# Patient Record
Sex: Male | Born: 1955 | Race: White | Hispanic: No | Marital: Married | Smoking: Former smoker
Health system: Southern US, Community
[De-identification: ages and names within clinical notes are randomized; demographics above are authoritative.]

## PROBLEM LIST (undated history)

## (undated) DIAGNOSIS — I2510 Atherosclerotic heart disease of native coronary artery without angina pectoris: Secondary | ICD-10-CM

## (undated) DIAGNOSIS — I447 Left bundle-branch block, unspecified: Secondary | ICD-10-CM

## (undated) DIAGNOSIS — E0590 Thyrotoxicosis, unspecified without thyrotoxic crisis or storm: Secondary | ICD-10-CM

## (undated) DIAGNOSIS — F419 Anxiety disorder, unspecified: Secondary | ICD-10-CM

## (undated) DIAGNOSIS — I4891 Unspecified atrial fibrillation: Secondary | ICD-10-CM

## (undated) DIAGNOSIS — I5020 Unspecified systolic (congestive) heart failure: Secondary | ICD-10-CM

## (undated) DIAGNOSIS — I10 Essential (primary) hypertension: Secondary | ICD-10-CM

## (undated) DIAGNOSIS — E785 Hyperlipidemia, unspecified: Secondary | ICD-10-CM

## (undated) HISTORY — DX: Thyrotoxicosis, unspecified without thyrotoxic crisis or storm: E05.90

## (undated) HISTORY — DX: Left bundle-branch block, unspecified: I44.7

## (undated) HISTORY — DX: Unspecified systolic (congestive) heart failure: I50.20

## (undated) HISTORY — DX: Atherosclerotic heart disease of native coronary artery without angina pectoris: I25.10

---

## 1978-04-02 HISTORY — PX: FACIAL FRACTURE SURGERY: SHX1570

## 2016-02-07 DIAGNOSIS — H5022 Vertical strabismus, left eye: Secondary | ICD-10-CM | POA: Insufficient documentation

## 2016-02-07 DIAGNOSIS — H5015 Alternating exotropia: Secondary | ICD-10-CM | POA: Insufficient documentation

## 2016-02-07 DIAGNOSIS — H532 Diplopia: Secondary | ICD-10-CM | POA: Insufficient documentation

## 2016-11-26 ENCOUNTER — Ambulatory Visit (INDEPENDENT_AMBULATORY_CARE_PROVIDER_SITE_OTHER): Payer: 59 | Admitting: Podiatry

## 2016-11-26 ENCOUNTER — Ambulatory Visit (INDEPENDENT_AMBULATORY_CARE_PROVIDER_SITE_OTHER): Payer: 59

## 2016-11-26 ENCOUNTER — Encounter: Payer: Self-pay | Admitting: Podiatry

## 2016-11-26 DIAGNOSIS — M779 Enthesopathy, unspecified: Secondary | ICD-10-CM

## 2016-11-26 DIAGNOSIS — F419 Anxiety disorder, unspecified: Secondary | ICD-10-CM | POA: Insufficient documentation

## 2016-11-26 DIAGNOSIS — M7661 Achilles tendinitis, right leg: Secondary | ICD-10-CM

## 2016-11-26 MED ORDER — MELOXICAM 15 MG PO TABS: 15.0000 mg | ORAL_TABLET | Freq: Every day | ORAL | 3 refills | Status: DC

## 2016-11-26 MED ORDER — METHYLPREDNISOLONE 4 MG PO TBPK: ORAL_TABLET | ORAL | 0 refills | Status: DC

## 2016-11-26 NOTE — Progress Notes (Signed)
   Subjective:    Patient ID: Tommy Rowe, male    DOB: [birth year], 61 y.o.   MRN: [id number]  HPI he presents today with a chief complaint of a painful Achilles tendon for about 4 months. He states that this Achilles tendon on the right side has been painful with a small nodule for about 4 months he's noticed swelling. He states that he is still active with golf and daily activities he feels tendon feels tight and stiff particularly in the mornings or after his been sitting for wall. He's tried heat and ice and KT tape strips.    Review of Systems  All other systems reviewed and are negative.      Objective:   Physical Exam: Vital signs are stable alert and oriented 3. Pulses are palpable. Neurologic sensorium is intact. Deep tendon reflexes are intact. Muscle strength 5 over 5 dorsiflexion plantar flexors and inverters and everters OF the musculature is intact. Orthopedic evaluation was resulted joints distal to the ankle range of motion without crepitation. Cutaneous evaluation of straight supple well-hydrated cutis no open lesions or wounds. He does have a palpable nonpulsatile nodule to the Achilles tendon in the watershed area. Radiographs do demonstrate retrocalcaneal heel spur thickening of the Achilles tendon even distal to the nodule. The tendon appears to be tender. More than likely this is a chronic tendinitis that has resulted in an interstitial tear.        Assessment & Plan:  Probable interstitial. Tear of chronic Achilles tendinitis right.  Plan: Start him on a Medrol Dosepak to be followed by meloxicam. Also placed him in a Cam Walker and suggested ice therapy. Follow up with him in 1 month they need to consider topical anti-inflammatory. Also dispensed night splint.

## 2017-01-07 ENCOUNTER — Encounter (INDEPENDENT_AMBULATORY_CARE_PROVIDER_SITE_OTHER): Payer: 59 | Admitting: Podiatry

## 2017-01-07 NOTE — Progress Notes (Signed)
This encounter was created in error - please disregard.

## 2019-08-13 ENCOUNTER — Other Ambulatory Visit: Payer: Self-pay | Admitting: Neurosurgery

## 2019-08-18 ENCOUNTER — Other Ambulatory Visit: Payer: Self-pay

## 2019-08-18 ENCOUNTER — Encounter
Admission: RE | Admit: 2019-08-18 | Discharge: 2019-08-18 | Disposition: A | Discharge status: Home / Self Care | Payer: Managed Care, Other (non HMO) | Source: Ambulatory Visit | Attending: Neurosurgery | Admitting: Neurosurgery

## 2019-08-18 DIAGNOSIS — Z01818 Encounter for other preprocedural examination: Secondary | ICD-10-CM | POA: Diagnosis not present

## 2019-08-18 HISTORY — DX: Anxiety disorder, unspecified: F41.9

## 2019-08-18 NOTE — Patient Instructions (Signed)
Your procedure is scheduled on: Wednesday Aug 26, 2019 Report to Day Surgery. To find out your arrival time please call [phone] between 1PM - 3PM on Tuesday Aug 25, 2019.  Remember: Instructions that are not followed completely may result in serious medical risk,  up to and including death, or upon the discretion of your surgeon and anesthesiologist your  surgery may need to be rescheduled.     _X__ 1. Do not eat food after midnight the night before your procedure.                 No gum chewing or hard candies. You may drink clear liquids up to 2 hours                 before you are scheduled to arrive for your surgery- DO not drink clear                 liquids within 2 hours of the start of your surgery.                 Clear Liquids include:  water, apple juice without pulp, clear Gatorade, G2 or                  Gatorade Zero (avoid Red/Purple/Blue), Black Coffee or Tea (Do not add                 anything to coffee or tea).  __X__2.  On the morning of surgery brush your teeth with toothpaste and water, you                may rinse your mouth with mouthwash if you wish.  Do not swallow any toothpaste of mouthwash.     _X__ 3.  No Alcohol for 24 hours before or after surgery.   _X__ 4.  Do Not Smoke or use e-cigarettes For 24 Hours Prior to Your Surgery.                 Do not use any chewable tobacco products for at least 6 hours prior to                 Surgery.  _X__  5.  Do not use any recreational drugs (marijuana, cocaine, heroin, ecstacy, MDMA or other)                For at least one week prior to your surgery.  Combination of these drugs with anesthesia                May have life threatening results.  __X__ 6.  Notify your doctor if there is any change in your medical condition      (cold, fever, infections).     Do not wear jewelry, make-up, hairpins, clips or nail polish. Do not wear lotions, powders, or perfumes. You may wear  deodorant. Do not shave 48 hours prior to surgery. Men may shave face and neck. Do not bring valuables to the hospital.    [facility] is not responsible for any belongings or valuables.  Contacts, dentures or bridgework may not be worn into surgery. Leave your suitcase in the car. After surgery it may be brought to your room. For patients admitted to the hospital, discharge time is determined by your treatment team.   Patients discharged the day of surgery will not be allowed to drive home.   Make arrangements for someone to be with you for the first  24 hours of your Same Day Discharge.    Please read over the following fact sheets that you were given:   Spine Surgery Patient Guide    __X__ Take these medicines the morning of surgery with A SIP OF WATER:    1. escitalopram (LEXAPRO) 20 MG  __X__ Use CHG Soap (or wipes) as directed  __X__ Stop aspirin on 08/18/2019  __X__ Stop Anti-inflammatories naproxen sodium (ALEVE)   __X__ Stop supplements until after surgery.    __X__ Do not start any herbal supplements before your surgery.

## 2019-08-21 ENCOUNTER — Other Ambulatory Visit: Payer: Self-pay

## 2019-08-21 ENCOUNTER — Encounter
Admission: RE | Admit: 2019-08-21 | Discharge: 2019-08-21 | Disposition: A | Discharge status: Home / Self Care | Payer: Managed Care, Other (non HMO) | Source: Ambulatory Visit | Attending: Neurosurgery | Admitting: Neurosurgery

## 2019-08-21 DIAGNOSIS — Z01818 Encounter for other preprocedural examination: Secondary | ICD-10-CM | POA: Diagnosis not present

## 2019-08-21 LAB — CBC
HCT: 43 % (ref 39.0–52.0)
Hemoglobin: 15.1 g/dL (ref 13.0–17.0)
MCH: 31.2 pg (ref 26.0–34.0)
MCHC: 35.1 g/dL (ref 30.0–36.0)
MCV: 88.8 fL (ref 80.0–100.0)
Platelets: 170 10*3/uL (ref 150–400)
RBC: 4.84 MIL/uL (ref 4.22–5.81)
RDW: 12.4 % (ref 11.5–15.5)
WBC: 4.8 10*3/uL (ref 4.0–10.5)
nRBC: 0 % (ref 0.0–0.2)

## 2019-08-21 LAB — URINALYSIS, ROUTINE W REFLEX MICROSCOPIC
Bilirubin Urine: NEGATIVE
Glucose, UA: NEGATIVE mg/dL
Hgb urine dipstick: NEGATIVE
Ketones, ur: NEGATIVE mg/dL
Leukocytes,Ua: NEGATIVE
Nitrite: NEGATIVE
Protein, ur: NEGATIVE mg/dL
Specific Gravity, Urine: 1.031 — ABNORMAL HIGH (ref 1.005–1.030)
pH: 5 (ref 5.0–8.0)

## 2019-08-21 LAB — PROTIME-INR
INR: 1 (ref 0.8–1.2)
Prothrombin Time: 12.6 seconds (ref 11.4–15.2)

## 2019-08-21 LAB — BASIC METABOLIC PANEL
Anion gap: 7 (ref 5–15)
BUN: 32 mg/dL — ABNORMAL HIGH (ref 8–23)
CO2: 28 mmol/L (ref 22–32)
Calcium: 8.9 mg/dL (ref 8.9–10.3)
Chloride: 105 mmol/L (ref 98–111)
Creatinine, Ser: 0.62 mg/dL (ref 0.61–1.24)
GFR calc Af Amer: >60 mL/min (ref >60)
GFR calc non Af Amer: >60 mL/min (ref >60)
Glucose, Bld: 113 mg/dL — ABNORMAL HIGH (ref 70–99)
Potassium: 3.8 mmol/L (ref 3.5–5.1)
Sodium: 140 mmol/L (ref 135–145)

## 2019-08-21 LAB — TYPE AND SCREEN
ABO/RH(D): A NEG
Antibody Screen: NEGATIVE

## 2019-08-21 LAB — SURGICAL PCR SCREEN
MRSA, PCR: POSITIVE — AB
Staphylococcus aureus: POSITIVE — AB

## 2019-08-21 LAB — APTT: aPTT: 28 seconds (ref 24–36)

## 2019-08-24 ENCOUNTER — Other Ambulatory Visit: Payer: Self-pay

## 2019-08-24 ENCOUNTER — Other Ambulatory Visit
Admission: RE | Admit: 2019-08-24 | Discharge: 2019-08-24 | Disposition: A | Discharge status: Home / Self Care | Payer: Managed Care, Other (non HMO) | Source: Ambulatory Visit | Attending: Neurosurgery | Admitting: Neurosurgery

## 2019-08-24 DIAGNOSIS — Z20822 Contact with and (suspected) exposure to covid-19: Secondary | ICD-10-CM | POA: Insufficient documentation

## 2019-08-24 DIAGNOSIS — Z01812 Encounter for preprocedural laboratory examination: Secondary | ICD-10-CM | POA: Insufficient documentation

## 2019-08-24 LAB — SARS CORONAVIRUS 2 (TAT 6-24 HRS): SARS Coronavirus 2: NEGATIVE

## 2019-08-25 ENCOUNTER — Encounter: Payer: Self-pay | Admitting: Neurosurgery

## 2019-08-26 ENCOUNTER — Other Ambulatory Visit: Payer: Self-pay

## 2019-08-26 ENCOUNTER — Observation Stay
Admission: RE | Admit: 2019-08-26 | Discharge: 2019-08-27 | Disposition: A | Discharge status: Home / Self Care | Payer: Managed Care, Other (non HMO) | Attending: Neurosurgery | Admitting: Neurosurgery

## 2019-08-26 ENCOUNTER — Ambulatory Visit: Payer: Managed Care, Other (non HMO) | Admitting: Anesthesiology

## 2019-08-26 ENCOUNTER — Encounter: Payer: Self-pay | Admitting: Neurosurgery

## 2019-08-26 ENCOUNTER — Encounter
Admission: RE | Disposition: A | Discharge status: Home / Self Care | Payer: Self-pay | Source: Home / Self Care | Attending: Neurosurgery

## 2019-08-26 ENCOUNTER — Ambulatory Visit: Payer: Managed Care, Other (non HMO)

## 2019-08-26 DIAGNOSIS — I447 Left bundle-branch block, unspecified: Secondary | ICD-10-CM | POA: Insufficient documentation

## 2019-08-26 DIAGNOSIS — Z7982 Long term (current) use of aspirin: Secondary | ICD-10-CM | POA: Insufficient documentation

## 2019-08-26 DIAGNOSIS — Z79899 Other long term (current) drug therapy: Secondary | ICD-10-CM | POA: Insufficient documentation

## 2019-08-26 DIAGNOSIS — M1990 Unspecified osteoarthritis, unspecified site: Secondary | ICD-10-CM | POA: Insufficient documentation

## 2019-08-26 DIAGNOSIS — M5416 Radiculopathy, lumbar region: Secondary | ICD-10-CM | POA: Diagnosis present

## 2019-08-26 DIAGNOSIS — Z419 Encounter for procedure for purposes other than remedying health state, unspecified: Secondary | ICD-10-CM

## 2019-08-26 DIAGNOSIS — M5116 Intervertebral disc disorders with radiculopathy, lumbar region: Principal | ICD-10-CM | POA: Insufficient documentation

## 2019-08-26 DIAGNOSIS — F419 Anxiety disorder, unspecified: Secondary | ICD-10-CM | POA: Insufficient documentation

## 2019-08-26 HISTORY — PX: LUMBAR LAMINECTOMY/DECOMPRESSION MICRODISCECTOMY: SHX5026

## 2019-08-26 LAB — ABO/RH: ABO/RH(D): A NEG

## 2019-08-26 SURGERY — LUMBAR LAMINECTOMY/DECOMPRESSION MICRODISCECTOMY 1 LEVEL
Anesthesia: General

## 2019-08-26 MED ORDER — MIDAZOLAM HCL 2 MG/2ML IJ SOLN
INTRAMUSCULAR | Status: DC | PRN
  Administered 2019-08-26: 2 mg via INTRAVENOUS

## 2019-08-26 MED ORDER — SUCCINYLCHOLINE CHLORIDE 20 MG/ML IJ SOLN
INTRAMUSCULAR | Status: DC | PRN
  Administered 2019-08-26: 120 mg via INTRAVENOUS

## 2019-08-26 MED ORDER — THROMBIN 5000 UNITS EX SOLR
CUTANEOUS | Status: DC | PRN
  Administered 2019-08-26: 5000 [IU] via TOPICAL

## 2019-08-26 MED ORDER — ONDANSETRON HCL 4 MG/2ML IJ SOLN: 4.0000 mg | Freq: Four times a day (QID) | INTRAMUSCULAR | Status: DC | PRN

## 2019-08-26 MED ORDER — ONDANSETRON HCL 4 MG PO TABS: 4.0000 mg | ORAL_TABLET | Freq: Four times a day (QID) | ORAL | Status: DC | PRN

## 2019-08-26 MED ORDER — SUCCINYLCHOLINE CHLORIDE 200 MG/10ML IV SOSY
PREFILLED_SYRINGE | INTRAVENOUS | Status: AC
  Filled 2019-08-26: qty 10

## 2019-08-26 MED ORDER — SODIUM CHLORIDE 0.9 % IV SOLN
INTRAVENOUS | Status: DC | PRN
  Administered 2019-08-26: 30 ug/min via INTRAVENOUS

## 2019-08-26 MED ORDER — SODIUM CHLORIDE 0.9 % IV SOLN
INTRAVENOUS | Status: DC | PRN
  Administered 2019-08-26: 40 mL

## 2019-08-26 MED ORDER — ONDANSETRON HCL 4 MG/2ML IJ SOLN: 4.0000 mg | Freq: Once | INTRAMUSCULAR | Status: DC | PRN

## 2019-08-26 MED ORDER — SODIUM CHLORIDE FLUSH 0.9 % IV SOLN
INTRAVENOUS | Status: AC
  Filled 2019-08-26: qty 30

## 2019-08-26 MED ORDER — ACETAMINOPHEN 10 MG/ML IV SOLN
INTRAVENOUS | Status: DC | PRN
  Administered 2019-08-26: 1000 mg via INTRAVENOUS

## 2019-08-26 MED ORDER — BUPIVACAINE-EPINEPHRINE (PF) 0.5% -1:200000 IJ SOLN
INTRAMUSCULAR | Status: DC | PRN
  Administered 2019-08-26: 4 mL

## 2019-08-26 MED ORDER — HYDROMORPHONE HCL 1 MG/ML IJ SOLN
INTRAMUSCULAR | Status: AC
  Filled 2019-08-26: qty 0.5

## 2019-08-26 MED ORDER — BACITRACIN 50000 UNITS IM SOLR
INTRAMUSCULAR | Status: AC
  Filled 2019-08-26: qty 1

## 2019-08-26 MED ORDER — LIDOCAINE HCL (PF) 2 % IJ SOLN
INTRAMUSCULAR | Status: AC
  Filled 2019-08-26: qty 10

## 2019-08-26 MED ORDER — FENTANYL CITRATE (PF) 100 MCG/2ML IJ SOLN
INTRAMUSCULAR | Status: AC
  Filled 2019-08-26: qty 2

## 2019-08-26 MED ORDER — METHYLPREDNISOLONE ACETATE 40 MG/ML IJ SUSP
INTRAMUSCULAR | Status: DC | PRN
  Administered 2019-08-26: 40 mg

## 2019-08-26 MED ORDER — BUPIVACAINE HCL 0.5 % IJ SOLN
INTRAMUSCULAR | Status: DC | PRN
  Administered 2019-08-26: 20 mL

## 2019-08-26 MED ORDER — SENNA 8.6 MG PO TABS
1.0000 | ORAL_TABLET | Freq: Two times a day (BID) | ORAL | Status: DC
  Filled 2019-08-26 (×3): qty 1

## 2019-08-26 MED ORDER — METHOCARBAMOL 500 MG PO TABS: 500.0000 mg | ORAL_TABLET | Freq: Four times a day (QID) | ORAL | Status: DC | PRN

## 2019-08-26 MED ORDER — MENTHOL 3 MG MT LOZG
1.0000 | LOZENGE | OROMUCOSAL | Status: DC | PRN
  Filled 2019-08-26: qty 9

## 2019-08-26 MED ORDER — REMIFENTANIL HCL 1 MG IV SOLR
INTRAVENOUS | Status: DC | PRN
  Administered 2019-08-26: .1 ug/kg/min via INTRAVENOUS

## 2019-08-26 MED ORDER — FENTANYL CITRATE (PF) 100 MCG/2ML IJ SOLN
INTRAMUSCULAR | Status: DC | PRN
  Administered 2019-08-26 (×2): 50 ug via INTRAVENOUS
  Administered 2019-08-26: 25 ug via INTRAVENOUS
  Administered 2019-08-26: 50 ug via INTRAVENOUS

## 2019-08-26 MED ORDER — ALPRAZOLAM 0.5 MG PO TABS: 0.5000 mg | ORAL_TABLET | Freq: Every evening | ORAL | Status: DC | PRN

## 2019-08-26 MED ORDER — SODIUM CHLORIDE 0.9 % IV SOLN
250.0000 mL | INTRAVENOUS | Status: DC
  Administered 2019-08-26: 250 mL via INTRAVENOUS

## 2019-08-26 MED ORDER — REMIFENTANIL HCL 1 MG IV SOLR
INTRAVENOUS | Status: AC
  Filled 2019-08-26: qty 1000

## 2019-08-26 MED ORDER — METHOCARBAMOL 1000 MG/10ML IJ SOLN
500.0000 mg | Freq: Four times a day (QID) | INTRAVENOUS | Status: DC | PRN
  Filled 2019-08-26: qty 5

## 2019-08-26 MED ORDER — SODIUM CHLORIDE 0.9 % IR SOLN
Status: DC | PRN
  Administered 2019-08-26: 1000 mL

## 2019-08-26 MED ORDER — HYDROCODONE-ACETAMINOPHEN 5-325 MG PO TABS
ORAL_TABLET | ORAL | Status: AC
  Filled 2019-08-26: qty 2

## 2019-08-26 MED ORDER — DEXAMETHASONE SODIUM PHOSPHATE 10 MG/ML IJ SOLN
INTRAMUSCULAR | Status: AC
  Filled 2019-08-26: qty 2

## 2019-08-26 MED ORDER — SODIUM CHLORIDE (PF) 0.9 % IJ SOLN
INTRAMUSCULAR | Status: AC
  Filled 2019-08-26: qty 20

## 2019-08-26 MED ORDER — VANCOMYCIN HCL IN DEXTROSE 1-5 GM/200ML-% IV SOLN: 1000.0000 mg | INTRAVENOUS | Status: AC

## 2019-08-26 MED ORDER — FENTANYL CITRATE (PF) 100 MCG/2ML IJ SOLN: 25.0000 ug | INTRAMUSCULAR | Status: DC | PRN

## 2019-08-26 MED ORDER — BUPIVACAINE HCL (PF) 0.5 % IJ SOLN
INTRAMUSCULAR | Status: AC
  Filled 2019-08-26: qty 60

## 2019-08-26 MED ORDER — ONDANSETRON HCL 4 MG/2ML IJ SOLN
INTRAMUSCULAR | Status: DC | PRN
  Administered 2019-08-26: 4 mg via INTRAVENOUS

## 2019-08-26 MED ORDER — PHENOL 1.4 % MT LIQD
1.0000 | OROMUCOSAL | Status: DC | PRN
  Filled 2019-08-26: qty 177

## 2019-08-26 MED ORDER — DEXAMETHASONE SODIUM PHOSPHATE 10 MG/ML IJ SOLN
INTRAMUSCULAR | Status: DC | PRN
  Administered 2019-08-26: 10 mg via INTRAVENOUS

## 2019-08-26 MED ORDER — FAMOTIDINE 20 MG PO TABS: 20.0000 mg | ORAL_TABLET | Freq: Once | ORAL | Status: AC

## 2019-08-26 MED ORDER — MIDAZOLAM HCL 2 MG/2ML IJ SOLN
INTRAMUSCULAR | Status: AC
  Filled 2019-08-26: qty 2

## 2019-08-26 MED ORDER — FIBRIN SEALANT 2 ML SINGLE DOSE KIT
PACK | CUTANEOUS | Status: DC | PRN
  Administered 2019-08-26: 2 mL via TOPICAL

## 2019-08-26 MED ORDER — EPHEDRINE SULFATE 50 MG/ML IJ SOLN
INTRAMUSCULAR | Status: DC | PRN
  Administered 2019-08-26: 10 mg via INTRAVENOUS
  Administered 2019-08-26 (×5): 5 mg via INTRAVENOUS

## 2019-08-26 MED ORDER — BUPIVACAINE-EPINEPHRINE (PF) 0.5% -1:200000 IJ SOLN
INTRAMUSCULAR | Status: AC
  Filled 2019-08-26: qty 60

## 2019-08-26 MED ORDER — METHYLPREDNISOLONE ACETATE 40 MG/ML IJ SUSP
INTRAMUSCULAR | Status: AC
  Filled 2019-08-26: qty 1

## 2019-08-26 MED ORDER — ACETAMINOPHEN 325 MG PO TABS: 650.0000 mg | ORAL_TABLET | ORAL | Status: DC | PRN

## 2019-08-26 MED ORDER — ONDANSETRON HCL 4 MG/2ML IJ SOLN
INTRAMUSCULAR | Status: AC
  Filled 2019-08-26: qty 4

## 2019-08-26 MED ORDER — ACETAMINOPHEN 325 MG PO TABS: 325.0000 mg | ORAL_TABLET | ORAL | Status: DC | PRN

## 2019-08-26 MED ORDER — VANCOMYCIN HCL IN DEXTROSE 1-5 GM/200ML-% IV SOLN
INTRAVENOUS | Status: AC
  Administered 2019-08-26: 1000 mg via INTRAVENOUS
  Filled 2019-08-26: qty 200

## 2019-08-26 MED ORDER — SODIUM CHLORIDE 0.9% FLUSH
3.0000 mL | Freq: Two times a day (BID) | INTRAVENOUS | Status: DC
  Administered 2019-08-26 – 2019-08-27 (×2): 3 mL via INTRAVENOUS

## 2019-08-26 MED ORDER — FAMOTIDINE 20 MG PO TABS
ORAL_TABLET | ORAL | Status: AC
  Administered 2019-08-26: 20 mg via ORAL
  Filled 2019-08-26: qty 1

## 2019-08-26 MED ORDER — POLYETHYLENE GLYCOL 3350 17 G PO PACK
17.0000 g | PACK | Freq: Every day | ORAL | Status: DC | PRN
  Filled 2019-08-26: qty 1

## 2019-08-26 MED ORDER — EPINEPHRINE PF 1 MG/ML IJ SOLN
INTRAMUSCULAR | Status: AC
  Filled 2019-08-26: qty 1

## 2019-08-26 MED ORDER — LACTATED RINGERS IV SOLN: INTRAVENOUS | Status: DC

## 2019-08-26 MED ORDER — ACETAMINOPHEN 10 MG/ML IV SOLN
INTRAVENOUS | Status: AC
  Filled 2019-08-26: qty 100

## 2019-08-26 MED ORDER — ACETAMINOPHEN 650 MG RE SUPP: 650.0000 mg | RECTAL | Status: DC | PRN

## 2019-08-26 MED ORDER — BUPIVACAINE LIPOSOME 1.3 % IJ SUSP
INTRAMUSCULAR | Status: AC
  Filled 2019-08-26: qty 20

## 2019-08-26 MED ORDER — LIDOCAINE HCL (CARDIAC) PF 100 MG/5ML IV SOSY
PREFILLED_SYRINGE | INTRAVENOUS | Status: DC | PRN
  Administered 2019-08-26: 100 mg via INTRAVENOUS

## 2019-08-26 MED ORDER — OXYCODONE HCL 5 MG PO TABS: 5.0000 mg | ORAL_TABLET | ORAL | Status: DC | PRN

## 2019-08-26 MED ORDER — GLYCOPYRROLATE 0.2 MG/ML IJ SOLN
INTRAMUSCULAR | Status: DC | PRN
  Administered 2019-08-26: .1 mg via INTRAVENOUS

## 2019-08-26 MED ORDER — ESCITALOPRAM OXALATE 10 MG PO TABS
20.0000 mg | ORAL_TABLET | Freq: Every day | ORAL | Status: DC
  Administered 2019-08-27: 20 mg via ORAL
  Filled 2019-08-26 (×2): qty 2

## 2019-08-26 MED ORDER — SODIUM CHLORIDE 0.9% FLUSH: 3.0000 mL | INTRAVENOUS | Status: DC | PRN

## 2019-08-26 MED ORDER — PROPOFOL 10 MG/ML IV BOLUS
INTRAVENOUS | Status: AC
  Filled 2019-08-26: qty 20

## 2019-08-26 MED ORDER — GLYCOPYRROLATE 0.2 MG/ML IJ SOLN
INTRAMUSCULAR | Status: AC
  Filled 2019-08-26: qty 1

## 2019-08-26 MED ORDER — PROPOFOL 10 MG/ML IV BOLUS
INTRAVENOUS | Status: DC | PRN
  Administered 2019-08-26: 180 mg via INTRAVENOUS

## 2019-08-26 MED ORDER — OXYCODONE HCL 5 MG PO TABS: 10.0000 mg | ORAL_TABLET | ORAL | Status: DC | PRN

## 2019-08-26 MED ORDER — FLEET ENEMA 7-19 GM/118ML RE ENEM: 1.0000 | ENEMA | Freq: Once | RECTAL | Status: DC | PRN

## 2019-08-26 MED ORDER — HYDROMORPHONE HCL 1 MG/ML IJ SOLN
0.5000 mg | INTRAMUSCULAR | Status: DC | PRN
  Administered 2019-08-26 – 2019-08-27 (×2): 0.5 mg via INTRAVENOUS

## 2019-08-26 MED ORDER — HYDROCODONE-ACETAMINOPHEN 5-325 MG PO TABS
1.0000 | ORAL_TABLET | Freq: Four times a day (QID) | ORAL | Status: DC | PRN
  Administered 2019-08-26: 2 via ORAL

## 2019-08-26 MED ORDER — BISACODYL 10 MG RE SUPP
10.0000 mg | Freq: Every day | RECTAL | Status: DC | PRN
  Filled 2019-08-26: qty 1

## 2019-08-26 SURGICAL SUPPLY — 61 items
BUR NEURO DRILL SOFT 3.0X3.8M (BURR) ×3 IMPLANT
CANISTER SUCT 1200ML W/VALVE (MISCELLANEOUS) ×6 IMPLANT
CHLORAPREP W/TINT 26 (MISCELLANEOUS) ×6 IMPLANT
CNTNR SPEC 2.5X3XGRAD LEK (MISCELLANEOUS) ×1
CONT SPEC 4OZ STER OR WHT (MISCELLANEOUS) ×2
CONTAINER SPEC 2.5X3XGRAD LEK (MISCELLANEOUS) ×1 IMPLANT
COUNTER NEEDLE 20/40 LG (NEEDLE) ×3 IMPLANT
COVER LIGHT HANDLE STERIS (MISCELLANEOUS) ×6 IMPLANT
COVER WAND RF STERILE (DRAPES) ×3 IMPLANT
CUP MEDICINE 2OZ PLAST GRAD ST (MISCELLANEOUS) ×6 IMPLANT
DERMABOND ADVANCED (GAUZE/BANDAGES/DRESSINGS) ×2
DERMABOND ADVANCED .7 DNX12 (GAUZE/BANDAGES/DRESSINGS) ×1 IMPLANT
DRAPE C-ARM 42X72 X-RAY (DRAPES) ×6 IMPLANT
DRAPE LAPAROTOMY 100X77 ABD (DRAPES) ×3 IMPLANT
DRAPE MICROSCOPE SPINE 48X150 (DRAPES) ×3 IMPLANT
DRAPE SURG 17X11 SM STRL (DRAPES) ×12 IMPLANT
DRSG TEGADERM 4X4.75 (GAUZE/BANDAGES/DRESSINGS) ×3 IMPLANT
DRSG TELFA 4X3 1S NADH ST (GAUZE/BANDAGES/DRESSINGS) ×3 IMPLANT
ELECT CAUTERY BLADE TIP 2.5 (TIP) ×3
ELECT EZSTD 165MM 6.5IN (MISCELLANEOUS)
ELECT REM PT RETURN 9FT ADLT (ELECTROSURGICAL) ×3
ELECTRODE CAUTERY BLDE TIP 2.5 (TIP) ×1 IMPLANT
ELECTRODE EZSTD 165MM 6.5IN (MISCELLANEOUS) IMPLANT
ELECTRODE REM PT RTRN 9FT ADLT (ELECTROSURGICAL) ×1 IMPLANT
FRAME EYE SHIELD (PROTECTIVE WEAR) ×6 IMPLANT
GLOVE BIOGEL PI IND STRL 7.0 (GLOVE) ×1 IMPLANT
GLOVE BIOGEL PI INDICATOR 7.0 (GLOVE) ×2
GLOVE SURG SYN 7.0 (GLOVE) ×6 IMPLANT
GLOVE SURG SYN 8.5  E (GLOVE) ×6
GLOVE SURG SYN 8.5 E (GLOVE) ×3 IMPLANT
GOWN SRG XL LVL 3 NONREINFORCE (GOWNS) ×1 IMPLANT
GOWN STRL NON-REIN TWL XL LVL3 (GOWNS) ×2
GOWN STRL REUS W/ TWL XL LVL3 (GOWN DISPOSABLE) ×1 IMPLANT
GOWN STRL REUS W/TWL XL LVL3 (GOWN DISPOSABLE) ×2
GRADUATE 1200CC STRL 31836 (MISCELLANEOUS) ×3 IMPLANT
GRAFT DURAGEN MATRIX 1WX1L (Tissue) ×3 IMPLANT
KIT SPINAL PRONEVIEW (KITS) ×3 IMPLANT
KNIFE BAYONET SHORT DISCETOMY (MISCELLANEOUS) IMPLANT
MARKER SKIN DUAL TIP RULER LAB (MISCELLANEOUS) ×3 IMPLANT
NDL SAFETY ECLIPSE 18X1.5 (NEEDLE) ×1 IMPLANT
NEEDLE HYPO 18GX1.5 SHARP (NEEDLE) ×2
NEEDLE HYPO 22GX1.5 SAFETY (NEEDLE) ×3 IMPLANT
NS IRRIG 1000ML POUR BTL (IV SOLUTION) ×3 IMPLANT
PACK LAMINECTOMY NEURO (CUSTOM PROCEDURE TRAY) ×3 IMPLANT
PAD ARMBOARD 7.5X6 YLW CONV (MISCELLANEOUS) ×3 IMPLANT
SPOGE SURGIFLO 8M (HEMOSTASIS) ×2
SPONGE SURGIFLO 8M (HEMOSTASIS) ×1 IMPLANT
SUT DVC VLOC 3-0 CL 6 P-12 (SUTURE) ×3 IMPLANT
SUT ETHILON 3-0 FS-10 30 BLK (SUTURE) ×3
SUT VIC AB 0 CT1 27 (SUTURE) ×2
SUT VIC AB 0 CT1 27XCR 8 STRN (SUTURE) ×1 IMPLANT
SUT VIC AB 2-0 CT1 18 (SUTURE) ×3 IMPLANT
SUTURE EHLN 3-0 FS-10 30 BLK (SUTURE) ×1 IMPLANT
SYR 10ML LL (SYRINGE) ×3 IMPLANT
SYR 20ML LL LF (SYRINGE) ×3 IMPLANT
SYR 30ML LL (SYRINGE) ×6 IMPLANT
SYR 3ML LL SCALE MARK (SYRINGE) ×3 IMPLANT
TOWEL OR 17X26 4PK STRL BLUE (TOWEL DISPOSABLE) ×9 IMPLANT
TUBE METRX 18MMX5CM (INSTRUMENTS) ×3 IMPLANT
TUBING CONNECTING 10 (TUBING) ×2 IMPLANT
TUBING CONNECTING 10' (TUBING) ×1

## 2019-08-26 NOTE — Transfer of Care (Signed)
Immediate Anesthesia Transfer of Care Note  Patient: Tommy Rowe  Procedure(s) Performed: RIGHT L4-5 MICRODISCECTOMY (N/A )  Patient Location: PACU  Anesthesia Type:General  Level of Consciousness: sedated  Airway & Oxygen Therapy: Patient connected to face mask oxygen  Post-op Assessment: Post -op Vital signs reviewed and stable  Post vital signs: stable  Last Vitals:  Vitals Value Taken Time  BP 131/69 08/26/19 0937  Temp    Pulse 83 08/26/19 0939  Resp 11 08/26/19 0939  SpO2 95 % 08/26/19 0939  Vitals shown include unvalidated device data.  Last Pain:  Vitals:   08/26/19 0625  TempSrc: Oral  PainSc: 8          Complications: No apparent anesthesia complications

## 2019-08-26 NOTE — Anesthesia Preprocedure Evaluation (Signed)
Anesthesia Evaluation  Patient identified by MRN, date of birth, ID band Patient awake    Reviewed: Allergy & Precautions, NPO status , Patient's Chart, lab work & pertinent test results  Airway Mallampati: III       Dental  (+) Partial Upper, Partial Lower   Pulmonary neg pulmonary ROS,    Pulmonary exam normal        Cardiovascular negative cardio ROS Normal cardiovascular exam     Neuro/Psych Anxiety negative neurological ROS     GI/Hepatic negative GI ROS, Neg liver ROS,   Endo/Other  negative endocrine ROS  Renal/GU negative Renal ROS  negative genitourinary   Musculoskeletal  (+) Arthritis , Osteoarthritis,    Abdominal Normal abdominal exam  (+)   Peds negative pediatric ROS (+)  Hematology negative hematology ROS (+)   Anesthesia Other Findings   Reproductive/Obstetrics                             Anesthesia Physical Anesthesia Plan  ASA: II  Anesthesia Plan: General   Post-op Pain Management:    Induction: Intravenous  PONV Risk Score and Plan:   Airway Management Planned: Oral ETT  Additional Equipment:   Intra-op Plan:   Post-operative Plan: Extubation in OR  Informed Consent: I have reviewed the patients History and Physical, chart, labs and discussed the procedure including the risks, benefits and alternatives for the proposed anesthesia with the patient or authorized representative who has indicated his/her understanding and acceptance.     Dental advisory given  Plan Discussed with: CRNA and Surgeon  Anesthesia Plan Comments:         Anesthesia Quick Evaluation

## 2019-08-26 NOTE — Anesthesia Postprocedure Evaluation (Signed)
Anesthesia Post Note  Patient: Tommy Rowe  Procedure(s) Performed: RIGHT L4-5 MICRODISCECTOMY (N/A )  Patient location during evaluation: PACU Anesthesia Type: General Level of consciousness: awake and alert and oriented Pain management: pain level controlled Vital Signs Assessment: post-procedure vital signs reviewed and stable Respiratory status: spontaneous breathing Cardiovascular status: blood pressure returned to baseline Anesthetic complications: no Comments: With no prior EKG available and no cardiac symptoms, patient showed LBBB when hooked up to the monitor.  Patient has an excellent exercise history with no chest pain or shortness of breath.  Decided to proceed with the case and evaluate with cardiology afterwards.     Last Vitals:  Vitals:   08/26/19 1220 08/26/19 1538  BP: (!) 145/82 (!) 144/90  Pulse: 76 99  Resp: 18 18  Temp: 36.7 C (!) 36.4 C  SpO2: 95% 96%    Last Pain:  Vitals:   08/26/19 1538  TempSrc: Temporal  PainSc:                  [name]

## 2019-08-26 NOTE — Progress Notes (Signed)
Dr. Welton Flakes Cardiologist consulted with patient regarding EKG changes(bundle branch block). Dr. Noralyn Pick updated and spoke with patient. Patient is aware to follow up with cardiology and is planned for Echocardiogram. Pt is in no acute distress at this time, asymptomatic and Neuro Checks WDL. Wife at bedside. Will continue to monitor.

## 2019-08-26 NOTE — H&P (Signed)
I have reviewed and confirmed my history and physical from 08/06/2019 with no additions or changes. Plan for R L4-5 microdiscectomy.  Risks and benefits reviewed.  Heart sounds normal no MRG. Chest Clear to Auscultation Bilaterally.       

## 2019-08-26 NOTE — Plan of Care (Signed)
Pt progressing

## 2019-08-26 NOTE — Consult Note (Signed)
Tommy Rowe is a 64 y.o. male  [id number]  Primary Cardiologist: Adrian Blackwater Reason for Consultation: LBBB  HPI: Patient is a 64 year old male with a past medical history of anxiety and lumbar radiculopathy.  Patient denies any cardiac history.  Patient presented to [facility] today for a scheduled L4/5 microdiscectomy.  Patient then developed a left bundle branch block and we were consulted on the patient.   Review of Systems: Patient denies chest pain, shortness of breath, or dizziness.   Past Medical History:  Diagnosis Date  . Anxiety     Medications Prior to Admission  Medication Sig Dispense Refill  . ALPRAZolam (XANAX) 0.5 MG tablet Take 0.5 mg by mouth at bedtime as needed for anxiety.    Marland Kitchen aspirin EC 81 MG tablet Take 81 mg by mouth daily.    . clobetasol ointment (TEMOVATE) 0.05 % Apply 1 application topically daily as needed (dry skin).     Marland Kitchen escitalopram (LEXAPRO) 20 MG tablet Take 20 mg by mouth daily.   1  . HYDROcodone-acetaminophen (NORCO/VICODIN) 5-325 MG tablet Take 1 tablet by mouth 3 (three) times daily as needed for pain.    . Multiple Vitamin (MULTIVITAMIN WITH MINERALS) TABS tablet Take 1 tablet by mouth daily.    . naproxen sodium (ALEVE) 220 MG tablet Take 220 mg by mouth daily as needed (pain).    . Omega-3 Fatty Acids (FISH OIL) 1000 MG CAPS Take 1,000 mg by mouth daily.    . meloxicam (MOBIC) 15 MG tablet Take 1 tablet (15 mg total) by mouth daily. (Patient not taking: Reported on 08/14/2019) 30 tablet 3  . methylPREDNISolone (MEDROL DOSEPAK) 4 MG TBPK tablet 6 day dose pack - take as directed (Patient not taking: Reported on 08/14/2019) 21 tablet 0     . escitalopram  20 mg Oral Daily  . senna  1 tablet Oral BID  . sodium chloride flush  3 mL Intravenous Q12H    Infusions: . sodium chloride    . methocarbamol (ROBAXIN) IV      No Known Allergies  Social History   Socioeconomic History  . Marital status: Married    Spouse name: Not on file   . Number of children: Not on file  . Years of education: Not on file  . Highest education level: Not on file  Occupational History  . Not on file  Tobacco Use  . Smoking status: Never Smoker  . Smokeless tobacco: Never Used  Substance and Sexual Activity  . Alcohol use: No  . Drug use: Never  . Sexual activity: Not on file  Other Topics Concern  . Not on file  Social History Narrative  . Not on file   Social Determinants of Health   Financial Resource Strain:   . Difficulty of Paying Living Expenses:   Food Insecurity:   . Worried About Programme researcher, broadcasting/film/video in the Last Year:   . Barista in the Last Year:   Transportation Needs:   . Freight forwarder (Medical):   Marland Kitchen Lack of Transportation (Non-Medical):   Physical Activity:   . Days of Exercise per Week:   . Minutes of Exercise per Session:   Stress:   . Feeling of Stress :   Social Connections:   . Frequency of Communication with Friends and Family:   . Frequency of Social Gatherings with Friends and Family:   . Attends Religious Services:   . Active Member of Clubs or Organizations:   .  Attends Archivist Meetings:   Marland Kitchen Marital Status:   Intimate Partner Violence:   . Fear of Current or Ex-Partner:   . Emotionally Abused:   Marland Kitchen Physically Abused:   . Sexually Abused:     History reviewed. No pertinent family history.  PHYSICAL EXAM: Vitals:   08/26/19 1159 08/26/19 1220  BP: (!) 149/90 (!) 145/82  Pulse: 83 76  Resp: 18 18  Temp: (!) 97 F (36.1 C) 98 F (36.7 C)  SpO2: 94% 95%     Intake/Output Summary (Last 24 hours) at 08/26/2019 1252 Last data filed at 08/26/2019 0919 Gross per 24 hour  Intake --  Output 25 ml  Net -25 ml    General:  Well appearing. No respiratory difficulty HEENT: normal Neck: supple. no JVD. Carotids 2+ bilat; no bruits. No lymphadenopathy or thryomegaly appreciated. Cor: PMI nondisplaced. Regular rate & rhythm. No rubs, gallops or II/VI systolic  murmur Lungs: clear Abdomen: soft, nontender, nondistended. No hepatosplenomegaly. No bruits or masses. Good bowel sounds. Extremities: no cyanosis, clubbing, rash, edema Neuro: alert & oriented x 3, cranial nerves grossly intact. moves all 4 extremities w/o difficulty. Affect pleasant.  ECG: Normal sinus rhythm with left bundle branch block.  89/BPM  Results for orders placed or performed during the hospital encounter of 08/26/19 (from the past 24 hour(s))  ABO/Rh     Status: None   Collection Time: 08/26/19  6:45 AM  Result Value Ref Range   ABO/RH(D)      A NEG Performed at [facility], [address] [place], [place] [id number]    DG Lumbar Spine 2-3 Views  Result Date: 08/26/2019 CLINICAL DATA:  L4-5 discectomy EXAM: LUMBAR SPINE - 2-3 VIEW; DG C-ARM 1-60 MIN COMPARISON:  None. FLUOROSCOPY TIME:  Radiation Exposure Index (as provided by the fluoroscopic device): 2.24 mGy If the device does not provide the exposure index: Fluoroscopy Time:  3 seconds Number of Acquired Images:  3 FINDINGS: Initial images demonstrate a surgical instrument at the skin surface just above the L4-5 interspace. Subsequent advancement of instruments towards the L4-5 disc space is seen. IMPRESSION: Intraoperative localization for L4-5 discectomy. Electronically Signed   By: Inez Catalina M.D.   On: 08/26/2019 08:39   DG C-Arm 1-60 Min  Result Date: 08/26/2019 CLINICAL DATA:  L4-5 discectomy EXAM: LUMBAR SPINE - 2-3 VIEW; DG C-ARM 1-60 MIN COMPARISON:  None. FLUOROSCOPY TIME:  Radiation Exposure Index (as provided by the fluoroscopic device): 2.24 mGy If the device does not provide the exposure index: Fluoroscopy Time:  3 seconds Number of Acquired Images:  3 FINDINGS: Initial images demonstrate a surgical instrument at the skin surface just above the L4-5 interspace. Subsequent advancement of instruments towards the L4-5 disc space is seen. IMPRESSION: Intraoperative localization for L4-5 discectomy.  Electronically Signed   By: Inez Catalina M.D.   On: 08/26/2019 08:39     ASSESSMENT AND PLAN: Patient presenting to [facility] with scheduled neurosurgery which developed a new left bundle branch block.  On exam the patient is asymptomatic.  In the setting of a new bundle branch block and the patient spending the night at the hospital, we will plan on performing an echocardiogram prior to discharge.  Patient will then be worked up in the outpatient setting next week as he is asymptomatic and normotensive.  Please do not hesitate to reach out if patient does develop symptoms.  Adaline Sill NP-C

## 2019-08-26 NOTE — Anesthesia Procedure Notes (Signed)
Procedure Name: Intubation Date/Time: 08/26/2019 7:29 AM Performed by: Joanette Gula, Summer, RN Pre-anesthesia Checklist: Patient identified, Emergency Drugs available, Suction available and Patient being monitored Patient Re-evaluated:Patient Re-evaluated prior to induction Oxygen Delivery Method: Circle system utilized Preoxygenation: Pre-oxygenation with 100% oxygen Induction Type: IV induction Ventilation: Mask ventilation without difficulty Laryngoscope Size: McGraph and 4 Grade View: Grade I Tube type: Oral Tube size: 7.5 mm Number of attempts: 1 Airway Equipment and Method: Stylet Placement Confirmation: ETT inserted through vocal cords under direct vision,  positive ETCO2 and breath sounds checked- equal and bilateral Secured at: 21 cm Tube secured with: Tape Dental Injury: Teeth and Oropharynx as per pre-operative assessment

## 2019-08-26 NOTE — Op Note (Signed)
Indications: [name] is a 64 yo male who presented with lumbar radiculopathy.  He had worsening symptoms after his injection, so went for surgical intervention.  Findings: intradural disc herniation  Preoperative Diagnosis: Lumbar radiculopathy Postoperative Diagnosis: same   EBL: 25 ml IVF: 400 ml Drains: none Disposition: Extubated and Stable to PACU Complications: none  No foley catheter was placed.   Preoperative Note:   Risks of surgery discussed include: infection, bleeding, stroke, coma, death, paralysis, CSF leak, nerve/spinal cord injury, numbness, tingling, weakness, complex regional pain syndrome, recurrent stenosis and/or disc herniation, vascular injury, development of instability, neck/back pain, need for further surgery, persistent symptoms, development of deformity, and the risks of anesthesia. The patient understood these risks and agreed to proceed.  Operative Note:   1) right L4/5 microdiscectomy  The patient was then brought from the preoperative center with intravenous access established.  The patient underwent general anesthesia and endotracheal tube intubation, and was then rotated on the [place] rail top where all pressure points were appropriately padded.  The skin was then thoroughly cleansed.  Perioperative antibiotic prophylaxis was administered.  Sterile prep and drapes were then applied and a timeout was then observed.  C-arm was brought into the field under sterile conditions, and the L4-5 disc space identified and marked with an incision on the right 1cm lateral to midline.  Once this was complete a 2 cm incision was opened with the use of a #10 blade knife.  The Metrx tubes were sequentially advanced under lateral fluoroscopy until a 18 x 50 mm Metrx tube was placed over the facet and lamina and secured to the bed.    The microscope was then sterilely brought into the field and muscle creep was hemostased with a bipolar and resected with a pituitary  rongeur.  A Bovie extender was then used to expose the spinous process and lamina.  Careful attention was placed to not violate the facet capsule. A 3 mm matchstick drill bit was then used to make a hemi-laminotomy trough until the ligamentum flavum was exposed.  This was extended to the base of the spinous process.  Once this was complete and the underlying ligamentum flavum was visualized this was dissected with an up angle curette and resected with a #2 and #3 mm biting Kerrison.  The laminotomy opening was also expanded in similar fashion and hemostasis was obtained with Surgifoam and a patty as well as bone wax.  The rostral aspect of the caudal level of the lamina was also resected with a #2 biting Kerrison effort to further enhance exposure.  Once the underlying dura was visualized a Penfield 4 was then used to dissect and expose the traversing nerve root.  The disc herniation was noted just superior to the takeoff of the right L5 nerve root.  The disc herniation had eroded through the dura at this location.  Once this was identified a nerve root retractor suction was used to mobilize this medially and prevent increase in size of the durotomy.  The venous plexus was hemostased with Surgifoam and light bipolar use.  Disc herniation contents were noted in the epidural space.  The annulotomy was still present.  The disc herniation was identified and dissected free using a balltip probe. The pituitary rongeur was used to remove the extruded disc fragments. Once the thecal sac and nerve root were noted to be relaxed and under less tension the ball-tipped feeler was passed along the foramen distally to to ensure no residual compression was noted.  Because of the size of the erosion, no direct repair was possible.  Duragen and tisseel were used to augment the dura.    Depo-Medrol was placed along the nerve root.  The area was irrigated. The tube system was then removed under microscopic visualization and  hemostasis was obtained with a bipolar.    The fascial layer was reapproximated with the use of a 0- Vicryl suture.  Subcutaneous tissue layer was reapproximated using 2-0 Vicryl suture.  3-0 nylon was used in running fashion on the skin.  Patient was then rotated back to the preoperative bed awakened from anesthesia and taken to recovery all counts are correct in this case.   I performed the entire procedure with the assistance of Patsey Berthold NP as an Designer, television/film set.  Venetia Night MD  Please note that the durotomy was present at the time of surgery due to intradural disc herniation.

## 2019-08-27 ENCOUNTER — Observation Stay
Admission: RE | Admit: 2019-08-27 | Discharge: 2019-08-27 | Disposition: A | Discharge status: Home / Self Care | Payer: Managed Care, Other (non HMO) | Source: Home / Self Care | Attending: Nurse Practitioner | Admitting: Nurse Practitioner

## 2019-08-27 DIAGNOSIS — M5116 Intervertebral disc disorders with radiculopathy, lumbar region: Secondary | ICD-10-CM | POA: Diagnosis not present

## 2019-08-27 LAB — ECHOCARDIOGRAM COMPLETE
Height: 72 in
Weight: 3350.99 oz

## 2019-08-27 MED ORDER — METHOCARBAMOL 500 MG PO TABS: 500.0000 mg | ORAL_TABLET | Freq: Four times a day (QID) | ORAL | 0 refills | Status: AC | PRN

## 2019-08-27 MED ORDER — SENNA 8.6 MG PO TABS: 1.0000 | ORAL_TABLET | Freq: Two times a day (BID) | ORAL | 0 refills | Status: DC

## 2019-08-27 MED ORDER — HYDROCODONE-ACETAMINOPHEN 5-325 MG PO TABS: 1.0000 | ORAL_TABLET | Freq: Four times a day (QID) | ORAL | 0 refills | Status: DC | PRN

## 2019-08-27 MED ORDER — POLYETHYLENE GLYCOL 3350 17 G PO PACK: 17.0000 g | PACK | Freq: Every day | ORAL | 0 refills | Status: DC | PRN

## 2019-08-27 MED ORDER — HYDROMORPHONE HCL 1 MG/ML IJ SOLN
INTRAMUSCULAR | Status: AC
  Filled 2019-08-27: qty 0.5

## 2019-08-27 NOTE — Discharge Summary (Signed)
  Procedure: R L4-L5 microdiscectomy Procedure date: 08/26/2019 Diagnosis: Lumbar radiculopathy   History: Tommy Rowe is s/p R L4-L5 microdiscectomy POD1: Tommy Rowe was seen this afternoon after liberalizing the head of bed restrictions.  He has been ambulating without complaint, without headache, without drainage from his incision.  He has been tolerating a diet and urinating.  Physical Exam: Vitals:   08/27/19 1003 08/27/19 1347  BP: (!) 144/82 112/68  Pulse: 66 70  Resp: 13 20  Temp:  98.4 F (36.9 C)  SpO2: 95% 98%    General: Alert and oriented, sitting in chair Strength:5/5 throughout Sensation: intact and symmetric throughout  Skin: Surgical dressing dry and intact scant amount of sanguinous drainage noted, unchanged since immediately postoperative  Data:  Recent Labs  Lab 08/21/19 1017  NA 140  K 3.8  CL 105  CO2 28  BUN 32*  CREATININE 0.62  GLUCOSE 113*  CALCIUM 8.9   No results for input(s): AST, ALT, ALKPHOS in the last 168 hours.  Invalid input(s): TBILI   Recent Labs  Lab 08/21/19 1017  WBC 4.8  HGB 15.1  HCT 43.0  PLT 170   Recent Labs  Lab 08/21/19 1017  APTT 28  INR 1.0         Other tests/results: Pt was evaluated by cardiology as he was found to have a new left bundle branch block. He was evaluated by cardiology and found to be asymptomatic and stable for outpatient follow-up.  Has been arranged by the cardiology service.   Assessment/Plan:  Tommy Rowe is POD1 s/p right L4-5 microdiscectomy.  Intraoperatively it was found that he had intradural disc herniation, and therefore he was maintained with the head of bed flat x 24 hours due to concern for CSF leak.  His head of bed restrictions were liberalized around 930 this morning and when seen this afternoon, he has been able to urinate ambulate and tolerate p.o. without evidence of Headaches or drainage from his incision.  Therefore, he is cleared for discharge to home.  All  discharge instructions were given to him verbally as well as handwritten.  He will follow up in 2 weeks with our office.   Patsey Berthold, NP Department of Neurosurgery

## 2019-08-27 NOTE — Progress Notes (Signed)
*  PRELIMINARY RESULTS* Echocardiogram 2D Echocardiogram has been performed.  Tommy Rowe 08/27/2019, 8:54 AM

## 2019-08-27 NOTE — Progress Notes (Signed)
SUBJECTIVE: Patient doing well.  Continues to deny any shortness of breath or chest pain.     Vitals:   08/26/19 1538 08/26/19 2247 08/27/19 0120 08/27/19 0602  BP: (!) 144/90 139/79 (!) 149/79 138/81  Pulse: 99 67 65   Resp: 18 16 18 18   Temp: (!) 97.5 F (36.4 C) 99.5 F (37.5 C) 99.9 F (37.7 C) 98.8 F (37.1 C)  TempSrc: Temporal Temporal  Temporal  SpO2: 96% 96% 96% 95%  Weight:      Height:        Intake/Output Summary (Last 24 hours) at 08/27/2019 0958 Last data filed at 08/27/2019 0300 Gross per 24 hour  Intake 510 ml  Output 1525 ml  Net -1015 ml    LABS: Basic Metabolic Panel: No results for input(s): NA, K, CL, CO2, GLUCOSE, BUN, CREATININE, CALCIUM, MG, PHOS in the last 72 hours. Liver Function Tests: No results for input(s): AST, ALT, ALKPHOS, BILITOT, PROT, ALBUMIN in the last 72 hours. No results for input(s): LIPASE, AMYLASE in the last 72 hours. CBC: No results for input(s): WBC, NEUTROABS, HGB, HCT, MCV, PLT in the last 72 hours. Cardiac Enzymes: No results for input(s): CKTOTAL, CKMB, CKMBINDEX, TROPONINI in the last 72 hours. BNP: Invalid input(s): POCBNP D-Dimer: No results for input(s): DDIMER in the last 72 hours. Hemoglobin A1C: No results for input(s): HGBA1C in the last 72 hours. Fasting Lipid Panel: No results for input(s): CHOL, HDL, LDLCALC, TRIG, CHOLHDL, LDLDIRECT in the last 72 hours. Thyroid Function Tests: No results for input(s): TSH, T4TOTAL, T3FREE, THYROIDAB in the last 72 hours.  Invalid input(s): FREET3 Anemia Panel: No results for input(s): VITAMINB12, FOLATE, FERRITIN, TIBC, IRON, RETICCTPCT in the last 72 hours.   PHYSICAL EXAM General: Well developed, well nourished, in no acute distress HEENT:  Normocephalic and atramatic Neck:  No JVD.  Lungs: Clear bilaterally to auscultation and percussion. Heart: HRRR . Normal S1 and S2 without gallops. II/VI systolic murmur  Abdomen: Bowel sounds are positive, abdomen soft and  non-tender  Msk:  Back normal, normal gait. Normal strength and tone for age. Extremities: No clubbing, cyanosis or edema.   Neuro: Alert and oriented X 3. Psych:  Good affect, responds appropriately  TELEMETRY: Normal sinus with left bundle branch block  ASSESSMENT AND PLAN: Patient found to have a left bundle branch block while being admitted for a microdiscectomy.  Patient continues to be asymptomatic and normotensive.  Echocardiogram this morning revealed ejection fraction 60-65% with severe concentric left ventricular hypertrophy and grade 1 diastolic dysfunction. The patient's wife was at bedside and everything was discussed and explained to her as well as the patient. From a cardiac point of view, the patient is stable for discharge and will have a further cardiac work-up in the outpatient setting.  Depending on the patient's recovery, will plan to see him in the office next week.    Active Problems:   Lumbar radiculopathy    07-14-1996, NP-C 08/27/2019 9:58 AM

## 2019-08-27 NOTE — Discharge Instructions (Signed)
NEUROSURGERY DISCHARGE INSTRUCTIONS  The following are instructions to help in your recovery once you have been discharged from the hospital. Even if you feel well, it is important that you follow these activity guidelines.  What to do after you leave the hospital:  Recommended diet:  Increase protein intake to promote wound healing. You may return to your usual diet. However, you may experience discomfort when swallowing in the first month after your surgery. This is normal. You may find that softer foods are more comfortable for you to swallow. Be sure to stay hydrated.   Recommended activity: No bending, lifting, or twisting ("BLT"). Avoid lifting objects heavier than 10 pounds (gallon milk jug). Where possible, avoid household activities that involve lifting, bending, reaching, pushing, or pulling such as laundry, vacuuming, grocery shopping, and childcare. Try to arrange for help from friends and family for these activities while you heal.   Increase physical activity slowly as tolerated. Taking short walks is encouraged, but avoid strenuous exercise. Do not jog, run, bicycle, lift weights, or participate in any other exercises unless specifically allowed by your doctor.   You should not drive until cleared by your doctor.   Until released by your doctor, you should not return to work or school. You should rest at home and let your body heal.   You may shower the day after your surgery. After showering, lightly dab your incision dry. Do not take a tub bath or go swimming until approved by your doctor at your follow-up appointment.   If you smoke, we strongly recommend that you quit. Smoking has been proven to interfere with normal bone healing and will dramatically reduce the success rate of your surgery. Please contact [facility] (800-QUIT-NOW) and use the resources at www.[facility].com for assistance in stopping smoking.   Medications  Do not restart Aspirin until seven days after  surgery  * Do not take anti-inflammatory medications for 3 days after surgery (naproxen [Aleve], ibuprofen [Advil, Motrin], celecoxib [Celebrex], etc.).   You may restart home medications.   Wound Care Instructions  If you have a dressing on your incision, remove it two days after your surgery. Keep your incision area clean and dry.   If you have staples or stitches on your incision, you should have a follow up scheduled for removal. If you do not have staples or stitches, you will have steri-strips (small pieces of surgical tape) or Dermabond glue. The steri-strips/glue should begin to peel away within about a week (it is fine if the steri-strips fall off before then). If the strips are still in place one week after your surgery, you may gently remove them.    Please Report any of the following: Should you experience any of the following, contact us immediately:   New numbness or weakness   Pain that is progressively getting worse, and is not relieved by your pain medication, muscle relaxers, rest, and warm compresses   Bleeding, redness, swelling, pain, or drainage from surgical incision   Chills or flu-like symptoms   Fever greater than 101.0 F (38.3 C)   Inability to eat, drink fluids, or take medications   Problems with bowel or bladder functions   Difficulty breathing or shortness of breath   Warmth, tenderness, or swelling in your calf    Additional Follow up appointments During office hours (Monday-Friday 9 am to 5 pm), please call your physician at [phone] and ask for Berdine Addison.   After hours and weekends, please call [phone] and an answering  service will put you in touch with either Dr. Adriana Simas or Dr. Myer Haff.   For a life-threatening emergency, call 911        No bending, lifting, twisting. Do not lift anything >10 lbs Call office for any temperatures/ fevers >100.4?chills/discharge from wound at incision site.  You may shower Saturday. You  can take off dressing on Friday. Please avoid any blood thinners for now.  AMBULATORY SURGERY  DISCHARGE INSTRUCTIONS   1) The drugs that you were given will stay in your system until tomorrow so for the next 24 hours you should not:  A) Drive an automobile B) Make any legal decisions C) Drink any alcoholic beverage   2) You may resume regular meals tomorrow.  Today it is better to start with liquids and gradually work up to solid foods.  You may eat anything you prefer, but it is better to start with liquids, then soup and crackers, and gradually work up to solid foods.   3) Please notify your doctor immediately if you have any unusual bleeding, trouble breathing, redness and pain at the surgery site, drainage, fever, or pain not relieved by medication.    4) Additional Instructions:        Please contact your physician with any problems or Same Day Surgery at [phone], Monday through Friday 6 am to 4 pm, or [place] at [facility] number at [phone].

## 2019-08-28 ENCOUNTER — Encounter: Payer: Self-pay | Admitting: Nurse Practitioner

## 2020-01-29 ENCOUNTER — Encounter: Payer: Self-pay | Admitting: Neurosurgery

## 2021-04-06 ENCOUNTER — Ambulatory Visit: Payer: Self-pay | Admitting: Cardiology

## 2021-04-06 DIAGNOSIS — R0602 Shortness of breath: Secondary | ICD-10-CM | POA: Insufficient documentation

## 2021-04-06 MED ORDER — SODIUM CHLORIDE 0.9% FLUSH
3.0000 mL | Freq: Two times a day (BID) | INTRAVENOUS | Status: AC
  Filled 2021-04-06: qty 3

## 2021-04-11 ENCOUNTER — Other Ambulatory Visit: Payer: Self-pay

## 2021-04-11 ENCOUNTER — Ambulatory Visit
Admission: RE | Admit: 2021-04-11 | Discharge: 2021-04-11 | Disposition: A | Discharge status: Home / Self Care | Payer: Managed Care, Other (non HMO) | Attending: Cardiovascular Disease | Admitting: Cardiovascular Disease

## 2021-04-11 ENCOUNTER — Encounter
Admission: RE | Disposition: A | Discharge status: Home / Self Care | Payer: Self-pay | Source: Home / Self Care | Attending: Cardiovascular Disease

## 2021-04-11 ENCOUNTER — Encounter: Payer: Self-pay | Admitting: Cardiovascular Disease

## 2021-04-11 DIAGNOSIS — I2510 Atherosclerotic heart disease of native coronary artery without angina pectoris: Secondary | ICD-10-CM | POA: Diagnosis not present

## 2021-04-11 DIAGNOSIS — R0602 Shortness of breath: Secondary | ICD-10-CM | POA: Insufficient documentation

## 2021-04-11 HISTORY — PX: LEFT HEART CATH AND CORONARY ANGIOGRAPHY: CATH118249

## 2021-04-11 SURGERY — LEFT HEART CATH AND CORONARY ANGIOGRAPHY
Anesthesia: Moderate Sedation | Laterality: Left

## 2021-04-11 MED ORDER — SODIUM CHLORIDE 0.9 % WEIGHT BASED INFUSION
3.0000 mL/kg/h | INTRAVENOUS | Status: DC
  Administered 2021-04-11: 3 mL/kg/h via INTRAVENOUS

## 2021-04-11 MED ORDER — SODIUM CHLORIDE 0.9 % IV SOLN: 250.0000 mL | INTRAVENOUS | Status: DC | PRN

## 2021-04-11 MED ORDER — LIDOCAINE HCL (PF) 1 % IJ SOLN
INTRAMUSCULAR | Status: DC | PRN
  Administered 2021-04-11: 10 mL

## 2021-04-11 MED ORDER — ACETAMINOPHEN 325 MG PO TABS: 650.0000 mg | ORAL_TABLET | ORAL | Status: DC | PRN

## 2021-04-11 MED ORDER — HYDRALAZINE HCL 20 MG/ML IJ SOLN: 10.0000 mg | INTRAMUSCULAR | Status: DC | PRN

## 2021-04-11 MED ORDER — IOHEXOL 300 MG/ML  SOLN
INTRAMUSCULAR | Status: DC | PRN
  Administered 2021-04-11: 55 mL

## 2021-04-11 MED ORDER — SODIUM CHLORIDE 0.9% FLUSH: 3.0000 mL | INTRAVENOUS | Status: DC | PRN

## 2021-04-11 MED ORDER — ASPIRIN 81 MG PO CHEW
CHEWABLE_TABLET | ORAL | Status: AC
  Filled 2021-04-11: qty 1

## 2021-04-11 MED ORDER — SODIUM CHLORIDE 0.9 % WEIGHT BASED INFUSION
1.0000 mL/kg/h | INTRAVENOUS | Status: DC
  Administered 2021-04-11: 1 mL/kg/h via INTRAVENOUS

## 2021-04-11 MED ORDER — FENTANYL CITRATE (PF) 100 MCG/2ML IJ SOLN
INTRAMUSCULAR | Status: DC | PRN
  Administered 2021-04-11: 50 ug via INTRAVENOUS

## 2021-04-11 MED ORDER — ASPIRIN 81 MG PO CHEW
81.0000 mg | CHEWABLE_TABLET | ORAL | Status: AC
  Administered 2021-04-11: 81 mg via ORAL

## 2021-04-11 MED ORDER — MIDAZOLAM HCL 2 MG/2ML IJ SOLN
INTRAMUSCULAR | Status: DC | PRN
  Administered 2021-04-11: 1 mg via INTRAVENOUS

## 2021-04-11 MED ORDER — HEPARIN (PORCINE) IN NACL 1000-0.9 UT/500ML-% IV SOLN
INTRAVENOUS | Status: AC
  Filled 2021-04-11: qty 1000

## 2021-04-11 MED ORDER — ONDANSETRON HCL 4 MG/2ML IJ SOLN: 4.0000 mg | Freq: Four times a day (QID) | INTRAMUSCULAR | Status: DC | PRN

## 2021-04-11 MED ORDER — MIDAZOLAM HCL 2 MG/2ML IJ SOLN
INTRAMUSCULAR | Status: AC
  Filled 2021-04-11: qty 2

## 2021-04-11 MED ORDER — SODIUM CHLORIDE 0.9 % WEIGHT BASED INFUSION: 1.0000 mL/kg/h | INTRAVENOUS | Status: DC

## 2021-04-11 MED ORDER — LIDOCAINE HCL 1 % IJ SOLN
INTRAMUSCULAR | Status: AC
  Filled 2021-04-11: qty 20

## 2021-04-11 MED ORDER — SODIUM CHLORIDE 0.9% FLUSH: 3.0000 mL | Freq: Two times a day (BID) | INTRAVENOUS | Status: DC

## 2021-04-11 MED ORDER — FENTANYL CITRATE (PF) 100 MCG/2ML IJ SOLN
INTRAMUSCULAR | Status: AC
  Filled 2021-04-11: qty 2

## 2021-04-11 MED ORDER — LABETALOL HCL 5 MG/ML IV SOLN: 10.0000 mg | INTRAVENOUS | Status: DC | PRN

## 2021-04-11 MED ORDER — HEPARIN (PORCINE) IN NACL 2000-0.9 UNIT/L-% IV SOLN
INTRAVENOUS | Status: DC | PRN
  Administered 2021-04-11: 1000 mL

## 2021-04-11 SURGICAL SUPPLY — 10 items
CATH INFINITI 5FR MULTPACK ANG (CATHETERS) ×1 IMPLANT
DEVICE CLOSURE MYNXGRIP 5F (Vascular Products) ×1 IMPLANT
NDL PERC 18GX7CM (NEEDLE) IMPLANT
NEEDLE PERC 18GX7CM (NEEDLE) ×2 IMPLANT
PACK CARDIAC CATH (CUSTOM PROCEDURE TRAY) ×2 IMPLANT
PROTECTION STATION PRESSURIZED (MISCELLANEOUS) ×2
SET ATX SIMPLICITY (MISCELLANEOUS) ×1 IMPLANT
SHEATH AVANTI 5FR X 11CM (SHEATH) ×1 IMPLANT
STATION PROTECTION PRESSURIZED (MISCELLANEOUS) IMPLANT
WIRE GUIDERIGHT .035X150 (WIRE) ×1 IMPLANT

## 2021-04-14 IMAGING — RF DG C-ARM 1-60 MIN
1 series · 3 of 3 positions shown · non-contrast
Comparison: None.

CLINICAL DATA: L4-5 discectomy

EXAM:
LUMBAR SPINE - 2-3 VIEW; DG C-ARM 1-60 MIN

[Series 1: dg x-ray · 0.20mm/px · 3 of 3 slices shown]
[im 1/3]
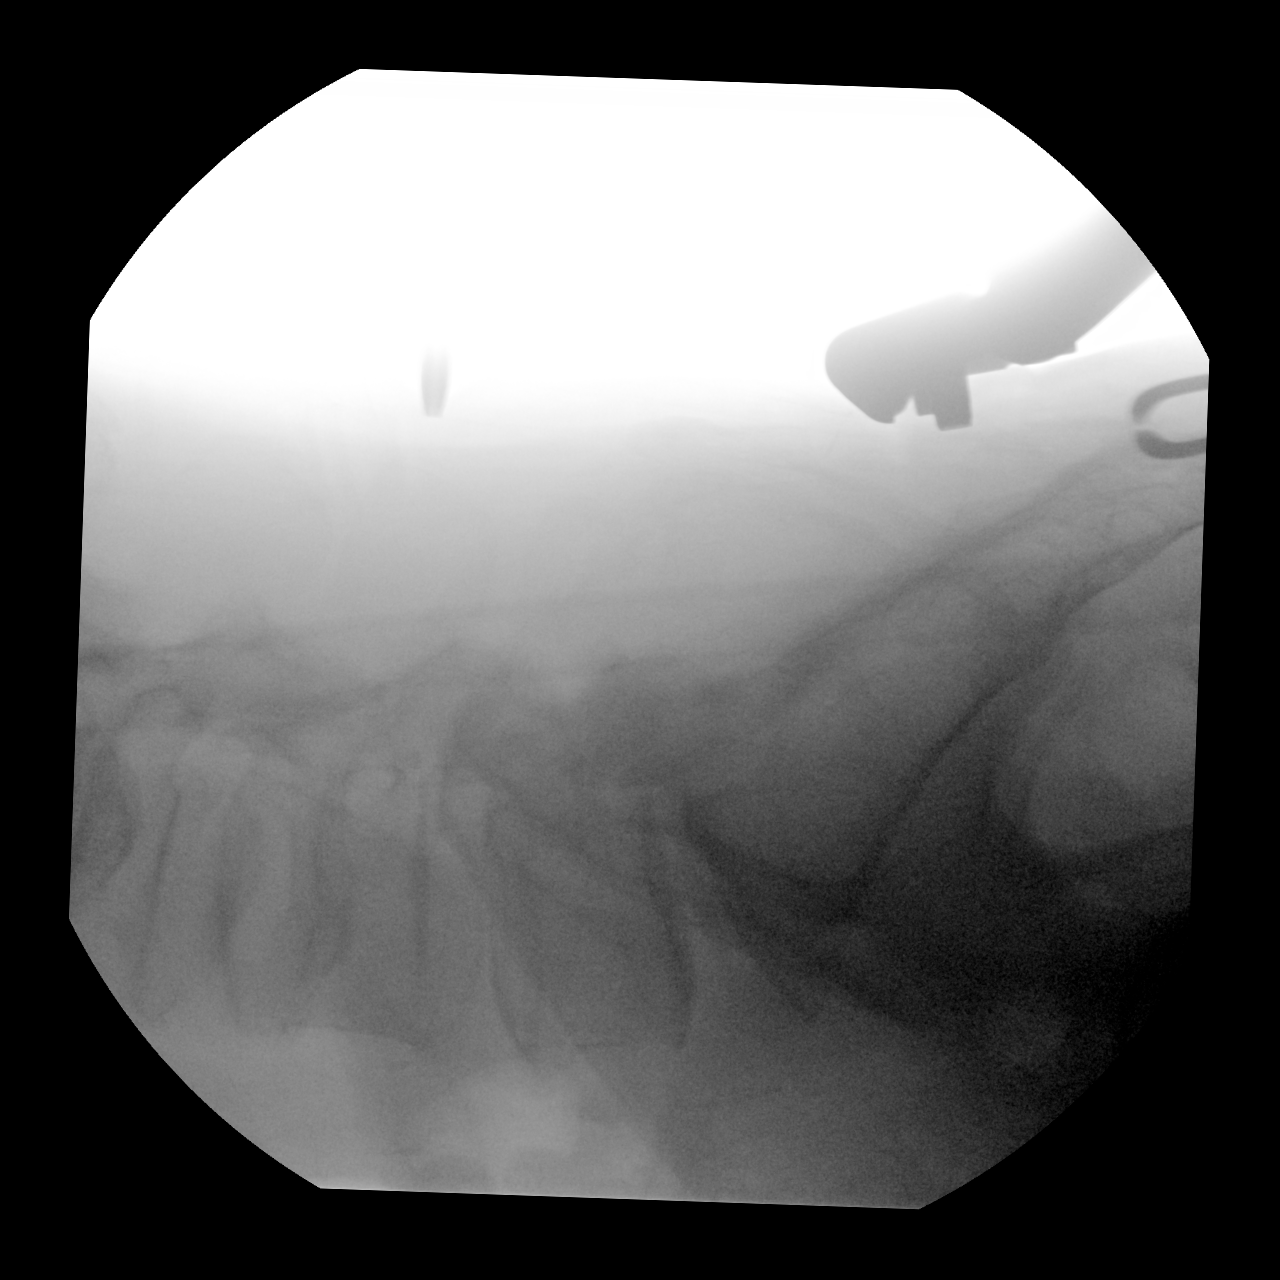
[im 2/3]
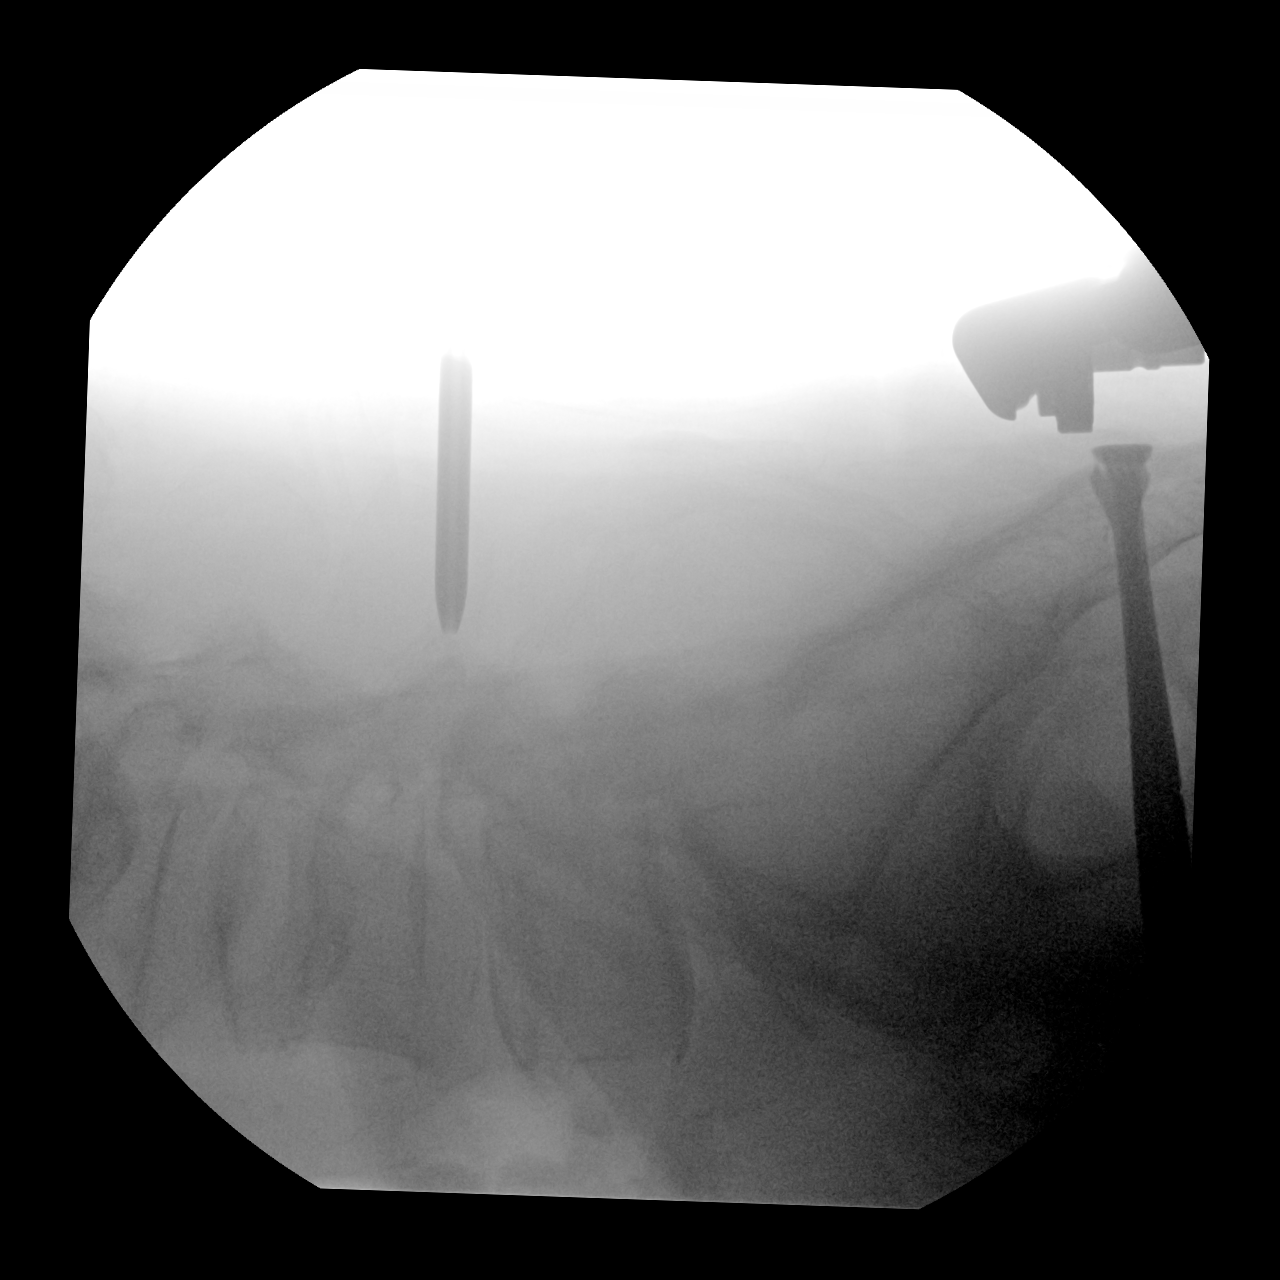
[im 3/3]
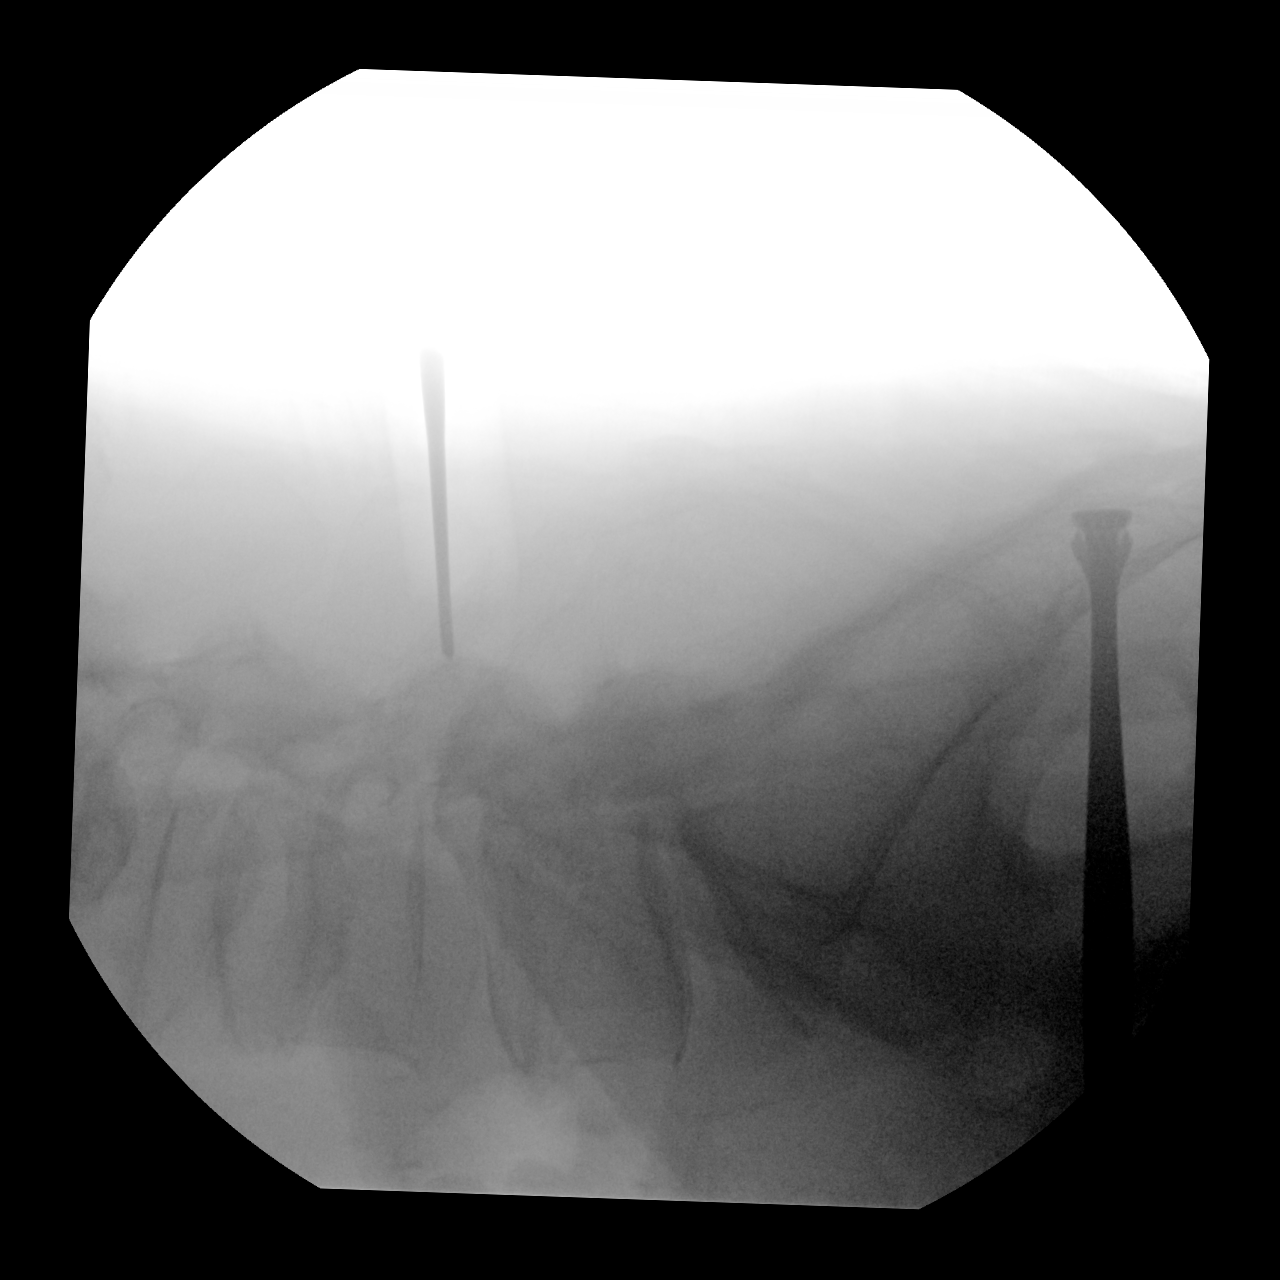

[3 of 3 positions shown; findings below may reference images not displayed]

FLUOROSCOPY TIME:  Radiation Exposure Index (as provided by the
fluoroscopic device): 2.24 mGy

If the device does not provide the exposure index:

Fluoroscopy Time:  3 seconds

Number of Acquired Images:  3
FINDINGS: Initial images demonstrate a surgical instrument at the skin surface
just above the L4-5 interspace. Subsequent advancement of
instruments towards the L4-5 disc space is seen.
IMPRESSION: Intraoperative localization for L4-5 discectomy.

## 2021-04-14 IMAGING — RF DG LUMBAR SPINE 2-3V
1 series · 3 of 3 positions shown · non-contrast
Comparison: None.

CLINICAL DATA: L4-5 discectomy

EXAM:
LUMBAR SPINE - 2-3 VIEW; DG C-ARM 1-60 MIN

[Series 1: dg x-ray · 0.20mm/px · 3 of 3 slices shown]
[im 1/3]
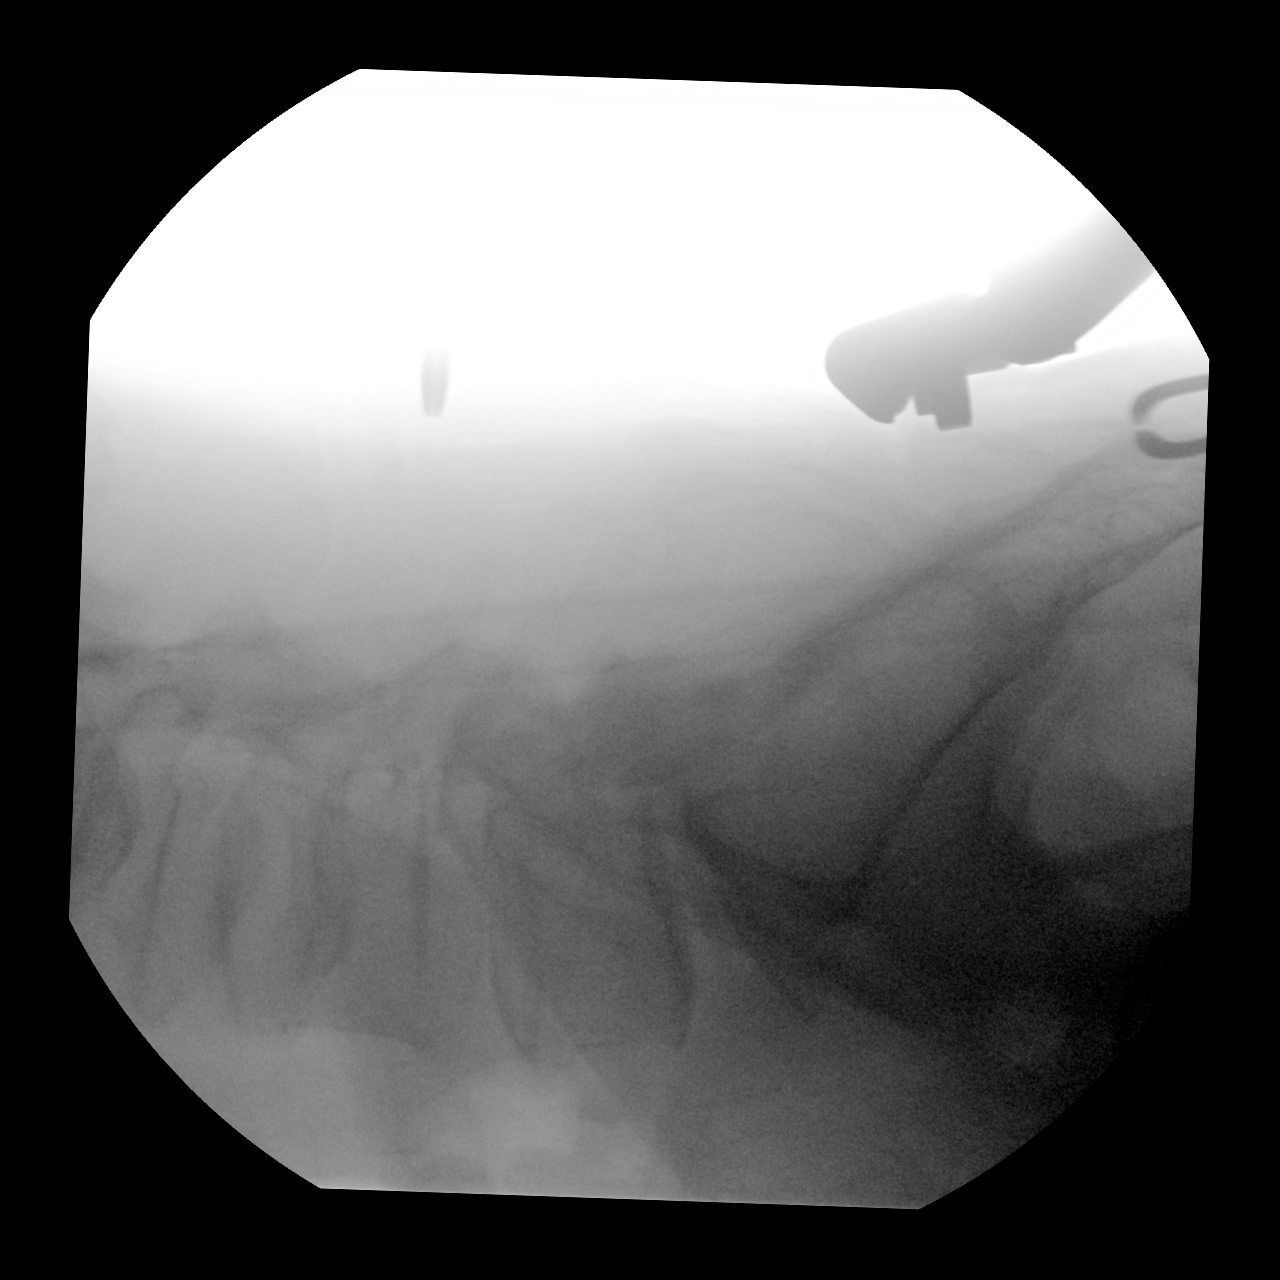
[im 2/3]
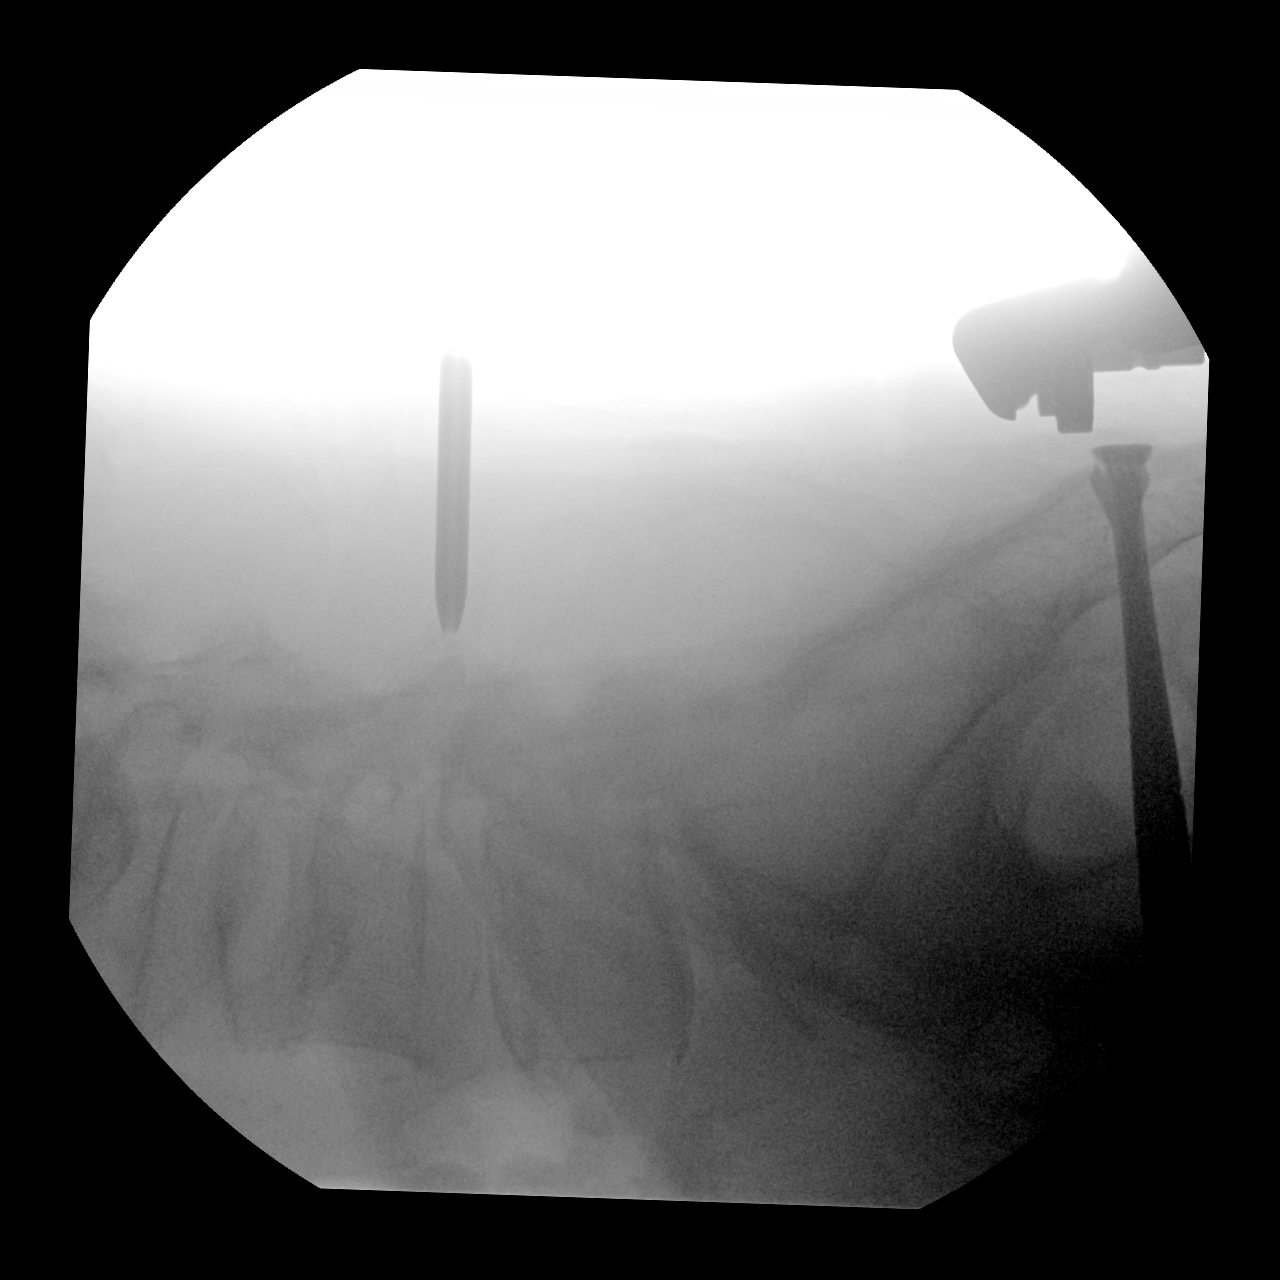
[im 3/3]
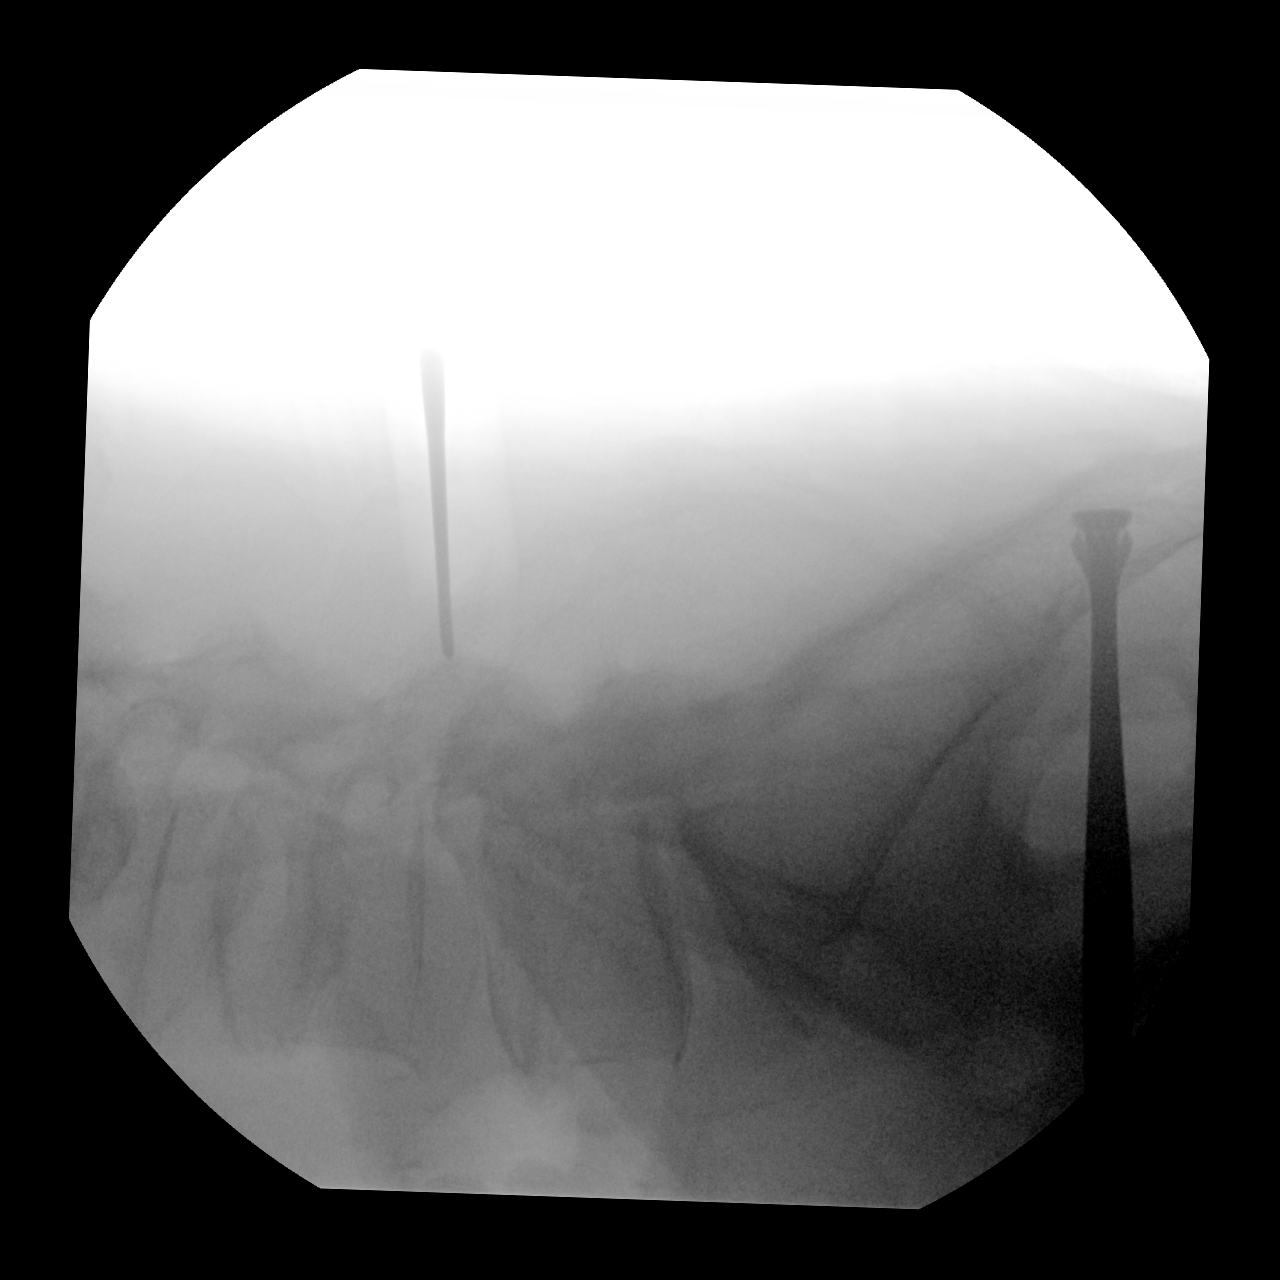

[3 of 3 positions shown; findings below may reference images not displayed]

FLUOROSCOPY TIME:  Radiation Exposure Index (as provided by the
fluoroscopic device): 2.24 mGy

If the device does not provide the exposure index:

Fluoroscopy Time:  3 seconds

Number of Acquired Images:  3
FINDINGS: Initial images demonstrate a surgical instrument at the skin surface
just above the L4-5 interspace. Subsequent advancement of
instruments towards the L4-5 disc space is seen.
IMPRESSION: Intraoperative localization for L4-5 discectomy.

## 2021-08-17 LAB — COLOGUARD: COLOGUARD: NEGATIVE

## 2021-09-17 ENCOUNTER — Encounter: Payer: Self-pay | Admitting: Medical Oncology

## 2021-09-17 ENCOUNTER — Inpatient Hospital Stay
Admission: EM | Admit: 2021-09-17 | Discharge: 2021-09-19 | DRG: 247 | Disposition: A | Discharge status: Home / Self Care | Payer: Managed Care, Other (non HMO) | Attending: Internal Medicine | Admitting: Internal Medicine

## 2021-09-17 ENCOUNTER — Emergency Department: Payer: Managed Care, Other (non HMO)

## 2021-09-17 ENCOUNTER — Other Ambulatory Visit: Payer: Self-pay

## 2021-09-17 DIAGNOSIS — Z87891 Personal history of nicotine dependence: Secondary | ICD-10-CM | POA: Diagnosis not present

## 2021-09-17 DIAGNOSIS — I25119 Atherosclerotic heart disease of native coronary artery with unspecified angina pectoris: Secondary | ICD-10-CM | POA: Diagnosis present

## 2021-09-17 DIAGNOSIS — Z8249 Family history of ischemic heart disease and other diseases of the circulatory system: Secondary | ICD-10-CM

## 2021-09-17 DIAGNOSIS — F419 Anxiety disorder, unspecified: Secondary | ICD-10-CM | POA: Diagnosis present

## 2021-09-17 DIAGNOSIS — I447 Left bundle-branch block, unspecified: Secondary | ICD-10-CM | POA: Diagnosis present

## 2021-09-17 DIAGNOSIS — F418 Other specified anxiety disorders: Secondary | ICD-10-CM | POA: Diagnosis present

## 2021-09-17 DIAGNOSIS — E785 Hyperlipidemia, unspecified: Secondary | ICD-10-CM | POA: Diagnosis present

## 2021-09-17 DIAGNOSIS — M5416 Radiculopathy, lumbar region: Secondary | ICD-10-CM | POA: Diagnosis present

## 2021-09-17 DIAGNOSIS — I10 Essential (primary) hypertension: Secondary | ICD-10-CM | POA: Diagnosis present

## 2021-09-17 DIAGNOSIS — I214 Non-ST elevation (NSTEMI) myocardial infarction: Principal | ICD-10-CM

## 2021-09-17 DIAGNOSIS — I482 Chronic atrial fibrillation, unspecified: Secondary | ICD-10-CM | POA: Diagnosis present

## 2021-09-17 DIAGNOSIS — D72829 Elevated white blood cell count, unspecified: Secondary | ICD-10-CM | POA: Diagnosis present

## 2021-09-17 DIAGNOSIS — I4819 Other persistent atrial fibrillation: Secondary | ICD-10-CM | POA: Diagnosis not present

## 2021-09-17 DIAGNOSIS — Z7901 Long term (current) use of anticoagulants: Secondary | ICD-10-CM

## 2021-09-17 DIAGNOSIS — Z20822 Contact with and (suspected) exposure to covid-19: Secondary | ICD-10-CM | POA: Diagnosis present

## 2021-09-17 DIAGNOSIS — I4892 Unspecified atrial flutter: Secondary | ICD-10-CM | POA: Diagnosis not present

## 2021-09-17 DIAGNOSIS — I2510 Atherosclerotic heart disease of native coronary artery without angina pectoris: Secondary | ICD-10-CM | POA: Diagnosis not present

## 2021-09-17 DIAGNOSIS — Z79899 Other long term (current) drug therapy: Secondary | ICD-10-CM | POA: Diagnosis not present

## 2021-09-17 DIAGNOSIS — E876 Hypokalemia: Secondary | ICD-10-CM | POA: Diagnosis not present

## 2021-09-17 DIAGNOSIS — I480 Paroxysmal atrial fibrillation: Secondary | ICD-10-CM | POA: Diagnosis not present

## 2021-09-17 HISTORY — DX: Essential (primary) hypertension: I10

## 2021-09-17 HISTORY — DX: Unspecified atrial fibrillation: I48.91

## 2021-09-17 HISTORY — DX: Hyperlipidemia, unspecified: E78.5

## 2021-09-17 LAB — LIPID PANEL
Cholesterol: 109 mg/dL (ref 0–200)
HDL: 49 mg/dL (ref >40)
LDL Cholesterol: 55 mg/dL (ref 0–99)
Total CHOL/HDL Ratio: 2.2 RATIO
Triglycerides: 23 mg/dL (ref <150)
VLDL: 5 mg/dL (ref 0–40)

## 2021-09-17 LAB — TROPONIN I (HIGH SENSITIVITY)
Troponin I (High Sensitivity): 13714 ng/L (ref <18)
Troponin I (High Sensitivity): 13561 ng/L (ref <18)
Troponin I (High Sensitivity): 17783 ng/L (ref <18)
Troponin I (High Sensitivity): 16413 ng/L (ref <18)

## 2021-09-17 LAB — HEPARIN LEVEL (UNFRACTIONATED): Heparin Unfractionated: >1.1 IU/mL — ABNORMAL HIGH (ref 0.30–0.70)

## 2021-09-17 LAB — CBC
HCT: 48.5 % (ref 39.0–52.0)
Hemoglobin: 16.3 g/dL (ref 13.0–17.0)
MCH: 31.2 pg (ref 26.0–34.0)
MCHC: 33.6 g/dL (ref 30.0–36.0)
MCV: 92.9 fL (ref 80.0–100.0)
Platelets: 167 10*3/uL (ref 150–400)
RBC: 5.22 MIL/uL (ref 4.22–5.81)
RDW: 12.6 % (ref 11.5–15.5)
WBC: 10.6 10*3/uL — ABNORMAL HIGH (ref 4.0–10.5)
nRBC: 0 % (ref 0.0–0.2)

## 2021-09-17 LAB — BASIC METABOLIC PANEL
Anion gap: 9 (ref 5–15)
BUN: 24 mg/dL — ABNORMAL HIGH (ref 8–23)
CO2: 27 mmol/L (ref 22–32)
Calcium: 9.1 mg/dL (ref 8.9–10.3)
Chloride: 101 mmol/L (ref 98–111)
Creatinine, Ser: 0.89 mg/dL (ref 0.61–1.24)
GFR, Estimated: >60 mL/min (ref >60)
Glucose, Bld: 163 mg/dL — ABNORMAL HIGH (ref 70–99)
Potassium: 3.9 mmol/L (ref 3.5–5.1)
Sodium: 137 mmol/L (ref 135–145)

## 2021-09-17 LAB — PROTIME-INR
INR: 1.3 — ABNORMAL HIGH (ref 0.8–1.2)
Prothrombin Time: 16.5 seconds — ABNORMAL HIGH (ref 11.4–15.2)

## 2021-09-17 LAB — HEMOGLOBIN A1C
Hgb A1c MFr Bld: 5.7 % — ABNORMAL HIGH (ref 4.8–5.6)
Mean Plasma Glucose: 116.89 mg/dL

## 2021-09-17 LAB — MRSA NEXT GEN BY PCR, NASAL: MRSA by PCR Next Gen: DETECTED — AB

## 2021-09-17 LAB — APTT
aPTT: 31 seconds (ref 24–36)
aPTT: 54 seconds — ABNORMAL HIGH (ref 24–36)

## 2021-09-17 MED ORDER — NITROGLYCERIN 0.4 MG SL SUBL
0.4000 mg | SUBLINGUAL_TABLET | SUBLINGUAL | Status: DC | PRN
  Administered 2021-09-17: 0.4 mg via SUBLINGUAL
  Filled 2021-09-17: qty 1

## 2021-09-17 MED ORDER — HYDRALAZINE HCL 20 MG/ML IJ SOLN: 5.0000 mg | INTRAMUSCULAR | Status: DC | PRN

## 2021-09-17 MED ORDER — SODIUM CHLORIDE 0.9% FLUSH
3.0000 mL | Freq: Two times a day (BID) | INTRAVENOUS | Status: DC
Start: 2021-09-17 — End: 2021-09-19
  Administered 2021-09-17 – 2021-09-18 (×3): 3 mL via INTRAVENOUS

## 2021-09-17 MED ORDER — AMIODARONE HCL 200 MG PO TABS
200.0000 mg | ORAL_TABLET | Freq: Every day | ORAL | Status: DC
Start: 2021-09-17 — End: 2021-09-18
  Administered 2021-09-17 – 2021-09-18 (×2): 200 mg via ORAL
  Filled 2021-09-17 (×2): qty 1

## 2021-09-17 MED ORDER — ONDANSETRON HCL 4 MG/2ML IJ SOLN: 4.0000 mg | Freq: Three times a day (TID) | INTRAMUSCULAR | Status: DC | PRN

## 2021-09-17 MED ORDER — VANCOMYCIN HCL 1500 MG/300ML IV SOLN
1500.0000 mg | Freq: Two times a day (BID) | INTRAVENOUS | Status: DC
  Administered 2021-09-18: 1500 mg via INTRAVENOUS
  Filled 2021-09-17 (×2): qty 300

## 2021-09-17 MED ORDER — LOSARTAN POTASSIUM-HCTZ 100-25 MG PO TABS
1.0000 | ORAL_TABLET | Freq: Every day | ORAL | Status: DC
Start: 2021-09-18 — End: 2021-09-17

## 2021-09-17 MED ORDER — ESCITALOPRAM OXALATE 20 MG PO TABS
20.0000 mg | ORAL_TABLET | Freq: Every day | ORAL | Status: DC
  Administered 2021-09-17 – 2021-09-18 (×2): 20 mg via ORAL
  Filled 2021-09-17: qty 1
  Filled 2021-09-17: qty 2

## 2021-09-17 MED ORDER — LOSARTAN POTASSIUM 50 MG PO TABS
100.0000 mg | ORAL_TABLET | Freq: Every day | ORAL | Status: DC
  Administered 2021-09-18: 100 mg via ORAL
  Filled 2021-09-17: qty 2

## 2021-09-17 MED ORDER — CHLORHEXIDINE GLUCONATE CLOTH 2 % EX PADS
6.0000 | MEDICATED_PAD | Freq: Every day | CUTANEOUS | Status: DC
  Administered 2021-09-18 – 2021-09-19 (×2): 6 via TOPICAL

## 2021-09-17 MED ORDER — ACETAMINOPHEN 325 MG PO TABS
650.0000 mg | ORAL_TABLET | Freq: Four times a day (QID) | ORAL | Status: DC | PRN
  Administered 2021-09-17: 650 mg via ORAL
  Filled 2021-09-17: qty 2

## 2021-09-17 MED ORDER — ASPIRIN 81 MG PO CHEW
324.0000 mg | CHEWABLE_TABLET | Freq: Once | ORAL | Status: AC
  Administered 2021-09-17: 324 mg via ORAL
  Filled 2021-09-17: qty 4

## 2021-09-17 MED ORDER — MORPHINE SULFATE (PF) 2 MG/ML IV SOLN: 2.0000 mg | INTRAVENOUS | Status: DC | PRN

## 2021-09-17 MED ORDER — MUPIROCIN 2 % EX OINT
1.0000 | TOPICAL_OINTMENT | Freq: Two times a day (BID) | CUTANEOUS | Status: DC
  Administered 2021-09-17 – 2021-09-19 (×3): 1 via NASAL
  Filled 2021-09-17: qty 22

## 2021-09-17 MED ORDER — VANCOMYCIN HCL 2000 MG/400ML IV SOLN
2000.0000 mg | Freq: Once | INTRAVENOUS | Status: AC
  Administered 2021-09-17: 2000 mg via INTRAVENOUS
  Filled 2021-09-17: qty 400

## 2021-09-17 MED ORDER — DOXYLAMINE SUCCINATE (SLEEP) 25 MG PO TABS
12.5000 mg | ORAL_TABLET | Freq: Every day | ORAL | Status: DC
Start: 2021-09-17 — End: 2021-09-19
  Filled 2021-09-17 (×2): qty 1

## 2021-09-17 MED ORDER — SODIUM CHLORIDE 0.9 % IV SOLN
2.0000 g | Freq: Three times a day (TID) | INTRAVENOUS | Status: DC
  Administered 2021-09-17 – 2021-09-18 (×3): 2 g via INTRAVENOUS
  Filled 2021-09-17 (×3): qty 12.5
  Filled 2021-09-17: qty 2

## 2021-09-17 MED ORDER — ADULT MULTIVITAMIN W/MINERALS CH
1.0000 | ORAL_TABLET | Freq: Every day | ORAL | Status: DC
Start: 2021-09-17 — End: 2021-09-19
  Administered 2021-09-17 – 2021-09-19 (×3): 1 via ORAL
  Filled 2021-09-17 (×3): qty 1

## 2021-09-17 MED ORDER — SODIUM CHLORIDE 0.9 % IV SOLN: INTRAVENOUS | Status: DC

## 2021-09-17 MED ORDER — ROSUVASTATIN CALCIUM 10 MG PO TABS
20.0000 mg | ORAL_TABLET | Freq: Every day | ORAL | Status: DC
  Administered 2021-09-17 – 2021-09-18 (×2): 20 mg via ORAL
  Filled 2021-09-17 (×2): qty 2

## 2021-09-17 MED ORDER — HEPARIN (PORCINE) 25000 UT/250ML-% IV SOLN
1450.0000 [IU]/h | INTRAVENOUS | Status: DC
  Administered 2021-09-17: 1200 [IU]/h via INTRAVENOUS
  Administered 2021-09-18: 1550 [IU]/h via INTRAVENOUS
  Filled 2021-09-17 (×2): qty 250

## 2021-09-17 MED ORDER — HEPARIN BOLUS VIA INFUSION
2800.0000 [IU] | Freq: Once | INTRAVENOUS | Status: AC
  Administered 2021-09-17: 2800 [IU] via INTRAVENOUS
  Filled 2021-09-17: qty 2800

## 2021-09-17 MED ORDER — ASPIRIN 325 MG PO TABS
162.5000 mg | ORAL_TABLET | Freq: Every day | ORAL | Status: DC
Start: 2021-09-18 — End: 2021-09-18
  Filled 2021-09-17: qty 0.5

## 2021-09-17 MED ORDER — HEPARIN BOLUS VIA INFUSION
4000.0000 [IU] | Freq: Once | INTRAVENOUS | Status: AC
Start: 2021-09-17 — End: 2021-09-17
  Administered 2021-09-17: 4000 [IU] via INTRAVENOUS
  Filled 2021-09-17: qty 4000

## 2021-09-17 MED ORDER — HYDROCHLOROTHIAZIDE 25 MG PO TABS
25.0000 mg | ORAL_TABLET | Freq: Every day | ORAL | Status: DC
  Administered 2021-09-18 – 2021-09-19 (×2): 25 mg via ORAL
  Filled 2021-09-17 (×2): qty 1

## 2021-09-17 NOTE — ED Notes (Addendum)
CRITICAL VALUE STICKER  CRITICAL VALUE: [phone]  RECEIVER (on-site recipient of call): Tommy Rowe  DATE & TIME NOTIFIED: 09/17/2021 1000  MESSENGER (representative from lab):  MD NOTIFIED: Dr Katrinka Blazing  TIME OF NOTIFICATION: 1000  RESPONSE:  MD notified- pt roomed from ED lobby.

## 2021-09-17 NOTE — Consult Note (Signed)
Pharmacy Antibiotic Note  Tommy Rowe is a 66 y.o. male admitted on 09/17/2021 with  Fever, unclear source .  Pharmacy has been consulted for Vancomycin + Cefepime dosing.  Plan: Give Vancomycin 2000mg  IV x1 followed by Vancomycin 1500mg  IV every 12 hours Give Cefepime 2gm IV every 8 hours Goal AUC 400-550  Est AUC: 466.9 Est Cmax:32.8 Est Cmin: 12.0 Calculated with SCr 0.89   Height: 6' (182.9 cm) Weight: 93 kg (205 lb 0.4 oz) IBW/kg (Calculated) : 77.6  Temp (24hrs), Avg:100.2 F (37.9 C), Min:98.7 F (37.1 C), Max:101.6 F (38.7 C)  Recent Labs  Lab 09/17/21 0907  WBC 10.6*  CREATININE 0.89    Estimated Creatinine Clearance: 92 mL/min (by C-G formula based on SCr of 0.89 mg/dL).    No Known Allergies  Antimicrobials this admission: 6/18 Vancomycin >>  6/18 Cefepime >>   Dose adjustments this admission:   Microbiology results: 6/18 BCx: sent 6/18 UCx: sent  6/18 MRSA PCR: sent  Thank you for allowing pharmacy to be a part of this patient's care.  7/18 09/17/2021 6:02 PM

## 2021-09-17 NOTE — ED Notes (Signed)
Informed RN bed assigned 

## 2021-09-17 NOTE — Progress Notes (Signed)
ANTICOAGULATION CONSULT NOTE - Initial Consult  Pharmacy Consult for IV heparin Indication: chest pain/ACS  No Known Allergies  Patient Measurements: Height: 6' (182.9 cm) Weight: 93 kg (205 lb 0.4 oz) IBW/kg (Calculated) : 77.6 Heparin Dosing Weight: 93 kg  Vital Signs: Temp: 101.6 F (38.7 C) (06/18 1744) Temp Source: Oral (06/18 1744) BP: 129/65 (06/18 1744) Pulse Rate: 65 (06/18 1744)  Labs: Recent Labs    09/17/21 0907 09/17/21 1012 09/17/21 1122 09/17/21 1539 09/17/21 1746  HGB 16.3  --   --   --   --   HCT 48.5  --   --   --   --   PLT 167  --   --   --   --   APTT  --  31  --   --  54*  LABPROT  --  16.5*  --   --   --   INR  --  1.3*  --   --   --   HEPARINUNFRC  --  >1.10*  --   --   --   CREATININE 0.89  --   --   --   --   TROPONINIHS 13,714*  --  13,561* 75,170*  --      Estimated Creatinine Clearance: 92 mL/min (by C-G formula based on SCr of 0.89 mg/dL).   Medical History: Past Medical History:  Diagnosis Date   A-fib (HCC)    Anxiety    HLD (hyperlipidemia)    HTN (hypertension)    Medications:  PRN: acetaminophen, hydrALAZINE, morphine injection, nitroGLYCERIN, ondansetron (ZOFRAN) IV  PTA: apixaban (Eliquis) per my chart review (reported last dose 09/17/21 per med rec)  Assessment: 66 year old male presenting with central chest pressure and concern for NSTEMI. Troponins significantly elevated.  Baseline CBC stable, aPTT and heparin level elevated.   Goal of Therapy:  aPTT 66-102 seconds Monitor platelets by anticoagulation protocol: Yes   Plan:  6/18 1746 aPTT = 54 sec, SUBtherapeutic  Give 2800 units bolus x 1 Increase heparin infusion at 1550 units/hr Will monitor via aPTT until correlation with heparin levels. 6-hour aPTT following rate change CBC daily    Sharen Hones, PharmD, BCPS Clinical Pharmacist   09/17/2021 6:13 PM

## 2021-09-17 NOTE — Assessment & Plan Note (Signed)
Heart rate 88 -hold Eliquis -Amiodarone

## 2021-09-17 NOTE — Assessment & Plan Note (Addendum)
Trop 13714 --> 13561.  Consulted cardiology, Dr. Juliann Pares, likely to do cardiac cath tomorrow. - admit to tele bed as inpatient - IV heparin - Trend Trop - prn Nitroglycerin, Morphine, and aspirin, crestor - Risk factor stratification: will check FLP and A1C  - check UDS - 2d echo

## 2021-09-17 NOTE — H&P (Addendum)
History and Physical    DONELLE BABA PYP:[id number] DOB: 1955/09/19 DOA: 09/17/2021  Referring MD/NP/PA:   PCP: [facility]   Patient coming from:  The patient is coming from home.  At baseline, pt is independent for most of ADL.        Chief Complaint: chest pain  HPI: Tommy Rowe is a 66 y.o. male with medical history significant of hypertension, hyperlipidemia, atrial fibrillation on Eliquis, depression with anxiety, diplopia, lumbar radiculopathy, who presents with chest pain.  Patient states that his chest pain started in the early morning of yesterday (6/17), at about 1 AM when he was walking a dog.  His chest pain is located in substernal area, intermittent, pressure-like, initially 7 out of 10 in severity, currently 2 out of 10 in severity, radiating to the right shoulder and the right upper arm and the left neck.  Associated with mild shortness of breath.  No cough, fever or chills.  Patient does not have nausea, vomiting, diarrhea or abdominal pain.  No symptoms of UTI.  He took his last dose of Eliquis this morning.  Data Reviewed and ED Course: pt was found to have troponin level 13714 --> 13561, INR 1.3, PTT 31, renal function okay, temperature normal, blood pressure 92/64, 109/60, heart rate 88, 15, oxygen sat 92 to 93% on room air.  Chest x-ray negative.  Patient is admitted to telemetry bed as inpatient.  Dr. Juliann Pares of cardiology is consulted.   EKG: I have personally reviewed.  Sinus rhythm, left bundle blockade, QTc 475, ST elevation in V1-V4, poor R wave progression.  ED physician consulted with Dr. Kary Kos of STEMI team, who read the EKG, do not think patient has STEMI since patient has similar EKG changes in the past.   Review of Systems:   General: no fevers, chills, no body weight gain, has fatigue HEENT: no blurry vision, hearing changes or sore throat Respiratory: has mild dyspnea, no coughing, wheezing CV: has chest pain, no  palpitations GI: no nausea, vomiting, abdominal pain, diarrhea, constipation GU: no dysuria, burning on urination, increased urinary frequency, hematuria  Ext: no leg edema Neuro: no unilateral weakness, numbness, or tingling, no vision change or hearing loss Skin: no rash, no skin tear. MSK: No muscle spasm, no deformity, no limitation of range of movement in spin Heme: No easy bruising.  Travel history: No recent long distant travel.   Allergy: No Known Allergies  Past Medical History:  Diagnosis Date   A-fib (HCC)    Anxiety    HLD (hyperlipidemia)    HTN (hypertension)     Past Surgical History:  Procedure Laterality Date   FACIAL FRACTURE SURGERY  1980   LEFT HEART CATH AND CORONARY ANGIOGRAPHY Left 04/11/2021   Procedure: LEFT HEART CATH AND CORONARY ANGIOGRAPHY;  Surgeon: Laurier Nancy, MD;  Location: [facility] INVASIVE CV LAB;  Service: Cardiovascular;  Laterality: Left;   LUMBAR LAMINECTOMY/DECOMPRESSION MICRODISCECTOMY N/A 08/26/2019   Procedure: RIGHT L4-5 MICRODISCECTOMY;  Surgeon: Venetia Night, MD;  Location: [facility] ORS;  Service: Neurosurgery;  Laterality: N/A;    Social History:  reports that he quit smoking about 33 years ago. His smoking use included cigarettes. He has never used smokeless tobacco. He reports that he does not drink alcohol and does not use drugs.  Family History:  Family History  Problem Relation Age of Onset   COPD Mother    Heart attack Father    Valvular heart disease Sister  Prior to Admission medications   Medication Sig Start Date End Date Taking? Authorizing Provider  amiodarone (PACERONE) 200 MG tablet Take 200 mg by mouth daily. 09/09/21  Yes [provider]  aspirin 325 MG tablet Take 162.5 mg by mouth at bedtime.   Yes [provider]  doxylamine, Sleep, (UNISOM) 25 MG tablet Take 12.5 mg by mouth at bedtime.   Yes [provider]  ELIQUIS 5 MG TABS tablet Take 5 mg by mouth 2 (two) times daily.  08/17/21  Yes [provider]  escitalopram (LEXAPRO) 20 MG tablet Take 20 mg by mouth at bedtime. 11/05/16  Yes [provider]  losartan-hydrochlorothiazide (HYZAAR) 100-25 MG tablet Take 1 tablet by mouth daily. 08/15/21  Yes [provider]  Multiple Vitamin (MULTIVITAMIN WITH MINERALS) TABS tablet Take 1 tablet by mouth daily. Centrum Silver for Men 50+   Yes [provider]  rosuvastatin (CRESTOR) 20 MG tablet Take 20 mg by mouth at bedtime.   Yes [provider]  baclofen (LIORESAL) 10 MG tablet Take 10 mg by mouth at bedtime as needed (neck spasms). Patient not taking: Reported on 09/17/2021    [provider]  losartan (COZAAR) 50 MG tablet Take 50 mg by mouth at bedtime. Patient not taking: Reported on 09/17/2021    [provider]    Physical Exam: Vitals:   09/17/21 1010 09/17/21 1030 09/17/21 1045 09/17/21 1100  BP:  119/70  109/60  Pulse:  61    Resp: 11 13 17 15   Temp:      TempSrc:      SpO2:  93%    Weight:      Height:       General: Not in acute distress HEENT:       Eyes: PERRL, EOMI, no scleral icterus.       ENT: No discharge from the ears and nose, no pharynx injection, no tonsillar enlargement.        Neck: No JVD, no bruit, no mass felt. Heme: No neck lymph node enlargement. Cardiac: S1/S2, RRR, No murmurs, No gallops or rubs. Respiratory: No rales, wheezing, rhonchi or rubs. GI: Soft, nondistended, nontender, no rebound pain, no organomegaly, BS present. GU: No hematuria Ext: No pitting leg edema bilaterally. 1+DP/PT pulse bilaterally. Musculoskeletal: No joint deformities, No joint redness or warmth, no limitation of ROM in spin. Skin: No rashes.  Neuro: Alert, oriented X3, cranial nerves II-XII grossly intact, moves all extremities normally. Psych: Patient is not psychotic, no suicidal or hemocidal ideation.  Labs on Admission: I have personally reviewed following labs and imaging  studies  CBC: Recent Labs  Lab 09/17/21 0907  WBC 10.6*  HGB 16.3  HCT 48.5  MCV 92.9  PLT 167   Basic Metabolic Panel: Recent Labs  Lab 09/17/21 0907  NA 137  K 3.9  CL 101  CO2 27  GLUCOSE 163*  BUN 24*  CREATININE 0.89  CALCIUM 9.1   GFR: Estimated Creatinine Clearance: 92 mL/min (by C-G formula based on SCr of 0.89 mg/dL). Liver Function Tests: No results for input(s): "AST", "ALT", "ALKPHOS", "BILITOT", "PROT", "ALBUMIN" in the last 168 hours. No results for input(s): "LIPASE", "AMYLASE" in the last 168 hours. No results for input(s): "AMMONIA" in the last 168 hours. Coagulation Profile: Recent Labs  Lab 09/17/21 1012  INR 1.3*   Cardiac Enzymes: No results for input(s): "CKTOTAL", "CKMB", "CKMBINDEX", "TROPONINI" in the last 168 hours. BNP (last 3 results) No results for input(s): "PROBNP" in  the last 8760 hours. HbA1C: No results for input(s): "HGBA1C" in the last 72 hours. CBG: No results for input(s): "GLUCAP" in the last 168 hours. Lipid Profile: No results for input(s): "CHOL", "HDL", "LDLCALC", "TRIG", "CHOLHDL", "LDLDIRECT" in the last 72 hours. Thyroid Function Tests: No results for input(s): "TSH", "T4TOTAL", "FREET4", "T3FREE", "THYROIDAB" in the last 72 hours. Anemia Panel: No results for input(s): "VITAMINB12", "FOLATE", "FERRITIN", "TIBC", "IRON", "RETICCTPCT" in the last 72 hours. Urine analysis:    Component Value Date/Time   COLORURINE YELLOW (A) 08/21/2019 1017   APPEARANCEUR HAZY (A) 08/21/2019 1017   LABSPEC 1.031 (H) 08/21/2019 1017   PHURINE 5.0 08/21/2019 1017   GLUCOSEU NEGATIVE 08/21/2019 1017   HGBUR NEGATIVE 08/21/2019 1017   BILIRUBINUR NEGATIVE 08/21/2019 1017   KETONESUR NEGATIVE 08/21/2019 1017   PROTEINUR NEGATIVE 08/21/2019 1017   NITRITE NEGATIVE 08/21/2019 1017   LEUKOCYTESUR NEGATIVE 08/21/2019 1017   Sepsis Labs: @LABRCNTIP (procalcitonin:4,lacticidven:4) )No results found for this or any previous visit (from  the past 240 hour(s)).   Radiological Exams on Admission: DG Chest 2 View  Result Date: 09/17/2021 CLINICAL DATA:  66 year old male with history of chest pain. EXAM: CHEST - 2 VIEW COMPARISON:  No priors. FINDINGS: Lung volumes are normal. No consolidative airspace disease. No pleural effusions. No pneumothorax. No pulmonary nodule or mass noted. Pulmonary vasculature and the cardiomediastinal silhouette are within normal limits. IMPRESSION: No radiographic evidence of acute cardiopulmonary disease. Electronically Signed   By: 78 M.D.   On: 09/17/2021 09:49      Assessment/Plan Principal Problem:   NSTEMI (non-ST elevated myocardial infarction) (HCC) Active Problems:   Atrial fibrillation, chronic (HCC)   HTN (hypertension)   HLD (hyperlipidemia)   Depression with anxiety   Leukocytosis   Principal Problem:   NSTEMI (non-ST elevated myocardial infarction) (HCC) Active Problems:   Atrial fibrillation, chronic (HCC)   HTN (hypertension)   HLD (hyperlipidemia)   Depression with anxiety   Leukocytosis   Assessment and Plan: * NSTEMI (non-ST elevated myocardial infarction) (HCC) Trop 13714 --> 13561.  Consulted cardiology, Dr. 09/19/2021, likely to do cardiac cath tomorrow. - admit to tele bed as inpatient - IV heparin - Trend Trop - prn Nitroglycerin, Morphine, and aspirin, crestor - Risk factor stratification: will check FLP and A1C  - check UDS - 2d echo    Atrial fibrillation, chronic (HCC) Heart rate 88 -hold Eliquis -Amiodarone   HTN (hypertension) - IV hydralazine as needed -Continue Hyzzar  HLD (hyperlipidemia) - Crestor  Depression with anxiety - Continue Lexapro  Leukocytosis Mild leukocytosis with WBC 10.6, no signs of infection, likely reactive -Follow-up with CBC    Addendum: Patient developed  fever of 101.6 in the late afternoon.  Etiology is not clear.  Chest x-ray negative.  Patient denies symptoms of UTI.  Currently patient  does not meet criteria for sepsis. - check COVID PCR -Start vancomycin and cefepime empirically -Blood culture, urine culture and urinalysis         DVT ppx: on IV Heparin     Code Status: Full code  Family Communication:  Yes, patient's wife   at bed side.     Disposition Plan:  Anticipate discharge back to previous environment  Consults called:  Dr. Juliann Pares of card  Admission status and Level of care: Telemetry Cardiac:   as inpt       Severity of Illness:  The appropriate patient status for this patient is INPATIENT. Inpatient status is judged to be reasonable and necessary in  order to provide the required intensity of service to ensure the patient's safety. The patient's presenting symptoms, physical exam findings, and initial radiographic and laboratory data in the context of their chronic comorbidities is felt to place them at high risk for further clinical deterioration. Furthermore, it is not anticipated that the patient will be medically stable for discharge from the hospital within 2 midnights of admission.   * I certify that at the point of admission it is my clinical judgment that the patient will require inpatient hospital care spanning beyond 2 midnights from the point of admission due to high intensity of service, high risk for further deterioration and high frequency of surveillance required.*       Date of Service 09/17/2021    Lorretta Harp [facility]   If 7PM-7AM, please contact night-coverage [email or web address] 09/17/2021, 1:42 PM

## 2021-09-17 NOTE — ED Triage Notes (Signed)
Pt reports that he began yesterday morning while walking his dog having central chest pressure with pain into rt arm and shoulder. Pt reports minimal SOB. Pain down neck on left side.

## 2021-09-17 NOTE — Assessment & Plan Note (Signed)
-   Crestor 

## 2021-09-17 NOTE — ED Provider Notes (Signed)
[facility] Provider Note    Event Date/Time   First MD Initiated Contact with Patient 09/17/21 1008     (approximate)   History   Chest Pain   HPI  EZEKEIL BETHEL is a 66 y.o. male with history of CAD and atrial fibrillation who presents with chest pain since yesterday early morning around 1 or 2 AM started when the patient was walking his dog.  It has persisted since then although increasing and decreasing in intensity.  It is not exertional.  Currently it is about a 4/10 in intensity.  He reports associated lightheadedness but no shortness of breath or vomiting.  He has not had this count of pain previously.    Physical Exam   Triage Vital Signs: ED Triage Vitals  Enc Vitals Group     BP 09/17/21 0904 92/64     Pulse Rate 09/17/21 0904 88     Resp 09/17/21 0904 18     Temp 09/17/21 0904 98.7 F (37.1 C)     Temp Source 09/17/21 0904 Oral     SpO2 09/17/21 0904 92 %     Weight 09/17/21 0901 205 lb 0.4 oz (93 kg)     Height 09/17/21 0901 6' (1.829 m)     Head Circumference --      Peak Flow --      Pain Score 09/17/21 0901 3     Pain Loc --      Pain Edu? --      Excl. in GC? --     Most recent vital signs: Vitals:   09/17/21 1045 09/17/21 1100  BP:  109/60  Pulse:    Resp: 17 15  Temp:    SpO2:       General: Awake, no distress.  CV:  Good peripheral perfusion.  Normal heart sounds. Resp:  Normal effort.  Lungs CTAB. Abd:  No distention.  Other:  No peripheral edema.   ED Results / Procedures / Treatments   Labs (all labs ordered are listed, but only abnormal results are displayed) Labs Reviewed  BASIC METABOLIC PANEL - Abnormal; Notable for the following components:      Result Value   Glucose, Bld 163 (*)    BUN 24 (*)    All other components within normal limits  CBC - Abnormal; Notable for the following components:   WBC 10.6 (*)    All other components within normal limits  PROTIME-INR - Abnormal; Notable for the  following components:   Prothrombin Time 16.5 (*)    INR 1.3 (*)    All other components within normal limits  HEPARIN LEVEL (UNFRACTIONATED) - Abnormal; Notable for the following components:   Heparin Unfractionated >1.10 (*)    All other components within normal limits  TROPONIN I (HIGH SENSITIVITY) - Abnormal; Notable for the following components:   Troponin I (High Sensitivity) 13,714 (*)    All other components within normal limits  TROPONIN I (HIGH SENSITIVITY) - Abnormal; Notable for the following components:   Troponin I (High Sensitivity) 13,561 (*)    All other components within normal limits  APTT  APTT  URINE DRUG SCREEN, QUALITATIVE ([facility] ONLY)     EKG  ED ECG REPORT I, Dionne Bucy, the attending physician, personally viewed and interpreted this ECG.  Date: 09/17/2021 EKG Time: 0902 Rate: 73 Rhythm: normal sinus rhythm QRS Axis: normal Intervals: LBBB ST/T Wave abnormalities: Nonspecific ST abnormalities Narrative Interpretation: no evidence of acute ischemia; no  significant change when compared to EKG of 08/26/2019    RADIOLOGY  Chest x-ray: I independently viewed and interpreted the images; there is no focal consolidation or edema   PROCEDURES:  Critical Care performed: Yes, see critical care procedure note(s)  .Critical Care  Performed by: Dionne Bucy, MD Authorized by: Dionne Bucy, MD   Critical care provider statement:    Critical care time (minutes):  30   Critical care was time spent personally by me on the following activities:  Development of treatment plan with patient or surrogate, discussions with consultants, evaluation of patient's response to treatment, examination of patient, ordering and review of laboratory studies, ordering and review of radiographic studies, ordering and performing treatments and interventions, pulse oximetry, re-evaluation of patient's condition and review of old charts    MEDICATIONS  ORDERED IN ED: Medications  nitroGLYCERIN (NITROSTAT) SL tablet 0.4 mg (0.4 mg Sublingual Given 09/17/21 1040)  heparin bolus via infusion 4,000 Units (has no administration in time range)  heparin ADULT infusion 100 units/mL (25000 units/232mL) (has no administration in time range)  morphine (PF) 2 MG/ML injection 2 mg (has no administration in time range)  0.9 %  sodium chloride infusion (has no administration in time range)  ondansetron (ZOFRAN) injection 4 mg (has no administration in time range)  acetaminophen (TYLENOL) tablet 650 mg (has no administration in time range)  hydrALAZINE (APRESOLINE) injection 5 mg (has no administration in time range)  aspirin chewable tablet 324 mg (324 mg Oral Given 09/17/21 0913)     IMPRESSION / MDM / ASSESSMENT AND PLAN / ED COURSE  I reviewed the triage vital signs and the nursing notes.  66 year old male with PMH as noted above presents with chest pain since early yesterday morning which has persisted and is associated with lightheadedness.  I reviewed the past medical records.  The patient has no recent prior ED visits or admissions.  He had a cardiac catheterization in January of this year showing a 40% mid LAD lesion with no other acute findings.  On exam currently he is well-appearing and his vital signs are normal.  The physical exam is unremarkable.  EKG shows unchanged LBBB.  There are questionable small ST elevations in the lateral leads but they do not meet Sgarbossa criteria and there is no evidence of STEMI.  Initial lab work-up shows a significant elevated troponin.  Differential diagnosis includes, but is not limited to, non-STEMI.  I do not suspect demand ischemia or MI secondary to some other etiology.  The patient has no evidence of PE or other acute vascular etiology.  Patient's presentation is most consistent with acute presentation with potential threat to life or bodily function.  Other initial lab work-up is unremarkable.   The patient has no significant leukocytosis and the electrolytes are normal.  Chest x-ray shows no acute findings.  The patient took an Eliquis today.  I will put him on a heparin infusion.  He was already given aspirin in triage.  I will consult cardiology.  The patient is on the cardiac monitor to evaluate for evidence of arrhythmia and/or significant heart rate changes.  ----------------------------------------- 11:55 AM on 09/17/2021 -----------------------------------------  I attempted to contact Dr. Milta Deiters APP on-call but was instructed to call the on-call unassigned cardiologist.  I then attempted to contact Dr. Juliann Pares who is currently in a procedure.  I then consulted and discussed the case with Dr. Kirke Corin who is on-call for STEMI coverage.  I reviewed the EKGs with him.  He  confirms that the patient does not meet STEMI criteria.  He agrees with the current management.  The patient's pain is better after nitroglycerin.  His vital signs are stable.  I consulted Dr. Clyde Lundborg from the hospitalist service; based on our discussion he agrees to admit the patient.   FINAL CLINICAL IMPRESSION(S) / ED DIAGNOSES   Final diagnoses:  NSTEMI (non-ST elevated myocardial infarction) (HCC)     Rx / DC Orders   ED Discharge Orders     None        Note:  This document was prepared using Dragon voice recognition software and may include unintentional dictation errors.    Dionne Bucy, MD 09/17/21 1157

## 2021-09-17 NOTE — Assessment & Plan Note (Signed)
Continue Lexapro

## 2021-09-17 NOTE — Progress Notes (Signed)
ANTICOAGULATION CONSULT NOTE - Initial Consult  Pharmacy Consult for IV heparin Indication: chest pain/ACS  No Known Allergies  Patient Measurements: Height: 6' (182.9 cm) Weight: 93 kg (205 lb 0.4 oz) IBW/kg (Calculated) : 77.6 Heparin Dosing Weight: 93 kg  Vital Signs: Temp: 98.7 F (37.1 C) (06/18 0904) Temp Source: Oral (06/18 0904) BP: 110/66 (06/18 1005) Pulse Rate: 88 (06/18 0904)  Labs: Recent Labs    09/17/21 0907  HGB 16.3  HCT 48.5  PLT 167  CREATININE 0.89  TROPONINIHS 13,714*    Estimated Creatinine Clearance: 92 mL/min (by C-G formula based on SCr of 0.89 mg/dL).   Medical History: Past Medical History:  Diagnosis Date   A-fib (HCC)    Anxiety    Medications:  PRN: nitroGLYCERIN  PTA: apixaban (Eliquis) per my chart review (reported last dose 09/17/21 per med rec)  Assessment: 66 year old male presenting with central chest pressure and concern for NSTEMI. Troponins significantly elevated.  Baseline CBC stable, aPTT and heparin level elevated.   Goal of Therapy:  aPTT 66-102 seconds Monitor platelets by anticoagulation protocol: Yes   Plan:  Give 4,000 units bolus x 1 Start heparin infusion at 1200 units/hr Will monitor via aPTT until correlation with heparin levels. 6-hour aPTT   Jaynie Bream, PharmD Pharmacy Resident  09/17/2021 10:32 AM

## 2021-09-17 NOTE — Consult Note (Signed)
CARDIOLOGY CONSULT NOTE               Patient ID: Tommy Rowe MRN: [id number] DOB/AGE: Aug 23, 1955 66 y.o.  Admit date: 09/17/2021 Referring Physician Dr Lorretta Harp hospitalist Primary Physician [facility] urgent care Primary Cardiologist [facility] cardiologist Reason for Consultation non-STEMI  HPI: 66 year old white male history of hypertension hyperlipidemia atrial fibrillation anxiety left bundle branch block reportedly had a cardiac cath done about 6 months ago which showed mild disease in the left anterior descending with preserved left ventricular function.  Patient presented with chest pain angina substernal intermittent pressure-like 7 out of 10 in severity decided to come to the emergency room.  Elevated troponins of over 13,000 mild hypertension.  EKG normal sinus rhythm left bundle branch block nonspecific ST-T wave changes.  History of atrial fibrillation paroxysmal on amiodarone and Eliquis presents for further evaluation  Review of systems complete and found to be negative unless listed above     Past Medical History:  Diagnosis Date   A-fib (HCC)    Anxiety    HLD (hyperlipidemia)    HTN (hypertension)     Past Surgical History:  Procedure Laterality Date   FACIAL FRACTURE SURGERY  1980   LEFT HEART CATH AND CORONARY ANGIOGRAPHY Left 04/11/2021   Procedure: LEFT HEART CATH AND CORONARY ANGIOGRAPHY;  Surgeon: Laurier Nancy, MD;  Location: [facility] INVASIVE CV LAB;  Service: Cardiovascular;  Laterality: Left;   LUMBAR LAMINECTOMY/DECOMPRESSION MICRODISCECTOMY N/A 08/26/2019   Procedure: RIGHT L4-5 MICRODISCECTOMY;  Surgeon: Venetia Night, MD;  Location: [facility] ORS;  Service: Neurosurgery;  Laterality: N/A;    Medications Prior to Admission  Medication Sig Dispense Refill Last Dose   amiodarone (PACERONE) 200 MG tablet Take 200 mg by mouth daily.   09/16/2021   aspirin 325 MG tablet Take 162.5 mg by mouth at bedtime.   09/16/2021   doxylamine, Sleep,  (UNISOM) 25 MG tablet Take 12.5 mg by mouth at bedtime.   Past Week   ELIQUIS 5 MG TABS tablet Take 5 mg by mouth 2 (two) times daily.   09/17/2021   escitalopram (LEXAPRO) 20 MG tablet Take 20 mg by mouth at bedtime.  1 09/16/2021   losartan-hydrochlorothiazide (HYZAAR) 100-25 MG tablet Take 1 tablet by mouth daily.   09/17/2021   Multiple Vitamin (MULTIVITAMIN WITH MINERALS) TABS tablet Take 1 tablet by mouth daily. Centrum Silver for Men 50+   09/16/2021   rosuvastatin (CRESTOR) 20 MG tablet Take 20 mg by mouth at bedtime.   09/16/2021   baclofen (LIORESAL) 10 MG tablet Take 10 mg by mouth at bedtime as needed (neck spasms). (Patient not taking: Reported on 09/17/2021)   Not Taking   losartan (COZAAR) 50 MG tablet Take 50 mg by mouth at bedtime. (Patient not taking: Reported on 09/17/2021)   Not Taking   Social History   Socioeconomic History   Marital status: Married    Spouse name: Not on file   Number of children: Not on file   Years of education: Not on file   Highest education level: Not on file  Occupational History   Not on file  Tobacco Use   Smoking status: Former    Types: Cigarettes    Quit date: 04/02/1988    Years since quitting: 33.4   Smokeless tobacco: Never  Vaping Use   Vaping Use: Not on file  Substance and Sexual Activity   Alcohol use: No   Drug use: Never   Sexual activity: Not  on file  Other Topics Concern   Not on file  Social History Narrative   Not on file   Social Determinants of Health   Financial Resource Strain: Not on file  Food Insecurity: Not on file  Transportation Needs: Not on file  Physical Activity: Not on file  Stress: Not on file  Social Connections: Not on file  Intimate Partner Violence: Not on file    Family History  Problem Relation Age of Onset   COPD Mother    Heart attack Father    Valvular heart disease Sister       Review of systems complete and found to be negative unless listed above      PHYSICAL EXAM  General:  Well developed, well nourished, in no acute distress HEENT:  Normocephalic and atramatic Neck:  No JVD.  Lungs: Clear bilaterally to auscultation and percussion. Heart: HRRR . Normal S1 and S2 without gallops or murmurs.  Abdomen: Bowel sounds are positive, abdomen soft and non-tender  Msk:  Back normal, normal gait. Normal strength and tone for age. Extremities: No clubbing, cyanosis or edema.   Neuro: Alert and oriented X 3. Psych:  Good affect, responds appropriately  Labs:   Lab Results  Component Value Date   WBC 10.6 (H) 09/17/2021   HGB 16.3 09/17/2021   HCT 48.5 09/17/2021   MCV 92.9 09/17/2021   PLT 167 09/17/2021    Recent Labs  Lab 09/17/21 0907  NA 137  K 3.9  CL 101  CO2 27  BUN 24*  CREATININE 0.89  CALCIUM 9.1  GLUCOSE 163*   No results found for: "CKTOTAL", "CKMB", "CKMBINDEX", "TROPONINI"  Lab Results  Component Value Date   CHOL 109 09/17/2021   Lab Results  Component Value Date   HDL 49 09/17/2021   Lab Results  Component Value Date   LDLCALC 55 09/17/2021   Lab Results  Component Value Date   TRIG 23 09/17/2021   Lab Results  Component Value Date   CHOLHDL 2.2 09/17/2021   No results found for: "LDLDIRECT"    Radiology: DG Chest 2 View  Result Date: 09/17/2021 CLINICAL DATA:  66 year old male with history of chest pain. EXAM: CHEST - 2 VIEW COMPARISON:  No priors. FINDINGS: Lung volumes are normal. No consolidative airspace disease. No pleural effusions. No pneumothorax. No pulmonary nodule or mass noted. Pulmonary vasculature and the cardiomediastinal silhouette are within normal limits. IMPRESSION: No radiographic evidence of acute cardiopulmonary disease. Electronically Signed   By: Trudie Reed M.D.   On: 09/17/2021 09:49    EKG: Normal sinus rhythm left bundle branch block rate of 75  ASSESSMENT AND PLAN:  Non-STEMI Coronary artery disease Elevated troponins Hypertension Hyperlipidemia Atrial  fibrillation Anxiety Former smoker . Plan Agree with IV heparin therapy for non-STEMI elevated troponin Recommend echocardiogram for reassessment of ventricular function wall motion Statin therapy for hyperlipidemia History of atrial fibrillation continue amiodarone but hold Eliquis prior to cardiac cath Hyperlipidemia therapy continue cholesterol  Signed: Hannan Hutmacher D Zamariah Seaborn M 09/17/2021, 9:48 PM

## 2021-09-17 NOTE — Assessment & Plan Note (Signed)
Mild leukocytosis with WBC 10.6, no signs of infection, likely reactive -Follow-up with CBC

## 2021-09-17 NOTE — ED Notes (Signed)
Lab called to add on heparin level

## 2021-09-17 NOTE — Assessment & Plan Note (Signed)
-   IV hydralazine as needed -Continue Hyzzar

## 2021-09-17 NOTE — ED Provider Triage Note (Signed)
Emergency Medicine Provider Triage Evaluation Note  Tommy Rowe , a 66 y.o. male  was evaluated in triage.  Pt complains of chest pain for the past 30 hours.  Dizziness.  Apparently left heart cath a few months ago showed a 70% blockage to one of his coronaries..  No intervention at that time  Review of Systems  Positive:  Negative:   Physical Exam  BP 101/63 (BP Location: Left Arm)   Pulse 88   Temp 98.7 F (37.1 C) (Oral)   Resp 18   Ht 6' (1.829 m)   Wt 93 kg   SpO2 92%   BMI 27.81 kg/m  Gen:   Awake, no distress   Resp:  Normal effort  MSK:   Moves extremities without difficulty  Other:    Medical Decision Making  Medically screening exam initiated at 9:11 AM.  Appropriate orders placed.  Tommy Rowe was informed that the remainder of the evaluation will be completed by another provider, this initial triage assessment does not replace that evaluation, and the importance of remaining in the ED until their evaluation is complete.  EKG without STEMI.  Left bundle morphology.  We will give aspirin and send troponins and other diagnostics   Tommy Prairie, MD 09/17/21 [phone]

## 2021-09-18 ENCOUNTER — Inpatient Hospital Stay
Admit: 2021-09-18 | Discharge: 2021-09-18 | Disposition: A | Discharge status: Home / Self Care | Payer: Managed Care, Other (non HMO) | Attending: Internal Medicine | Admitting: Internal Medicine

## 2021-09-18 ENCOUNTER — Other Ambulatory Visit: Payer: Self-pay

## 2021-09-18 ENCOUNTER — Encounter
Admission: EM | Disposition: A | Discharge status: Home / Self Care | Payer: Self-pay | Source: Home / Self Care | Attending: Internal Medicine

## 2021-09-18 DIAGNOSIS — I10 Essential (primary) hypertension: Secondary | ICD-10-CM | POA: Diagnosis not present

## 2021-09-18 DIAGNOSIS — I214 Non-ST elevation (NSTEMI) myocardial infarction: Secondary | ICD-10-CM | POA: Diagnosis not present

## 2021-09-18 DIAGNOSIS — I482 Chronic atrial fibrillation, unspecified: Secondary | ICD-10-CM | POA: Diagnosis not present

## 2021-09-18 DIAGNOSIS — E785 Hyperlipidemia, unspecified: Secondary | ICD-10-CM | POA: Diagnosis not present

## 2021-09-18 HISTORY — PX: LEFT HEART CATH AND CORONARY ANGIOGRAPHY: CATH118249

## 2021-09-18 HISTORY — PX: CORONARY STENT INTERVENTION: CATH118234

## 2021-09-18 LAB — CBC
HCT: 40.9 % (ref 39.0–52.0)
HCT: 42.5 % (ref 39.0–52.0)
Hemoglobin: 13.9 g/dL (ref 13.0–17.0)
Hemoglobin: 14.4 g/dL (ref 13.0–17.0)
MCH: 31.2 pg (ref 26.0–34.0)
MCH: 31.1 pg (ref 26.0–34.0)
MCHC: 34 g/dL (ref 30.0–36.0)
MCHC: 33.9 g/dL (ref 30.0–36.0)
MCV: 91.7 fL (ref 80.0–100.0)
MCV: 91.8 fL (ref 80.0–100.0)
Platelets: 111 10*3/uL — ABNORMAL LOW (ref 150–400)
Platelets: 115 10*3/uL — ABNORMAL LOW (ref 150–400)
RBC: 4.46 MIL/uL (ref 4.22–5.81)
RBC: 4.63 MIL/uL (ref 4.22–5.81)
RDW: 12.8 % (ref 11.5–15.5)
RDW: 12.7 % (ref 11.5–15.5)
WBC: 10 10*3/uL (ref 4.0–10.5)
WBC: 10 10*3/uL (ref 4.0–10.5)
nRBC: 0 % (ref 0.0–0.2)
nRBC: 0 % (ref 0.0–0.2)

## 2021-09-18 LAB — HEPARIN LEVEL (UNFRACTIONATED): Heparin Unfractionated: >1.1 IU/mL — ABNORMAL HIGH (ref 0.30–0.70)

## 2021-09-18 LAB — BRAIN NATRIURETIC PEPTIDE: B Natriuretic Peptide: 305.9 pg/mL — ABNORMAL HIGH (ref 0.0–100.0)

## 2021-09-18 LAB — CREATININE, SERUM
Creatinine, Ser: 0.74 mg/dL (ref 0.61–1.24)
GFR, Estimated: >60 mL/min (ref >60)

## 2021-09-18 LAB — HIV ANTIBODY (ROUTINE TESTING W REFLEX): HIV Screen 4th Generation wRfx: NONREACTIVE

## 2021-09-18 LAB — POTASSIUM: Potassium: 3 mmol/L — ABNORMAL LOW (ref 3.5–5.1)

## 2021-09-18 LAB — MAGNESIUM: Magnesium: 2 mg/dL (ref 1.7–2.4)

## 2021-09-18 LAB — GLUCOSE, CAPILLARY: Glucose-Capillary: 107 mg/dL — ABNORMAL HIGH (ref 70–99)

## 2021-09-18 LAB — APTT
aPTT: 109 seconds — ABNORMAL HIGH (ref 24–36)
aPTT: 92 seconds — ABNORMAL HIGH (ref 24–36)

## 2021-09-18 LAB — POCT ACTIVATED CLOTTING TIME: Activated Clotting Time: 377 seconds

## 2021-09-18 SURGERY — LEFT HEART CATH AND CORONARY ANGIOGRAPHY
Anesthesia: Moderate Sedation

## 2021-09-18 MED ORDER — SODIUM CHLORIDE 0.9% FLUSH
3.0000 mL | Freq: Two times a day (BID) | INTRAVENOUS | Status: DC
  Administered 2021-09-19: 3 mL via INTRAVENOUS

## 2021-09-18 MED ORDER — ASPIRIN 81 MG PO CHEW
324.0000 mg | CHEWABLE_TABLET | Freq: Once | ORAL | Status: AC
Start: 2021-09-18 — End: 2021-09-18
  Administered 2021-09-18: 324 mg via ORAL
  Filled 2021-09-18: qty 4

## 2021-09-18 MED ORDER — SODIUM CHLORIDE 0.9 % WEIGHT BASED INFUSION: 3.0000 mL/kg/h | INTRAVENOUS | Status: DC

## 2021-09-18 MED ORDER — CLOPIDOGREL BISULFATE 75 MG PO TABS
ORAL_TABLET | ORAL | Status: DC | PRN
  Administered 2021-09-18: 600 mg via ORAL

## 2021-09-18 MED ORDER — SODIUM CHLORIDE 0.9 % IV SOLN: 250.0000 mL | INTRAVENOUS | Status: DC | PRN

## 2021-09-18 MED ORDER — AMIODARONE HCL 200 MG PO TABS
400.0000 mg | ORAL_TABLET | Freq: Two times a day (BID) | ORAL | Status: DC
  Filled 2021-09-18: qty 2

## 2021-09-18 MED ORDER — CLOPIDOGREL BISULFATE 75 MG PO TABS
ORAL_TABLET | ORAL | Status: AC
  Filled 2021-09-18: qty 8

## 2021-09-18 MED ORDER — DIGOXIN 0.25 MG/ML IJ SOLN
INTRAMUSCULAR | Status: AC
  Filled 2021-09-18: qty 2

## 2021-09-18 MED ORDER — METOPROLOL TARTRATE 5 MG/5ML IV SOLN
INTRAVENOUS | Status: AC
  Administered 2021-09-18: 2.5 mg via INTRAVENOUS
  Filled 2021-09-18: qty 5

## 2021-09-18 MED ORDER — SODIUM CHLORIDE 0.9 % IV BOLUS
500.0000 mL | Freq: Once | INTRAVENOUS | Status: DC
Start: 2021-09-18 — End: 2021-09-18

## 2021-09-18 MED ORDER — IOHEXOL 300 MG/ML  SOLN
INTRAMUSCULAR | Status: DC | PRN
  Administered 2021-09-18: 210 mL

## 2021-09-18 MED ORDER — POTASSIUM CHLORIDE CRYS ER 20 MEQ PO TBCR
40.0000 meq | EXTENDED_RELEASE_TABLET | Freq: Once | ORAL | Status: AC
  Administered 2021-09-18: 40 meq via ORAL
  Filled 2021-09-18: qty 2

## 2021-09-18 MED ORDER — SODIUM CHLORIDE 0.9 % IV SOLN: 0.2500 mg/kg/h | INTRAVENOUS | Status: DC

## 2021-09-18 MED ORDER — SODIUM CHLORIDE 0.9 % IV SOLN
0.2500 mg/kg/h | INTRAVENOUS | Status: AC
  Filled 2021-09-18: qty 250

## 2021-09-18 MED ORDER — LIDOCAINE HCL 1 % IJ SOLN
INTRAMUSCULAR | Status: AC
  Filled 2021-09-18: qty 20

## 2021-09-18 MED ORDER — CLOPIDOGREL BISULFATE 75 MG PO TABS
75.0000 mg | ORAL_TABLET | Freq: Every day | ORAL | Status: DC
Start: 2021-09-19 — End: 2021-09-19
  Administered 2021-09-19: 75 mg via ORAL
  Filled 2021-09-18: qty 1

## 2021-09-18 MED ORDER — SODIUM CHLORIDE 0.9% FLUSH: 3.0000 mL | INTRAVENOUS | Status: DC | PRN

## 2021-09-18 MED ORDER — SODIUM CHLORIDE 0.9 % WEIGHT BASED INFUSION: 1.0000 mL/kg/h | INTRAVENOUS | Status: DC

## 2021-09-18 MED ORDER — ONDANSETRON HCL 4 MG/2ML IJ SOLN: 4.0000 mg | Freq: Four times a day (QID) | INTRAMUSCULAR | Status: DC | PRN

## 2021-09-18 MED ORDER — MIDAZOLAM HCL 2 MG/2ML IJ SOLN
INTRAMUSCULAR | Status: DC | PRN
  Administered 2021-09-18: 1 mg via INTRAVENOUS

## 2021-09-18 MED ORDER — DIGOXIN 0.25 MG/ML IJ SOLN
0.5000 mg | Freq: Once | INTRAMUSCULAR | Status: AC
  Filled 2021-09-18: qty 2

## 2021-09-18 MED ORDER — LIDOCAINE HCL (PF) 1 % IJ SOLN
INTRAMUSCULAR | Status: DC | PRN
  Administered 2021-09-18: 10 mL

## 2021-09-18 MED ORDER — AMIODARONE LOAD VIA INFUSION
150.0000 mg | Freq: Once | INTRAVENOUS | Status: AC
  Administered 2021-09-18: 150 mg via INTRAVENOUS
  Filled 2021-09-18: qty 83.34

## 2021-09-18 MED ORDER — AMIODARONE HCL IN DEXTROSE 360-4.14 MG/200ML-% IV SOLN
INTRAVENOUS | Status: AC
  Filled 2021-09-18: qty 200

## 2021-09-18 MED ORDER — MIDAZOLAM HCL 2 MG/2ML IJ SOLN
INTRAMUSCULAR | Status: AC
  Filled 2021-09-18: qty 2

## 2021-09-18 MED ORDER — AMIODARONE HCL IN DEXTROSE 360-4.14 MG/200ML-% IV SOLN
30.0000 mg/h | INTRAVENOUS | Status: DC
  Administered 2021-09-19 (×2): 30 mg/h via INTRAVENOUS
  Filled 2021-09-18: qty 200

## 2021-09-18 MED ORDER — SODIUM CHLORIDE 0.9 % IV SOLN
0.2500 mg/kg/h | INTRAVENOUS | Status: DC
  Administered 2021-09-18: 0.25 mg/kg/h via INTRAVENOUS
  Filled 2021-09-18: qty 250

## 2021-09-18 MED ORDER — FENTANYL CITRATE (PF) 100 MCG/2ML IJ SOLN
INTRAMUSCULAR | Status: AC
  Filled 2021-09-18: qty 2

## 2021-09-18 MED ORDER — FENTANYL CITRATE (PF) 100 MCG/2ML IJ SOLN
INTRAMUSCULAR | Status: DC | PRN
Start: 2021-09-18 — End: 2021-09-18
  Administered 2021-09-18 (×2): 25 ug via INTRAVENOUS

## 2021-09-18 MED ORDER — POTASSIUM CHLORIDE 10 MEQ/100ML IV SOLN
10.0000 meq | INTRAVENOUS | Status: AC
  Administered 2021-09-18 – 2021-09-19 (×2): 10 meq via INTRAVENOUS
  Filled 2021-09-18 (×2): qty 100

## 2021-09-18 MED ORDER — AMIODARONE HCL IN DEXTROSE 360-4.14 MG/200ML-% IV SOLN
60.0000 mg/h | INTRAVENOUS | Status: DC
  Administered 2021-09-18 (×2): 60 mg/h via INTRAVENOUS
  Filled 2021-09-18: qty 200

## 2021-09-18 MED ORDER — HYDRALAZINE HCL 20 MG/ML IJ SOLN: 10.0000 mg | INTRAMUSCULAR | Status: DC | PRN

## 2021-09-18 MED ORDER — SODIUM CHLORIDE 0.9 % IV SOLN
INTRAVENOUS | Status: AC | PRN
  Administered 2021-09-18: 1.75 mg/kg/h via INTRAVENOUS

## 2021-09-18 MED ORDER — BIVALIRUDIN TRIFLUOROACETATE 250 MG IV SOLR
INTRAVENOUS | Status: AC
  Filled 2021-09-18: qty 250

## 2021-09-18 MED ORDER — HEPARIN (PORCINE) IN NACL 1000-0.9 UT/500ML-% IV SOLN
INTRAVENOUS | Status: DC | PRN
  Administered 2021-09-18: 1000 mL

## 2021-09-18 MED ORDER — BIVALIRUDIN BOLUS VIA INFUSION - CUPID
INTRAVENOUS | Status: DC | PRN
  Administered 2021-09-18: 68.1 mg via INTRAVENOUS

## 2021-09-18 MED ORDER — METOPROLOL TARTRATE 5 MG/5ML IV SOLN: 2.5000 mg | Freq: Once | INTRAVENOUS | Status: AC

## 2021-09-18 MED ORDER — ASPIRIN 81 MG PO CHEW
81.0000 mg | CHEWABLE_TABLET | Freq: Every day | ORAL | Status: DC
  Administered 2021-09-19: 81 mg via ORAL
  Filled 2021-09-18: qty 1

## 2021-09-18 MED ORDER — DIGOXIN 0.25 MG/ML IJ SOLN
0.5000 mg | Freq: Once | INTRAMUSCULAR | Status: AC
  Administered 2021-09-18: 0.5 mg via INTRAVENOUS

## 2021-09-18 MED ORDER — LABETALOL HCL 5 MG/ML IV SOLN: 10.0000 mg | INTRAVENOUS | Status: DC | PRN

## 2021-09-18 MED ORDER — ASPIRIN 81 MG PO CHEW: 81.0000 mg | CHEWABLE_TABLET | ORAL | Status: DC

## 2021-09-18 MED ORDER — METOPROLOL TARTRATE 5 MG/5ML IV SOLN
2.5000 mg | Freq: Once | INTRAVENOUS | Status: AC
  Administered 2021-09-18: 2.5 mg via INTRAVENOUS

## 2021-09-18 MED ORDER — ACETAMINOPHEN 325 MG PO TABS: 650.0000 mg | ORAL_TABLET | ORAL | Status: DC | PRN

## 2021-09-18 SURGICAL SUPPLY — 18 items
BALLN EUPHORA RX 2.5X15 (BALLOONS) ×2
BALLN ~~LOC~~ TREK NEO RX 2.5X15 (BALLOONS) ×1 IMPLANT
BALLOON EUPHORA RX 2.5X15 (BALLOONS) IMPLANT
CATH INFINITI 5FR MULTPACK ANG (CATHETERS) ×1 IMPLANT
CATH VISTA GUIDE 6FR XB3.5 (CATHETERS) ×1 IMPLANT
DEVICE CLOSURE MYNXGRIP 6/7F (Vascular Products) ×1 IMPLANT
NDL PERC 18GX7CM (NEEDLE) IMPLANT
NEEDLE PERC 18GX7CM (NEEDLE) ×2 IMPLANT
PACK CARDIAC CATH (CUSTOM PROCEDURE TRAY) ×2 IMPLANT
PROTECTION STATION PRESSURIZED (MISCELLANEOUS) ×2
SET ATX SIMPLICITY (MISCELLANEOUS) ×1 IMPLANT
SHEATH AVANTI 5FR X 11CM (SHEATH) ×1 IMPLANT
SHEATH AVANTI 6FR X 11CM (SHEATH) ×1 IMPLANT
STATION PROTECTION PRESSURIZED (MISCELLANEOUS) IMPLANT
STENT ONYX FRONTIER 2.5X22 (Permanent Stent) ×1 IMPLANT
TUBING CIL FLEX 10 FLL-RA (TUBING) ×1 IMPLANT
WIRE G HI TQ BMW 190 (WIRE) ×1 IMPLANT
WIRE GUIDERIGHT .035X150 (WIRE) ×1 IMPLANT

## 2021-09-18 NOTE — Progress Notes (Signed)
ANTICOAGULATION CONSULT NOTE - Initial Consult  Pharmacy Consult for IV heparin Indication: chest pain/ACS  No Known Allergies  Patient Measurements: Height: 6' (182.9 cm) Weight: 93 kg (205 lb 0.4 oz) IBW/kg (Calculated) : 77.6 Heparin Dosing Weight: 93 kg  Vital Signs: Temp: 98.5 F (36.9 C) (06/19 0030) Temp Source: Oral (06/18 2037) BP: 115/65 (06/19 0030) Pulse Rate: 70 (06/19 0030)  Labs: Recent Labs    09/17/21 0907 09/17/21 1012 09/17/21 1122 09/17/21 1539 09/17/21 1746 09/18/21 0046  HGB 16.3  --   --   --   --   --   HCT 48.5  --   --   --   --   --   PLT 167  --   --   --   --   --   APTT  --  31  --   --  54* 109*  LABPROT  --  16.5*  --   --   --   --   INR  --  1.3*  --   --   --   --   HEPARINUNFRC  --  >1.10*  --   --   --   --   CREATININE 0.89  --   --   --   --   --   TROPONINIHS 13,714*  --  13,561* 28,413* 16,413*  --      Estimated Creatinine Clearance: 92 mL/min (by C-G formula based on SCr of 0.89 mg/dL).   Medical History: Past Medical History:  Diagnosis Date   A-fib (HCC)    Anxiety    HLD (hyperlipidemia)    HTN (hypertension)    Medications:  PRN: acetaminophen, hydrALAZINE, morphine injection, nitroGLYCERIN, ondansetron (ZOFRAN) IV  PTA: apixaban (Eliquis) per my chart review (reported last dose 09/17/21 per med rec)  Assessment: 66 year old male presenting with central chest pressure and concern for NSTEMI. Troponins significantly elevated.  Baseline CBC stable, aPTT and heparin level elevated.   Goal of Therapy:  aPTT 66-102 seconds Monitor platelets by anticoagulation protocol: Yes   Plan:  6/19 0046 aPTT = 109 sec, supratherapeutic  Decrease heparin infusion to 1450 units/hr Will monitor via aPTT until correlation with heparin levels. 6-hour aPTT following rate change CBC daily    Otelia Sergeant, PharmD, [facility] 09/18/2021 2:19 AM

## 2021-09-18 NOTE — Progress Notes (Signed)
ANTICOAGULATION CONSULT NOTE - Initial Consult  Pharmacy Consult for IV heparin Indication: chest pain/ACS  No Known Allergies  Patient Measurements: Height: 6' (182.9 cm) Weight: 90.8 kg (200 lb 1.6 oz) IBW/kg (Calculated) : 77.6 Heparin Dosing Weight: 93 kg  Vital Signs: Temp: 98.1 F (36.7 C) (06/19 0726) Temp Source: Oral (06/19 0457) BP: 116/64 (06/19 0726) Pulse Rate: 67 (06/19 0726)  Labs: Recent Labs    09/17/21 0907 09/17/21 0907 09/17/21 1012 09/17/21 1122 09/17/21 1539 09/17/21 1746 09/18/21 0046 09/18/21 0452 09/18/21 0839  HGB 16.3  --   --   --   --   --   --  13.9  --   HCT 48.5  --   --   --   --   --   --  40.9  --   PLT 167  --   --   --   --   --   --  111*  --   APTT  --    < > 31  --   --  54* 109*  --  92*  LABPROT  --   --  16.5*  --   --   --   --   --   --   INR  --   --  1.3*  --   --   --   --   --   --   HEPARINUNFRC  --   --  >1.10*  --   --   --   --   --  >1.10*  CREATININE 0.89  --   --   --   --   --   --  0.74  --   TROPONINIHS 13,714*  --   --  13,561* 78,295* 16,413*  --   --   --    < > = values in this interval not displayed.     Estimated Creatinine Clearance: 102.4 mL/min (by C-G formula based on SCr of 0.74 mg/dL).   Medical History: Past Medical History:  Diagnosis Date   A-fib (HCC)    Anxiety    HLD (hyperlipidemia)    HTN (hypertension)    Medications:  PRN: sodium chloride, acetaminophen, hydrALAZINE, morphine injection, nitroGLYCERIN, ondansetron (ZOFRAN) IV, sodium chloride flush  PTA: apixaban (Eliquis) per my chart review (reported last dose 09/17/21 per med rec)  Assessment: 66 year old male presenting with central chest pressure and concern for NSTEMI. Troponins significantly elevated.  Baseline CBC stable, aPTT and heparin level elevated.   Date Time HL/aPTT Rate/comment 6/19  0046  aPTT 109  1550 un/hr, supratherapeutic  6/19 0839 aPTT 92 1450 un/hr, therapeutic x1  Goal of Therapy:  aPTT  66-102 seconds Monitor platelets by anticoagulation protocol: Yes   Plan:  aPTT 92, therapeutic Continue heparin infusion at 1450 units/hr Will monitor via aPTT until correlation with heparin levels. Check confirmatory aPTT in 6 hours Monitor daily aPTT, CBC, s/s of bleed  Derrek Gu, PharmD 09/18/2021 9:59 AM

## 2021-09-18 NOTE — Progress Notes (Signed)
Called regarding patient following catheterization for which he received DES to mid LAD. Post procedure he developed AF with RVR and was started on amiodarone at 8 pm per nursing report. He remains in AF currently with rates of 140. He has no chest pain or shortness of breath. BP is low most recently. No evidence of swelling at access site (femoral). Recommend continuing amiodarone, and can consider 5 mg iv metoprolol if Bp improves. Ch eck cbc now. Would avoid significant iv fluid administration with low Ef of 35%. Please have patient evaluated by in house medicine doctor and consult ICU team if hypotension persists. Contact me again if patient worsens.   Please obtain EKG now and if chest pain develops.  Sena Slate, MD

## 2021-09-18 NOTE — Final Consult Note (Signed)
ANTICOAGULATION CONSULT NOTE - Initial Consult  Pharmacy Consult for IV heparin Indication: chest pain/ACS  No Known Allergies  Patient Measurements: Height: 6' (182.9 cm) Weight: 90.8 kg (200 lb 1.6 oz) IBW/kg (Calculated) : 77.6 Heparin Dosing Weight: 93 kg  Vital Signs: Temp: 99.1 F (37.3 C) (06/19 1505) Temp Source: Oral (06/19 1505) BP: 106/80 (06/19 1945) Pulse Rate: 145 (06/19 1945)  Labs: Recent Labs    09/17/21 0907 09/17/21 0907 09/17/21 1012 09/17/21 1122 09/17/21 1539 09/17/21 1746 09/18/21 0046 09/18/21 0452 09/18/21 0839  HGB 16.3  --   --   --   --   --   --  13.9  --   HCT 48.5  --   --   --   --   --   --  40.9  --   PLT 167  --   --   --   --   --   --  111*  --   APTT  --    < > 31  --   --  54* 109*  --  92*  LABPROT  --   --  16.5*  --   --   --   --   --   --   INR  --   --  1.3*  --   --   --   --   --   --   HEPARINUNFRC  --   --  >1.10*  --   --   --   --   --  >1.10*  CREATININE 0.89  --   --   --   --   --   --  0.74  --   TROPONINIHS 13,714*  --   --  13,561* 63,149* 16,413*  --   --   --    < > = values in this interval not displayed.     Estimated Creatinine Clearance: 102.4 mL/min (by C-G formula based on SCr of 0.74 mg/dL).   Medical History: Past Medical History:  Diagnosis Date   A-fib (HCC)    Anxiety    HLD (hyperlipidemia)    HTN (hypertension)    Medications:  PRN: sodium chloride, [START ON 09/19/2021] sodium chloride, [MAR Hold] acetaminophen, acetaminophen, bivalirudin (ANGIOMAX) 250 mg in sodium chloride 0.9 % 50 mL (5 mg/mL) infusion, bivalirudin, clopidogrel, fentaNYL, Heparin (Porcine) in NaCl, hydrALAZINE, [MAR Hold] hydrALAZINE, iohexol, labetalol, lidocaine (PF), midazolam, [MAR Hold]  morphine injection, [MAR Hold] nitroGLYCERIN, [MAR Hold] ondansetron (ZOFRAN) IV, ondansetron (ZOFRAN) IV, sodium chloride flush, [START ON 09/19/2021] sodium chloride flush  PTA: apixaban (Eliquis) per my chart review (reported  last dose 09/17/21 per med rec)  Assessment: 66 year old male presenting with central chest pressure and concern for NSTEMI. Troponins significantly elevated.  Baseline CBC stable, aPTT and heparin level elevated.   Date Time HL/aPTT Rate/comment 6/19  0046  aPTT 109  1550 un/hr, supratherapeutic  6/19 0839 aPTT 92 1450 un/hr, therapeutic x1  Goal of Therapy:  aPTT 66-102 seconds Monitor platelets by anticoagulation protocol: Yes   Plan:  Per discussion with Dr. Juliann Pares Discontinue heparin gtt, consult, labs tonight (6/19). Post-operatively, continue Bivalirudin for 2hrs at reduced rate 0.25mg /kg/hr; then stop. Anticipate resuming Eliquis tomorrow (6/20), as part of triple therapy Asa 81/plavix 75 mg/ Eliquis 5 mg bid for 30 days, then d/c aspirin.  Martyn Malay, PharmD 09/18/2021 7:56 PM

## 2021-09-18 NOTE — Progress Notes (Signed)
[facility] PROGRESS NOTE    Progress Note  Tommy Rowe  [id number] DOB: [birth year] DOA: 09/17/2021 PCP: [facility]     Brief Narrative:   Tommy Rowe is an 66 y.o. male past medical history significant for essential hypertension, hyperlipidemia atrial fibrillation on Eliquis depression with anxiety and diplopia presents with chest pain intermittently worse with exertion this started 2 days prior to admission.  In the ED troponins were 1300, twelve-lead EKG showed T wave inversions in lead 1, V4, V5 and V6, with what appears to be elevation in V1 through V4 with poor R wave progression he was started on aspirin, Plavix continue statins and heparin cardiology was consulted. Assessment/Plan:   NSTEMI (non-ST elevated myocardial infarction) [facility]) Cardiology was consulted Dr. Clayborn Bigness who recommended a cardiac cath probably on 09/18/2021. Continue IV heparin, aspirin morphine statin and nitroglycerin as needed. Cardiac biomarkers are trending up.  Atrial fibrillation, chronic (HCC) Rate controlled Eliquis has been held, now on amiodarone and heparin.  Essential hypertension: Continue Hyzaar and hydralazine as needed.  Hyperlipidemia: continue Crestor.  Depression with anxiety: Continue Lexapro.  Leukocytosis: Mild, now with a fever.   DVT prophylaxis: heparin Family Communication:none Status is: Inpatient Remains inpatient appropriate because: NSTEMI    Code Status:     Code Status Orders  (From admission, onward)           Start     Ordered   09/17/21 1200  Full code  Continuous        09/17/21 1200           Code Status History     Date Active Date Inactive Code Status Order ID Comments User Context   04/11/2021 1230 04/11/2021 1911 Full Code [id number]  Dionisio David, MD Inpatient   08/26/2019 1249 08/27/2019 1942 Full Code [id number]  Lonell Face, NP Inpatient         IV Access:   Peripheral  IV   Procedures and diagnostic studies:   DG Chest 2 View  Result Date: 09/17/2021 CLINICAL DATA:  66 year old male with history of chest pain. EXAM: CHEST - 2 VIEW COMPARISON:  No priors. FINDINGS: Lung volumes are normal. No consolidative airspace disease. No pleural effusions. No pneumothorax. No pulmonary nodule or mass noted. Pulmonary vasculature and the cardiomediastinal silhouette are within normal limits. IMPRESSION: No radiographic evidence of acute cardiopulmonary disease. Electronically Signed   By: Vinnie Langton M.D.   On: 09/17/2021 09:49     Medical Consultants:   None.   Subjective:    Curt Bears chest pain and SOB free  Objective:    Vitals:   09/17/21 2037 09/18/21 0030 09/18/21 0457 09/18/21 0650  BP: 113/65 115/65 113/64   Pulse: 61 70 67   Resp: 17 18    Temp: 98.6 F (37 C) 98.5 F (36.9 C) 98.4 F (36.9 C)   TempSrc: Oral  Oral   SpO2: 96% 92% 94%   Weight:    90.8 kg  Height:       SpO2: 94 %   Intake/Output Summary (Last 24 hours) at 09/18/2021 0701 Last data filed at 09/17/2021 1833 Gross per 24 hour  Intake 484.93 ml  Output --  Net 484.93 ml   Filed Weights   09/17/21 0901 09/18/21 0650  Weight: 93 kg 90.8 kg    Exam: General exam: In no acute distress. Respiratory system: Good air movement and clear to auscultation. Cardiovascular system: S1 & S2 heard, RRR. No  JVD. Gastrointestinal system: Abdomen is nondistended, soft and nontender.  Extremities: No pedal edema. Skin: No rashes, lesions or ulcers Psychiatry: Judgement and insight appear normal. Mood & affect appropriate.    Data Reviewed:    Labs: Basic Metabolic Panel: Recent Labs  Lab 09/17/21 0907 09/18/21 0452  NA 137  --   K 3.9  --   CL 101  --   CO2 27  --   GLUCOSE 163*  --   BUN 24*  --   CREATININE 0.89 0.74  CALCIUM 9.1  --    GFR Estimated Creatinine Clearance: 102.4 mL/min (by C-G formula based on SCr of 0.74 mg/dL). Liver Function  Tests: No results for input(s): "AST", "ALT", "ALKPHOS", "BILITOT", "PROT", "ALBUMIN" in the last 168 hours. No results for input(s): "LIPASE", "AMYLASE" in the last 168 hours. No results for input(s): "AMMONIA" in the last 168 hours. Coagulation profile Recent Labs  Lab 09/17/21 1012  INR 1.3*   COVID-19 Labs  No results for input(s): "DDIMER", "FERRITIN", "LDH", "CRP" in the last 72 hours.  Lab Results  Component Value Date   SARSCOV2NAA NEGATIVE 08/24/2019    CBC: Recent Labs  Lab 09/17/21 0907 09/18/21 0452  WBC 10.6* 10.0  HGB 16.3 13.9  HCT 48.5 40.9  MCV 92.9 91.7  PLT 167 111*   Cardiac Enzymes: No results for input(s): "CKTOTAL", "CKMB", "CKMBINDEX", "TROPONINI" in the last 168 hours. BNP (last 3 results) No results for input(s): "PROBNP" in the last 8760 hours. CBG: No results for input(s): "GLUCAP" in the last 168 hours. D-Dimer: No results for input(s): "DDIMER" in the last 72 hours. Hgb A1c: Recent Labs    09/17/21 0907  HGBA1C 5.7*   Lipid Profile: Recent Labs    09/17/21 0907  CHOL 109  HDL 49  LDLCALC 55  TRIG 23  CHOLHDL 2.2   Thyroid function studies: No results for input(s): "TSH", "T4TOTAL", "T3FREE", "THYROIDAB" in the last 72 hours.  Invalid input(s): "FREET3" Anemia work up: No results for input(s): "VITAMINB12", "FOLATE", "FERRITIN", "TIBC", "IRON", "RETICCTPCT" in the last 72 hours. Sepsis Labs: Recent Labs  Lab 09/17/21 0907 09/18/21 0452  WBC 10.6* 10.0   Microbiology Recent Results (from the past 240 hour(s))  MRSA Next Gen by PCR, Nasal     Status: Abnormal   Collection Time: 09/17/21  6:22 PM   Specimen: Nasal Mucosa; Nasal Swab  Result Value Ref Range Status   MRSA by PCR Next Gen DETECTED (A) NOT DETECTED Final    Comment: CRITICAL RESULT CALLED TO, READ BACK BY AND VERIFIED WITH: RN CRYSTAL [facility] AT 1940 09/17/2021 GAA (NOTE) The GeneXpert MRSA Assay (FDA approved for NASAL specimens only), is one  component of a comprehensive MRSA colonization surveillance program. It is not intended to diagnose MRSA infection nor to guide or monitor treatment for MRSA infections. Test performance is not FDA approved in patients less than 8 years old. Performed at [facility], [address] [place], [place] [address]      Medications:    amiodarone  200 mg Oral Daily   aspirin  324 mg Oral Once   aspirin  162.5 mg Oral QHS   Chlorhexidine Gluconate Cloth  6 each Topical Q0600   doxylamine (Sleep)  12.5 mg Oral QHS   escitalopram  20 mg Oral QHS   losartan  100 mg Oral Daily   And   hydrochlorothiazide  25 mg Oral Daily   multivitamin with minerals  1 tablet Oral Daily  mupirocin ointment  1 Application Nasal BID   rosuvastatin  20 mg Oral QHS   sodium chloride flush  3 mL Intravenous Q12H   Continuous Infusions:  sodium chloride 75 mL/hr at 09/17/21 1833   ceFEPime (MAXIPIME) IV 2 g (09/18/21 0500)   heparin 1,450 Units/hr (09/18/21 0257)   vancomycin 1,500 mg (09/18/21 0615)      LOS: 1 day   Marinda Elk  [facility]  09/18/2021, 7:01 AM

## 2021-09-18 NOTE — Progress Notes (Signed)
[facility] Cardiology    SUBJECTIVE: Patient states to be doing better improved chest discomfort anginal no worsening shortness of breath recently had a cardiac cath done about 6 months ago which showed just mild coronary disease in the LAD with preserved left ventricular function now appears to have been admitted with a non-STEMI but feels much more improved than before resting comfortably in bed   Vitals:   09/18/21 0457 09/18/21 0650 09/18/21 0726 09/18/21 1121  BP: 113/64  116/64 98/63  Pulse: 67  67 63  Resp:   17 17  Temp: 98.4 F (36.9 C)  98.1 F (36.7 C) (!) 97.4 F (36.3 C)  TempSrc: Oral   Oral  SpO2: 94%  96% 96%  Weight:  90.8 kg    Height:         Intake/Output Summary (Last 24 hours) at 09/18/2021 1156 Last data filed at 09/18/2021 1121 Gross per 24 hour  Intake 484.93 ml  Output 0 ml  Net 484.93 ml      PHYSICAL EXAM  General: Well developed, well nourished, in no acute distress HEENT:  Normocephalic and atramatic Neck:  No JVD.  Lungs: Clear bilaterally to auscultation and percussion. Heart: HRRR . Normal S1 and S2 without gallops or murmurs.  Abdomen: Bowel sounds are positive, abdomen soft and non-tender  Msk:  Back normal, normal gait. Normal strength and tone for age. Extremities: No clubbing, cyanosis or edema.   Neuro: Alert and oriented X 3. Psych:  Good affect, responds appropriately   LABS: Basic Metabolic Panel: Recent Labs    09/17/21 0907 09/18/21 0452  NA 137  --   K 3.9  --   CL 101  --   CO2 27  --   GLUCOSE 163*  --   BUN 24*  --   CREATININE 0.89 0.74  CALCIUM 9.1  --    Liver Function Tests: No results for input(s): "AST", "ALT", "ALKPHOS", "BILITOT", "PROT", "ALBUMIN" in the last 72 hours. No results for input(s): "LIPASE", "AMYLASE" in the last 72 hours. CBC: Recent Labs    09/17/21 0907 09/18/21 0452  WBC 10.6* 10.0  HGB 16.3 13.9  HCT 48.5 40.9  MCV 92.9 91.7  PLT 167 111*   Cardiac Enzymes: No results for  input(s): "CKTOTAL", "CKMB", "CKMBINDEX", "TROPONINI" in the last 72 hours. BNP: Invalid input(s): "POCBNP" D-Dimer: No results for input(s): "DDIMER" in the last 72 hours. Hemoglobin A1C: Recent Labs    09/17/21 0907  HGBA1C 5.7*   Fasting Lipid Panel: Recent Labs    09/17/21 0907  CHOL 109  HDL 49  LDLCALC 55  TRIG 23  CHOLHDL 2.2   Thyroid Function Tests: No results for input(s): "TSH", "T4TOTAL", "T3FREE", "THYROIDAB" in the last 72 hours.  Invalid input(s): "FREET3" Anemia Panel: No results for input(s): "VITAMINB12", "FOLATE", "FERRITIN", "TIBC", "IRON", "RETICCTPCT" in the last 72 hours.  DG Chest 2 View  Result Date: 09/17/2021 CLINICAL DATA:  66 year old male with history of chest pain. EXAM: CHEST - 2 VIEW COMPARISON:  No priors. FINDINGS: Lung volumes are normal. No consolidative airspace disease. No pleural effusions. No pneumothorax. No pulmonary nodule or mass noted. Pulmonary vasculature and the cardiomediastinal silhouette are within normal limits. IMPRESSION: No radiographic evidence of acute cardiopulmonary disease. Electronically Signed   By: Trudie Reed M.D.   On: 09/17/2021 09:49     Echo pending  TELEMETRY: Normal sinus rhythm left bundle branch block nonspecific ST-T wave changes:  ASSESSMENT AND PLAN:  Principal Problem:  NSTEMI (non-ST elevated myocardial infarction) (HCC) Active Problems:   Depression with anxiety   Atrial fibrillation, chronic (HCC)   HLD (hyperlipidemia)   HTN (hypertension)   Leukocytosis    Plan Agree with admit to telemetry follow-up EKGs and troponin Continue heparin therapy for anticoagulation for non-STEMI Consider echocardiogram for evaluation non-STEMI LV function wall motion valvular structure Continue aspirin Crestor losartan consider adding a beta-blocker Hold Eliquis prior to cardiac cath Continue amiodarone for rhythm management for atrial fibrillation Hypertension management with losartan HCTZ  consider adding a beta-blocker Proceed with cardiac cath prior to discharge   Alwyn Pea, MD 09/18/2021 11:56 AM

## 2021-09-19 ENCOUNTER — Encounter: Payer: Self-pay | Admitting: Internal Medicine

## 2021-09-19 ENCOUNTER — Inpatient Hospital Stay
Admit: 2021-09-19 | Discharge: 2021-09-19 | Disposition: A | Discharge status: Home / Self Care | Payer: Managed Care, Other (non HMO) | Attending: Internal Medicine | Admitting: Internal Medicine

## 2021-09-19 DIAGNOSIS — I482 Chronic atrial fibrillation, unspecified: Secondary | ICD-10-CM | POA: Diagnosis not present

## 2021-09-19 DIAGNOSIS — I214 Non-ST elevation (NSTEMI) myocardial infarction: Secondary | ICD-10-CM | POA: Diagnosis not present

## 2021-09-19 DIAGNOSIS — I10 Essential (primary) hypertension: Secondary | ICD-10-CM | POA: Diagnosis not present

## 2021-09-19 DIAGNOSIS — E785 Hyperlipidemia, unspecified: Secondary | ICD-10-CM | POA: Diagnosis not present

## 2021-09-19 LAB — ECHOCARDIOGRAM COMPLETE
AR max vel: 2.63 cm2
AV Area VTI: 3.03 cm2
AV Area mean vel: 2.77 cm2
AV Mean grad: 4 mmHg
AV Peak grad: 6.7 mmHg
Ao pk vel: 1.29 m/s
Area-P 1/2: 3.24 cm2
Calc EF: 35.7 %
Height: 72 in
MV VTI: 2.04 cm2
S' Lateral: 3.3 cm
Single Plane A2C EF: 22.8 %
Single Plane A4C EF: 48.4 %
Weight: 3201.6 oz

## 2021-09-19 LAB — CBC
HCT: 40.1 % (ref 39.0–52.0)
Hemoglobin: 13.7 g/dL (ref 13.0–17.0)
MCH: 31.6 pg (ref 26.0–34.0)
MCHC: 34.2 g/dL (ref 30.0–36.0)
MCV: 92.4 fL (ref 80.0–100.0)
Platelets: 107 10*3/uL — ABNORMAL LOW (ref 150–400)
RBC: 4.34 MIL/uL (ref 4.22–5.81)
RDW: 12.7 % (ref 11.5–15.5)
WBC: 10 10*3/uL (ref 4.0–10.5)
nRBC: 0 % (ref 0.0–0.2)

## 2021-09-19 LAB — BASIC METABOLIC PANEL
Anion gap: 10 (ref 5–15)
BUN: 19 mg/dL (ref 8–23)
CO2: 21 mmol/L — ABNORMAL LOW (ref 22–32)
Calcium: 7.7 mg/dL — ABNORMAL LOW (ref 8.9–10.3)
Chloride: 107 mmol/L (ref 98–111)
Creatinine, Ser: 0.82 mg/dL (ref 0.61–1.24)
GFR, Estimated: >60 mL/min (ref >60)
Glucose, Bld: 110 mg/dL — ABNORMAL HIGH (ref 70–99)
Potassium: 2.9 mmol/L — ABNORMAL LOW (ref 3.5–5.1)
Sodium: 138 mmol/L (ref 135–145)

## 2021-09-19 LAB — MAGNESIUM: Magnesium: 1.9 mg/dL (ref 1.7–2.4)

## 2021-09-19 MED ORDER — LOSARTAN POTASSIUM 50 MG PO TABS: 25.0000 mg | ORAL_TABLET | Freq: Every day | ORAL | Status: DC

## 2021-09-19 MED ORDER — AMIODARONE HCL 200 MG PO TABS
200.0000 mg | ORAL_TABLET | Freq: Every day | ORAL | Status: DC
  Administered 2021-09-19: 200 mg via ORAL
  Filled 2021-09-19: qty 1

## 2021-09-19 MED ORDER — POTASSIUM CHLORIDE 10 MEQ/100ML IV SOLN
10.0000 meq | INTRAVENOUS | Status: DC
  Filled 2021-09-19 (×4): qty 100

## 2021-09-19 MED ORDER — POTASSIUM CHLORIDE CRYS ER 20 MEQ PO TBCR: 20.0000 meq | EXTENDED_RELEASE_TABLET | Freq: Two times a day (BID) | ORAL | 0 refills | Status: DC

## 2021-09-19 MED ORDER — MUPIROCIN 2 % EX OINT: 1.0000 | TOPICAL_OINTMENT | Freq: Two times a day (BID) | CUTANEOUS | 0 refills | Status: DC

## 2021-09-19 MED ORDER — CLOPIDOGREL BISULFATE 75 MG PO TABS: 75.0000 mg | ORAL_TABLET | Freq: Every day | ORAL | 3 refills | Status: DC

## 2021-09-19 MED ORDER — NITROGLYCERIN 0.4 MG SL SUBL: 0.4000 mg | SUBLINGUAL_TABLET | SUBLINGUAL | 12 refills | Status: DC | PRN

## 2021-09-19 MED ORDER — ROSUVASTATIN CALCIUM 10 MG PO TABS: 40.0000 mg | ORAL_TABLET | Freq: Every day | ORAL | Status: DC

## 2021-09-19 MED ORDER — APIXABAN 5 MG PO TABS
5.0000 mg | ORAL_TABLET | Freq: Two times a day (BID) | ORAL | Status: DC
Start: 2021-09-19 — End: 2021-09-19
  Administered 2021-09-19: 5 mg via ORAL
  Filled 2021-09-19: qty 1

## 2021-09-19 MED ORDER — POTASSIUM CHLORIDE CRYS ER 20 MEQ PO TBCR
40.0000 meq | EXTENDED_RELEASE_TABLET | Freq: Two times a day (BID) | ORAL | Status: DC
  Administered 2021-09-19: 40 meq via ORAL
  Filled 2021-09-19: qty 2

## 2021-09-19 MED ORDER — ASPIRIN 81 MG PO TBEC: 81.0000 mg | DELAYED_RELEASE_TABLET | Freq: Every day | ORAL | 2 refills | Status: DC

## 2021-09-19 NOTE — Discharge Summary (Signed)
Physician Discharge Summary      Patient ID: Tommy Rowe MRN: [id number] DOB/AGE: 11/08/1955 66 y.o.  Admit date: 09/17/2021 Discharge date: 09/19/2021  Primary Discharge Diagnosis non-STEMI Secondary Discharge Diagnosis coronary artery disease hypokalemia  Significant Diagnostic Studies: labs: Troponins, cardiac graphics: ECG: Left bundle branch block and Echocardiogram: Depressed left ventricular function , angiography: Cardiac cath subsequent PCI and stent cardiology, and yes  Consults: cardiology  Hospital Course: Patient was admitted with unstable anginal symptoms EKG had left bundle which was unreliable patient subsequently developed elevated troponins over 15,000 patient was placed on heparin placed on telemetry subsequent underwent cardiac cath that showed 100% occlusion of the mid LAD he underwent PCI and stent with a DES relieving the lesion back to 0 and restoring TIMI-3 flow from TIMI 0 patient had a large anterior apical wall motion abnormality hopefully is only temporary and will have follow-up with echocardiogram patient much improved he had a bout of rapid atrial fibrillation requiring IV amiodarone and subsequently had improved rate back into the 70s and no further pain patient is being discharged home on p.o. amiodarone resumed Eliquis kept on Plavix and aspirin aspirin which is to be discontinued after about 4 weeks.  Patient was given supplemental potassium to be taken over the next week   Discharge Exam: Blood pressure 110/77, pulse 62, temperature 98.8 F (37.1 C), resp. rate 19, height 6' (1.829 m), weight 90.8 kg, SpO2 97 %.   General appearance: appears stated age Resp: clear to auscultation bilaterally Cardio: regular rate and rhythm, S1, S2 normal, no murmur, click, rub or gallop GI: soft, non-tender; bowel sounds normal; no masses,  no organomegaly Extremities: extremities normal, atraumatic, no cyanosis or edema Pulses: 2+ and symmetric Neurologic:  Alert and oriented X 3, normal strength and tone. Normal symmetric reflexes. Normal coordination and gait Labs:   Lab Results  Component Value Date   WBC 10.0 09/19/2021   HGB 13.7 09/19/2021   HCT 40.1 09/19/2021   MCV 92.4 09/19/2021   PLT 107 (L) 09/19/2021    Recent Labs  Lab 09/19/21 0609  NA 138  K 2.9*  CL 107  CO2 21*  BUN 19  CREATININE 0.82  CALCIUM 7.7*  GLUCOSE 110*      Radiology: Negative chest x-ray unremarkable EKG: Left bundle branch block sinus rhythm  FOLLOW UP PLANS AND APPOINTMENTS Discharge Instructions     AMB Referral to Cardiac Rehabilitation - Phase II   Complete by: As directed    Diagnosis:  Coronary Stents NSTEMI     After initial evaluation and assessments completed: Virtual Based Care may be provided alone or in conjunction with Phase 2 Cardiac Rehab based on patient barriers.: Yes      Allergies as of 09/19/2021   No Known Allergies      Medication List     STOP taking these medications    aspirin 325 MG tablet Replaced by: aspirin EC 81 MG tablet       TAKE these medications    amiodarone 200 MG tablet Commonly known as: PACERONE Take 200 mg by mouth daily.   aspirin EC 81 MG tablet Take 1 tablet (81 mg total) by mouth daily. Swallow whole. Replaces: aspirin 325 MG tablet   clopidogrel 75 MG tablet Commonly known as: PLAVIX Take 1 tablet (75 mg total) by mouth daily with breakfast. Start taking on: September 20, 2021   doxylamine (Sleep) 25 MG tablet Commonly known as: UNISOM Take 12.5 mg by mouth at  bedtime.   Eliquis 5 MG Tabs tablet Generic drug: apixaban Take 5 mg by mouth 2 (two) times daily.   escitalopram 20 MG tablet Commonly known as: LEXAPRO Take 20 mg by mouth at bedtime.   losartan-hydrochlorothiazide 100-25 MG tablet Commonly known as: HYZAAR Take 1 tablet by mouth daily.   multivitamin with minerals Tabs tablet Take 1 tablet by mouth daily. Centrum Silver for Men 50+   mupirocin ointment  2 % Commonly known as: BACTROBAN Place 1 Application into the nose 2 (two) times daily.   nitroGLYCERIN 0.4 MG SL tablet Commonly known as: NITROSTAT Place 1 tablet (0.4 mg total) under the tongue every 5 (five) minutes as needed for chest pain.   rosuvastatin 20 MG tablet Commonly known as: CRESTOR Take 20 mg by mouth at bedtime.         BRING ALL MEDICATIONS WITH YOU TO FOLLOW UP APPOINTMENTS  Time spent with patient to include physician time:  Signed:  Alwyn Pea MD 09/19/2021, 5:15 PM

## 2021-09-19 NOTE — Progress Notes (Signed)
PHARMACY CONSULT NOTE  Pharmacy Consult for Electrolyte Monitoring and Replacement   Recent Labs: Potassium (mmol/L)  Date Value  09/19/2021 2.9 (L)   Magnesium (mg/dL)  Date Value  95/28/4132 2.0   Calcium (mg/dL)  Date Value  44/04/270 7.7 (L)   Sodium (mmol/L)  Date Value  09/19/2021 138    Assessment: 66 year old male w/ PMH of atrial fibrillation, depression, HTN, HLD presented w/ chest pain / NSTEMI now s/p DES to LAD. Pharmacy is asked to follow and replace electrolytes while in CCU  Goal of Therapy:  Potassium 4.0 - 5.1 mmol/L Magnesium 2.0 - 2.4 mg/dL All Other Electrolytes WNL  Plan:  10 mEq IV KCl x 2 40 mEq oral KCl x 2 per MD Recheck electrolytes in am  Lowella Bandy ,PharmD Clinical Pharmacist 09/19/2021 7:17 AM

## 2021-09-19 NOTE — Progress Notes (Signed)
Discharged home with wife. Discharge teaching done. Questions answered.

## 2021-09-19 NOTE — Progress Notes (Addendum)
[facility] PROGRESS NOTE    Progress Note  Tommy Rowe  DJT:[id number] DOB: 1956/02/14 DOA: 09/17/2021 PCP: [facility]     Brief Narrative:   Tommy Rowe is an 66 y.o. male past medical history significant for essential hypertension, hyperlipidemia atrial fibrillation on Eliquis depression with anxiety and diplopia presents with chest pain intermittently worse with exertion this started 2 days prior to admission.  In the ED troponins were 1300, twelve-lead EKG showed T wave inversions in lead 1, V4, V5 and V6, with what appears to be elevation in V1 through V4 with poor R wave progression he was started on aspirin, Plavix continue statins and heparin cardiology was consulted. Assessment/Plan:   NSTEMI (non-ST elevated myocardial infarction) ([place]) Status post DES stent to the mid LAD, continue aspirin and Plavix for at least a year. Continue morphine statin and nitroglycerin as needed. Cardiology to dictate when to start Eliquis.  A-fib with RVR likely postprocedural: Had to be started on IV amiodarone he remained chest pain-free and shortness of breath free. Has converted back to sinus rhythm rate control. Cardiology to dictate when to start Eliquis, it is to note that he is on aspirin and Plavix.  Essential hypertension: Hold ARB blood pressure is relatively stable after controlling heart rate.  Hypokalemia: Replete orally recheck in the morning.  Hyperlipidemia: continue Crestor.  Depression with anxiety: Continue Lexapro.  Single episode of a temperature: Blood cultures remain negative till date has not spiked any other fevers. White count remained stable. Vanco and cefepime were discontinued. Fever could be likely due to NSTEMI   DVT prophylaxis: heparin Family Communication:none Status is: Inpatient Remains inpatient appropriate because: NSTEMI    Code Status:     Code Status Orders  (From admission, onward)            Start     Ordered   09/17/21 1200  Full code  Continuous        09/17/21 1200           Code Status History     Date Active Date Inactive Code Status Order ID Comments User Context   04/11/2021 1230 04/11/2021 1911 Full Code [id number]  Dionisio David, MD Inpatient   08/26/2019 1249 08/27/2019 1942 Full Code [id number]  Lonell Face, NP Inpatient         IV Access:   Peripheral IV   Procedures and diagnostic studies:   CARDIAC CATHETERIZATION  Result Date: 09/18/2021   Mid LAD-2 lesion is 100% stenosed.   Mid LAD-1 lesion is 70% stenosed.   2nd Diag lesion is 75% stenosed.   Dist RCA lesion is 50% stenosed.   A drug-eluting stent was successfully placed using a STENT ONYX FRONTIER 2.5X22.   Post intervention, there is a 0% residual stenosis.   There is moderate left ventricular systolic dysfunction.   LV end diastolic pressure is mildly elevated.   The left ventricular ejection fraction is 35-45% by visual estimate. Conclusion Moderately reduced left ventricular function with anterior apical akinesis EF around 35 to 40% Coronary Left main large relatively free of disease LAD large with a 75% proximal 100% mid TIMI 0 flow Diagonal 2 had a 80% proximal lesion TIMI-3 flow Circumflex large minor irregularities RCA with large minor irregularities with a 50% distal lesion TIMI-3 flow Intervention Successful PCI and stent of mid LAD with DES stent Onyx frontier 2.5 x 22 mm stent was deployed Postdilated with a [place] trek Neo at 14 atm  Lesion was reduced from 100% down to 0% and TIMI-3 flow was restored from TIMI 0 Minx was deployed in the right groin Patient tolerated procedure well No complications   DG Chest 2 View  Result Date: 09/17/2021 CLINICAL DATA:  66 year old male with history of chest pain. EXAM: CHEST - 2 VIEW COMPARISON:  No priors. FINDINGS: Lung volumes are normal. No consolidative airspace disease. No pleural effusions. No pneumothorax. No pulmonary nodule or mass noted.  Pulmonary vasculature and the cardiomediastinal silhouette are within normal limits. IMPRESSION: No radiographic evidence of acute cardiopulmonary disease. Electronically Signed   By: Vinnie Langton M.D.   On: 09/17/2021 09:49     Medical Consultants:   None.   Subjective:    Tommy Rowe chest pain and shortness of breath free. Objective:    Vitals:   09/19/21 0130 09/19/21 0145 09/19/21 0200 09/19/21 0630  BP: (!) 99/59 (!) 106/55 (!) 92/47 (!) 93/37  Pulse: 71 70 68 65  Resp: (!) 28 (!) 25 (!) 26 (!) 22  Temp:      TempSrc:      SpO2: 94% 97% 92% 92%  Weight:      Height:       SpO2: 92 % O2 Flow Rate (L/min): 2 L/min   Intake/Output Summary (Last 24 hours) at 09/19/2021 0834 Last data filed at 09/19/2021 0600 Gross per 24 hour  Intake 1926.36 ml  Output 590 ml  Net 1336.36 ml    Filed Weights   09/17/21 0901 09/18/21 0650  Weight: 93 kg 90.8 kg    Exam: General exam: In no acute distress. Respiratory system: Good air movement and clear to auscultation. Cardiovascular system: S1 & S2 heard, RRR. No JVD. Gastrointestinal system: Abdomen is nondistended, soft and nontender.  Extremities: No pedal edema. Skin: No rashes, lesions or ulcers Psychiatry: Judgement and insight appear normal. Mood & affect appropriate.   Data Reviewed:    Labs: Basic Metabolic Panel: Recent Labs  Lab 09/17/21 0907 09/18/21 0452 09/18/21 2225 09/19/21 0609  NA 137  --   --  138  K 3.9  --  3.0* 2.9*  CL 101  --   --  107  CO2 27  --   --  21*  GLUCOSE 163*  --   --  110*  BUN 24*  --   --  19  CREATININE 0.89 0.74  --  0.82  CALCIUM 9.1  --   --  7.7*  MG  --   --  2.0 1.9    GFR Estimated Creatinine Clearance: 99.9 mL/min (by C-G formula based on SCr of 0.82 mg/dL). Liver Function Tests: No results for input(s): "AST", "ALT", "ALKPHOS", "BILITOT", "PROT", "ALBUMIN" in the last 168 hours. No results for input(s): "LIPASE", "AMYLASE" in the last 168 hours. No  results for input(s): "AMMONIA" in the last 168 hours. Coagulation profile Recent Labs  Lab 09/17/21 1012  INR 1.3*    COVID-19 Labs  No results for input(s): "DDIMER", "FERRITIN", "LDH", "CRP" in the last 72 hours.  Lab Results  Component Value Date   [place] NEGATIVE 08/24/2019    CBC: Recent Labs  Lab 09/17/21 0907 09/18/21 0452 09/18/21 2225 09/19/21 0609  WBC 10.6* 10.0 10.0 10.0  HGB 16.3 13.9 14.4 13.7  HCT 48.5 40.9 42.5 40.1  MCV 92.9 91.7 91.8 92.4  PLT 167 111* 115* 107*    Cardiac Enzymes: No results for input(s): "CKTOTAL", "CKMB", "CKMBINDEX", "TROPONINI" in the last 168 hours. BNP (last 3  results) No results for input(s): "PROBNP" in the last 8760 hours. CBG: Recent Labs  Lab 09/18/21 0724  GLUCAP 107*   D-Dimer: No results for input(s): "DDIMER" in the last 72 hours. Hgb A1c: Recent Labs    09/17/21 0907  HGBA1C 5.7*    Lipid Profile: Recent Labs    09/17/21 0907  CHOL 109  HDL 49  LDLCALC 55  TRIG 23  CHOLHDL 2.2    Thyroid function studies: No results for input(s): "TSH", "T4TOTAL", "T3FREE", "THYROIDAB" in the last 72 hours.  Invalid input(s): "FREET3" Anemia work up: No results for input(s): "VITAMINB12", "FOLATE", "FERRITIN", "TIBC", "IRON", "RETICCTPCT" in the last 72 hours. Sepsis Labs: Recent Labs  Lab 09/17/21 0907 09/18/21 0452 09/18/21 2225 09/19/21 0609  WBC 10.6* 10.0 10.0 10.0    Microbiology Recent Results (from the past 240 hour(s))  MRSA Next Gen by PCR, Nasal     Status: Abnormal   Collection Time: 09/17/21  6:22 PM   Specimen: Nasal Mucosa; Nasal Swab  Result Value Ref Range Status   MRSA by PCR Next Gen DETECTED (A) NOT DETECTED Final    Comment: CRITICAL RESULT CALLED TO, READ BACK BY AND VERIFIED WITH: RN CRYSTAL [facility] AT 1940 09/17/2021 GAA (NOTE) The GeneXpert MRSA Assay (FDA approved for NASAL specimens only), is one component of a comprehensive MRSA colonization  surveillance program. It is not intended to diagnose MRSA infection nor to guide or monitor treatment for MRSA infections. Test performance is not FDA approved in patients less than 53 years old. Performed at [facility], [address] [place], [place] [address]   Culture, blood (Routine X 2) w Reflex to ID Panel     Status: None (Preliminary result)   Collection Time: 09/17/21  7:23 PM   Specimen: BLOOD  Result Value Ref Range Status   Specimen Description BLOOD BLOOD RIGHT HAND  Final   Special Requests   Final    BOTTLES DRAWN AEROBIC AND ANAEROBIC Blood Culture adequate volume   Culture   Final    NO GROWTH 2 DAYS Performed at [facility], [address] [place], [place] [address]    Report Status PENDING  Incomplete  Culture, blood (Routine X 2) w Reflex to ID Panel     Status: None (Preliminary result)   Collection Time: 09/17/21  7:27 PM   Specimen: BLOOD  Result Value Ref Range Status   Specimen Description BLOOD BLOOD LEFT HAND  Final   Special Requests   Final    BOTTLES DRAWN AEROBIC AND ANAEROBIC Blood Culture adequate volume   Culture   Final    NO GROWTH 2 DAYS Performed at [facility], [address], [place], [place] [address]    Report Status PENDING  Incomplete     Medications:    aspirin  81 mg Oral Daily   Chlorhexidine Gluconate Cloth  6 each Topical Q0600   clopidogrel  75 mg Oral Q breakfast   doxylamine (Sleep)  12.5 mg Oral QHS   escitalopram  20 mg Oral QHS   losartan  100 mg Oral Daily   And   hydrochlorothiazide  25 mg Oral Daily   multivitamin with minerals  1 tablet Oral Daily   mupirocin ointment  1 Application Nasal BID   rosuvastatin  20 mg Oral QHS   sodium chloride flush  3 mL Intravenous Q12H   sodium chloride flush  3 mL Intravenous Q12H   Continuous Infusions:  sodium chloride 75 mL/hr  at 09/17/21 1833   sodium chloride     amiodarone 30 mg/hr (09/19/21 0600)   potassium chloride         LOS: 2 days   Charlynne Cousins  [facility]  09/19/2021, 8:34 AM

## 2021-09-19 NOTE — Progress Notes (Signed)
*  PRELIMINARY RESULTS* Echocardiogram 2D Echocardiogram has been performed.  Cristela Blue 09/19/2021, 9:30 AM

## 2021-09-20 LAB — LIPOPROTEIN A (LPA): Lipoprotein (a): 114.4 nmol/L — ABNORMAL HIGH (ref <75.0)

## 2021-09-20 LAB — GLUCOSE, CAPILLARY: Glucose-Capillary: 100 mg/dL — ABNORMAL HIGH (ref 70–99)

## 2021-09-22 LAB — CULTURE, BLOOD (ROUTINE X 2)
Culture: NO GROWTH
Culture: NO GROWTH
Special Requests: ADEQUATE
Special Requests: ADEQUATE

## 2022-02-14 ENCOUNTER — Encounter: Payer: PRIVATE HEALTH INSURANCE | Attending: Internal Medicine | Admitting: *Deleted

## 2022-02-14 DIAGNOSIS — Z955 Presence of coronary angioplasty implant and graft: Secondary | ICD-10-CM

## 2022-02-14 DIAGNOSIS — I214 Non-ST elevation (NSTEMI) myocardial infarction: Secondary | ICD-10-CM

## 2022-02-14 NOTE — Progress Notes (Signed)
Virtual orientation call completed today. he has an appointment on Date: 02/26/2022  for EP eval and gym Orientation.  Documentation of diagnosis can be found in [facility] Date: 09/17/2021 .

## 2022-02-25 ENCOUNTER — Other Ambulatory Visit: Payer: Self-pay

## 2022-02-25 ENCOUNTER — Emergency Department
Admission: EM | Admit: 2022-02-25 | Discharge: 2022-02-25 | Disposition: A | Discharge status: Home / Self Care | Payer: Managed Care, Other (non HMO) | Attending: Emergency Medicine | Admitting: Emergency Medicine

## 2022-02-25 ENCOUNTER — Emergency Department
Admission: EM | Admit: 2022-02-25 | Discharge: 2022-02-25 | Disposition: A | Discharge status: Home / Self Care | Payer: Managed Care, Other (non HMO) | Source: Home / Self Care | Attending: Emergency Medicine | Admitting: Emergency Medicine

## 2022-02-25 DIAGNOSIS — I10 Essential (primary) hypertension: Secondary | ICD-10-CM | POA: Insufficient documentation

## 2022-02-25 DIAGNOSIS — T839XXA Unspecified complication of genitourinary prosthetic device, implant and graft, initial encounter: Secondary | ICD-10-CM

## 2022-02-25 DIAGNOSIS — T83098A Other mechanical complication of other indwelling urethral catheter, initial encounter: Secondary | ICD-10-CM | POA: Insufficient documentation

## 2022-02-25 DIAGNOSIS — R319 Hematuria, unspecified: Secondary | ICD-10-CM | POA: Insufficient documentation

## 2022-02-25 DIAGNOSIS — Z7901 Long term (current) use of anticoagulants: Secondary | ICD-10-CM | POA: Insufficient documentation

## 2022-02-25 DIAGNOSIS — Y738 Miscellaneous gastroenterology and urology devices associated with adverse incidents, not elsewhere classified: Secondary | ICD-10-CM | POA: Diagnosis not present

## 2022-02-25 DIAGNOSIS — I2510 Atherosclerotic heart disease of native coronary artery without angina pectoris: Secondary | ICD-10-CM | POA: Diagnosis not present

## 2022-02-25 DIAGNOSIS — R339 Retention of urine, unspecified: Secondary | ICD-10-CM | POA: Insufficient documentation

## 2022-02-25 LAB — CK: Total CK: 324 U/L (ref 49–397)

## 2022-02-25 LAB — BASIC METABOLIC PANEL
Anion gap: 6 (ref 5–15)
BUN: 33 mg/dL — ABNORMAL HIGH (ref 8–23)
CO2: 28 mmol/L (ref 22–32)
Calcium: 8.9 mg/dL (ref 8.9–10.3)
Chloride: 104 mmol/L (ref 98–111)
Creatinine, Ser: 0.89 mg/dL (ref 0.61–1.24)
GFR, Estimated: >60 mL/min (ref >60)
Glucose, Bld: 175 mg/dL — ABNORMAL HIGH (ref 70–99)
Potassium: 3.5 mmol/L (ref 3.5–5.1)
Sodium: 138 mmol/L (ref 135–145)

## 2022-02-25 LAB — CBC WITH DIFFERENTIAL/PLATELET
Abs Immature Granulocytes: 0.02 10*3/uL (ref 0.00–0.07)
Basophils Absolute: 0.1 10*3/uL (ref 0.0–0.1)
Basophils Relative: 1 %
Eosinophils Absolute: 0 10*3/uL (ref 0.0–0.5)
Eosinophils Relative: 0 %
HCT: 45 % (ref 39.0–52.0)
Hemoglobin: 15.2 g/dL (ref 13.0–17.0)
Immature Granulocytes: 0 %
Lymphocytes Relative: 17 %
Lymphs Abs: 1.2 10*3/uL (ref 0.7–4.0)
MCH: 31.2 pg (ref 26.0–34.0)
MCHC: 33.8 g/dL (ref 30.0–36.0)
MCV: 92.4 fL (ref 80.0–100.0)
Monocytes Absolute: 0.5 10*3/uL (ref 0.1–1.0)
Monocytes Relative: 7 %
Neutro Abs: 5.3 10*3/uL (ref 1.7–7.7)
Neutrophils Relative %: 75 %
Platelets: 165 10*3/uL (ref 150–400)
RBC: 4.87 MIL/uL (ref 4.22–5.81)
RDW: 12.5 % (ref 11.5–15.5)
WBC: 7.1 10*3/uL (ref 4.0–10.5)
nRBC: 0 % (ref 0.0–0.2)

## 2022-02-25 LAB — URINALYSIS, ROUTINE W REFLEX MICROSCOPIC
RBC / HPF: >50 RBC/hpf — ABNORMAL HIGH (ref 0–5)
Specific Gravity, Urine: 1.025 (ref 1.005–1.030)
Squamous Epithelial / HPF: NONE SEEN (ref 0–5)
WBC, UA: >50 WBC/hpf — ABNORMAL HIGH (ref 0–5)

## 2022-02-25 MED ORDER — SULFAMETHOXAZOLE-TRIMETHOPRIM 800-160 MG PO TABS: 1.0000 | ORAL_TABLET | Freq: Two times a day (BID) | ORAL | 0 refills | Status: AC

## 2022-02-25 MED ORDER — SULFAMETHOXAZOLE-TRIMETHOPRIM 800-160 MG PO TABS
1.0000 | ORAL_TABLET | Freq: Once | ORAL | Status: AC
  Administered 2022-02-25: 1 via ORAL
  Filled 2022-02-25: qty 1

## 2022-02-25 MED ORDER — LIDOCAINE HCL URETHRAL/MUCOSAL 2 % EX GEL
1.0000 | Freq: Once | CUTANEOUS | Status: AC
  Administered 2022-02-25: 1 via URETHRAL
  Filled 2022-02-25: qty 6

## 2022-02-25 NOTE — Discharge Instructions (Signed)
Call [facility] for urology follow up.  Take antibiotics.   Thank you for choosing [place] for your health care today!  Please see your primary doctor this week for a follow up appointment.   If you do not have a primary doctor call the following clinics to establish care:  If you have insurance:  [facility] [phone] [address] [place] [place] [address]   [facility]  [phone] [address] [place] [place] [address]   If you do not have insurance:  [facility]  [phone] [address] [place] [address]  Sometimes, in the early stages of certain disease courses it is difficult to detect in the emergency department evaluation -- so, it is important that you continue to monitor your symptoms and call your doctor right away or return to the emergency department if you develop any new or worsening symptoms.  It was my pleasure to care for you today.   Daneil Dan Modesto Charon, MD

## 2022-02-25 NOTE — ED Notes (Signed)
No further episodes of clotting in catheter or extreme lower abdominal pressure.  Foley bag exchanged for leg bag. Provided patient and family with teaching on how to care for urinary catheter and the importance of follow up and return precautions.   Urine remains bloody at discharge. MD aware and does not want this RN to exchange foley for larger size at this time.    VSS at discharge. Pt ambulates to lobby with strong steady gait.   Verbalizes understanding of all discharge instructions - signature pad out of order.

## 2022-02-25 NOTE — ED Triage Notes (Signed)
Pt states he was here earlier for urinary retention and peeing blood- pt had a foley placed- pt states that they irrigated the catheter and it was draining well- pt states that he thinks it is clogged as he is bleeding around the catheter when he gets the sensation like he has to pee

## 2022-02-25 NOTE — ED Notes (Signed)
Pt reporting pressure to lower abdomen, MD verbal order to irrigate foley catheter- irrigated foley catheter with 20cc NS, small clot pulled out. Foley catheter draining normally after irrigation. Lower abdominal pressure improves after irrigation.  Notified Dr. Modesto Charon.

## 2022-02-25 NOTE — ED Provider Triage Note (Signed)
  Emergency Medicine Provider Triage Evaluation Note  Tommy Rowe , a 66 y.o.male,  was evaluated in triage.  Pt complains of urinary retention.  Patient states that he was seen here earlier this morning for difficulty voiding.  His bladder scan showed 600 cc in the bladder.  A Foley catheter was placed which resulted in immediate relief of his symptoms.  However, when he returned home, he noticed increased bleeding around the catheter and states he still feels like he has some urinary retention.   Review of Systems  Positive: Urinary retention, hematuria Negative: Denies fever, chest pain, vomiting  Physical Exam   Vitals:   02/25/22 1306  BP: (!) 146/93  Pulse: (!) 50  Resp: 18  Temp: 98.3 F (36.8 C)  SpO2: 96%   Gen:   Awake, appears uncomfortable. Resp:  Normal effort  MSK:   Moves extremities without difficulty  Other:    Medical Decision Making  Given the patient's initial medical screening exam, the following diagnostic evaluation has been ordered. The patient will be placed in the appropriate treatment space, once one is available, to complete the evaluation and treatment. I have discussed the plan of care with the patient and I have advised the patient that an ED physician or mid-level practitioner will reevaluate their condition after the test results have been received, as the results may give them additional insight into the type of treatment they may need.    Diagnostics: None immediately.  Treatments: none immediately   Varney Daily, [place] 02/25/22 1314

## 2022-02-25 NOTE — ED Provider Notes (Signed)
[facility] Provider Note  Patient Contact: 4:06 PM (approximate)   History   Hematuria   HPI  Tommy Rowe is a 66 y.o. male with a history of anxiety, A-fib, hyperlipidemia and hypertension presents to the emergency department with concern for a clogged Foley catheter.  Patient had his Foley irrigated this morning and he states that it was draining well until he returned home.  Patient also states that he has had several droplets of blood appear between Foley in urethra.  Patient is on Eliquis and Plavix.      Physical Exam   Triage Vital Signs: ED Triage Vitals  Enc Vitals Group     BP 02/25/22 1306 (!) 146/93     Pulse Rate 02/25/22 1306 (!) 50     Resp 02/25/22 1306 18     Temp 02/25/22 1306 98.3 F (36.8 C)     Temp Source 02/25/22 1306 Oral     SpO2 02/25/22 1306 96 %     Weight --      Height --      Head Circumference --      Peak Flow --      Pain Score 02/25/22 1307 0     Pain Loc --      Pain Edu? --      Excl. in GC? --     Most recent vital signs: Vitals:   02/25/22 1306  BP: (!) 146/93  Pulse: (!) 50  Resp: 18  Temp: 98.3 F (36.8 C)  SpO2: 96%     General: Alert and in no acute distress. Eyes:  PERRL. EOMI. Head: No acute traumatic findings ENT:      Nose: No congestion/rhinnorhea.      Mouth/Throat: Mucous membranes are moist. Neck: No stridor. No cervical spine tenderness to palpation. Cardiovascular:  Good peripheral perfusion Respiratory: Normal respiratory effort without tachypnea or retractions. Lungs CTAB. Good air entry to the bases with no decreased or absent breath sounds. Gastrointestinal: Bowel sounds 4 quadrants. Soft and nontender to palpation. No guarding or rigidity. No palpable masses. No distention. No CVA tenderness. Musculoskeletal: Full range of motion to all extremities.  Neurologic:  No gross focal neurologic deficits are appreciated.  Skin:   No rash noted    ED Results / Procedures  / Treatments   Labs (all labs ordered are listed, but only abnormal results are displayed) Labs Reviewed - No data to display      PROCEDURES:  Critical Care performed: No  Procedures   MEDICATIONS ORDERED IN ED: Medications  lidocaine (XYLOCAINE) 2 % jelly 1 Application (1 Application Urethral Given 02/25/22 1742)     IMPRESSION / MDM / ASSESSMENT AND PLAN / ED COURSE  I reviewed the triage vital signs and the nursing notes.                              Assessment and plan Foley catheter problem. 66 year old male presents to the emergency department after his Foley catheter seemed clogged after he returned home.  Patient was bradycardic at triage but vital signs were otherwise reassuring.  On exam, patient was alert and nontoxic-appearing.  Will irrigate Foley catheter and will reassess.   Patient's Foley catheter remained clogged after irrigation.  Patient's Foley was changed from a 76 [place] to a 11 [place] and a three-way was placed.  Patient had 1200 cc of fluid in his leg bag and he  felt much improved.  Recommended continuing antibiotics as directed by Dr. Renaldo Reel and return precautions were given to return with new or worsening symptoms.   FINAL CLINICAL IMPRESSION(S) / ED DIAGNOSES   Final diagnoses:  Problem with Foley catheter, initial encounter [facility])     Rx / DC Orders   ED Discharge Orders     None        Note:  This document was prepared using Dragon voice recognition software and may include unintentional dictation errors.   Pia Mau Lowell, PA-C 02/25/22 1831    Sharman Cheek, MD 02/25/22 1934

## 2022-02-25 NOTE — ED Notes (Signed)
This RN assisted with [name] in inserting new foley catheter as per provider. Approximately of red fluid came out of the bladder with new catheter being placed. Provider notified.

## 2022-02-25 NOTE — ED Triage Notes (Signed)
Pt reports hematuria beginning last night that started heavy and has decreased since. Pt reports having increased frequency to urinate last night and this morning and that he has difficulty starting stream. Pt reports mild tenderness in bladder area. Increased urinary urgency. Pt denies fever, N/V. Pt reports same symptoms happening several months ago immediately after physically overexerting himself and that symptoms went away on there own. Pt states he did work a [employer].

## 2022-02-25 NOTE — ED Notes (Signed)
Pts foley irrigated. Pt showed good output post irrigation.

## 2022-02-25 NOTE — ED Triage Notes (Signed)
Pt on plavix, aspirin, eloquis.

## 2022-02-25 NOTE — ED Notes (Addendum)
Bladder scan showed ; foley placed pt actively draining. Currently ~1064mL output; dark red in color & cloudy. Pt endorses immediate relief in the bladder discomfort.

## 2022-02-25 NOTE — ED Provider Notes (Addendum)
[facility] Provider Note    Event Date/Time   First MD Initiated Contact with Patient 02/25/22 0703     (approximate)   History   Hematuria   HPI  Tommy Rowe is a 66 y.o. male   Past medical history of CAD, A-fib on Eliquis, hypertension hyperlipidemia who presents to the emergency department with hematuria this morning.  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  Has had no similar occurrences in the past.  Had no preceding pain or dysuria.    Upon arrival in the emergency department he had difficulty voiding and had a bladder scan with 600 cc in the bladder and a Foley catheter was placed with red urine output and immediately relief of symptoms.  Patient is now comfortable with no complaints.   History was obtained via the patient. I also reviewed an external medical note discharge summary dated 09/19/2021 when he was hospitalized for unstable angina and noted then to be on blood thinners Eliquis and Plavix and aspirin.     Physical Exam   Triage Vital Signs: ED Triage Vitals  Enc Vitals Group     BP 02/25/22 0557 (!) 155/89     Pulse Rate 02/25/22 0555 83     Resp 02/25/22 0555 17     Temp 02/25/22 0555 98.7 F (37.1 C)     Temp Source 02/25/22 0555 Oral     SpO2 02/25/22 0555 97 %     Weight 02/25/22 0556 205 lb (93 kg)     Height 02/25/22 0556 6' (1.829 m)     Head Circumference --      Peak Flow --      Pain Score 02/25/22 0556 2     Pain Loc --      Pain Edu? --      Excl. in GC? --     Most recent vital signs: Vitals:   02/25/22 0555 02/25/22 0557  BP:  (!) 155/89  Pulse: 83   Resp: 17   Temp: 98.7 F (37.1 C)   SpO2: 97%     General: Awake, no distress. CV:  Good peripheral perfusion.  Resp:  Normal effort.  Abd:  No distention.  Nontender  abdomen. Other:  Comfortable appearing, normal hemodynamics, no tachycardia, slightly hypertensive, red urine coming from Foley, abdomen is soft and nontender.   ED Results / Procedures / Treatments   Labs (all labs ordered are listed, but only abnormal results are displayed) Labs Reviewed  BASIC METABOLIC PANEL - Abnormal; Notable for the following components:      Result Value   Glucose, Bld 175 (*)    BUN 33 (*)    All other components within normal limits  URINALYSIS, ROUTINE W REFLEX MICROSCOPIC - Abnormal; Notable for the following components:   Color, Urine RED (*)    APPearance CLOUDY (*)    Glucose, UA   (*)    Value: TEST NOT REPORTED DUE TO COLOR INTERFERENCE OF URINE PIGMENT   Hgb urine dipstick   (*)    Value: TEST NOT REPORTED DUE TO COLOR INTERFERENCE OF URINE PIGMENT   Bilirubin Urine   (*)    Value: TEST NOT REPORTED DUE TO COLOR INTERFERENCE OF URINE PIGMENT   Ketones, ur   (*)    Value: TEST NOT REPORTED  DUE TO COLOR INTERFERENCE OF URINE PIGMENT   Protein, ur   (*)    Value: TEST NOT REPORTED DUE TO COLOR INTERFERENCE OF URINE PIGMENT   Nitrite   (*)    Value: TEST NOT REPORTED DUE TO COLOR INTERFERENCE OF URINE PIGMENT   Leukocytes,Ua   (*)    Value: TEST NOT REPORTED DUE TO COLOR INTERFERENCE OF URINE PIGMENT   RBC / HPF >50 (*)    WBC, UA >50 (*)    Bacteria, UA MANY (*)    All other components within normal limits  URINE CULTURE  CBC WITH DIFFERENTIAL/PLATELET  CK     I reviewed labs and they are notable for bacteria in the urine.  We will H&H.     PROCEDURES:  Critical Care performed: No  Procedures   MEDICATIONS ORDERED IN ED: Medications  sulfamethoxazole-trimethoprim (BACTRIM DS) 800-160 MG per tablet 1 tablet (has no administration in time range)      IMPRESSION / MDM / ASSESSMENT AND PLAN / ED COURSE  I reviewed the triage vital signs and the nursing notes.                              Differential diagnosis includes, but  is not limited to, the myelitis, urinary tract infection, hematuria, bladder inflammation, malignancy, nephrolithiasis, uropathy, renal failure, blood loss anemia    MDM: Patient with red urine in the setting of extensive work performed yesterday with no traumatic injuries.  Check CK for rhabdo.  Check UA for urine infection.  Check basic labs for renal function electrolyte derangements and anemia.  Fortunately the patient had immediate relief of abdominal discomfort after Foley catheter placed with over 600 cc output.  Nurse irrigated several times. Foley will remain in place with urology follow-up this week.  His urinalysis showed bacteria, will treat for urine infection.  His CK was in the 300s and renal function within normal limits. He has no symptoms of blood loss and his H&H is normal with normal hemodynamics, doubt significant blood loss.  At this time, no reason to discontinue his blood thinners which are important for his cardiac issues.  He will be discharged on antibiotics and urology follow-up this week for voiding trial and further eval for hematuria.  Return precautions given for symptoms of blood loss, obstruction of Foley catheter.   Patient's presentation is most consistent with acute presentation with potential threat to life or bodily function.       FINAL CLINICAL IMPRESSION(S) / ED DIAGNOSES   Final diagnoses:  Hematuria, unspecified type  Urinary retention     Rx / DC Orders   ED Discharge Orders          Ordered    sulfamethoxazole-trimethoprim (BACTRIM DS) 800-160 MG tablet  2 times daily        02/25/22 0732             Note:  This document was prepared using Dragon voice recognition software and may include unintentional dictation errors.    Pilar Jarvis, MD 02/25/22 9242    Pilar Jarvis, MD 02/25/22 [phone]

## 2022-02-25 NOTE — ED Notes (Addendum)
Pt states having a foley catheter placed earlier today and having blood clots and that it clotted off. Pt states it feels like he needs to urinate. This RN attempted to irrigate the foley as follows: 44ml of NS given and no return noted. Catheter bag noted to have blood clots in it, so new catheter bag placed, due to concern that there may be a clot at the entrance of the bag. New bag placed and no output noted. RN irrigated with another 21mL of NS with no output noted. Provider notified who asked for a bladder scan. ED bladder scan currently not working. RN talked with provider who stated we can try increasing the size of the foley catheter and putting a 3 day catheter in.   Report given to [place], [place]

## 2022-02-26 ENCOUNTER — Ambulatory Visit: Payer: Medicare Other

## 2022-02-26 LAB — URINE CULTURE: Culture: NO GROWTH

## 2022-03-07 ENCOUNTER — Encounter: Payer: Self-pay | Admitting: Urology

## 2022-03-07 ENCOUNTER — Ambulatory Visit (INDEPENDENT_AMBULATORY_CARE_PROVIDER_SITE_OTHER): Payer: Medicare Other | Admitting: Urology

## 2022-03-07 VITALS — BP 115/72 | HR 65 | Ht 72.0 in | Wt 208.0 lb

## 2022-03-07 DIAGNOSIS — R310 Gross hematuria: Secondary | ICD-10-CM

## 2022-03-07 DIAGNOSIS — Z466 Encounter for fitting and adjustment of urinary device: Secondary | ICD-10-CM | POA: Diagnosis not present

## 2022-03-07 LAB — BLADDER SCAN AMB NON-IMAGING

## 2022-03-07 MED ORDER — CEPHALEXIN 250 MG PO CAPS
500.0000 mg | ORAL_CAPSULE | Freq: Once | ORAL | Status: AC
  Administered 2022-03-07: 500 mg via ORAL

## 2022-03-07 NOTE — Progress Notes (Signed)
Tommy Rowe returns this afternoon for follow-up PVR.  He states he has been pushing fluids and has urinated with a good strong stream about 5 times.  His PVR is 220 mL.  He has no abdominal discomfort and he feels he is emptying his bladder completely.  He also has not noted any further gross hematuria and states his urine is clear yellow.  He will return on December 18 to review his CT urogram and undergo cystoscopy with Dr. Richardo Hanks.

## 2022-03-07 NOTE — Progress Notes (Signed)
03/07/22 10:06 AM   Dagmar Hait 06-Aug-1955 [id number]  CC: Gross hematuria, urinary retention  HPI: 66 year old male with history of MI and cardiac stent placement with Dr. Juliann Pares in June 2023 on anticoagulation with Plavix, Eliquis and aspirin who presented to the ER on 02/25/2022 with gross hematuria and urinary retention.  Originally a 1 [place] Foley was placed but not surprisingly this clotted off and he returned later in the day where a 20 [place] three-way was placed and irrigated.  He has been doing well since that time.  He thinks he did a fair amount of heavy lifting and work the day that he developed the gross hematuria.  He has 1 episode of gross hematuria about a month prior that resolved spontaneously.  Denies any urinary symptoms prior to developing the gross hematuria.  He saw his cardiologist and I reviewed those notes, and he recently stopped the Plavix and aspirin and is just taking the Eliquis twice daily.  Urine currently is clear yellow.  Urinalysis at time of admission showed greater than 50 RBCs, greater than 50 WBCs, and many bacteria, but culture showed no growth.  He was treated with 5 days of antibiotics.  PMH: Past Medical History:  Diagnosis Date   A-fib (HCC)    Anxiety    HLD (hyperlipidemia)    HTN (hypertension)     Surgical History: Past Surgical History:  Procedure Laterality Date   CORONARY STENT INTERVENTION N/A 09/18/2021   Procedure: CORONARY STENT INTERVENTION;  Surgeon: Alwyn Pea, MD;  Location: [facility];  Service: Cardiovascular;  Laterality: N/A;   FACIAL FRACTURE SURGERY  1980   LEFT HEART CATH AND CORONARY ANGIOGRAPHY Left 04/11/2021   Procedure: LEFT HEART CATH AND CORONARY ANGIOGRAPHY;  Surgeon: Laurier Nancy, MD;  Location: [facility] CV [facility];  Service: Cardiovascular;  Laterality: Left;   LEFT HEART CATH AND CORONARY ANGIOGRAPHY N/A 09/18/2021   Procedure: LEFT HEART CATH AND CORONARY ANGIOGRAPHY;  Surgeon:  Alwyn Pea, MD;  Location: [facility] CV [facility];  Service: Cardiovascular;  Laterality: N/A;   LUMBAR LAMINECTOMY/DECOMPRESSION MICRODISCECTOMY N/A 08/26/2019   Procedure: RIGHT L4-5 MICRODISCECTOMY;  Surgeon: Venetia Night, MD;  Location: [facility] ORS;  Service: Neurosurgery;  Laterality: N/A;    Family History: Family History  Problem Relation Age of Onset   COPD Mother    Heart attack Father    Valvular heart disease Sister     Social History:  reports that he quit smoking about 33 years ago. His smoking use included cigarettes. He has been exposed to tobacco smoke. He has never used smokeless tobacco. He reports that he does not drink alcohol and does not use drugs.  Physical Exam: BP 115/72   Pulse 65   Ht 6' (1.829 m)   Wt 208 lb (94.3 kg)   BMI 28.21 kg/m    Constitutional:  Alert and oriented, No acute distress. Cardiovascular: No clubbing, cyanosis, or edema. Respiratory: Normal respiratory effort, no increased work of breathing. GI: Abdomen is soft, nontender, nondistended, no abdominal masses GU: 20 French three-way Foley with clear yellow urine  Laboratory Data: Reviewed, see HPI, no prior PSA to review  Pertinent Imaging: No imaging performed  Assessment & Plan:   66 year old male with cardiac stent placement in June 2023 on anticoagulation with Eliquis, Plavix, and aspirin who developed gross hematuria after strenuous day at work with clot retention and Foley catheter was placed.  I reviewed his cardiology notes and they stopped his Plavix  and aspirin, and he is currently just on Eliquis, and hematuria has resolved and urine is clear yellow in his catheter today.  We discussed common possible etiologies of hematuria including BPH, malignancy, urolithiasis, medical renal disease, and idiopathic. Standard workup recommended by the AUA includes imaging with CT urogram to assess the upper tracts, and cystoscopy. Cytology is performed on patient's with gross  hematuria to look for malignant cells in the urine.  Foley was removed today, Keflex given for prophylaxis, he returned this afternoon and has been able to void normally, and PVR was minimal.  Return precautions discussed.  CT urogram for further evaluation of gross hematuria, follow-up for cystoscopy after CT completed   Legrand Rams, MD 03/07/2022  [facility] [address], [address] [place], [place] [address] [phone]

## 2022-03-07 NOTE — Progress Notes (Signed)
Patient ID: Tommy Rowe, male   DOB: [birth year], 66 y.o.   MRN: [id number] Catheter Removal  Patient is present today for a catheter removal.  48ml of water was drained from the balloon. A 20FR foley cath was removed from the bladder, no complications were noted. Patient tolerated well.  Performed by: Mervin Hack, CMA  Follow up/ Additional notes: this afternoon 03/07/22

## 2022-03-07 NOTE — Patient Instructions (Signed)

## 2022-03-08 ENCOUNTER — Ambulatory Visit: Payer: Self-pay | Admitting: Urology

## 2022-03-12 ENCOUNTER — Encounter: Payer: Medicare Other | Attending: Internal Medicine | Admitting: *Deleted

## 2022-03-12 VITALS — Ht 72.5 in | Wt 207.8 lb

## 2022-03-12 DIAGNOSIS — I252 Old myocardial infarction: Secondary | ICD-10-CM | POA: Insufficient documentation

## 2022-03-12 DIAGNOSIS — I214 Non-ST elevation (NSTEMI) myocardial infarction: Secondary | ICD-10-CM | POA: Diagnosis present

## 2022-03-12 DIAGNOSIS — Z955 Presence of coronary angioplasty implant and graft: Secondary | ICD-10-CM | POA: Diagnosis present

## 2022-03-12 DIAGNOSIS — Z48812 Encounter for surgical aftercare following surgery on the circulatory system: Secondary | ICD-10-CM | POA: Diagnosis not present

## 2022-03-12 NOTE — Progress Notes (Signed)
Cardiac Individual Treatment Plan  Patient Details  Name: Tommy Rowe MRN: [id number] Date of Birth: 07-Jul-1955 Referring Provider:   Flowsheet Row Cardiac Rehab from 03/12/2022 in [facility] Cardiac and Pulmonary Rehab  Referring Provider Dorothyann Peng MD       Initial Encounter Date:  Flowsheet Row Cardiac Rehab from 03/12/2022 in [facility] Cardiac and Pulmonary Rehab  Date 03/12/22       Visit Diagnosis: NSTEMI (non-ST elevation myocardial infarction) [facility])  Status post coronary artery stent placement  Patient's Home Medications on Admission:  Current Outpatient Medications:    amiodarone (PACERONE) 100 MG tablet, Take 100 mg by mouth daily., Disp: , Rfl:    ELIQUIS 5 MG TABS tablet, Take 5 mg by mouth 2 (two) times daily., Disp: , Rfl:    escitalopram (LEXAPRO) 20 MG tablet, Take 20 mg by mouth at bedtime., Disp: , Rfl: 1   losartan-hydrochlorothiazide (HYZAAR) 100-25 MG tablet, Take 1 tablet by mouth daily., Disp: , Rfl:    Multiple Vitamin (MULTIVITAMIN WITH MINERALS) TABS tablet, Take 1 tablet by mouth daily. Centrum Silver for Men 50+, Disp: , Rfl:    nitroGLYCERIN (NITROSTAT) 0.4 MG SL tablet, Place 1 tablet (0.4 mg total) under the tongue every 5 (five) minutes as needed for chest pain., Disp: 1 tablet, Rfl: 12   rosuvastatin (CRESTOR) 20 MG tablet, Take 20 mg by mouth at bedtime., Disp: , Rfl:    triamcinolone cream (KENALOG) 0.1 %, APPLY THIN LAYER TOPICALLY TO THE AFFECTED AREA TWICE DAILY, Disp: , Rfl:  No current facility-administered medications for this visit.  Facility-Administered Medications Ordered in Other Visits:    sodium chloride flush (NS) 0.9 % injection 3 mL, 3 mL, Intravenous, Q12H, Scoggins, Amber, NP  Past Medical History: Past Medical History:  Diagnosis Date   A-fib (HCC)    Anxiety    HLD (hyperlipidemia)    HTN (hypertension)     Tobacco Use: Social History   Tobacco Use  Smoking Status Former   Types: Cigarettes   Quit date:  04/02/1988   Years since quitting: 33.9   Passive exposure: Past  Smokeless Tobacco Never    Labs: Review Flowsheet       Latest Ref Rng & Units 09/17/2021  Labs for ITP Cardiac and Pulmonary Rehab  Cholestrol 0 - 200 mg/dL 710   LDL (calc) 0 - 99 mg/dL 55   HDL-C >62 mg/dL 49   Trlycerides <694 mg/dL 23   Hemoglobin W5I 4.8 - 5.6 % 5.7      Exercise Target Goals: Exercise Program Goal: Individual exercise prescription set using results from initial 6 min walk test and THRR while considering  patient's activity barriers and safety.   Exercise Prescription Goal: Initial exercise prescription builds to 30-45 minutes a day of aerobic activity, 2-3 days per week.  Home exercise guidelines will be given to patient during program as part of exercise prescription that the participant will acknowledge.   Education: Aerobic Exercise: - Group verbal and visual presentation on the components of exercise prescription. Introduces F.I.T.T principle from ACSM for exercise prescriptions.  Reviews F.I.T.T. principles of aerobic exercise including progression. Written material given at graduation. Flowsheet Row Cardiac Rehab from 03/12/2022 in [facility] Cardiac and Pulmonary Rehab  Education need identified 03/12/22       Education: Resistance Exercise: - Group verbal and visual presentation on the components of exercise prescription. Introduces F.I.T.T principle from ACSM for exercise prescriptions  Reviews F.I.T.T. principles of resistance exercise including progression. Written material given  at graduation.    Education: Exercise & Equipment Safety: - Individual verbal instruction and demonstration of equipment use and safety with use of the equipment. Flowsheet Row Cardiac Rehab from 03/12/2022 in [facility] Cardiac and Pulmonary Rehab  Date 03/12/22  Educator [facility]  Instruction Review Code 1- Verbalizes Understanding       Education: Exercise Physiology & General Exercise Guidelines: - Group  verbal and written instruction with models to review the exercise physiology of the cardiovascular system and associated critical values. Provides general exercise guidelines with specific guidelines to those with heart or lung disease.  Flowsheet Row Cardiac Rehab from 03/12/2022 in [facility] Cardiac and Pulmonary Rehab  Education need identified 03/12/22       Education: Flexibility, Balance, Mind/Body Relaxation: - Group verbal and visual presentation with interactive activity on the components of exercise prescription. Introduces F.I.T.T principle from ACSM for exercise prescriptions. Reviews F.I.T.T. principles of flexibility and balance exercise training including progression. Also discusses the mind body connection.  Reviews various relaxation techniques to help reduce and manage stress (i.e. Deep breathing, progressive muscle relaxation, and visualization). Balance handout provided to take home. Written material given at graduation.   Activity Barriers & Risk Stratification:  Activity Barriers & Cardiac Risk Stratification - 03/12/22 1453       Activity Barriers & Cardiac Risk Stratification   Activity Barriers Back Problems;Arthritis   arth back and shoulders   Cardiac Risk Stratification Moderate             6 Minute Walk:  6 Minute Walk     Row Name 03/12/22 1451         6 Minute Walk   Phase Initial     Distance 1600 feet     Walk Time 6 minutes     # of Rest Breaks 0     MPH 3.03     METS 3.87     RPE 12     VO2 Peak 13.57     Symptoms No     Resting HR 63 bpm     Resting BP 122/64     Resting Oxygen Saturation  96 %     Exercise Oxygen Saturation  during 6 min walk 95 %     Max Ex. HR 102 bpm     Max Ex. BP 144/75     2 Minute Post BP 122/66              Oxygen Initial Assessment:   Oxygen Re-Evaluation:   Oxygen Discharge (Final Oxygen Re-Evaluation):   Initial Exercise Prescription:  Initial Exercise Prescription - 03/12/22 1400        Date of Initial Exercise RX and Referring Provider   Date 03/12/22    Referring Provider Dorothyann Peng MD      Oxygen   Maintain Oxygen Saturation 88% or higher      Treadmill   MPH 2.7    Grade 1    Minutes 15    METs 3.25      Elliptical   Level 1    Speed 2.5    Minutes 15    METs 3      REL-XR   Level 1    Speed 50    Minutes 15    METs 3      Track   Laps 38    Minutes 15    METs 3.07      Prescription Details   Frequency (times per week) 2  Duration Progress to 30 minutes of continuous aerobic without signs/symptoms of physical distress      Intensity   THRR 40-80% of Max Heartrate 100-137    Ratings of Perceived Exertion 11-13    Perceived Dyspnea 0-4      Progression   Progression Continue to progress workloads to maintain intensity without signs/symptoms of physical distress.      Resistance Training   Training Prescription Yes    Weight 7 lb    Reps 10-15             Perform Capillary Blood Glucose checks as needed.  Exercise Prescription Changes:   Exercise Prescription Changes     Row Name 03/12/22 1400             Response to Exercise   Blood Pressure (Admit) 122/64       Blood Pressure (Exercise) 144/74       Blood Pressure (Exit) 122/66       Heart Rate (Admit) 63 bpm       Heart Rate (Exercise) 102 bpm       Heart Rate (Exit) 63 bpm       Oxygen Saturation (Admit) 96 %       Oxygen Saturation (Exercise) 95 %       Rating of Perceived Exertion (Exercise) 12       Symptoms none       Comments walk test results                Exercise Comments:   Exercise Goals and Review:   Exercise Goals     Row Name 03/12/22 1501             Exercise Goals   Increase Physical Activity Yes       Intervention Provide advice, education, support and counseling about physical activity/exercise needs.;Develop an individualized exercise prescription for aerobic and resistive training based on initial evaluation findings,  risk stratification, comorbidities and participant's personal goals.       Expected Outcomes Long Term: Add in home exercise to make exercise part of routine and to increase amount of physical activity.;Long Term: Exercising regularly at least 3-5 days a week.;Short Term: Attend rehab on a regular basis to increase amount of physical activity.       Increase Strength and Stamina Yes       Intervention Develop an individualized exercise prescription for aerobic and resistive training based on initial evaluation findings, risk stratification, comorbidities and participant's personal goals.;Provide advice, education, support and counseling about physical activity/exercise needs.       Expected Outcomes Short Term: Increase workloads from initial exercise prescription for resistance, speed, and METs.;Long Term: Improve cardiorespiratory fitness, muscular endurance and strength as measured by increased METs and functional capacity ( );Short Term: Perform resistance training exercises routinely during rehab and add in resistance training at home       Able to understand and use rate of perceived exertion (RPE) scale Yes       Intervention Provide education and explanation on how to use RPE scale       Expected Outcomes Long Term:  Able to use RPE to guide intensity level when exercising independently;Short Term: Able to use RPE daily in rehab to express subjective intensity level       Able to understand and use Dyspnea scale Yes       Intervention Provide education and explanation on how to use Dyspnea scale       Expected  Outcomes Short Term: Able to use Dyspnea scale daily in rehab to express subjective sense of shortness of breath during exertion;Long Term: Able to use Dyspnea scale to guide intensity level when exercising independently       Knowledge and understanding of Target Heart Rate Range (THRR) Yes       Intervention Provide education and explanation of THRR including how the numbers were  predicted and where they are located for reference       Expected Outcomes Short Term: Able to state/look up THRR;Short Term: Able to use daily as guideline for intensity in rehab;Long Term: Able to use THRR to govern intensity when exercising independently       Able to check pulse independently Yes       Intervention Review the importance of being able to check your own pulse for safety during independent exercise;Provide education and demonstration on how to check pulse in carotid and radial arteries.       Expected Outcomes Short Term: Able to explain why pulse checking is important during independent exercise;Long Term: Able to check pulse independently and accurately       Understanding of Exercise Prescription Yes       Intervention Provide education, explanation, and written materials on patient's individual exercise prescription       Expected Outcomes Short Term: Able to explain program exercise prescription;Long Term: Able to explain home exercise prescription to exercise independently                Exercise Goals Re-Evaluation :   Discharge Exercise Prescription (Final Exercise Prescription Changes):  Exercise Prescription Changes - 03/12/22 1400       Response to Exercise   Blood Pressure (Admit) 122/64    Blood Pressure (Exercise) 144/74    Blood Pressure (Exit) 122/66    Heart Rate (Admit) 63 bpm    Heart Rate (Exercise) 102 bpm    Heart Rate (Exit) 63 bpm    Oxygen Saturation (Admit) 96 %    Oxygen Saturation (Exercise) 95 %    Rating of Perceived Exertion (Exercise) 12    Symptoms none    Comments walk test results             Nutrition:  Target Goals: Understanding of nutrition guidelines, daily intake of sodium 1500mg , cholesterol 200mg , calories 30% from fat and 7% or less from saturated fats, daily to have 5 or more servings of fruits and vegetables.  Education: All About Nutrition: -Group instruction provided by verbal, written material,  interactive activities, discussions, models, and posters to present general guidelines for heart healthy nutrition including fat, fiber, MyPlate, the role of sodium in heart healthy nutrition, utilization of the nutrition label, and utilization of this knowledge for meal planning. Follow up email sent as well. Written material given at graduation. Flowsheet Row Cardiac Rehab from 03/12/2022 in [facility] Cardiac and Pulmonary Rehab  Education need identified 03/12/22       Biometrics:  Pre Biometrics - 03/12/22 1502       Pre Biometrics   Height 6' 0.5" (1.842 m)    Weight 207 lb 12.8 oz (94.3 kg)    Waist Circumference 38.5 inches    Hip Circumference 39 inches    Waist to Hip Ratio 0.99 %    BMI (Calculated) 27.78    Single Leg Stand 2.5 seconds              Nutrition Therapy Plan and Nutrition Goals:  Nutrition Therapy & Goals -  03/12/22 1129       Nutrition Therapy   Diet Heart healthy, low Na    Drug/Food Interactions Statins/Certain Fruits    Protein (specify units) 75-85g    Fiber 30 grams    Whole Grain Foods 3 servings    Saturated Fats 16 max. grams    Fruits and Vegetables 8 servings/day    Sodium 2 grams      Personal Nutrition Goals   Nutrition Goal ST:  review paperwork, include snacks during the day to see how that affects hunger in evening LT: practice MyPlate guidelines, include 5-8 servings or fruit/vegetables per day    Comments 66 y.o. M admitted to cardiac rehab s/p NSTEMI. PMHx includes HTN, HLD, former tobacco use, CAD, a. fib. Relevant medications includes lexapro, MVI with minerals, crestor. B: oatmeal with bananas, walnuts, blueberries - if he gets breakfast out, he gets oatmeal from [employer] L: chicken salad or deli [place] on whole wheat bread D: chicken, pork (~1x/week), chooses lean ground meat (~1x/week), fish including salmon. He enjoys potatoes sometimes with his meals as well as non-starchy vegetbles such as broccoli. S: peanut butter - sometimes  with fruit. Tommy Rowe reports feeling hungry towards the end of the day - discussed including some snacks such as yogurt with nuts and fruit or peanut butter with fruit to eat during the day in-between meals. Tommy Rowe reports not salting any of his food and limiting eating out to no more than 2-3x/week. He will cook with olive oil, but does use some butter with a potato or on toast sparingly. Discussed MyPlate and heart healthy eating.      Intervention Plan   Intervention Prescribe, educate and counsel regarding individualized specific dietary modifications aiming towards targeted core components such as weight, hypertension, lipid management, diabetes, heart failure and other comorbidities.;Nutrition handout(s) given to patient.    Expected Outcomes Short Term Goal: Understand basic principles of dietary content, such as calories, fat, sodium, cholesterol and nutrients.;Short Term Goal: A plan has been developed with personal nutrition goals set during dietitian appointment.;Long Term Goal: Adherence to prescribed nutrition plan.             Nutrition Assessments:  MEDIFICTS Score Key: ?70 Need to make dietary changes  40-70 Heart Healthy Diet ? 40 Therapeutic Level Cholesterol Diet  Flowsheet Row [facility] Cardiac [facility]  Picture Your Plate Total Score on Admission 80      Picture Your Plate Scores: <86<40 Unhealthy dietary pattern with much room for improvement. 41-50 Dietary pattern unlikely to meet recommendations for good health and room for improvement. 51-60 More healthful dietary pattern, with some room for improvement.  >60 Healthy dietary pattern, although there may be some specific behaviors that could be improved.    Nutrition Goals Re-Evaluation:   Nutrition Goals Discharge (Final Nutrition Goals Re-Evaluation):   Psychosocial: Target Goals: Acknowledge presence or absence of significant depression and/or stress, maximize coping skills,  provide positive support system. Participant is able to verbalize types and ability to use techniques and skills needed for reducing stress and depression.   Education: Stress, Anxiety, and Depression - Group verbal and visual presentation to define topics covered.  Reviews how body is impacted by stress, anxiety, and depression.  Also discusses healthy ways to reduce stress and to treat/manage anxiety and depression.  Written material given at graduation.   Education: Sleep Hygiene -Provides group verbal and written instruction about how sleep can affect your health.  Define sleep hygiene,  discuss sleep cycles and impact of sleep habits. Review good sleep hygiene tips.    Initial Review & Psychosocial Screening:  Initial Psych Review & Screening - 02/14/22 0920       Initial Review   Current issues with Current Psychotropic Meds;Current Anxiety/Panic   lexapro is for anxiety, been taking for years. It continues to help.     Family Dynamics   Good Support System? Yes   wife, daughter grandkids,  step kids     Barriers   Psychosocial barriers to participate in program There are no identifiable barriers or psychosocial needs.      Screening Interventions   Interventions Encouraged to exercise;To provide support and resources with identified psychosocial needs;Provide feedback about the scores to participant    Expected Outcomes Short Term goal: Utilizing psychosocial counselor, staff and physician to assist with identification of specific Stressors or current issues interfering with healing process. Setting desired goal for each stressor or current issue identified.;Long Term Goal: Stressors or current issues are controlled or eliminated.;Short Term goal: Identification and review with participant of any Quality of Life or Depression concerns found by scoring the questionnaire.;Long Term goal: The participant improves quality of Life and PHQ9 Scores as seen by post scores and/or verbalization  of changes             Quality of Life Scores:   Quality of Life - 03/12/22 1502       Quality of Life   Select Quality of Life      Quality of Life Scores   Health/Function Pre 28 %    Socioeconomic Pre 23.94 %    Psych/Spiritual Pre 30 %    Family Pre 28.8 %    GLOBAL Pre 27.59 %            Scores of 19 and below usually indicate a poorer quality of life in these areas.  A difference of  2-3 points is a clinically meaningful difference.  A difference of 2-3 points in the total score of the Quality of Life Index has been associated with significant improvement in overall quality of life, self-image, physical symptoms, and general health in studies assessing change in quality of life.  PHQ-9: Review Flowsheet       03/12/2022  Depression screen PHQ 2/9  Decreased Interest 0  Down, Depressed, Hopeless 0  PHQ - 2 Score 0  Altered sleeping 1  Tired, decreased energy 0  Change in appetite 1  Feeling bad or failure about yourself  0  Trouble concentrating 0  Moving slowly or fidgety/restless 0  Suicidal thoughts 0  PHQ-9 Score 2  Difficult doing work/chores Not difficult at all   Interpretation of Total Score  Total Score Depression Severity:  1-4 = Minimal depression, 5-9 = Mild depression, 10-14 = Moderate depression, 15-19 = Moderately severe depression, 20-27 = Severe depression   Psychosocial Evaluation and Intervention:  Psychosocial Evaluation - 02/14/22 0943       Psychosocial Evaluation & Interventions   Interventions Encouraged to exercise with the program and follow exercise prescription    Comments Tommy Rowe has no barriers to attending the prgoram. He is ready to learn how to manage his heart disease and live as long as he can. He has a history of anxiety and is on Lexapro. He stated that this helps. He has changed jobs and his anxiety is not as bad now. His health care team has kept him on Lexapro"if it is working,don't change it".  Tommy Rowe  lives with his  wife. She and his daughter, grandchildren and step children are his support.  He is the support for his daughter and grandchildren. He is ready to get started with the program.    Expected Outcomes STG Tommy Rowe attends all scheduled sessions, he continues to keep anxiety levels low. LTG Tommy Rowe continues his exercise progression and continues to manage any anxiety symptoms after discharge from the program.    Continue Psychosocial Services  Follow up required by staff             Psychosocial Re-Evaluation:   Psychosocial Discharge (Final Psychosocial Re-Evaluation):   Vocational Rehabilitation: Provide vocational rehab assistance to qualifying candidates.   Vocational Rehab Evaluation & Intervention:   Education: Education Goals: Education classes will be provided on a variety of topics geared toward better understanding of heart health and risk factor modification. Participant will state understanding/return demonstration of topics presented as noted by education test scores.  Learning Barriers/Preferences:   General Cardiac Education Topics:  AED/CPR: - Group verbal and written instruction with the use of models to demonstrate the basic use of the AED with the basic ABC's of resuscitation.   Anatomy and Cardiac Procedures: - Group verbal and visual presentation and models provide information about basic cardiac anatomy and function. Reviews the testing methods done to diagnose heart disease and the outcomes of the test results. Describes the treatment choices: Medical Management, Angioplasty, or Coronary Bypass Surgery for treating various heart conditions including Myocardial Infarction, Angina, Valve Disease, and Cardiac Arrhythmias.  Written material given at graduation.   Medication Safety: - Group verbal and visual instruction to review commonly prescribed medications for heart and lung disease. Reviews the medication, class of the drug, and side effects. Includes the steps to  properly store meds and maintain the prescription regimen.  Written material given at graduation.   Intimacy: - Group verbal instruction through game format to discuss how heart and lung disease can affect sexual intimacy. Written material given at graduation..   Know Your Numbers and Heart Failure: - Group verbal and visual instruction to discuss disease risk factors for cardiac and pulmonary disease and treatment options.  Reviews associated critical values for Overweight/Obesity, Hypertension, Cholesterol, and Diabetes.  Discusses basics of heart failure: signs/symptoms and treatments.  Introduces Heart Failure Zone chart for action plan for heart failure.  Written material given at graduation. Flowsheet Row Cardiac Rehab from 03/12/2022 in [facility] Cardiac and Pulmonary Rehab  Education need identified 03/12/22       Infection Prevention: - Provides verbal and written material to individual with discussion of infection control including proper hand washing and proper equipment cleaning during exercise session. Flowsheet Row Cardiac Rehab from 03/12/2022 in [facility] Cardiac and Pulmonary Rehab  Date 03/12/22  Educator [facility]  Instruction Review Code 1- Verbalizes Understanding       Falls Prevention: - Provides verbal and written material to individual with discussion of falls prevention and safety. Flowsheet Row Cardiac Rehab from 03/12/2022 in [facility] Cardiac and Pulmonary Rehab  Date 02/14/22  Educator SB  Instruction Review Code 1- Verbalizes Understanding       Other: -Provides group and verbal instruction on various topics (see comments)   Knowledge Questionnaire Score:  Knowledge Questionnaire Score - 03/12/22 1503       Knowledge Questionnaire Score   Pre Score 22/26             Core Components/Risk Factors/Patient Goals at Admission:  Personal Goals and Risk Factors at Admission - 03/12/22  1503       Core Components/Risk Factors/Patient Goals on Admission     Weight Management Yes;Weight Loss    Intervention Weight Management: Develop a combined nutrition and exercise program designed to reach desired caloric intake, while maintaining appropriate intake of nutrient and fiber, sodium and fats, and appropriate energy expenditure required for the weight goal.;Weight Management/Obesity: Establish reasonable short term and long term weight goals.;Weight Management: Provide education and appropriate resources to help participant work on and attain dietary goals.    Admit Weight 207 lb 12.8 oz (94.3 kg)    Goal Weight: Short Term 203 lb (92.1 kg)    Goal Weight: Long Term 195 lb (88.5 kg)    Expected Outcomes Short Term: Continue to assess and modify interventions until short term weight is achieved;Long Term: Adherence to nutrition and physical activity/exercise program aimed toward attainment of established weight goal;Weight Loss: Understanding of general recommendations for a balanced deficit meal plan, which promotes 1-2 lb weight loss per week and includes a negative energy balance of [phone] kcal/d;Understanding recommendations for meals to include 15-35% energy as protein, 25-35% energy from fat, 35-60% energy from carbohydrates, less than  of dietary cholesterol, 20-35 gm of total fiber daily;Understanding of distribution of calorie intake throughout the day with the consumption of 4-5 meals/snacks    Hypertension Yes    Intervention Provide education on lifestyle modifcations including regular physical activity/exercise, weight management, moderate sodium restriction and increased consumption of fresh fruit, vegetables, and low fat dairy, alcohol moderation, and smoking cessation.;Monitor prescription use compliance.    Expected Outcomes Short Term: Continued assessment and intervention until BP is < 140/62mm HG in hypertensive participants. < 130/42mm HG in hypertensive participants with diabetes, heart failure or chronic kidney disease.;Long Term:  Maintenance of blood pressure at goal levels.    Lipids Yes    Intervention Provide education and support for participant on nutrition & aerobic/resistive exercise along with prescribed medications to achieve LDL 70mg , HDL >40mg .    Expected Outcomes Short Term: Participant states understanding of desired cholesterol values and is compliant with medications prescribed. Participant is following exercise prescription and nutrition guidelines.;Long Term: Cholesterol controlled with medications as prescribed, with individualized exercise RX and with personalized nutrition plan. Value goals: LDL < , HDL > 40 mg.             Education:Diabetes - Individual verbal and written instruction to review signs/symptoms of diabetes, desired ranges of glucose level fasting, after meals and with exercise. Acknowledge that pre and post exercise glucose checks will be done for 3 sessions at entry of program.   Core Components/Risk Factors/Patient Goals Review:    Core Components/Risk Factors/Patient Goals at Discharge (Final Review):    ITP Comments:  ITP Comments     Row Name 02/14/22 0941 03/12/22 1451         ITP Comments Virtual orientation call completed today. he has an appointment on Date: 02/26/2022  for EP eval and gym Orientation.  Documentation of diagnosis can be found in [facility] Date: 09/17/2021 .       Tommy Rowe took time researching Cardiac Rehab before committing to the program. Completed and gym orientation. Initial ITP created and sent for review to Dr. Bethann Punches, Medical Director.               Comments: Initial ITP

## 2022-03-12 NOTE — Patient Instructions (Signed)
Patient Instructions  Patient Details  Name: Tommy Rowe MRN: [id number] Date of Birth: [birth year] Referring Provider:  Alwyn Pea, MD  Below are your personal goals for exercise, nutrition, and risk factors. Our goal is to help you stay on track towards obtaining and maintaining these goals. We will be discussing your progress on these goals with you throughout the program.  Initial Exercise Prescription:  Initial Exercise Prescription - 03/12/22 1400       Date of Initial Exercise RX and Referring Provider   Date 03/12/22    Referring Provider Dorothyann Peng MD      Oxygen   Maintain Oxygen Saturation 88% or higher      Treadmill   MPH 2.7    Grade 1    Minutes 15    METs 3.25      Elliptical   Level 1    Speed 2.5    Minutes 15    METs 3      REL-XR   Level 1    Speed 50    Minutes 15    METs 3      Track   Laps 38    Minutes 15    METs 3.07      Prescription Details   Frequency (times per week) 2    Duration Progress to 30 minutes of continuous aerobic without signs/symptoms of physical distress      Intensity   THRR 40-80% of Max Heartrate 100-137    Ratings of Perceived Exertion 11-13    Perceived Dyspnea 0-4      Progression   Progression Continue to progress workloads to maintain intensity without signs/symptoms of physical distress.      Resistance Training   Training Prescription Yes    Weight 7 lb    Reps 10-15             Exercise Goals: Frequency: Be able to perform aerobic exercise two to three times per week in program working toward 2-5 days per week of home exercise.  Intensity: Work with a perceived exertion of 11 (fairly light) - 15 (hard) while following your exercise prescription.  We will make changes to your prescription with you as you progress through the program.   Duration: Be able to do 30 to 45 minutes of continuous aerobic exercise in addition to a 5 minute warm-up and a 5 minute cool-down routine.    Nutrition Goals: Your personal nutrition goals will be established when you do your nutrition analysis with the dietician.  The following are general nutrition guidelines to follow: Cholesterol < 200mg /day Sodium < 1500mg /day Fiber: Men over 50 yrs - 30 grams per day  Personal Goals:  Personal Goals and Risk Factors at Admission - 03/12/22 1503       Core Components/Risk Factors/Patient Goals on Admission    Weight Management Yes;Weight Loss    Intervention Weight Management: Develop a combined nutrition and exercise program designed to reach desired caloric intake, while maintaining appropriate intake of nutrient and fiber, sodium and fats, and appropriate energy expenditure required for the weight goal.;Weight Management/Obesity: Establish reasonable short term and long term weight goals.;Weight Management: Provide education and appropriate resources to help participant work on and attain dietary goals.    Admit Weight 207 lb 12.8 oz (94.3 kg)    Goal Weight: Short Term 203 lb (92.1 kg)    Goal Weight: Long Term 195 lb (88.5 kg)    Expected Outcomes Short Term: Continue to assess and  modify interventions until short term weight is achieved;Long Term: Adherence to nutrition and physical activity/exercise program aimed toward attainment of established weight goal;Weight Loss: Understanding of general recommendations for a balanced deficit meal plan, which promotes 1-2 lb weight loss per week and includes a negative energy balance of [phone] kcal/d;Understanding recommendations for meals to include 15-35% energy as protein, 25-35% energy from fat, 35-60% energy from carbohydrates, less than 200mg  of dietary cholesterol, 20-35 gm of total fiber daily;Understanding of distribution of calorie intake throughout the day with the consumption of 4-5 meals/snacks    Hypertension Yes    Intervention Provide education on lifestyle modifcations including regular physical activity/exercise, weight  management, moderate sodium restriction and increased consumption of fresh fruit, vegetables, and low fat dairy, alcohol moderation, and smoking cessation.;Monitor prescription use compliance.    Expected Outcomes Short Term: Continued assessment and intervention until BP is < 140/5mm HG in hypertensive participants. < 130/50mm HG in hypertensive participants with diabetes, heart failure or chronic kidney disease.;Long Term: Maintenance of blood pressure at goal levels.    Lipids Yes    Intervention Provide education and support for participant on nutrition & aerobic/resistive exercise along with prescribed medications to achieve LDL 70mg , HDL >40mg .    Expected Outcomes Short Term: Participant states understanding of desired cholesterol values and is compliant with medications prescribed. Participant is following exercise prescription and nutrition guidelines.;Long Term: Cholesterol controlled with medications as prescribed, with individualized exercise RX and with personalized nutrition plan. Value goals: LDL < 70mg , HDL > 40 mg.             Tobacco Use Initial Evaluation: Social History   Tobacco Use  Smoking Status Former   Types: Cigarettes   Quit date: 04/02/1988   Years since quitting: 33.9   Passive exposure: Past  Smokeless Tobacco Never    Exercise Goals and Review:  Exercise Goals     Row Name 03/12/22 1501             Exercise Goals   Increase Physical Activity Yes       Intervention Provide advice, education, support and counseling about physical activity/exercise needs.;Develop an individualized exercise prescription for aerobic and resistive training based on initial evaluation findings, risk stratification, comorbidities and participant's personal goals.       Expected Outcomes Long Term: Add in home exercise to make exercise part of routine and to increase amount of physical activity.;Long Term: Exercising regularly at least 3-5 days a week.;Short Term: Attend  rehab on a regular basis to increase amount of physical activity.       Increase Strength and Stamina Yes       Intervention Develop an individualized exercise prescription for aerobic and resistive training based on initial evaluation findings, risk stratification, comorbidities and participant's personal goals.;Provide advice, education, support and counseling about physical activity/exercise needs.       Expected Outcomes Short Term: Increase workloads from initial exercise prescription for resistance, speed, and METs.;Long Term: Improve cardiorespiratory fitness, muscular endurance and strength as measured by increased METs and functional capacity (05/31/1988);Short Term: Perform resistance training exercises routinely during rehab and add in resistance training at home       Able to understand and use rate of perceived exertion (RPE) scale Yes       Intervention Provide education and explanation on how to use RPE scale       Expected Outcomes Long Term:  Able to use RPE to guide intensity level when exercising independently;Short Term: Able to  use RPE daily in rehab to express subjective intensity level       Able to understand and use Dyspnea scale Yes       Intervention Provide education and explanation on how to use Dyspnea scale       Expected Outcomes Short Term: Able to use Dyspnea scale daily in rehab to express subjective sense of shortness of breath during exertion;Long Term: Able to use Dyspnea scale to guide intensity level when exercising independently       Knowledge and understanding of Target Heart Rate Range (THRR) Yes       Intervention Provide education and explanation of THRR including how the numbers were predicted and where they are located for reference       Expected Outcomes Short Term: Able to state/look up THRR;Short Term: Able to use daily as guideline for intensity in rehab;Long Term: Able to use THRR to govern intensity when exercising independently       Able to check pulse  independently Yes       Intervention Review the importance of being able to check your own pulse for safety during independent exercise;Provide education and demonstration on how to check pulse in carotid and radial arteries.       Expected Outcomes Short Term: Able to explain why pulse checking is important during independent exercise;Long Term: Able to check pulse independently and accurately       Understanding of Exercise Prescription Yes       Intervention Provide education, explanation, and written materials on patient's individual exercise prescription       Expected Outcomes Short Term: Able to explain program exercise prescription;Long Term: Able to explain home exercise prescription to exercise independently              Copy of goals given to participant.

## 2022-03-15 ENCOUNTER — Encounter: Payer: Medicare Other | Admitting: *Deleted

## 2022-03-15 DIAGNOSIS — Z955 Presence of coronary angioplasty implant and graft: Secondary | ICD-10-CM | POA: Diagnosis not present

## 2022-03-15 DIAGNOSIS — I214 Non-ST elevation (NSTEMI) myocardial infarction: Secondary | ICD-10-CM

## 2022-03-15 NOTE — Progress Notes (Signed)
Daily Session Note  Patient Details  Name: Tommy Rowe MRN: [id number] Date of Birth: July 30, 1955 Referring Provider:   Flowsheet Row Cardiac Rehab from 03/12/2022 in [facility] Cardiac and Pulmonary Rehab  Referring Provider Lujean Amel MD       Encounter Date: 03/15/2022  Check In:  Session Check In - 03/15/22 1000       Check-In   Supervising physician immediately available to respond to emergencies See telemetry face sheet for immediately available ER MD    Location [facility]-Cardiac & Pulmonary Rehab    Staff Present Darlyne Russian, RN, Viviana Simpler, RDN, LDN;Jessica Luan Pulling, MA, RCEP, CCRP, CCET    Virtual Visit No    Medication changes reported     No    Fall or balance concerns reported    No    Warm-up and Cool-down Performed on first and last piece of equipment    Resistance Training Performed Yes    VAD Patient? No    PAD/SET Patient? No      Pain Assessment   Currently in Pain? No/denies                Social History   Tobacco Use  Smoking Status Former   Types: Cigarettes   Quit date: 04/02/1988   Years since quitting: 33.9   Passive exposure: Past  Smokeless Tobacco Never    Goals Met:  Independence with exercise equipment Exercise tolerated well No report of concerns or symptoms today Strength training completed today  Goals Unmet:  Not Applicable  Comments: First full day of exercise!  Patient was oriented to gym and equipment including functions, settings, policies, and procedures.  Patient's individual exercise prescription and treatment plan were reviewed.  All starting workloads were established based on the results of the 6 minute walk test done at initial orientation visit.  The plan for exercise progression was also introduced and progression will be customized based on patient's performance and goals.    Dr. Emily Filbert is Medical Director for [facility].  Dr. Ottie Glazier is Medical Director for  [facility].

## 2022-03-19 ENCOUNTER — Other Ambulatory Visit: Payer: Medicare Other | Admitting: Urology

## 2022-03-20 ENCOUNTER — Encounter: Payer: Medicare Other | Admitting: *Deleted

## 2022-03-20 DIAGNOSIS — Z955 Presence of coronary angioplasty implant and graft: Secondary | ICD-10-CM | POA: Diagnosis not present

## 2022-03-20 DIAGNOSIS — I214 Non-ST elevation (NSTEMI) myocardial infarction: Secondary | ICD-10-CM

## 2022-03-20 NOTE — Progress Notes (Signed)
Daily Session Note  Patient Details  Name: Tommy Rowe MRN: [id number] Date of Birth: 11/05/1955 Referring Provider:   Flowsheet Row Cardiac Rehab from 03/12/2022 in [facility] Cardiac and Pulmonary Rehab  Referring Provider Callwood, Dwayne MD       Encounter Date: 03/20/2022  Check In:  Session Check In - 03/20/22 1011       Check-In   Supervising physician immediately available to respond to emergencies See telemetry face sheet for immediately available ER MD    Location [facility]-Cardiac & Pulmonary Rehab    Staff Present Susanne Bice, RN, BSN, CCRP;Jessica Hawkins, MA, RCEP, CCRP, CCET;Kara Williamson, MS, ACSM CEP, Exercise Physiologist;Meredith Craven, RN BSN    Virtual Visit No    Medication changes reported     No    Fall or balance concerns reported    No    Warm-up and Cool-down Performed on first and last piece of equipment    Resistance Training Performed Yes    VAD Patient? No    PAD/SET Patient? No      Pain Assessment   Currently in Pain? No/denies               Exercise Prescription Changes - 03/20/22 1000       Home Exercise Plan   Plans to continue exercise at Home (comment)   walking, treadmill, elliptical   Frequency Add 2 additional days to program exercise sessions.    Initial Home Exercises Provided 03/20/22             Social History   Tobacco Use  Smoking Status Former   Types: Cigarettes   Quit date: 04/02/1988   Years since quitting: 33.9   Passive exposure: Past  Smokeless Tobacco Never    Goals Met:  Independence with exercise equipment Exercise tolerated well No report of concerns or symptoms today  Goals Unmet:  Not Applicable  Comments: Pt able to follow exercise prescription today without complaint.  Will continue to monitor for progression.    Dr. Mark Miller is Medical Director for [facility].  Dr. Fuad Aleskerov is Medical Director for [facility]. 

## 2022-03-22 ENCOUNTER — Encounter: Payer: Medicare Other | Admitting: *Deleted

## 2022-03-22 DIAGNOSIS — Z955 Presence of coronary angioplasty implant and graft: Secondary | ICD-10-CM | POA: Diagnosis not present

## 2022-03-22 DIAGNOSIS — I214 Non-ST elevation (NSTEMI) myocardial infarction: Secondary | ICD-10-CM

## 2022-03-22 NOTE — Progress Notes (Signed)
Daily Session Note  Patient Details  Name: DEQUANN VANDERVELDEN MRN: [id number] Date of Birth: 13-Jun-1955 Referring Provider:   Flowsheet Row Cardiac Rehab from 03/12/2022 in [facility] Cardiac and Pulmonary Rehab  Referring Provider Lujean Amel MD       Encounter Date: 03/22/2022  Check In:  Session Check In - 03/22/22 1008       Check-In   Supervising physician immediately available to respond to emergencies See telemetry face sheet for immediately available ER MD    Location [facility]-Cardiac & Pulmonary Rehab    Staff Present Heath Lark, RN, BSN, CCRP;Joseph Yoder, RCP,RRT,BSRT;Noah Tickle, [place], Exercise Physiologist;Meredith Sherryll Burger, RN BSN    Virtual Visit No    Medication changes reported     No    Fall or balance concerns reported    No    Warm-up and Cool-down Performed on first and last piece of equipment    Resistance Training Performed Yes    VAD Patient? No    PAD/SET Patient? No      Pain Assessment   Currently in Pain? No/denies                Social History   Tobacco Use  Smoking Status Former   Types: Cigarettes   Quit date: 04/02/1988   Years since quitting: 33.9   Passive exposure: Past  Smokeless Tobacco Never    Goals Met:  Independence with exercise equipment Exercise tolerated well No report of concerns or symptoms today  Goals Unmet:  Not Applicable  Comments: Pt able to follow exercise prescription today without complaint.  Will continue to monitor for progression.    Dr. Emily Filbert is Medical Director for [facility].  Dr. Ottie Glazier is Medical Director for [facility].

## 2022-03-23 ENCOUNTER — Ambulatory Visit
Admission: RE | Admit: 2022-03-23 | Discharge: 2022-03-23 | Disposition: A | Discharge status: Home / Self Care | Payer: Medicare Other | Source: Ambulatory Visit | Attending: Urology | Admitting: Urology

## 2022-03-23 DIAGNOSIS — R310 Gross hematuria: Secondary | ICD-10-CM | POA: Insufficient documentation

## 2022-03-23 MED ORDER — IOHEXOL 300 MG/ML  SOLN
100.0000 mL | Freq: Once | INTRAMUSCULAR | Status: AC | PRN
  Administered 2022-03-23: 100 mL via INTRAVENOUS

## 2022-03-28 ENCOUNTER — Encounter: Payer: Self-pay | Admitting: *Deleted

## 2022-03-28 DIAGNOSIS — I214 Non-ST elevation (NSTEMI) myocardial infarction: Secondary | ICD-10-CM

## 2022-03-28 DIAGNOSIS — Z955 Presence of coronary angioplasty implant and graft: Secondary | ICD-10-CM

## 2022-03-28 NOTE — Progress Notes (Signed)
Cardiac Individual Treatment Plan  Patient Details  Name: Tommy Rowe MRN: [id number] Date of Birth: Jul 18, 1955 Referring Provider:   Flowsheet Row Cardiac Rehab from 03/12/2022 in [facility] Cardiac and Pulmonary Rehab  Referring Provider Lujean Amel MD       Initial Encounter Date:  Flowsheet Row Cardiac Rehab from 03/12/2022 in [facility] Cardiac and Pulmonary Rehab  Date 03/12/22       Visit Diagnosis: NSTEMI (non-ST elevation myocardial infarction) [facility])  Status post coronary artery stent placement  Patient's Home Medications on Admission:  Current Outpatient Medications:    amiodarone (PACERONE) 100 MG tablet, Take 100 mg by mouth daily., Disp: , Rfl:    ELIQUIS 5 MG TABS tablet, Take 5 mg by mouth 2 (two) times daily., Disp: , Rfl:    escitalopram (LEXAPRO) 20 MG tablet, Take 20 mg by mouth at bedtime., Disp: , Rfl: 1   losartan-hydrochlorothiazide (HYZAAR) 100-25 MG tablet, Take 1 tablet by mouth daily., Disp: , Rfl:    Multiple Vitamin (MULTIVITAMIN WITH MINERALS) TABS tablet, Take 1 tablet by mouth daily. Centrum Silver for Men 50+, Disp: , Rfl:    nitroGLYCERIN (NITROSTAT) 0.4 MG SL tablet, Place 1 tablet (0.4 mg total) under the tongue every 5 (five) minutes as needed for chest pain., Disp: 1 tablet, Rfl: 12   rosuvastatin (CRESTOR) 20 MG tablet, Take 20 mg by mouth at bedtime., Disp: , Rfl:    triamcinolone cream (KENALOG) 0.1 %, APPLY THIN LAYER TOPICALLY TO THE AFFECTED AREA TWICE DAILY, Disp: , Rfl:  No current facility-administered medications for this visit.  Facility-Administered Medications Ordered in Other Visits:    sodium chloride flush (NS) 0.9 % injection 3 mL, 3 mL, Intravenous, Q12H, Scoggins, Amber, NP  Past Medical History: Past Medical History:  Diagnosis Date   A-fib ([place])    Anxiety    HLD (hyperlipidemia)    HTN (hypertension)     Tobacco Use: Social History   Tobacco Use  Smoking Status Former   Types: Cigarettes   Quit date:  04/02/1988   Years since quitting: 34.0   Passive exposure: Past  Smokeless Tobacco Never    Labs: Review Flowsheet       Latest Ref Rng & Units 09/17/2021  Labs for ITP Cardiac and Pulmonary Rehab  Cholestrol 0 - 200 mg/dL 109   LDL (calc) 0 - 99 mg/dL 55   HDL-C >40 mg/dL 49   Trlycerides <150 mg/dL 23   Hemoglobin A1c 4.8 - 5.6 % 5.7      Exercise Target Goals: Exercise Program Goal: Individual exercise prescription set using results from initial 6 min walk test and THRR while considering  patient's activity barriers and safety.   Exercise Prescription Goal: Initial exercise prescription builds to 30-45 minutes a day of aerobic activity, 2-3 days per week.  Home exercise guidelines will be given to patient during program as part of exercise prescription that the participant will acknowledge.   Education: Aerobic Exercise: - Group verbal and visual presentation on the components of exercise prescription. Introduces F.I.T.T principle from ACSM for exercise prescriptions.  Reviews F.I.T.T. principles of aerobic exercise including progression. Written material given at graduation. Flowsheet Row Cardiac Rehab from 03/22/2022 in [facility] Cardiac and Pulmonary Rehab  Education need identified 03/12/22       Education: Resistance Exercise: - Group verbal and visual presentation on the components of exercise prescription. Introduces F.I.T.T principle from ACSM for exercise prescriptions  Reviews F.I.T.T. principles of resistance exercise including progression. Written material given  at graduation.    Education: Exercise & Equipment Safety: - Individual verbal instruction and demonstration of equipment use and safety with use of the equipment. Flowsheet Row Cardiac Rehab from 03/22/2022 in [facility] Cardiac and Pulmonary Rehab  Date 03/12/22  Educator [facility]  Instruction Review Code 1- Verbalizes Understanding       Education: Exercise Physiology & General Exercise Guidelines: - Group  verbal and written instruction with models to review the exercise physiology of the cardiovascular system and associated critical values. Provides general exercise guidelines with specific guidelines to those with heart or lung disease.  Flowsheet Row Cardiac Rehab from 03/22/2022 in [facility] Cardiac and Pulmonary Rehab  Education need identified 03/12/22       Education: Flexibility, Balance, Mind/Body Relaxation: - Group verbal and visual presentation with interactive activity on the components of exercise prescription. Introduces F.I.T.T principle from ACSM for exercise prescriptions. Reviews F.I.T.T. principles of flexibility and balance exercise training including progression. Also discusses the mind body connection.  Reviews various relaxation techniques to help reduce and manage stress (i.e. Deep breathing, progressive muscle relaxation, and visualization). Balance handout provided to take home. Written material given at graduation.   Activity Barriers & Risk Stratification:  Activity Barriers & Cardiac Risk Stratification - 03/12/22 1453       Activity Barriers & Cardiac Risk Stratification   Activity Barriers Back Problems;Arthritis   arth back and shoulders   Cardiac Risk Stratification Moderate             6 Minute Walk:  6 Minute Walk     Row Name 03/12/22 1451         6 Minute Walk   Phase Initial     Distance 1600 feet     Walk Time 6 minutes     # of Rest Breaks 0     MPH 3.03     METS 3.87     RPE 12     VO2 Peak 13.57     Symptoms No     Resting HR 63 bpm     Resting BP 122/64     Resting Oxygen Saturation  96 %     Exercise Oxygen Saturation  during 6 min walk 95 %     Max Ex. HR 102 bpm     Max Ex. BP 144/75     2 Minute Post BP 122/66              Oxygen Initial Assessment:   Oxygen Re-Evaluation:   Oxygen Discharge (Final Oxygen Re-Evaluation):   Initial Exercise Prescription:  Initial Exercise Prescription - 03/12/22 1400        Date of Initial Exercise RX and Referring Provider   Date 03/12/22    Referring Provider Lujean Amel MD      Oxygen   Maintain Oxygen Saturation 88% or higher      Treadmill   MPH 2.7    Grade 1    Minutes 15    METs 3.25      Elliptical   Level 1    Speed 2.5    Minutes 15    METs 3      REL-XR   Level 1    Speed 50    Minutes 15    METs 3      Track   Laps 38    Minutes 15    METs 3.07      Prescription Details   Frequency (times per week) 2  Duration Progress to 30 minutes of continuous aerobic without signs/symptoms of physical distress      Intensity   THRR 40-80% of Max Heartrate 100-137    Ratings of Perceived Exertion 11-13    Perceived Dyspnea 0-4      Progression   Progression Continue to progress workloads to maintain intensity without signs/symptoms of physical distress.      Resistance Training   Training Prescription Yes    Weight 7 lb    Reps 10-15             Perform Capillary Blood Glucose checks as needed.  Exercise Prescription Changes:   Exercise Prescription Changes     Row Name 03/12/22 1400 03/20/22 1000 03/27/22 1500         Response to Exercise   Blood Pressure (Admit) 122/64 -- 108/62     Blood Pressure (Exercise) 144/74 -- 148/70     Blood Pressure (Exit) 122/66 -- 102/58     Heart Rate (Admit) 63 bpm -- 67 bpm     Heart Rate (Exercise) 102 bpm -- 122 bpm     Heart Rate (Exit) 63 bpm -- 82 bpm     Oxygen Saturation (Admit) 96 % -- --     Oxygen Saturation (Exercise) 95 % -- --     Rating of Perceived Exertion (Exercise) 12 -- 15     Symptoms none -- none     Comments walk test results -- 3rd full day of exercise     Duration -- -- Continue with 30 min of aerobic exercise without signs/symptoms of physical distress.     Intensity -- -- THRR unchanged       Progression   Progression -- -- Continue to progress workloads to maintain intensity without signs/symptoms of physical distress.     Average METs --  -- 3.48       Resistance Training   Training Prescription -- -- Yes     Weight -- -- 7 lb     Reps -- -- 10-15       Interval Training   Interval Training -- -- No       Treadmill   MPH -- -- 2.7     Grade -- -- 1     Minutes -- -- 15     METs -- -- 3.44       Elliptical   Level -- -- 1     Speed -- -- 2.5     Minutes -- -- 15       REL-XR   Level -- -- 3     Minutes -- -- 15     METs -- -- 3.5       Home Exercise Plan   Plans to continue exercise at -- Home (comment)  walking, treadmill, elliptical Home (comment)  walking, treadmill, elliptical     Frequency -- Add 2 additional days to program exercise sessions. Add 2 additional days to program exercise sessions.     Initial Home Exercises Provided -- 03/20/22 03/20/22       Oxygen   Maintain Oxygen Saturation -- -- 88% or higher              Exercise Comments:   Exercise Comments     Row Name 03/15/22 1001           Exercise Comments First full day of exercise!  Patient was oriented to gym and equipment including functions, settings, policies, and procedures.  Patient's individual  exercise prescription and treatment plan were reviewed.  All starting workloads were established based on the results of the 6 minute walk test done at initial orientation visit.  The plan for exercise progression was also introduced and progression will be customized based on patient's performance and goals.                Exercise Goals and Review:   Exercise Goals     Row Name 03/12/22 1501             Exercise Goals   Increase Physical Activity Yes       Intervention Provide advice, education, support and counseling about physical activity/exercise needs.;Develop an individualized exercise prescription for aerobic and resistive training based on initial evaluation findings, risk stratification, comorbidities and participant's personal goals.       Expected Outcomes Long Term: Add in home exercise to make exercise  part of routine and to increase amount of physical activity.;Long Term: Exercising regularly at least 3-5 days a week.;Short Term: Attend rehab on a regular basis to increase amount of physical activity.       Increase Strength and Stamina Yes       Intervention Develop an individualized exercise prescription for aerobic and resistive training based on initial evaluation findings, risk stratification, comorbidities and participant's personal goals.;Provide advice, education, support and counseling about physical activity/exercise needs.       Expected Outcomes Short Term: Increase workloads from initial exercise prescription for resistance, speed, and METs.;Long Term: Improve cardiorespiratory fitness, muscular endurance and strength as measured by increased METs and functional capacity (6MWT);Short Term: Perform resistance training exercises routinely during rehab and add in resistance training at home       Able to understand and use rate of perceived exertion (RPE) scale Yes       Intervention Provide education and explanation on how to use RPE scale       Expected Outcomes Long Term:  Able to use RPE to guide intensity level when exercising independently;Short Term: Able to use RPE daily in rehab to express subjective intensity level       Able to understand and use Dyspnea scale Yes       Intervention Provide education and explanation on how to use Dyspnea scale       Expected Outcomes Short Term: Able to use Dyspnea scale daily in rehab to express subjective sense of shortness of breath during exertion;Long Term: Able to use Dyspnea scale to guide intensity level when exercising independently       Knowledge and understanding of Target Heart Rate Range (THRR) Yes       Intervention Provide education and explanation of THRR including how the numbers were predicted and where they are located for reference       Expected Outcomes Short Term: Able to state/look up THRR;Short Term: Able to use daily as  guideline for intensity in rehab;Long Term: Able to use THRR to govern intensity when exercising independently       Able to check pulse independently Yes       Intervention Review the importance of being able to check your own pulse for safety during independent exercise;Provide education and demonstration on how to check pulse in carotid and radial arteries.       Expected Outcomes Short Term: Able to explain why pulse checking is important during independent exercise;Long Term: Able to check pulse independently and accurately       Understanding of Exercise Prescription Yes  Intervention Provide education, explanation, and written materials on patient's individual exercise prescription       Expected Outcomes Short Term: Able to explain program exercise prescription;Long Term: Able to explain home exercise prescription to exercise independently                Exercise Goals Re-Evaluation :  Exercise Goals Re-Evaluation     Row Name 03/15/22 1001 03/20/22 1001 03/27/22 1508         Exercise Goal Re-Evaluation   Exercise Goals Review Able to understand and use rate of perceived exertion (RPE) scale;Able to understand and use Dyspnea scale;Knowledge and understanding of Target Heart Rate Range (THRR);Understanding of Exercise Prescription Able to understand and use rate of perceived exertion (RPE) scale;Able to understand and use Dyspnea scale;Knowledge and understanding of Target Heart Rate Range (THRR);Understanding of Exercise Prescription;Increase Physical Activity;Increase Strength and Stamina;Able to check pulse independently Increase Physical Activity;Understanding of Exercise Prescription;Increase Strength and Stamina     Comments Reviewed RPE scale, THR and program prescription with pt today.  Pt voiced understanding and was given a copy of goals to take home. Reviewed home exercise with pt today.  Pt plans to walk and use personal treadmill and elliptical at home for exercise.   Reviewed THR, pulse, RPE, sign and symptoms, pulse oximetery and when to call 911 or MD.  Also discussed weather considerations and indoor options.  Pt voiced understanding. Tommy Rowe is off to a good start in rehab.  He has completed 3 full day sessions thus far.  He is already up to 3.5 METs on the XR.  We will conitnue to monitor his progress.     Expected Outcomes Short: Use RPE daily to regulate intensity. Long: Follow program prescription in THR. Short: Start to add in exercise at home Long; Conitnue to improve stamina Short: Continue to attend rehab regularly Long: continue to follow program presrcription              Discharge Exercise Prescription (Final Exercise Prescription Changes):  Exercise Prescription Changes - 03/27/22 1500       Response to Exercise   Blood Pressure (Admit) 108/62    Blood Pressure (Exercise) 148/70    Blood Pressure (Exit) 102/58    Heart Rate (Admit) 67 bpm    Heart Rate (Exercise) 122 bpm    Heart Rate (Exit) 82 bpm    Rating of Perceived Exertion (Exercise) 15    Symptoms none    Comments 3rd full day of exercise    Duration Continue with 30 min of aerobic exercise without signs/symptoms of physical distress.    Intensity THRR unchanged      Progression   Progression Continue to progress workloads to maintain intensity without signs/symptoms of physical distress.    Average METs 3.48      Resistance Training   Training Prescription Yes    Weight 7 lb    Reps 10-15      Interval Training   Interval Training No      Treadmill   MPH 2.7    Grade 1    Minutes 15    METs 3.44      Elliptical   Level 1    Speed 2.5    Minutes 15      REL-XR   Level 3    Minutes 15    METs 3.5      Home Exercise Plan   Plans to continue exercise at Home (comment)   walking, treadmill, elliptical  Frequency Add 2 additional days to program exercise sessions.    Initial Home Exercises Provided 03/20/22      Oxygen   Maintain Oxygen Saturation 88% or  higher             Nutrition:  Target Goals: Understanding of nutrition guidelines, daily intake of sodium 1500mg , cholesterol 200mg , calories 30% from fat and 7% or less from saturated fats, daily to have 5 or more servings of fruits and vegetables.  Education: All About Nutrition: -Group instruction provided by verbal, written material, interactive activities, discussions, models, and posters to present general guidelines for heart healthy nutrition including fat, fiber, MyPlate, the role of sodium in heart healthy nutrition, utilization of the nutrition label, and utilization of this knowledge for meal planning. Follow up email sent as well. Written material given at graduation. Flowsheet Row Cardiac Rehab from 03/22/2022 in [facility] Cardiac and Pulmonary Rehab  Education need identified 03/12/22       Biometrics:  Pre Biometrics - 03/12/22 1502       Pre Biometrics   Height 6' 0.5" (1.842 m)    Weight 207 lb 12.8 oz (94.3 kg)    Waist Circumference 38.5 inches    Hip Circumference 39 inches    Waist to Hip Ratio 0.99 %    BMI (Calculated) 27.78    Single Leg Stand 2.5 seconds              Nutrition Therapy Plan and Nutrition Goals:  Nutrition Therapy & Goals - 03/12/22 1129       Nutrition Therapy   Diet Heart healthy, low Na    Drug/Food Interactions Statins/Certain Fruits    Protein (specify units) 75-85g    Fiber 30 grams    Whole Grain Foods 3 servings    Saturated Fats 16 max. grams    Fruits and Vegetables 8 servings/day    Sodium 2 grams      Personal Nutrition Goals   Nutrition Goal ST:  review paperwork, include snacks during the day to see how that affects hunger in evening LT: practice MyPlate guidelines, include 5-8 servings or fruit/vegetables per day    Comments 66 y.o. M admitted to cardiac rehab s/p NSTEMI. PMHx includes HTN, HLD, former tobacco use, CAD, a. fib. Relevant medications includes lexapro, MVI with minerals, crestor. B: oatmeal with  bananas, walnuts, blueberries - if he gets breakfast out, he gets oatmeal from [employer] L: chicken salad or deli [place] on whole wheat bread D: chicken, pork (~1x/week), chooses lean ground meat (~1x/week), fish including salmon. He enjoys potatoes sometimes with his meals as well as non-starchy vegetbles such as broccoli. S: peanut butter - sometimes with fruit. Tommy Rowe reports feeling hungry towards the end of the day - discussed including some snacks such as yogurt with nuts and fruit or peanut butter with fruit to eat during the day in-between meals. Tommy Rowe reports not salting any of his food and limiting eating out to no more than 2-3x/week. He will cook with olive oil, but does use some butter with a potato or on toast sparingly. Discussed MyPlate and heart healthy eating.      Intervention Plan   Intervention Prescribe, educate and counsel regarding individualized specific dietary modifications aiming towards targeted core components such as weight, hypertension, lipid management, diabetes, heart failure and other comorbidities.;Nutrition handout(s) given to patient.    Expected Outcomes Short Term Goal: Understand basic principles of dietary content, such as calories, fat, sodium, cholesterol and nutrients.;Short Term Goal: A  plan has been developed with personal nutrition goals set during dietitian appointment.;Long Term Goal: Adherence to prescribed nutrition plan.             Nutrition Assessments:  MEDIFICTS Score Key: ?70 Need to make dietary changes  40-70 Heart Healthy Diet ? 40 Therapeutic Level Cholesterol Diet  Flowsheet Row Cardiac Rehab from 03/12/2022 in [facility] Cardiac and Pulmonary Rehab  Picture Your Plate Total Score on Admission 80      Picture Your Plate Scores: [id number] Unhealthy dietary pattern with much room for improvement. 41-50 Dietary pattern unlikely to meet recommendations for good health and room for improvement. 51-60 More healthful dietary pattern, with some room  for improvement.  >60 Healthy dietary pattern, although there may be some specific behaviors that could be improved.    Nutrition Goals Re-Evaluation:   Nutrition Goals Discharge (Final Nutrition Goals Re-Evaluation):   Psychosocial: Target Goals: Acknowledge presence or absence of significant depression and/or stress, maximize coping skills, provide positive support system. Participant is able to verbalize types and ability to use techniques and skills needed for reducing stress and depression.   Education: Stress, Anxiety, and Depression - Group verbal and visual presentation to define topics covered.  Reviews how body is impacted by stress, anxiety, and depression.  Also discusses healthy ways to reduce stress and to treat/manage anxiety and depression.  Written material given at graduation.   Education: Sleep Hygiene -Provides group verbal and written instruction about how sleep can affect your health.  Define sleep hygiene, discuss sleep cycles and impact of sleep habits. Review good sleep hygiene tips.    Initial Review & Psychosocial Screening:  Initial Psych Review & Screening - 02/14/22 0920       Initial Review   Current issues with Current Psychotropic Meds;Current Anxiety/Panic   lexapro is for anxiety, been taking for years. It continues to help.     Family Dynamics   Good Support System? Yes   wife, daughter grandkids,  step kids     Barriers   Psychosocial barriers to participate in program There are no identifiable barriers or psychosocial needs.      Screening Interventions   Interventions Encouraged to exercise;To provide support and resources with identified psychosocial needs;Provide feedback about the scores to participant    Expected Outcomes Short Term goal: Utilizing psychosocial counselor, staff and physician to assist with identification of specific Stressors or current issues interfering with healing process. Setting desired goal for each stressor or  current issue identified.;Long Term Goal: Stressors or current issues are controlled or eliminated.;Short Term goal: Identification and review with participant of any Quality of Life or Depression concerns found by scoring the questionnaire.;Long Term goal: The participant improves quality of Life and PHQ9 Scores as seen by post scores and/or verbalization of changes             Quality of Life Scores:   Quality of Life - 03/12/22 1502       Quality of Life   Select Quality of Life      Quality of Life Scores   Health/Function Pre 28 %    Socioeconomic Pre 23.94 %    Psych/Spiritual Pre 30 %    Family Pre 28.8 %    GLOBAL Pre 27.59 %            Scores of 19 and below usually indicate a poorer quality of life in these areas.  A difference of  2-3 points is a clinically meaningful difference.  A  difference of 2-3 points in the total score of the Quality of Life Index has been associated with significant improvement in overall quality of life, self-image, physical symptoms, and general health in studies assessing change in quality of life.  PHQ-9: Review Flowsheet       03/12/2022  Depression screen PHQ 2/9  Decreased Interest 0  Down, Depressed, Hopeless 0  PHQ - 2 Score 0  Altered sleeping 1  Tired, decreased energy 0  Change in appetite 1  Feeling bad or failure about yourself  0  Trouble concentrating 0  Moving slowly or fidgety/restless 0  Suicidal thoughts 0  PHQ-9 Score 2  Difficult doing work/chores Not difficult at all   Interpretation of Total Score  Total Score Depression Severity:  1-4 = Minimal depression, 5-9 = Mild depression, 10-14 = Moderate depression, 15-19 = Moderately severe depression, 20-27 = Severe depression   Psychosocial Evaluation and Intervention:  Psychosocial Evaluation - 02/14/22 0943       Psychosocial Evaluation & Interventions   Interventions Encouraged to exercise with the program and follow exercise prescription    Comments  Tommy Rowe has no barriers to attending the prgoram. He is ready to learn how to manage his heart disease and live as long as he can. He has a history of anxiety and is on Lexapro. He stated that this helps. He has changed jobs and his anxiety is not as bad now. His health care team has kept him on Lexapro"if it is working,don't change it".  Tommy Rowe lives with his wife. She and his daughter, grandchildren and step children are his support.  He is the support for his daughter and grandchildren. He is ready to get started with the program.    Expected Outcomes STG Tommy Rowe attends all scheduled sessions, he continues to keep anxiety levels low. LTG Tommy Rowe continues his exercise progression and continues to manage any anxiety symptoms after discharge from the program.    Continue Psychosocial Services  Follow up required by staff             Psychosocial Re-Evaluation:   Psychosocial Discharge (Final Psychosocial Re-Evaluation):   Vocational Rehabilitation: Provide vocational rehab assistance to qualifying candidates.   Vocational Rehab Evaluation & Intervention:   Education: Education Goals: Education classes will be provided on a variety of topics geared toward better understanding of heart health and risk factor modification. Participant will state understanding/return demonstration of topics presented as noted by education test scores.  Learning Barriers/Preferences:   General Cardiac Education Topics:  AED/CPR: - Group verbal and written instruction with the use of models to demonstrate the basic use of the AED with the basic ABC's of resuscitation.   Anatomy and Cardiac Procedures: - Group verbal and visual presentation and models provide information about basic cardiac anatomy and function. Reviews the testing methods done to diagnose heart disease and the outcomes of the test results. Describes the treatment choices: Medical Management, Angioplasty, or Coronary Bypass Surgery for treating  various heart conditions including Myocardial Infarction, Angina, Valve Disease, and Cardiac Arrhythmias.  Written material given at graduation.   Medication Safety: - Group verbal and visual instruction to review commonly prescribed medications for heart and lung disease. Reviews the medication, class of the drug, and side effects. Includes the steps to properly store meds and maintain the prescription regimen.  Written material given at graduation. Flowsheet Row Cardiac Rehab from 03/22/2022 in [facility] Cardiac and Pulmonary Rehab  Date 03/15/22  Educator SB  Instruction Review Code 1- [employer]  Understanding       Intimacy: - Group verbal instruction through game format to discuss how heart and lung disease can affect sexual intimacy. Written material given at graduation..   Know Your Numbers and Heart Failure: - Group verbal and visual instruction to discuss disease risk factors for cardiac and pulmonary disease and treatment options.  Reviews associated critical values for Overweight/Obesity, Hypertension, Cholesterol, and Diabetes.  Discusses basics of heart failure: signs/symptoms and treatments.  Introduces Heart Failure Zone chart for action plan for heart failure.  Written material given at graduation. Flowsheet Row Cardiac Rehab from 03/22/2022 in [facility] Cardiac and Pulmonary Rehab  Education need identified 03/12/22  Date 03/22/22  Educator SB  Instruction Review Code 1- Verbalizes Understanding       Infection Prevention: - Provides verbal and written material to individual with discussion of infection control including proper hand washing and proper equipment cleaning during exercise session. Flowsheet Row Cardiac Rehab from 03/22/2022 in [facility] Cardiac and Pulmonary Rehab  Date 03/12/22  Educator [facility]  Instruction Review Code 1- Verbalizes Understanding       Falls Prevention: - Provides verbal and written material to individual with discussion of falls prevention and  safety. Flowsheet Row Cardiac Rehab from 03/22/2022 in [facility] Cardiac and Pulmonary Rehab  Date 02/14/22  Educator SB  Instruction Review Code 1- Verbalizes Understanding       Other: -Provides group and verbal instruction on various topics (see comments)   Knowledge Questionnaire Score:  Knowledge Questionnaire Score - 03/12/22 1503       Knowledge Questionnaire Score   Pre Score 22/26             Core Components/Risk Factors/Patient Goals at Admission:  Personal Goals and Risk Factors at Admission - 03/12/22 1503       Core Components/Risk Factors/Patient Goals on Admission    Weight Management Yes;Weight Loss    Intervention Weight Management: Develop a combined nutrition and exercise program designed to reach desired caloric intake, while maintaining appropriate intake of nutrient and fiber, sodium and fats, and appropriate energy expenditure required for the weight goal.;Weight Management/Obesity: Establish reasonable short term and long term weight goals.;Weight Management: Provide education and appropriate resources to help participant work on and attain dietary goals.    Admit Weight 207 lb 12.8 oz (94.3 kg)    Goal Weight: Short Term 203 lb (92.1 kg)    Goal Weight: Long Term 195 lb (88.5 kg)    Expected Outcomes Short Term: Continue to assess and modify interventions until short term weight is achieved;Long Term: Adherence to nutrition and physical activity/exercise program aimed toward attainment of established weight goal;Weight Loss: Understanding of general recommendations for a balanced deficit meal plan, which promotes 1-2 lb weight loss per week and includes a negative energy balance of [phone] kcal/d;Understanding recommendations for meals to include 15-35% energy as protein, 25-35% energy from fat, 35-60% energy from carbohydrates, less than 200mg  of dietary cholesterol, 20-35 gm of total fiber daily;Understanding of distribution of calorie intake throughout the  day with the consumption of 4-5 meals/snacks    Hypertension Yes    Intervention Provide education on lifestyle modifcations including regular physical activity/exercise, weight management, moderate sodium restriction and increased consumption of fresh fruit, vegetables, and low fat dairy, alcohol moderation, and smoking cessation.;Monitor prescription use compliance.    Expected Outcomes Short Term: Continued assessment and intervention until BP is < 140/86mm HG in hypertensive participants. < 130/24mm HG in hypertensive participants with diabetes, heart failure or chronic  kidney disease.;Long Term: Maintenance of blood pressure at goal levels.    Lipids Yes    Intervention Provide education and support for participant on nutrition & aerobic/resistive exercise along with prescribed medications to achieve LDL 70mg , HDL >40mg .    Expected Outcomes Short Term: Participant states understanding of desired cholesterol values and is compliant with medications prescribed. Participant is following exercise prescription and nutrition guidelines.;Long Term: Cholesterol controlled with medications as prescribed, with individualized exercise RX and with personalized nutrition plan. Value goals: LDL < 70mg , HDL > 40 mg.             Education:Diabetes - Individual verbal and written instruction to review signs/symptoms of diabetes, desired ranges of glucose level fasting, after meals and with exercise. Acknowledge that pre and post exercise glucose checks will be done for 3 sessions at entry of program.   Core Components/Risk Factors/Patient Goals Review:    Core Components/Risk Factors/Patient Goals at Discharge (Final Review):    ITP Comments:  ITP Comments     Row Name 02/14/22 0941 03/12/22 1451 03/15/22 1001 03/28/22 0943     ITP Comments Virtual orientation call completed today. he has an appointment on Date: 02/26/2022  for EP eval and gym Orientation.  Documentation of diagnosis can be found  in [facility] Date: 09/17/2021 .       Tommy Rowe took time researching Cardiac Rehab before committing to the program. Completed 6MWT and gym orientation. Initial ITP created and sent for review to Dr. Emily Filbert, Medical Director. First full day of exercise!  Patient was oriented to gym and equipment including functions, settings, policies, and procedures.  Patient's individual exercise prescription and treatment plan were reviewed.  All starting workloads were established based on the results of the 6 minute walk test done at initial orientation visit.  The plan for exercise progression was also introduced and progression will be customized based on patient's performance and goals. 30 Day review completed. Medical Director ITP review done, changes made as directed, and signed approval by Medical Director.    new to program             Comments:

## 2022-04-03 ENCOUNTER — Encounter: Payer: Medicare Other | Attending: Internal Medicine | Admitting: *Deleted

## 2022-04-03 ENCOUNTER — Telehealth: Payer: Self-pay | Admitting: Urology

## 2022-04-03 DIAGNOSIS — I252 Old myocardial infarction: Secondary | ICD-10-CM | POA: Insufficient documentation

## 2022-04-03 DIAGNOSIS — Z48812 Encounter for surgical aftercare following surgery on the circulatory system: Secondary | ICD-10-CM | POA: Insufficient documentation

## 2022-04-03 DIAGNOSIS — Z955 Presence of coronary angioplasty implant and graft: Secondary | ICD-10-CM | POA: Diagnosis present

## 2022-04-03 DIAGNOSIS — I214 Non-ST elevation (NSTEMI) myocardial infarction: Secondary | ICD-10-CM

## 2022-04-03 NOTE — Telephone Encounter (Signed)
-----   Message from Billey Co, MD sent at 04/03/2022 12:30 PM EST ----- Patient was supposed to follow-up for cystoscopy and review CT results, but he canceled cystoscopy.  There is a stone in the bladder likely from incomplete emptying.  Recommend follow-up in person or virtual to discuss next steps  Nickolas Madrid, MD 04/03/2022

## 2022-04-03 NOTE — Telephone Encounter (Signed)
Pt stopped by.  He canceled his cysto appt.  Would like to  know the results of the CT scan.  Please call [phone]

## 2022-04-03 NOTE — Telephone Encounter (Signed)
Called pt informed him of the information below. Pt voiced understanding. Telephone visit scheduled.

## 2022-04-03 NOTE — Progress Notes (Signed)
Daily Session Note  Patient Details  Name: Tommy Rowe MRN: [id number] Date of Birth: 06/14/1955 Referring Provider:   Flowsheet Row Cardiac Rehab from 03/12/2022 in [facility] Cardiac and Pulmonary Rehab  Referring Provider Callwood, Dwayne MD       Encounter Date: 04/03/2022  Check In:  Session Check In - 04/03/22 1035       Check-In   Supervising physician immediately available to respond to emergencies See telemetry face sheet for immediately available ER MD    Location [facility]-Cardiac & Pulmonary Rehab    Staff Present Susanne Bice, RN, BSN, CCRP;Kara Williamson, MS, ACSM CEP, Exercise Physiologist;Noah Tickle, BS, Exercise Physiologist    Virtual Visit No    Medication changes reported     No    Fall or balance concerns reported    No    Warm-up and Cool-down Performed on first and last piece of equipment    Resistance Training Performed Yes    VAD Patient? No    PAD/SET Patient? No      Pain Assessment   Currently in Pain? No/denies                Social History   Tobacco Use  Smoking Status Former   Types: Cigarettes   Quit date: 04/02/1988   Years since quitting: 34.0   Passive exposure: Past  Smokeless Tobacco Never    Goals Met:  Independence with exercise equipment Exercise tolerated well No report of concerns or symptoms today  Goals Unmet:  Not Applicable  Comments: Pt able to follow exercise prescription today without complaint.  Will continue to monitor for progression.    Dr. Mark Miller is Medical Director for [facility].  Dr. Fuad Aleskerov is Medical Director for [facility] Pulmonary [facility]. 

## 2022-04-03 NOTE — Telephone Encounter (Signed)
Did the patient state why he canceled cysto appointment?

## 2022-04-03 NOTE — Telephone Encounter (Signed)
Pt cancelled the Cysto appt because he said he was not having any issues.

## 2022-04-04 ENCOUNTER — Ambulatory Visit (INDEPENDENT_AMBULATORY_CARE_PROVIDER_SITE_OTHER): Payer: Medicare Other | Admitting: Urology

## 2022-04-04 DIAGNOSIS — N210 Calculus in bladder: Secondary | ICD-10-CM

## 2022-04-04 DIAGNOSIS — R310 Gross hematuria: Secondary | ICD-10-CM | POA: Diagnosis not present

## 2022-04-04 DIAGNOSIS — N200 Calculus of kidney: Secondary | ICD-10-CM

## 2022-04-04 NOTE — Progress Notes (Signed)
Virtual Visit via Telephone Note  I connected with Tommy Rowe on 04/04/22 at 11:30 AM EST by telephone and verified that I am speaking with the correct person using two identifiers.   Patient location: Home Provider location: [facility] Urologic Office   I discussed the limitations, risks, security and privacy concerns of performing an evaluation and management service by telephone and the availability of in person appointments. We discussed the impact of the COVID-19 pandemic on the healthcare system, and the importance of social distancing and reducing patient and provider exposure. I also discussed with the patient that there may be a patient responsible charge related to this service. The patient expressed understanding and agreed to proceed.  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  He passed a voiding trial postoperatively.  CT urogram showed a 7 mm bladder stone, enlarged prostate, but no other urologic abnormalities.  He no showed his cystoscopy visit.  He denies any urinary symptoms or recurrent gross hematuria.  I reviewed his CT findings today of a bladder stone.  We discussed that we cannot rule out a bladder tumor without clinic cystoscopy.  We discussed this could be a kidney stone that passed into the bladder and he will void out spontaneously, versus a component of incomplete emptying with a bladder stone.  He would like to still hold off on cystoscopy until he can get off blood thinners from his cardiac stent placement.  Risks were discussed extensively.  He was amenable to following up in 4 months for a KUB to see if the stone has passed, check a PVR, and reconsider cystoscopy.   Follow Up: Return precautions discussed extensively RTC 3 to 4  months KUB to evaluate bladder stone passage, PVR   I discussed the assessment and treatment plan with the patient. The patient was provided an opportunity to ask questions and all were answered. The patient agreed with the plan and demonstrated an understanding of the instructions.   The patient was advised to call back or seek an in-person evaluation if the symptoms worsen or if the condition fails to improve as anticipated.  I provided 7 minutes of non-face-to-face time during this encounter.   Billey Co, MD

## 2022-04-05 ENCOUNTER — Encounter: Payer: Medicare Other | Admitting: *Deleted

## 2022-04-05 ENCOUNTER — Other Ambulatory Visit: Payer: Medicare Other | Admitting: Urology

## 2022-04-05 DIAGNOSIS — I252 Old myocardial infarction: Secondary | ICD-10-CM | POA: Diagnosis not present

## 2022-04-05 DIAGNOSIS — I214 Non-ST elevation (NSTEMI) myocardial infarction: Secondary | ICD-10-CM

## 2022-04-05 DIAGNOSIS — Z955 Presence of coronary angioplasty implant and graft: Secondary | ICD-10-CM

## 2022-04-05 NOTE — Progress Notes (Signed)
Daily Session Note  Patient Details  Name: SAILOR HAUGHN MRN: [id number] Date of Birth: [birth year] Referring Provider:   Flowsheet Row Cardiac Rehab from 03/12/2022 in [facility] Cardiac and Pulmonary Rehab  Referring Provider Lujean Amel MD       Encounter Date: 04/05/2022  Check In:  Session Check In - 04/05/22 1016       Check-In   Supervising physician immediately available to respond to emergencies See telemetry face sheet for immediately available ER MD    Location [facility]-Cardiac & Pulmonary Rehab    Staff Present Darlyne Russian, RN, Lorin Mercy, MS, ACSM CEP, Exercise Physiologist;Joseph Tessie Fass, [place]    Virtual Visit No    Medication changes reported     No    Fall or balance concerns reported    No    Warm-up and Cool-down Performed on first and last piece of equipment    Resistance Training Performed Yes    VAD Patient? No    PAD/SET Patient? No      Pain Assessment   Currently in Pain? No/denies                Social History   Tobacco Use  Smoking Status Former   Types: Cigarettes   Quit date: 04/02/1988   Years since quitting: 34.0   Passive exposure: Past  Smokeless Tobacco Never    Goals Met:  Independence with exercise equipment Exercise tolerated well No report of concerns or symptoms today Strength training completed today  Goals Unmet:  Not Applicable  Comments: Pt able to follow exercise prescription today without complaint.  Will continue to monitor for progression.    Dr. Emily Filbert is Medical Director for [place].  Dr. Ottie Glazier is Medical Director for [facility] Pulmonary Rehabilitation.

## 2022-04-06 ENCOUNTER — Encounter: Payer: Medicare Other | Admitting: *Deleted

## 2022-04-06 DIAGNOSIS — I214 Non-ST elevation (NSTEMI) myocardial infarction: Secondary | ICD-10-CM

## 2022-04-06 DIAGNOSIS — I252 Old myocardial infarction: Secondary | ICD-10-CM | POA: Diagnosis not present

## 2022-04-06 DIAGNOSIS — Z955 Presence of coronary angioplasty implant and graft: Secondary | ICD-10-CM

## 2022-04-06 NOTE — Progress Notes (Signed)
Daily Session Note  Patient Details  Name: Tommy Rowe MRN: [id number] Date of Birth: 1955/11/08 Referring Provider:   Flowsheet Row Cardiac Rehab from 03/12/2022 in [facility] Cardiac and Pulmonary Rehab  Referring Provider Lujean Amel MD       Encounter Date: 04/06/2022  Check In:  Session Check In - 04/06/22 1048       Check-In   Supervising physician immediately available to respond to emergencies See telemetry face sheet for immediately available ER MD    Location [facility]-Cardiac & Pulmonary Rehab    Staff Present Heath Lark, RN, BSN, CCRP;Jessica Turner, MA, RCEP, CCRP, CCET;Joseph [place], [place]    Virtual Visit No    Medication changes reported     No    Fall or balance concerns reported    No    Warm-up and Cool-down Performed on first and last piece of equipment    Resistance Training Performed Yes    VAD Patient? No    PAD/SET Patient? No      Pain Assessment   Currently in Pain? No/denies                Social History   Tobacco Use  Smoking Status Former   Types: Cigarettes   Quit date: 04/02/1988   Years since quitting: 34.0   Passive exposure: Past  Smokeless Tobacco Never    Goals Met:  Independence with exercise equipment Exercise tolerated well No report of concerns or symptoms today  Goals Unmet:  Not Applicable  Comments: Pt able to follow exercise prescription today without complaint.  Will continue to monitor for progression.    Dr. Emily Filbert is Medical Director for [place].  Dr. Ottie Glazier is Medical Director for [facility] Pulmonary Rehabilitation.

## 2022-04-10 ENCOUNTER — Encounter: Payer: Medicare Other | Admitting: *Deleted

## 2022-04-10 DIAGNOSIS — I214 Non-ST elevation (NSTEMI) myocardial infarction: Secondary | ICD-10-CM

## 2022-04-10 DIAGNOSIS — Z955 Presence of coronary angioplasty implant and graft: Secondary | ICD-10-CM

## 2022-04-10 DIAGNOSIS — I252 Old myocardial infarction: Secondary | ICD-10-CM | POA: Diagnosis not present

## 2022-04-10 NOTE — Progress Notes (Signed)
Daily Session Note  Patient Details  Name: Tommy Rowe MRN: [id number] Date of Birth: May 02, 1955 Referring Provider:   Flowsheet Row Cardiac Rehab from 03/12/2022 in [facility] Cardiac and Pulmonary Rehab  Referring Provider Lujean Amel MD       Encounter Date: 04/10/2022  Check In:  Session Check In - 04/10/22 1039       Check-In   Supervising physician immediately available to respond to emergencies See telemetry face sheet for immediately available ER MD    Location [facility]-Cardiac & Pulmonary Rehab    Staff Present Heath Lark, RN, BSN, CCRP;Jessica McKenna, MA, [place], CCRP, Bertram Gala, MS, ACSM CEP, Exercise Physiologist;Meredith Sherryll Burger, RN BSN    Virtual Visit No    Medication changes reported     No    Fall or balance concerns reported    No    Warm-up and Cool-down Performed on first and last piece of equipment    Resistance Training Performed Yes    VAD Patient? No    PAD/SET Patient? No      Pain Assessment   Currently in Pain? No/denies                Social History   Tobacco Use  Smoking Status Former   Types: Cigarettes   Quit date: 04/02/1988   Years since quitting: 34.0   Passive exposure: Past  Smokeless Tobacco Never    Goals Met:  Independence with exercise equipment Exercise tolerated well No report of concerns or symptoms today  Goals Unmet:  Not Applicable  Comments: Pt able to follow exercise prescription today without complaint.  Will continue to monitor for progression.    Dr. Emily Filbert is Medical Director for [place].  Dr. Ottie Glazier is Medical Director for [facility] Pulmonary Rehabilitation.

## 2022-04-12 ENCOUNTER — Encounter: Payer: Medicare Other | Admitting: *Deleted

## 2022-04-12 DIAGNOSIS — I252 Old myocardial infarction: Secondary | ICD-10-CM | POA: Diagnosis not present

## 2022-04-12 DIAGNOSIS — I214 Non-ST elevation (NSTEMI) myocardial infarction: Secondary | ICD-10-CM

## 2022-04-12 DIAGNOSIS — Z955 Presence of coronary angioplasty implant and graft: Secondary | ICD-10-CM

## 2022-04-12 NOTE — Progress Notes (Signed)
Daily Session Note  Patient Details  Name: SPIROS GREENFELD MRN: [id number] Date of Birth: Jan 03, 1956 Referring Provider:   Flowsheet Row Cardiac Rehab from 03/12/2022 in [facility] Cardiac and Pulmonary Rehab  Referring Provider Lujean Amel MD       Encounter Date: 04/12/2022  Check In:  Session Check In - 04/12/22 1005       Check-In   Supervising physician immediately available to respond to emergencies See telemetry face sheet for immediately available ER MD    Location [facility]-Cardiac & Pulmonary Rehab    Staff Present Darlyne Russian, RN, Lorin Mercy, MS, ACSM CEP, Exercise Physiologist;Joseph Tessie Fass, [place]    Virtual Visit No    Medication changes reported     No    Fall or balance concerns reported    No    Warm-up and Cool-down Performed on first and last piece of equipment    Resistance Training Performed Yes    VAD Patient? No    PAD/SET Patient? No      Pain Assessment   Currently in Pain? No/denies                Social History   Tobacco Use  Smoking Status Former   Types: Cigarettes   Quit date: 04/02/1988   Years since quitting: 34.0   Passive exposure: Past  Smokeless Tobacco Never    Goals Met:  Independence with exercise equipment Exercise tolerated well No report of concerns or symptoms today Strength training completed today  Goals Unmet:  Not Applicable  Comments: Pt able to follow exercise prescription today without complaint.  Will continue to monitor for progression.    Dr. Emily Filbert is Medical Director for [place].  Dr. Ottie Glazier is Medical Director for [facility] Pulmonary Rehabilitation.

## 2022-04-13 ENCOUNTER — Encounter: Payer: Medicare Other | Admitting: *Deleted

## 2022-04-13 DIAGNOSIS — I214 Non-ST elevation (NSTEMI) myocardial infarction: Secondary | ICD-10-CM

## 2022-04-13 DIAGNOSIS — I252 Old myocardial infarction: Secondary | ICD-10-CM | POA: Diagnosis not present

## 2022-04-13 DIAGNOSIS — Z955 Presence of coronary angioplasty implant and graft: Secondary | ICD-10-CM

## 2022-04-13 NOTE — Progress Notes (Signed)
Daily Session Note  Patient Details  Name: Tommy Rowe MRN: [id number] Date of Birth: 06-Apr-1955 Referring Provider:   Flowsheet Row Cardiac Rehab from 03/12/2022 in [facility] Cardiac and Pulmonary Rehab  Referring Provider Lujean Amel MD       Encounter Date: 04/13/2022  Check In:  Session Check In - 04/13/22 1042       Check-In   Supervising physician immediately available to respond to emergencies See telemetry face sheet for immediately available ER MD    Location [facility]-Cardiac & Pulmonary Rehab    Staff Present Heath Lark, RN, BSN, CCRP;Jessica [place], MA, RCEP, CCRP, CCET;Joseph [place], [place]    Virtual Visit No    Medication changes reported     No    Fall or balance concerns reported    No    Warm-up and Cool-down Performed on first and last piece of equipment    Resistance Training Performed Yes    VAD Patient? No    PAD/SET Patient? No      Pain Assessment   Currently in Pain? No/denies                Social History   Tobacco Use  Smoking Status Former   Types: Cigarettes   Quit date: 04/02/1988   Years since quitting: 34.0   Passive exposure: Past  Smokeless Tobacco Never    Goals Met:  Independence with exercise equipment Exercise tolerated well No report of concerns or symptoms today  Goals Unmet:  Not Applicable  Comments: Pt able to follow exercise prescription today without complaint.  Will continue to monitor for progression.    Dr. Emily Filbert is Medical Director for [place].  Dr. Ottie Glazier is Medical Director for [facility] Pulmonary Rehabilitation.

## 2022-04-17 ENCOUNTER — Encounter: Payer: Medicare Other | Admitting: *Deleted

## 2022-04-17 DIAGNOSIS — Z955 Presence of coronary angioplasty implant and graft: Secondary | ICD-10-CM

## 2022-04-17 DIAGNOSIS — I214 Non-ST elevation (NSTEMI) myocardial infarction: Secondary | ICD-10-CM

## 2022-04-17 DIAGNOSIS — I252 Old myocardial infarction: Secondary | ICD-10-CM | POA: Diagnosis not present

## 2022-04-17 NOTE — Progress Notes (Signed)
Daily Session Note  Patient Details  Name: CHESNEY KLIMASZEWSKI MRN: [id number] Date of Birth: 1956-03-27 Referring Provider:   Flowsheet Row Cardiac Rehab from 03/12/2022 in [facility] Cardiac and Pulmonary Rehab  Referring Provider Lujean Amel MD       Encounter Date: 04/17/2022  Check In:  Session Check In - 04/17/22 1035       Check-In   Supervising physician immediately available to respond to emergencies See telemetry face sheet for immediately available ER MD    Location [facility]-Cardiac & Pulmonary Rehab    Staff Present Renita Papa, RN Abel Presto, MS, ACSM CEP, Exercise Physiologist;Jessica Luan [place], MA, RCEP, CCRP, CCET    Virtual Visit No    Medication changes reported     No    Fall or balance concerns reported    No    Warm-up and Cool-down Performed on first and last piece of equipment    Resistance Training Performed Yes    VAD Patient? No    PAD/SET Patient? No      Pain Assessment   Currently in Pain? No/denies                Social History   Tobacco Use  Smoking Status Former   Types: Cigarettes   Quit date: 04/02/1988   Years since quitting: 34.0   Passive exposure: Past  Smokeless Tobacco Never    Goals Met:  Independence with exercise equipment Exercise tolerated well No report of concerns or symptoms today Strength training completed today  Goals Unmet:  Not Applicable  Comments: Pt able to follow exercise prescription today without complaint.  Will continue to monitor for progression.    Dr. Emily Filbert is Medical Director for [place].  Dr. Ottie Glazier is Medical Director for [facility] Pulmonary Rehabilitation.

## 2022-04-20 ENCOUNTER — Encounter: Payer: Self-pay | Admitting: Urology

## 2022-04-20 ENCOUNTER — Encounter: Payer: Medicare Other | Admitting: *Deleted

## 2022-04-20 DIAGNOSIS — Z955 Presence of coronary angioplasty implant and graft: Secondary | ICD-10-CM

## 2022-04-20 DIAGNOSIS — I252 Old myocardial infarction: Secondary | ICD-10-CM | POA: Diagnosis not present

## 2022-04-20 DIAGNOSIS — I214 Non-ST elevation (NSTEMI) myocardial infarction: Secondary | ICD-10-CM

## 2022-04-20 NOTE — Progress Notes (Signed)
Daily Session Note  Patient Details  Name: Tommy Rowe MRN: [id number] Date of Birth: July 11, 1955 Referring Provider:   Flowsheet Row Cardiac Rehab from 03/12/2022 in [facility] Cardiac and Pulmonary Rehab  Referring Provider Lujean Amel MD       Encounter Date: 04/20/2022  Check In:  Session Check In - 04/20/22 1037       Check-In   Supervising physician immediately available to respond to emergencies See telemetry face sheet for immediately available ER MD    Location [facility]-Cardiac & Pulmonary Rehab    Staff Present Heath Lark, RN, BSN, CCRP;Marvell Fuller, PhD, RN, CNS, CEN;Joseph Tessie Fass, [place]    Virtual Visit No    Medication changes reported     No    Fall or balance concerns reported    No    Warm-up and Cool-down Performed on first and last piece of equipment    Resistance Training Performed Yes    VAD Patient? No    PAD/SET Patient? No      Pain Assessment   Currently in Pain? No/denies                Social History   Tobacco Use  Smoking Status Former   Types: Cigarettes   Quit date: 04/02/1988   Years since quitting: 34.0   Passive exposure: Past  Smokeless Tobacco Never    Goals Met:  Independence with exercise equipment Exercise tolerated well No report of concerns or symptoms today  Goals Unmet:  Not Applicable  Comments: Pt able to follow exercise prescription today without complaint.  Will continue to monitor for progression.    Dr. Emily Filbert is Medical Director for [place].  Dr. Ottie Glazier is Medical Director for [facility] Pulmonary Rehabilitation.

## 2022-04-24 ENCOUNTER — Encounter: Payer: Medicare Other | Admitting: *Deleted

## 2022-04-24 DIAGNOSIS — I252 Old myocardial infarction: Secondary | ICD-10-CM | POA: Diagnosis not present

## 2022-04-24 DIAGNOSIS — I214 Non-ST elevation (NSTEMI) myocardial infarction: Secondary | ICD-10-CM

## 2022-04-24 DIAGNOSIS — Z955 Presence of coronary angioplasty implant and graft: Secondary | ICD-10-CM

## 2022-04-24 NOTE — Progress Notes (Signed)
Daily Session Note  Patient Details  Name: Tommy Rowe MRN: [id number] Date of Birth: 18-Jul-1955 Referring Provider:   Flowsheet Row Cardiac Rehab from 03/12/2022 in [facility] Cardiac and Pulmonary Rehab  Referring Provider Lujean Amel MD       Encounter Date: 04/24/2022  Check In:  Session Check In - 04/24/22 1036       Check-In   Supervising physician immediately available to respond to emergencies See telemetry face sheet for immediately available ER MD    Location [facility]-Cardiac & Pulmonary Rehab    Staff Present Heath Lark, RN, BSN, CCRP;Laureen Owens Shark, BS, RRT, CPFT;Kara Maricela Bo, MS, ACSM CEP, Exercise Physiologist;Noah Tickle, BS, Exercise Physiologist    Virtual Visit No    Medication changes reported     No    Fall or balance concerns reported    No    Warm-up and Cool-down Performed on first and last piece of equipment    Resistance Training Performed Yes    VAD Patient? No    PAD/SET Patient? No      Pain Assessment   Currently in Pain? No/denies                Social History   Tobacco Use  Smoking Status Former   Types: Cigarettes   Quit date: 04/02/1988   Years since quitting: 34.0   Passive exposure: Past  Smokeless Tobacco Never    Goals Met:  Independence with exercise equipment Exercise tolerated well No report of concerns or symptoms today  Goals Unmet:  Not Applicable  Comments: Pt able to follow exercise prescription today without complaint.  Will continue to monitor for progression.    Dr. Emily Filbert is Medical Director for [place].  Dr. Ottie Glazier is Medical Director for [facility] Pulmonary Rehabilitation.

## 2022-04-25 ENCOUNTER — Encounter: Payer: Self-pay | Admitting: *Deleted

## 2022-04-25 DIAGNOSIS — Z955 Presence of coronary angioplasty implant and graft: Secondary | ICD-10-CM

## 2022-04-25 DIAGNOSIS — I214 Non-ST elevation (NSTEMI) myocardial infarction: Secondary | ICD-10-CM

## 2022-04-25 NOTE — Progress Notes (Signed)
Cardiac Individual Treatment Plan  Patient Details  Name: SIE SMELLIE MRN: [id number] Date of Birth: [birth year] Referring Provider:   Flowsheet Row Cardiac Rehab from 03/12/2022 in [facility] Cardiac and Pulmonary Rehab  Referring Provider Lujean Amel MD       Initial Encounter Date:  Flowsheet Row Cardiac Rehab from 03/12/2022 in [facility] Cardiac and Pulmonary Rehab  Date 03/12/22       Visit Diagnosis: NSTEMI (non-ST elevation myocardial infarction) [facility])  Status post coronary artery stent placement  Patient's Home Medications on Admission:  Current Outpatient Medications:    amiodarone (PACERONE) 100 MG tablet, Take 100 mg by mouth daily., Disp: , Rfl:    ELIQUIS 5 MG TABS tablet, Take 5 mg by mouth 2 (two) times daily., Disp: , Rfl:    escitalopram (LEXAPRO) 20 MG tablet, Take 20 mg by mouth at bedtime., Disp: , Rfl: 1   losartan-hydrochlorothiazide (HYZAAR) 100-25 MG tablet, Take 1 tablet by mouth daily., Disp: , Rfl:    Multiple Vitamin (MULTIVITAMIN WITH MINERALS) TABS tablet, Take 1 tablet by mouth daily. Centrum Silver for Men 50+, Disp: , Rfl:    nitroGLYCERIN (NITROSTAT) 0.4 MG SL tablet, Place 1 tablet (0.4 mg total) under the tongue every 5 (five) minutes as needed for chest pain., Disp: 1 tablet, Rfl: 12   rosuvastatin (CRESTOR) 20 MG tablet, Take 20 mg by mouth at bedtime., Disp: , Rfl:    triamcinolone cream (KENALOG) 0.1 %, APPLY THIN LAYER TOPICALLY TO THE AFFECTED AREA TWICE DAILY, Disp: , Rfl:  No current facility-administered medications for this visit.  Facility-Administered Medications Ordered in Other Visits:    sodium chloride flush (NS) 0.9 % injection 3 mL, 3 mL, Intravenous, Q12H, Scoggins, Amber, NP  Past Medical History: Past Medical History:  Diagnosis Date   A-fib ([place])    Anxiety    HLD (hyperlipidemia)    HTN (hypertension)     Tobacco Use: Social History   Tobacco Use  Smoking Status Former   Types: Cigarettes   Quit date:  04/02/1988   Years since quitting: 34.0   Passive exposure: Past  Smokeless Tobacco Never    Labs: Review Flowsheet       Latest Ref Rng & Units 09/17/2021  Labs for ITP Cardiac and Pulmonary Rehab  Cholestrol 0 - 200 mg/dL 109   LDL (calc) 0 - 99 mg/dL 55   HDL-C >40 mg/dL 49   Trlycerides <150 mg/dL 23   Hemoglobin A1c 4.8 - 5.6 % 5.7      Exercise Target Goals: Exercise Program Goal: Individual exercise prescription set using results from initial 6 min walk test and THRR while considering  patient's activity barriers and safety.   Exercise Prescription Goal: Initial exercise prescription builds to 30-45 minutes a day of aerobic activity, 2-3 days per week.  Home exercise guidelines will be given to patient during program as part of exercise prescription that the participant will acknowledge.   Education: Aerobic Exercise: - Group verbal and visual presentation on the components of exercise prescription. Introduces F.I.T.T principle from ACSM for exercise prescriptions.  Reviews F.I.T.T. principles of aerobic exercise including progression. Written material given at graduation. Flowsheet Row Cardiac Rehab from 03/22/2022 in [facility] Cardiac and Pulmonary Rehab  Education need identified 03/12/22       Education: Resistance Exercise: - Group verbal and visual presentation on the components of exercise prescription. Introduces F.I.T.T principle from ACSM for exercise prescriptions  Reviews F.I.T.T. principles of resistance exercise including progression. Written material given  at graduation.    Education: Exercise & Equipment Safety: - Individual verbal instruction and demonstration of equipment use and safety with use of the equipment. Flowsheet Row Cardiac Rehab from 03/22/2022 in [facility] Cardiac and Pulmonary Rehab  Date 03/12/22  Educator [facility]  Instruction Review Code 1- Verbalizes Understanding       Education: Exercise Physiology & General Exercise Guidelines: - Group  verbal and written instruction with models to review the exercise physiology of the cardiovascular system and associated critical values. Provides general exercise guidelines with specific guidelines to those with heart or lung disease.  Flowsheet Row Cardiac Rehab from 03/22/2022 in [facility] Cardiac and Pulmonary Rehab  Education need identified 03/12/22       Education: Flexibility, Balance, Mind/Body Relaxation: - Group verbal and visual presentation with interactive activity on the components of exercise prescription. Introduces F.I.T.T principle from ACSM for exercise prescriptions. Reviews F.I.T.T. principles of flexibility and balance exercise training including progression. Also discusses the mind body connection.  Reviews various relaxation techniques to help reduce and manage stress (i.e. Deep breathing, progressive muscle relaxation, and visualization). Balance handout provided to take home. Written material given at graduation.   Activity Barriers & Risk Stratification:  Activity Barriers & Cardiac Risk Stratification - 03/12/22 1453       Activity Barriers & Cardiac Risk Stratification   Activity Barriers Back Problems;Arthritis   arth back and shoulders   Cardiac Risk Stratification Moderate             6 Minute Walk:  6 Minute Walk     Row Name 03/12/22 1451         6 Minute Walk   Phase Initial     Distance 1600 feet     Walk Time 6 minutes     # of Rest Breaks 0     MPH 3.03     METS 3.87     RPE 12     VO2 Peak 13.57     Symptoms No     Resting HR 63 bpm     Resting BP 122/64     Resting Oxygen Saturation  96 %     Exercise Oxygen Saturation  during 6 min walk 95 %     Max Ex. HR 102 bpm     Max Ex. BP 144/75     2 Minute Post BP 122/66              Oxygen Initial Assessment:   Oxygen Re-Evaluation:   Oxygen Discharge (Final Oxygen Re-Evaluation):   Initial Exercise Prescription:  Initial Exercise Prescription - 03/12/22 1400        Date of Initial Exercise RX and Referring Provider   Date 03/12/22    Referring Provider Lujean Amel MD      Oxygen   Maintain Oxygen Saturation 88% or higher      Treadmill   MPH 2.7    Grade 1    Minutes 15    METs 3.25      Elliptical   Level 1    Speed 2.5    Minutes 15    METs 3      REL-XR   Level 1    Speed 50    Minutes 15    METs 3      Track   Laps 38    Minutes 15    METs 3.07      Prescription Details   Frequency (times per week) 2  Duration Progress to 30 minutes of continuous aerobic without signs/symptoms of physical distress      Intensity   THRR 40-80% of Max Heartrate 100-137    Ratings of Perceived Exertion 11-13    Perceived Dyspnea 0-4      Progression   Progression Continue to progress workloads to maintain intensity without signs/symptoms of physical distress.      Resistance Training   Training Prescription Yes    Weight 7 lb    Reps 10-15             Perform Capillary Blood Glucose checks as needed.  Exercise Prescription Changes:   Exercise Prescription Changes     Row Name 03/12/22 1400 03/20/22 1000 03/27/22 1500 04/09/22 1400 04/23/22 1000     Response to Exercise   Blood Pressure (Admit) 122/64 -- 108/62 118/60 118/72   Blood Pressure (Exercise) 144/74 -- 148/70 120/58 --   Blood Pressure (Exit) 122/66 -- 102/58 122/62 108/64   Heart Rate (Admit) 63 bpm -- 67 bpm 72 bpm 72 bpm   Heart Rate (Exercise) 102 bpm -- 122 bpm 128 bpm 120 bpm   Heart Rate (Exit) 63 bpm -- 82 bpm 77 bpm 77 bpm   Oxygen Saturation (Admit) 96 % -- -- -- --   Oxygen Saturation (Exercise) 95 % -- -- -- --   Rating of Perceived Exertion (Exercise) 12 -- 15 12 13   $ Symptoms none -- none none none   Comments walk test results -- 3rd full day of exercise -- --   Duration -- -- Continue with 30 min of aerobic exercise without signs/symptoms of physical distress. Continue with 30 min of aerobic exercise without signs/symptoms of physical  distress. Continue with 30 min of aerobic exercise without signs/symptoms of physical distress.   Intensity -- -- THRR unchanged THRR unchanged THRR unchanged     Progression   Progression -- -- Continue to progress workloads to maintain intensity without signs/symptoms of physical distress. Continue to progress workloads to maintain intensity without signs/symptoms of physical distress. Continue to progress workloads to maintain intensity without signs/symptoms of physical distress.   Average METs -- -- 3.48 4.25 4.33     Resistance Training   Training Prescription -- -- Yes Yes Yes   Weight -- -- 7 lb 10 lb 12 lb   Reps -- -- 10-15 10-15 10-15     Interval Training   Interval Training -- -- No No No     Treadmill   MPH -- -- 2.7 3 3.2   Grade -- -- 1 2 3   $ Minutes -- -- 15 15 15   $ METs -- -- 3.44 4.12 4.77     NuStep   Level -- -- -- -- 5   Minutes -- -- -- -- 15   METs -- -- -- -- 4.2     Elliptical   Level -- -- 1 -- 1   Speed -- -- 2.5 -- 2.5   Minutes -- -- 15 -- 15   METs -- -- -- -- 3.7     REL-XR   Level -- -- 3 4 7   $ Minutes -- -- 15 15 15   $ METs -- -- 3.5 5.1 5     Home Exercise Plan   Plans to continue exercise at -- Home (comment)  walking, treadmill, elliptical Home (comment)  walking, treadmill, elliptical Home (comment)  walking, treadmill, elliptical Home (comment)  walking, treadmill, elliptical   Frequency -- Add 2 additional days  to program exercise sessions. Add 2 additional days to program exercise sessions. Add 2 additional days to program exercise sessions. Add 2 additional days to program exercise sessions.   Initial Home Exercises Provided -- 03/20/22 03/20/22 03/20/22 03/20/22     Oxygen   Maintain Oxygen Saturation -- -- 88% or higher 88% or higher 88% or higher            Exercise Comments:   Exercise Comments     Row Name 03/15/22 1001           Exercise Comments First full day of exercise!  Patient was oriented to gym and  equipment including functions, settings, policies, and procedures.  Patient's individual exercise prescription and treatment plan were reviewed.  All starting workloads were established based on the results of the 6 minute walk test done at initial orientation visit.  The plan for exercise progression was also introduced and progression will be customized based on patient's performance and goals.                Exercise Goals and Review:   Exercise Goals     Row Name 03/12/22 1501             Exercise Goals   Increase Physical Activity Yes       Intervention Provide advice, education, support and counseling about physical activity/exercise needs.;Develop an individualized exercise prescription for aerobic and resistive training based on initial evaluation findings, risk stratification, comorbidities and participant's personal goals.       Expected Outcomes Long Term: Add in home exercise to make exercise part of routine and to increase amount of physical activity.;Long Term: Exercising regularly at least 3-5 days a week.;Short Term: Attend rehab on a regular basis to increase amount of physical activity.       Increase Strength and Stamina Yes       Intervention Develop an individualized exercise prescription for aerobic and resistive training based on initial evaluation findings, risk stratification, comorbidities and participant's personal goals.;Provide advice, education, support and counseling about physical activity/exercise needs.       Expected Outcomes Short Term: Increase workloads from initial exercise prescription for resistance, speed, and METs.;Long Term: Improve cardiorespiratory fitness, muscular endurance and strength as measured by increased METs and functional capacity ( );Short Term: Perform resistance training exercises routinely during rehab and add in resistance training at home       Able to understand and use rate of perceived exertion (RPE) scale Yes        Intervention Provide education and explanation on how to use RPE scale       Expected Outcomes Long Term:  Able to use RPE to guide intensity level when exercising independently;Short Term: Able to use RPE daily in rehab to express subjective intensity level       Able to understand and use Dyspnea scale Yes       Intervention Provide education and explanation on how to use Dyspnea scale       Expected Outcomes Short Term: Able to use Dyspnea scale daily in rehab to express subjective sense of shortness of breath during exertion;Long Term: Able to use Dyspnea scale to guide intensity level when exercising independently       Knowledge and understanding of Target Heart Rate Range (THRR) Yes       Intervention Provide education and explanation of THRR including how the numbers were predicted and where they are located for reference       Expected  Outcomes Short Term: Able to state/look up THRR;Short Term: Able to use daily as guideline for intensity in rehab;Long Term: Able to use THRR to govern intensity when exercising independently       Able to check pulse independently Yes       Intervention Review the importance of being able to check your own pulse for safety during independent exercise;Provide education and demonstration on how to check pulse in carotid and radial arteries.       Expected Outcomes Short Term: Able to explain why pulse checking is important during independent exercise;Long Term: Able to check pulse independently and accurately       Understanding of Exercise Prescription Yes       Intervention Provide education, explanation, and written materials on patient's individual exercise prescription       Expected Outcomes Short Term: Able to explain program exercise prescription;Long Term: Able to explain home exercise prescription to exercise independently                Exercise Goals Re-Evaluation :  Exercise Goals Re-Evaluation     Row Name 03/15/22 1001 03/20/22 1001  03/27/22 1508 04/09/22 1409 04/10/22 1011     Exercise Goal Re-Evaluation   Exercise Goals Review Able to understand and use rate of perceived exertion (RPE) scale;Able to understand and use Dyspnea scale;Knowledge and understanding of Target Heart Rate Range (THRR);Understanding of Exercise Prescription Able to understand and use rate of perceived exertion (RPE) scale;Able to understand and use Dyspnea scale;Knowledge and understanding of Target Heart Rate Range (THRR);Understanding of Exercise Prescription;Increase Physical Activity;Increase Strength and Stamina;Able to check pulse independently Increase Physical Activity;Understanding of Exercise Prescription;Increase Strength and Stamina Increase Physical Activity;Understanding of Exercise Prescription;Increase Strength and Stamina Increase Physical Activity;Understanding of Exercise Prescription;Increase Strength and Stamina   Comments Reviewed RPE scale, THR and program prescription with pt today.  Pt voiced understanding and was given a copy of goals to take home. Reviewed home exercise with pt today.  Pt plans to walk and use personal treadmill and elliptical at home for exercise.  Reviewed THR, pulse, RPE, sign and symptoms, pulse oximetery and when to call 911 or MD.  Also discussed weather considerations and indoor options.  Pt voiced understanding. Elijah Birkom is off to a good start in rehab.  He has completed 3 full day sessions thus far.  He is already up to 3.5 METs on the XR.  We will conitnue to monitor his progress. Elijah Birkom is doing well in rehab. He recently improved his workload on the treadmill to a speed of 3 mph and an incline of 2%. He also improved from 7 lb to 10 lb hand weights for resistance training. We will continue to monitor his progress. tom continues to use his elliptical and treadmill at home,  where he exercises 10 min on elliptical, 20 min on on treadmill. He has been working on the treadmill at rehab more often to condition himself  better on it. He exercises 2/3 days outside of rehab.  He is chekcing his HR and states that is gets up ot a max of 100- 124 bpm. He is going to buy a fitness watch. He is also interested in possibly [facility] to change up some of his routine and have more access to weight machines.   Expected Outcomes Short: Use RPE daily to regulate intensity. Long: Follow program prescription in THR. Short: Start to add in exercise at home Long; Conitnue to improve stamina Short: Continue to attend  rehab regularly Long: continue to follow program presrcription Short: Try to use elliptical. Long: continue to improve strength and stamina. Short: Continue to exercise at home, monitor HR Long: continue to exercise independently at home    Row Name 04/23/22 1051             Exercise Goal Re-Evaluation   Exercise Goals Review Increase Physical Activity;Understanding of Exercise Prescription;Increase Strength and Stamina       Comments Elijah Birkom continues to do well in rehab. He did increase to level 7 on the XR working over 5 METS! He also increaed his workload on the treadmill to a 3.2 speed with a 3% incline. He continues to work on the elliptical 10 minutes each time and hopes to increase his duration progressively. He is also now using 12 lbs for handweights. We will continue to monitor.       Expected Outcomes Short: Increase duration on elliptical Long: Continue to increase overall stamina and MET level                Discharge Exercise Prescription (Final Exercise Prescription Changes):  Exercise Prescription Changes - 04/23/22 1000       Response to Exercise   Blood Pressure (Admit) 118/72    Blood Pressure (Exit) 108/64    Heart Rate (Admit) 72 bpm    Heart Rate (Exercise) 120 bpm    Heart Rate (Exit) 77 bpm    Rating of Perceived Exertion (Exercise) 13    Symptoms none    Duration Continue with 30 min of aerobic exercise without signs/symptoms of physical distress.    Intensity THRR  unchanged      Progression   Progression Continue to progress workloads to maintain intensity without signs/symptoms of physical distress.    Average METs 4.33      Resistance Training   Training Prescription Yes    Weight 12 lb    Reps 10-15      Interval Training   Interval Training No      Treadmill   MPH 3.2    Grade 3    Minutes 15    METs 4.77      NuStep   Level 5    Minutes 15    METs 4.2      Elliptical   Level 1    Speed 2.5    Minutes 15    METs 3.7      REL-XR   Level 7    Minutes 15    METs 5      Home Exercise Plan   Plans to continue exercise at Home (comment)   walking, treadmill, elliptical   Frequency Add 2 additional days to program exercise sessions.    Initial Home Exercises Provided 03/20/22      Oxygen   Maintain Oxygen Saturation 88% or higher             Nutrition:  Target Goals: Understanding of nutrition guidelines, daily intake of sodium 1500mg , cholesterol 200mg , calories 30% from fat and 7% or less from saturated fats, daily to have 5 or more servings of fruits and vegetables.  Education: All About Nutrition: -Group instruction provided by verbal, written material, interactive activities, discussions, models, and posters to present general guidelines for heart healthy nutrition including fat, fiber, MyPlate, the role of sodium in heart healthy nutrition, utilization of the nutrition label, and utilization of this knowledge for meal planning. Follow up email sent as well. Written material given at graduation. Flowsheet Row  Cardiac Rehab from 03/22/2022 in [facility] Cardiac and Pulmonary Rehab  Education need identified 03/12/22       Biometrics:  Pre Biometrics - 03/12/22 1502       Pre Biometrics   Height 6' 0.5" (1.842 m)    Weight 207 lb 12.8 oz (94.3 kg)    Waist Circumference 38.5 inches    Hip Circumference 39 inches    Waist to Hip Ratio 0.99 %    BMI (Calculated) 27.78    Single Leg Stand 2.5 seconds               Nutrition Therapy Plan and Nutrition Goals:  Nutrition Therapy & Goals - 03/12/22 1129       Nutrition Therapy   Diet Heart healthy, low Na    Drug/Food Interactions Statins/Certain Fruits    Protein (specify units) 75-85g    Fiber 30 grams    Whole Grain Foods 3 servings    Saturated Fats 16 max. grams    Fruits and Vegetables 8 servings/day    Sodium 2 grams      Personal Nutrition Goals   Nutrition Goal ST:  review paperwork, include snacks during the day to see how that affects hunger in evening LT: practice MyPlate guidelines, include 5-8 servings or fruit/vegetables per day    Comments 67 y.o. M admitted to cardiac rehab s/p NSTEMI. PMHx includes HTN, HLD, former tobacco use, CAD, a. fib. Relevant medications includes lexapro, MVI with minerals, crestor. B: oatmeal with bananas, walnuts, blueberries - if he gets breakfast out, he gets oatmeal from [employer] L: chicken salad or deli [place] on whole wheat bread D: chicken, pork (~1x/week), chooses lean ground meat (~1x/week), fish including salmon. He enjoys potatoes sometimes with his meals as well as non-starchy vegetbles such as broccoli. S: peanut butter - sometimes with fruit. [name] reports feeling hungry towards the end of the day - discussed including some snacks such as yogurt with nuts and fruit or peanut butter with fruit to eat during the day in-between meals. [name] reports not salting any of his food and limiting eating out to no more than 2-3x/week. He will cook with olive oil, but does use some butter with a potato or on toast sparingly. Discussed MyPlate and heart healthy eating.      Intervention Plan   Intervention Prescribe, educate and counsel regarding individualized specific dietary modifications aiming towards targeted core components such as weight, hypertension, lipid management, diabetes, heart failure and other comorbidities.;Nutrition handout(s) given to patient.    Expected Outcomes Short Term Goal:  Understand basic principles of dietary content, such as calories, fat, sodium, cholesterol and nutrients.;Short Term Goal: A plan has been developed with personal nutrition goals set during dietitian appointment.;Long Term Goal: Adherence to prescribed nutrition plan.             Nutrition Assessments:  MEDIFICTS Score Key: ?70 Need to make dietary changes  40-70 Heart Healthy Diet ? 40 Therapeutic Level Cholesterol Diet  Flowsheet Row Cardiac Rehab from 03/12/2022 in [facility] Cardiac and Pulmonary Rehab  Picture Your Plate Total Score on Admission 80      Picture Your Plate Scores: <16 Unhealthy dietary pattern with much room for improvement. 41-50 Dietary pattern unlikely to meet recommendations for good health and room for improvement. 51-60 More healthful dietary pattern, with some room for improvement.  >60 Healthy dietary pattern, although there may be some specific behaviors that could be improved.    Nutrition Goals Re-Evaluation:  Nutrition Goals Re-Evaluation  Row Name 04/10/22 1023             Goals   Nutrition Goal ST:  review paperwork, include snacks during the day to see how that affects hunger in evening LT: practice MyPlate guidelines, include 5-8 servings or fruit/vegetables per day       Comment Elijah Birk is wanting to lose weight, he has a weight goal of 195 lb His biggest weakness is that he snacks at night and his go to is peanut butter which he knows he exceeds the serving sizes. He typically eats double the serving size and is aware it is calorically dense. Lately, he has been trying to pair it with an apple to increase his fruit and fiber. We talked about alternatively using PB2 and looking up recipes for banana "nice-cream."  He has been snacking more specifically on mixed nuts such as almonds, cashews, etc. He has been drinking Atkin shakes as well. He also claims he is eating more salads and increased his intake on fruit, specifically on blueberries and  bananas. Most mornings, he is eating with oatmeal  for breakfast paired with fruits and nuts. He is looking to increase his water intake as he know he slacks on that.       Expected Outcome Short: Focus on increasing more talk, continue to add in fruits and veggies Long: Continue to eat heart healthy diet                Nutrition Goals Discharge (Final Nutrition Goals Re-Evaluation):  Nutrition Goals Re-Evaluation - 04/10/22 1023       Goals   Nutrition Goal ST:  review paperwork, include snacks during the day to see how that affects hunger in evening LT: practice MyPlate guidelines, include 5-8 servings or fruit/vegetables per day    Comment Elijah Birk is wanting to lose weight, he has a weight goal of 195 lb His biggest weakness is that he snacks at night and his go to is peanut butter which he knows he exceeds the serving sizes. He typically eats double the serving size and is aware it is calorically dense. Lately, he has been trying to pair it with an apple to increase his fruit and fiber. We talked about alternatively using PB2 and looking up recipes for banana "nice-cream."  He has been snacking more specifically on mixed nuts such as almonds, cashews, etc. He has been drinking Atkin shakes as well. He also claims he is eating more salads and increased his intake on fruit, specifically on blueberries and bananas. Most mornings, he is eating with oatmeal  for breakfast paired with fruits and nuts. He is looking to increase his water intake as he know he slacks on that.    Expected Outcome Short: Focus on increasing more talk, continue to add in fruits and veggies Long: Continue to eat heart healthy diet             Psychosocial: Target Goals: Acknowledge presence or absence of significant depression and/or stress, maximize coping skills, provide positive support system. Participant is able to verbalize types and ability to use techniques and skills needed for reducing stress and depression.    Education: Stress, Anxiety, and Depression - Group verbal and visual presentation to define topics covered.  Reviews how body is impacted by stress, anxiety, and depression.  Also discusses healthy ways to reduce stress and to treat/manage anxiety and depression.  Written material given at graduation.   Education: Sleep Hygiene -Provides group verbal and written instruction  about how sleep can affect your health.  Define sleep hygiene, discuss sleep cycles and impact of sleep habits. Review good sleep hygiene tips.    Initial Review & Psychosocial Screening:  Initial Psych Review & Screening - 02/14/22 0920       Initial Review   Current issues with Current Psychotropic Meds;Current Anxiety/Panic   lexapro is for anxiety, been taking for years. It continues to help.     Family Dynamics   Good Support System? Yes   wife, daughter grandkids,  step kids     Barriers   Psychosocial barriers to participate in program There are no identifiable barriers or psychosocial needs.      Screening Interventions   Interventions Encouraged to exercise;To provide support and resources with identified psychosocial needs;Provide feedback about the scores to participant    Expected Outcomes Short Term goal: Utilizing psychosocial counselor, staff and physician to assist with identification of specific Stressors or current issues interfering with healing process. Setting desired goal for each stressor or current issue identified.;Long Term Goal: Stressors or current issues are controlled or eliminated.;Short Term goal: Identification and review with participant of any Quality of Life or Depression concerns found by scoring the questionnaire.;Long Term goal: The participant improves quality of Life and PHQ9 Scores as seen by post scores and/or verbalization of changes             Quality of Life Scores:   Quality of Life - 03/12/22 1502       Quality of Life   Select Quality of Life      Quality  of Life Scores   Health/Function Pre 28 %    Socioeconomic Pre 23.94 %    Psych/Spiritual Pre 30 %    Family Pre 28.8 %    GLOBAL Pre 27.59 %            Scores of 19 and below usually indicate a poorer quality of life in these areas.  A difference of  2-3 points is a clinically meaningful difference.  A difference of 2-3 points in the total score of the Quality of Life Index has been associated with significant improvement in overall quality of life, self-image, physical symptoms, and general health in studies assessing change in quality of life.  PHQ-9: Review Flowsheet       03/12/2022  Depression screen PHQ 2/9  Decreased Interest 0  Down, Depressed, Hopeless 0  PHQ - 2 Score 0  Altered sleeping 1  Tired, decreased energy 0  Change in appetite 1  Feeling bad or failure about yourself  0  Trouble concentrating 0  Moving slowly or fidgety/restless 0  Suicidal thoughts 0  PHQ-9 Score 2  Difficult doing work/chores Not difficult at all   Interpretation of Total Score  Total Score Depression Severity:  1-4 = Minimal depression, 5-9 = Mild depression, 10-14 = Moderate depression, 15-19 = Moderately severe depression, 20-27 = Severe depression   Psychosocial Evaluation and Intervention:  Psychosocial Evaluation - 02/14/22 0943       Psychosocial Evaluation & Interventions   Interventions Encouraged to exercise with the program and follow exercise prescription    Comments Elijah Birkom has no barriers to attending the prgoram. He is ready to learn how to manage his heart disease and live as long as he can. He has a history of anxiety and is on Lexapro. He stated that this helps. He has changed jobs and his anxiety is not as bad now. His health care team has  kept him on Lexapro"if it is working,don't change it".  [name] lives with his wife. She and his daughter, grandchildren and step children are his support.  He is the support for his daughter and grandchildren. He is ready to get started  with the program.    Expected Outcomes STG Elijah Birk attends all scheduled sessions, he continues to keep anxiety levels low. LTG Elijah Birk continues his exercise progression and continues to manage any anxiety symptoms after discharge from the program.    Continue Psychosocial Services  Follow up required by staff             Psychosocial Re-Evaluation:  Psychosocial Re-Evaluation     Row Name 04/06/22 1006 04/10/22 1033           Psychosocial Re-Evaluation   Current issues with None Identified None Identified      Comments Reviewed patient health questionnaire (PHQ-9) with patient for follow up. Previously, patients score indicated signs/symptoms of depression.  Reviewed to see if patient is improving symptom wise while in program.  Score improved and patient states that it is because his work and family life are good. He states exercise has helped him alot as well. Elijah Birk states he is doing well mentally. States "home life" is good and has no complaints as he has supportive family at home well. He feels he manages his stress well (from what he has) as he keeps busy. 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 He does takes Lexapro that he feels like helps him. His sleep is good. He denies other concerns at this time.      Expected Outcomes Short: Continue to attend [facility] regularly for regular exercise and social engagement. Long: Continue to improve symptoms and manage a positive mental state. Short: Continue to manage his stress appropriately, notify MD or staff of any changes Long: Continue to maintain positive attitude      Interventions Encouraged to attend Cardiac Rehabilitation for the exercise Encouraged to attend Cardiac Rehabilitation for the exercise      Continue Psychosocial Services  No Follow up required Follow up required by staff               Psychosocial Discharge (Final Psychosocial Re-Evaluation):  Psychosocial Re-Evaluation -  04/10/22 1033       Psychosocial Re-Evaluation   Current issues with None Identified    Comments Elijah Birk states he is doing well mentally. States "home life" is good and has no complaints as he has supportive family at home well. He feels he manages his stress well (from what he has) as he keeps busy. 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 He does takes Lexapro that he feels like helps him. His sleep is good. He denies other concerns at this time.    Expected Outcomes Short: Continue to manage his stress appropriately, notify MD or staff of any changes Long: Continue to maintain positive attitude    Interventions Encouraged to attend Cardiac Rehabilitation for the exercise    Continue Psychosocial Services  Follow up required by staff             Vocational Rehabilitation: Provide vocational rehab assistance to qualifying candidates.   Vocational Rehab Evaluation & Intervention:   Education: Education Goals: Education classes will be provided on a variety of topics geared toward better understanding of heart health and risk factor modification. Participant will state understanding/return demonstration of topics presented as noted  by education test scores.  Learning Barriers/Preferences:   General Cardiac Education Topics:  AED/CPR: - Group verbal and written instruction with the use of models to demonstrate the basic use of the AED with the basic ABC's of resuscitation.   Anatomy and Cardiac Procedures: - Group verbal and visual presentation and models provide information about basic cardiac anatomy and function. Reviews the testing methods done to diagnose heart disease and the outcomes of the test results. Describes the treatment choices: Medical Management, Angioplasty, or Coronary Bypass Surgery for treating various heart conditions including Myocardial Infarction, Angina, Valve Disease, and Cardiac Arrhythmias.  Written material given at  graduation.   Medication Safety: - Group verbal and visual instruction to review commonly prescribed medications for heart and lung disease. Reviews the medication, class of the drug, and side effects. Includes the steps to properly store meds and maintain the prescription regimen.  Written material given at graduation. Flowsheet Row Cardiac Rehab from 03/22/2022 in [facility] Cardiac and Pulmonary Rehab  Date 03/15/22  Educator SB  Instruction Review Code 1- Verbalizes Understanding       Intimacy: - Group verbal instruction through game format to discuss how heart and lung disease can affect sexual intimacy. Written material given at graduation..   Know Your Numbers and Heart Failure: - Group verbal and visual instruction to discuss disease risk factors for cardiac and pulmonary disease and treatment options.  Reviews associated critical values for Overweight/Obesity, Hypertension, Cholesterol, and Diabetes.  Discusses basics of heart failure: signs/symptoms and treatments.  Introduces Heart Failure Zone chart for action plan for heart failure.  Written material given at graduation. Flowsheet Row Cardiac Rehab from 03/22/2022 in [facility] Cardiac and Pulmonary Rehab  Education need identified 03/12/22  Date 03/22/22  Educator SB  Instruction Review Code 1- Verbalizes Understanding       Infection Prevention: - Provides verbal and written material to individual with discussion of infection control including proper hand washing and proper equipment cleaning during exercise session. Flowsheet Row Cardiac Rehab from 03/22/2022 in [facility] Cardiac and Pulmonary Rehab  Date 03/12/22  Educator [facility]  Instruction Review Code 1- Verbalizes Understanding       Falls Prevention: - Provides verbal and written material to individual with discussion of falls prevention and safety. Flowsheet Row Cardiac Rehab from 03/22/2022 in [facility] Cardiac and Pulmonary Rehab  Date 02/14/22  Educator SB  Instruction  Review Code 1- Verbalizes Understanding       Other: -Provides group and verbal instruction on various topics (see comments)   Knowledge Questionnaire Score:  Knowledge Questionnaire Score - 03/12/22 1503       Knowledge Questionnaire Score   Pre Score 22/26             Core Components/Risk Factors/Patient Goals at Admission:  Personal Goals and Risk Factors at Admission - 03/12/22 1503       Core Components/Risk Factors/Patient Goals on Admission    Weight Management Yes;Weight Loss    Intervention Weight Management: Develop a combined nutrition and exercise program designed to reach desired caloric intake, while maintaining appropriate intake of nutrient and fiber, sodium and fats, and appropriate energy expenditure required for the weight goal.;Weight Management/Obesity: Establish reasonable short term and long term weight goals.;Weight Management: Provide education and appropriate resources to help participant work on and attain dietary goals.    Admit Weight 207 lb 12.8 oz (94.3 kg)    Goal Weight: Short Term 203 lb (92.1 kg)    Goal Weight: Long Term 195 lb (  88.5 kg)    Expected Outcomes Short Term: Continue to assess and modify interventions until short term weight is achieved;Long Term: Adherence to nutrition and physical activity/exercise program aimed toward attainment of established weight goal;Weight Loss: Understanding of general recommendations for a balanced deficit meal plan, which promotes 1-2 lb weight loss per week and includes a negative energy balance of [phone] kcal/d;Understanding recommendations for meals to include 15-35% energy as protein, 25-35% energy from fat, 35-60% energy from carbohydrates, less than 200mg  of dietary cholesterol, 20-35 gm of total fiber daily;Understanding of distribution of calorie intake throughout the day with the consumption of 4-5 meals/snacks    Hypertension Yes    Intervention Provide education on lifestyle modifcations  including regular physical activity/exercise, weight management, moderate sodium restriction and increased consumption of fresh fruit, vegetables, and low fat dairy, alcohol moderation, and smoking cessation.;Monitor prescription use compliance.    Expected Outcomes Short Term: Continued assessment and intervention until BP is < 140/33mm HG in hypertensive participants. < 130/15mm HG in hypertensive participants with diabetes, heart failure or chronic kidney disease.;Long Term: Maintenance of blood pressure at goal levels.    Lipids Yes    Intervention Provide education and support for participant on nutrition & aerobic/resistive exercise along with prescribed medications to achieve LDL 70mg , HDL >40mg .    Expected Outcomes Short Term: Participant states understanding of desired cholesterol values and is compliant with medications prescribed. Participant is following exercise prescription and nutrition guidelines.;Long Term: Cholesterol controlled with medications as prescribed, with individualized exercise RX and with personalized nutrition plan. Value goals: LDL < 70mg , HDL > 40 mg.             Education:Diabetes - Individual verbal and written instruction to review signs/symptoms of diabetes, desired ranges of glucose level fasting, after meals and with exercise. Acknowledge that pre and post exercise glucose checks will be done for 3 sessions at entry of program.   Core Components/Risk Factors/Patient Goals Review:   Goals and Risk Factor Review     Row Name 04/10/22 1028             Core Components/Risk Factors/Patient Goals Review   Personal Goals Review Weight Management/Obesity;Hypertension;Lipids       Review goal weight is [id number]. currentl 208 lb. Checking BP at home, ranging 110/7os. Checking HR 60s rest, 100-120s for exercise. BP at rehab good too.Taking all medications as precsrbied. lightheaded, cut ami in hald but still sympspatic.                Core  Components/Risk Factors/Patient Goals at Discharge (Final Review):   Goals and Risk Factor Review - 04/10/22 1028       Core Components/Risk Factors/Patient Goals Review   Personal Goals Review Weight Management/Obesity;Hypertension;Lipids    Review goal weight is [id number]. currentl 208 lb. Checking BP at home, ranging 110/7os. Checking HR 60s rest, 100-120s for exercise. BP at rehab good too.Taking all medications as precsrbied. lightheaded, cut ami in hald but still sympspatic.             ITP Comments:  ITP Comments     Row Name 02/14/22 0941 03/12/22 1451 03/15/22 1001 03/28/22 0943 04/25/22 0856   ITP Comments Virtual orientation call completed today. he has an appointment on Date: 02/26/2022  for EP eval and gym Orientation.  Documentation of diagnosis can be found in [facility] Date: 09/17/2021 .       Gershon Mussel took time researching Cardiac Rehab before committing to the program. Completed 6MWT and gym  orientation. Initial ITP created and sent for review to Dr. Bethann Punches, Medical Director. First full day of exercise!  Patient was oriented to gym and equipment including functions, settings, policies, and procedures.  Patient's individual exercise prescription and treatment plan were reviewed.  All starting workloads were established based on the results of the 6 minute walk test done at initial orientation visit.  The plan for exercise progression was also introduced and progression will be customized based on patient's performance and goals. 30 Day review completed. Medical Director ITP review done, changes made as directed, and signed approval by Medical Director.    new to program 30 Day review completed. Medical Director ITP review done, changes made as directed, and signed approval by Medical Director.            Comments:

## 2022-04-26 ENCOUNTER — Encounter: Payer: Medicare Other | Admitting: *Deleted

## 2022-04-26 DIAGNOSIS — I214 Non-ST elevation (NSTEMI) myocardial infarction: Secondary | ICD-10-CM

## 2022-04-26 DIAGNOSIS — Z955 Presence of coronary angioplasty implant and graft: Secondary | ICD-10-CM

## 2022-04-26 DIAGNOSIS — I252 Old myocardial infarction: Secondary | ICD-10-CM | POA: Diagnosis not present

## 2022-04-26 NOTE — Progress Notes (Signed)
Daily Session Note  Patient Details  Name: Tommy Rowe MRN: [id number] Date of Birth: [birth year] Referring Provider:   Flowsheet Row Cardiac Rehab from 03/12/2022 in [facility] Cardiac and Pulmonary Rehab  Referring Provider Lujean Amel MD       Encounter Date: 04/26/2022  Check In:  Session Check In - 04/26/22 0958       Check-In   Supervising physician immediately available to respond to emergencies See telemetry face sheet for immediately available ER MD    Location [facility]-Cardiac & Pulmonary Rehab    Staff Present Darlyne Russian, RN, ADN;Meredith Sherryll Burger, RN Abel Presto, MS, ACSM CEP, Exercise Physiologist;Joseph Tessie Fass, [place]    Virtual Visit No    Medication changes reported     No    Fall or balance concerns reported    No    Warm-up and Cool-down Performed on first and last piece of equipment    Resistance Training Performed Yes    VAD Patient? No    PAD/SET Patient? No      Pain Assessment   Currently in Pain? No/denies                Social History   Tobacco Use  Smoking Status Former   Types: Cigarettes   Quit date: 04/02/1988   Years since quitting: 34.0   Passive exposure: Past  Smokeless Tobacco Never    Goals Met:  Independence with exercise equipment Exercise tolerated well No report of concerns or symptoms today Strength training completed today  Goals Unmet:  Not Applicable  Comments: Pt able to follow exercise prescription today without complaint.  Will continue to monitor for progression.    Dr. Emily Filbert is Medical Director for [facility].  Dr. Ottie Glazier is Medical Director for [facility] Pulmonary Rehabilitation.

## 2022-04-27 ENCOUNTER — Encounter: Payer: Medicare Other | Admitting: *Deleted

## 2022-04-27 DIAGNOSIS — I214 Non-ST elevation (NSTEMI) myocardial infarction: Secondary | ICD-10-CM

## 2022-04-27 DIAGNOSIS — Z955 Presence of coronary angioplasty implant and graft: Secondary | ICD-10-CM

## 2022-04-27 DIAGNOSIS — I252 Old myocardial infarction: Secondary | ICD-10-CM | POA: Diagnosis not present

## 2022-04-27 NOTE — Progress Notes (Signed)
Daily Session Note  Patient Details  Name: Tommy Rowe MRN: [id number] Date of Birth: October 11, 1955 Referring Provider:   Flowsheet Row Cardiac Rehab from 03/12/2022 in [facility] Cardiac and Pulmonary Rehab  Referring Provider Lujean Amel MD       Encounter Date: 04/27/2022  Check In:  Session Check In - 04/27/22 1045       Check-In   Supervising physician immediately available to respond to emergencies See telemetry face sheet for immediately available ER MD    Location [facility]-Cardiac & Pulmonary Rehab    Staff Present Heath Lark, RN, BSN, CCRP;Jessica [place], MA, RCEP, CCRP, CCET;Joseph [place], [place]    Virtual Visit No    Medication changes reported     No    Fall or balance concerns reported    No    Warm-up and Cool-down Performed on first and last piece of equipment    Resistance Training Performed Yes    VAD Patient? No    PAD/SET Patient? No      Pain Assessment   Currently in Pain? No/denies                Social History   Tobacco Use  Smoking Status Former   Types: Cigarettes   Quit date: 04/02/1988   Years since quitting: 34.0   Passive exposure: Past  Smokeless Tobacco Never    Goals Met:  Independence with exercise equipment Exercise tolerated well No report of concerns or symptoms today  Goals Unmet:  Not Applicable  Comments: Pt able to follow exercise prescription today without complaint.  Will continue to monitor for progression.    Dr. Emily Filbert is Medical Director for [facility].  Dr. Ottie Glazier is Medical Director for [facility] Pulmonary Rehabilitation.

## 2022-05-01 ENCOUNTER — Encounter: Payer: Medicare Other | Admitting: *Deleted

## 2022-05-01 DIAGNOSIS — Z955 Presence of coronary angioplasty implant and graft: Secondary | ICD-10-CM

## 2022-05-01 DIAGNOSIS — I214 Non-ST elevation (NSTEMI) myocardial infarction: Secondary | ICD-10-CM

## 2022-05-01 DIAGNOSIS — I252 Old myocardial infarction: Secondary | ICD-10-CM | POA: Diagnosis not present

## 2022-05-01 NOTE — Progress Notes (Signed)
Daily Session Note  Patient Details  Name: Tommy Rowe MRN: [id number] Date of Birth: 1955-07-13 Referring Provider:   Flowsheet Row Cardiac Rehab from 03/12/2022 in [facility] Cardiac and Pulmonary Rehab  Referring Provider Lujean Amel MD       Encounter Date: 05/01/2022  Check In:  Session Check In - 05/01/22 1031       Check-In   Supervising physician immediately available to respond to emergencies See telemetry face sheet for immediately available ER MD    Location [facility]-Cardiac & Pulmonary Rehab    Staff Present Heath Lark, RN, BSN, CCRP;Jessica [place], MA, RCEP, CCRP, CCET;Noah Tickle, BS, Exercise Physiologist    Virtual Visit No    Medication changes reported     No    Fall or balance concerns reported    No    Warm-up and Cool-down Performed on first and last piece of equipment    Resistance Training Performed Yes    VAD Patient? No    PAD/SET Patient? No      Pain Assessment   Currently in Pain? No/denies                Social History   Tobacco Use  Smoking Status Former   Types: Cigarettes   Quit date: 04/02/1988   Years since quitting: 34.1   Passive exposure: Past  Smokeless Tobacco Never    Goals Met:  Independence with exercise equipment Exercise tolerated well No report of concerns or symptoms today  Goals Unmet:  Not Applicable  Comments: Pt able to follow exercise prescription today without complaint.  Will continue to monitor for progression.    Dr. Emily Filbert is Medical Director for [facility].  Dr. Ottie Glazier is Medical Director for [facility] Pulmonary Rehabilitation.

## 2022-05-03 ENCOUNTER — Encounter: Payer: Medicare Other | Attending: Internal Medicine | Admitting: *Deleted

## 2022-05-03 DIAGNOSIS — I214 Non-ST elevation (NSTEMI) myocardial infarction: Secondary | ICD-10-CM | POA: Diagnosis present

## 2022-05-03 DIAGNOSIS — I252 Old myocardial infarction: Secondary | ICD-10-CM | POA: Diagnosis not present

## 2022-05-03 DIAGNOSIS — Z955 Presence of coronary angioplasty implant and graft: Secondary | ICD-10-CM | POA: Insufficient documentation

## 2022-05-03 DIAGNOSIS — Z48812 Encounter for surgical aftercare following surgery on the circulatory system: Secondary | ICD-10-CM | POA: Insufficient documentation

## 2022-05-03 NOTE — Progress Notes (Signed)
Daily Session Note  Patient Details  Name: DANUEL FELICETTI MRN: [id number] Date of Birth: [birth year] Referring Provider:   Flowsheet Row Cardiac Rehab from 03/12/2022 in [facility] Cardiac and Pulmonary Rehab  Referring Provider Lujean Amel MD       Encounter Date: 05/03/2022  Check In:  Session Check In - 05/03/22 1010       Check-In   Supervising physician immediately available to respond to emergencies See telemetry face sheet for immediately available ER MD    Location [facility]-Cardiac & Pulmonary Rehab    Staff Present Darlyne Russian, RN, Lorin Mercy, MS, ACSM CEP, Exercise Physiologist;Joseph Tessie Fass, [place]    Virtual Visit No    Medication changes reported     No    Fall or balance concerns reported    No    Warm-up and Cool-down Performed on first and last piece of equipment    Resistance Training Performed Yes    VAD Patient? No    PAD/SET Patient? No      Pain Assessment   Currently in Pain? No/denies                Social History   Tobacco Use  Smoking Status Former   Types: Cigarettes   Quit date: 04/02/1988   Years since quitting: 34.1   Passive exposure: Past  Smokeless Tobacco Never    Goals Met:  Independence with exercise equipment Exercise tolerated well No report of concerns or symptoms today Strength training completed today  Goals Unmet:  Not Applicable  Comments: Pt able to follow exercise prescription today without complaint.  Will continue to monitor for progression.    Dr. Emily Filbert is Medical Director for [place].  Dr. Ottie Glazier is Medical Director for [facility] Pulmonary Rehabilitation.

## 2022-05-04 ENCOUNTER — Encounter: Payer: Medicare Other | Admitting: *Deleted

## 2022-05-04 DIAGNOSIS — Z955 Presence of coronary angioplasty implant and graft: Secondary | ICD-10-CM

## 2022-05-04 DIAGNOSIS — I214 Non-ST elevation (NSTEMI) myocardial infarction: Secondary | ICD-10-CM

## 2022-05-04 NOTE — Progress Notes (Signed)
Daily Session Note  Patient Details  Name: Tommy Rowe MRN: [id number] Date of Birth: March 31, 1956 Referring Provider:   Flowsheet Row Cardiac Rehab from 03/12/2022 in [facility] Cardiac and Pulmonary Rehab  Referring Provider Lujean Amel MD       Encounter Date: 05/04/2022  Check In:  Session Check In - 05/04/22 1035       Check-In   Supervising physician immediately available to respond to emergencies See telemetry face sheet for immediately available ER MD    Location [facility]-Cardiac & Pulmonary Rehab    Staff Present Heath Lark, RN, BSN, CCRP;Jessica [place], MA, RCEP, CCRP, CCET;Joseph [place], [place]    Virtual Visit No    Medication changes reported     No    Fall or balance concerns reported    No    Warm-up and Cool-down Performed on first and last piece of equipment    Resistance Training Performed Yes    VAD Patient? No    PAD/SET Patient? No      Pain Assessment   Currently in Pain? No/denies                Social History   Tobacco Use  Smoking Status Former   Types: Cigarettes   Quit date: 04/02/1988   Years since quitting: 34.1   Passive exposure: Past  Smokeless Tobacco Never    Goals Met:  Independence with exercise equipment Exercise tolerated well No report of concerns or symptoms today  Goals Unmet:  Not Applicable  Comments: Pt able to follow exercise prescription today without complaint.  Will continue to monitor for progression.    Dr. Emily Filbert is Medical Director for [place].  Dr. Ottie Glazier is Medical Director for [facility] Pulmonary Rehabilitation.

## 2022-05-08 ENCOUNTER — Encounter: Payer: Medicare Other | Admitting: *Deleted

## 2022-05-08 DIAGNOSIS — I214 Non-ST elevation (NSTEMI) myocardial infarction: Secondary | ICD-10-CM

## 2022-05-08 DIAGNOSIS — Z955 Presence of coronary angioplasty implant and graft: Secondary | ICD-10-CM

## 2022-05-08 NOTE — Progress Notes (Signed)
Daily Session Note  Patient Details  Name: TIMOTH SCHARA MRN: [id number] Date of Birth: 1955/11/17 Referring Provider:   Flowsheet Row Cardiac Rehab from 03/12/2022 in [facility] Cardiac and Pulmonary Rehab  Referring Provider Lujean Amel MD       Encounter Date: 05/08/2022  Check In:  Session Check In - 05/08/22 1038       Check-In   Supervising physician immediately available to respond to emergencies See telemetry face sheet for immediately available ER MD    Location [facility]Card[facility] Pul[facility] Re[facility]    Staff Present Heath Lark, RN, BSN, CCRP;Jessica [place], MA, RCEP, CCRP, Bertram Gala, MS, ACSM CEP, Exercise Physiologist    Virtual Visit No    Medication changes reported     No    Fall or balance concerns reported    No    Warm-up and Cool-down Performed on first and last piece of equipment    Resistance Training Performed Yes    VAD Patient? No    PAD/SET Patient? No      Pain Assessment   Currently in Pain? No/denies                Social History   Tobacco Use  Smoking Status Former   Types: Cigarettes   Quit date: 04/02/1988   Years since quitting: 34.1   Passive exposure: Past  Smokeless Tobacco Never    Goals Met:  Independence with exercise equipment Exercise tolerated well No report of concerns or symptoms today  Goals Unmet:  Not Applicable  Comments: Pt able to follow exercise prescription today without complaint.  Will continue to monitor for progression.    Dr. Emily Filbert is Medical Director for [place].  Dr. Ottie Glazier is Medical Director for [facility] Pulmonary Rehabilitation.

## 2022-05-08 NOTE — Progress Notes (Signed)
.  h 

## 2022-05-10 ENCOUNTER — Encounter: Payer: Medicare Other | Admitting: *Deleted

## 2022-05-10 DIAGNOSIS — Z955 Presence of coronary angioplasty implant and graft: Secondary | ICD-10-CM

## 2022-05-10 DIAGNOSIS — I214 Non-ST elevation (NSTEMI) myocardial infarction: Secondary | ICD-10-CM

## 2022-05-10 NOTE — Progress Notes (Signed)
Daily Session Note  Patient Details  Name: Tommy Rowe MRN: [id number] Date of Birth: 01-08-1956 Referring Provider:   Flowsheet Row Cardiac Rehab from 03/12/2022 in [facility] Cardiac and Pulmonary Rehab  Referring Provider Lujean Amel MD       Encounter Date: 05/10/2022  Check In:  Session Check In - 05/10/22 1004       Check-In   Supervising physician immediately available to respond to emergencies See telemetry face sheet for immediately available ER MD    Location [facility]Cardiac [facility] Pulmonary Re[facility]    Staff Present Darlyne Russian, RN, Lorin Mercy, MS, ACSM CEP, Exercise Physiologist;Noah Tickle, BS, Exercise Physiologist    Virtual Visit No    Medication changes reported     No    Fall or balance concerns reported    No    Warm-up and Cool-down Performed on first and last piece of equipment    Resistance Training Performed Yes    VAD Patient? No    PAD/SET Patient? No      Pain Assessment   Currently in Pain? No/denies                Social History   Tobacco Use  Smoking Status Former   Types: Cigarettes   Quit date: 04/02/1988   Years since quitting: 34.1   Passive exposure: Past  Smokeless Tobacco Never    Goals Met:  Independence with exercise equipment Exercise tolerated well No report of concerns or symptoms today Strength training completed today  Goals Unmet:  Not Applicable  Comments: Pt able to follow exercise prescription today without complaint.  Will continue to monitor for progression.    Dr. Emily Filbert is Medical Director for [place].  Dr. Ottie Glazier is Medical Director for [facility] Pulmonary Rehabilitation.

## 2022-05-11 ENCOUNTER — Encounter: Payer: Medicare Other | Admitting: *Deleted

## 2022-05-11 DIAGNOSIS — Z955 Presence of coronary angioplasty implant and graft: Secondary | ICD-10-CM

## 2022-05-11 DIAGNOSIS — I214 Non-ST elevation (NSTEMI) myocardial infarction: Secondary | ICD-10-CM

## 2022-05-11 NOTE — Progress Notes (Signed)
Daily Session Note  Patient Details  Name: Tommy Rowe MRN: [id number] Date of Birth: [birth year] Referring Provider:   Flowsheet Row Cardiac Rehab from 03/12/2022 in [facility] Cardiac and Pulmonary Rehab  Referring Provider Lujean Amel MD       Encounter Date: 05/11/2022  Check In:  Session Check In - 05/11/22 1034       Check-In   Supervising physician immediately available to respond to emergencies See telemetry face sheet for immediately available ER MD    Location [facility]Cardiac [facility] Pulmonary Re[facility]    Staff Present Heath Lark, RN, BSN, CCRP;Jessica [place], MA, RCEP, CCRP, CCET;Joseph [place], [place]    Virtual Visit No    Medication changes reported     No    Fall or balance concerns reported    No    Warm-up and Cool-down Performed on first and last piece of equipment    Resistance Training Performed Yes    VAD Patient? No    PAD/SET Patient? No      Pain Assessment   Currently in Pain? No/denies                Social History   Tobacco Use  Smoking Status Former   Types: Cigarettes   Quit date: 04/02/1988   Years since quitting: 34.1   Passive exposure: Past  Smokeless Tobacco Never    Goals Met:  Independence with exercise equipment Exercise tolerated well No report of concerns or symptoms today  Goals Unmet:  Not Applicable  Comments: Pt able to follow exercise prescription today without complaint.  Will continue to monitor for progression.    Dr. Emily Filbert is Medical Director for [place].  Dr. Ottie Glazier is Medical Director for [facility] Pulmonary Rehabilitation.

## 2022-05-15 ENCOUNTER — Encounter: Payer: Medicare Other | Admitting: *Deleted

## 2022-05-15 DIAGNOSIS — Z955 Presence of coronary angioplasty implant and graft: Secondary | ICD-10-CM

## 2022-05-15 DIAGNOSIS — I214 Non-ST elevation (NSTEMI) myocardial infarction: Secondary | ICD-10-CM

## 2022-05-15 NOTE — Progress Notes (Signed)
Daily Session Note  Patient Details  Name: Tommy Rowe MRN: [id number] Date of Birth: 10/16/1955 Referring Provider:   Flowsheet Row Cardiac Rehab from 03/12/2022 in [facility] Cardiac and Pulmonary Rehab  Referring Provider Lujean Amel MD       Encounter Date: 05/15/2022  Check In:  Session Check In - 05/15/22 1044       Check-In   Supervising physician immediately available to respond to emergencies See telemetry face sheet for immediately available ER MD    Location [facility]Cardiac & Pulmonary Rehab    Staff Present Heath Lark, RN, BSN, CCRP;Jessica [place], MA, RCEP, CCRP, Bertram Gala, MS, ACSM CEP, Exercise Physiologist    Virtual Visit No    Medication changes reported     No    Fall or balance concerns reported    No    Warm-up and Cool-down Performed on first and last piece of equipment    Resistance Training Performed Yes    VAD Patient? No    PAD/SET Patient? No      Pain Assessment   Currently in Pain? No/denies                Social History   Tobacco Use  Smoking Status Former   Types: Cigarettes   Quit date: 04/02/1988   Years since quitting: 34.1   Passive exposure: Past  Smokeless Tobacco Never    Goals Met:  Independence with exercise equipment Exercise tolerated well No report of concerns or symptoms today  Goals Unmet:  Not Applicable  Comments: Pt able to follow exercise prescription today without complaint.  Will continue to monitor for progression.    Dr. Emily Filbert is Medical Director for [place].  Dr. Ottie Glazier is Medical Director for [facility] Pulmonary Rehabilitation.

## 2022-05-16 ENCOUNTER — Encounter: Payer: Medicare Other | Admitting: *Deleted

## 2022-05-16 DIAGNOSIS — I214 Non-ST elevation (NSTEMI) myocardial infarction: Secondary | ICD-10-CM

## 2022-05-16 DIAGNOSIS — Z955 Presence of coronary angioplasty implant and graft: Secondary | ICD-10-CM

## 2022-05-16 NOTE — Progress Notes (Signed)
Daily Session Note  Patient Details  Name: LAQUAN MAZZONI MRN: [id number] Date of Birth: Aug 02, 1955 Referring Provider:   Flowsheet Row Cardiac Rehab from 03/12/2022 in [facility] Cardiac and Pulmonary Rehab  Referring Provider Lujean Amel MD       Encounter Date: 05/16/2022  Check In:  Session Check In - 05/16/22 1036       Check-In   Supervising physician immediately available to respond to emergencies See telemetry face sheet for immediately available ER MD    Location [facility]Cardiac & Pulmonary Rehab    Staff Present Darlyne Russian, RN, ADN;Jessica Luan [place], MA, RCEP, CCRP, CCET;Joseph [place], RCP,RRT,BSRT;Noah [place], [place], Exercise Physiologist    Virtual Visit No    Medication changes reported     No    Fall or balance concerns reported    No    Warm-up and Cool-down Performed on first and last piece of equipment    Resistance Training Performed Yes    VAD Patient? No    PAD/SET Patient? No      Pain Assessment   Currently in Pain? No/denies                Social History   Tobacco Use  Smoking Status Former   Types: Cigarettes   Quit date: 04/02/1988   Years since quitting: 34.1   Passive exposure: Past  Smokeless Tobacco Never    Goals Met:  Independence with exercise equipment Exercise tolerated well No report of concerns or symptoms today Strength training completed today  Goals Unmet:  Not Applicable  Comments: Pt able to follow exercise prescription today without complaint.  Will continue to monitor for progression.    Dr. Emily Filbert is Medical Director for [place].  Dr. Ottie Glazier is Medical Director for [facility] Pulmonary Rehabilitation.

## 2022-05-23 ENCOUNTER — Encounter: Payer: Self-pay | Admitting: *Deleted

## 2022-05-23 DIAGNOSIS — Z955 Presence of coronary angioplasty implant and graft: Secondary | ICD-10-CM

## 2022-05-23 DIAGNOSIS — I214 Non-ST elevation (NSTEMI) myocardial infarction: Secondary | ICD-10-CM

## 2022-05-23 NOTE — Progress Notes (Signed)
Cardiac Individual Treatment Plan  Patient Details  Name: TYRIEK SEDANO MRN: [id number] Date of Birth: 09/15/1955 Referring Provider:   Flowsheet Row Cardiac Rehab from 03/12/2022 in [facility] Cardiac and Pulmonary Rehab  Referring Provider Lujean Amel MD       Initial Encounter Date:  Flowsheet Row Cardiac Rehab from 03/12/2022 in [facility] Cardiac and Pulmonary Rehab  Date 03/12/22       Visit Diagnosis: NSTEMI (non-ST elevation myocardial infarction) [facility])  Status post coronary artery stent placement  Patient's Home Medications on Admission:  Current Outpatient Medications:    amiodarone (PACERONE) 100 MG tablet, Take 100 mg by mouth daily., Disp: , Rfl:    ELIQUIS 5 MG TABS tablet, Take 5 mg by mouth 2 (two) times daily., Disp: , Rfl:    escitalopram (LEXAPRO) 20 MG tablet, Take 20 mg by mouth at bedtime., Disp: , Rfl: 1   losartan-hydrochlorothiazide (HYZAAR) 100-25 MG tablet, Take 1 tablet by mouth daily., Disp: , Rfl:    Multiple Vitamin (MULTIVITAMIN WITH MINERALS) TABS tablet, Take 1 tablet by mouth daily. Centrum Silver for Men 50+, Disp: , Rfl:    nitroGLYCERIN (NITROSTAT) 0.4 MG SL tablet, Place 1 tablet (0.4 mg total) under the tongue every 5 (five) minutes as needed for chest pain., Disp: 1 tablet, Rfl: 12   rosuvastatin (CRESTOR) 20 MG tablet, Take 20 mg by mouth at bedtime., Disp: , Rfl:    triamcinolone cream (KENALOG) 0.1 %, APPLY THIN LAYER TOPICALLY TO THE AFFECTED AREA TWICE DAILY, Disp: , Rfl:  No current facility-administered medications for this visit.  Facility-Administered Medications Ordered in Other Visits:    sodium chloride flush (NS) 0.9 % injection 3 mL, 3 mL, Intravenous, Q12H, Scoggins, Amber, NP  Past Medical History: Past Medical History:  Diagnosis Date   A-fib ([place])    Anxiety    HLD (hyperlipidemia)    HTN (hypertension)     Tobacco Use: Social History   Tobacco Use  Smoking Status Former   Types: Cigarettes   Quit date:  04/02/1988   Years since quitting: 34.1   Passive exposure: Past  Smokeless Tobacco Never    Labs: Review Flowsheet       Latest Ref Rng & Units 09/17/2021  Labs for ITP Cardiac and Pulmonary Rehab  Cholestrol 0 - 200 mg/dL 109   LDL (calc) 0 - 99 mg/dL 55   HDL-C >40 mg/dL 49   Trlycerides <150 mg/dL 23   Hemoglobin A1c 4.8 - 5.6 % 5.7      Exercise Target Goals: Exercise Program Goal: Individual exercise prescription set using results from initial 6 min walk test and THRR while considering  patient's activity barriers and safety.   Exercise Prescription Goal: Initial exercise prescription builds to 30-45 minutes a day of aerobic activity, 2-3 days per week.  Home exercise guidelines will be given to patient during program as part of exercise prescription that the participant will acknowledge.   Education: Aerobic Exercise: - Group verbal and visual presentation on the components of exercise prescription. Introduces F.I.T.T principle from ACSM for exercise prescriptions.  Reviews F.I.T.T. principles of aerobic exercise including progression. Written material given at graduation. Flowsheet Row Cardiac Rehab from 03/22/2022 in [facility] Cardiac and Pulmonary Rehab  Education need identified 03/12/22       Education: Resistance Exercise: - Group verbal and visual presentation on the components of exercise prescription. Introduces F.I.T.T principle from ACSM for exercise prescriptions  Reviews F.I.T.T. principles of resistance exercise including progression. Written material given  at graduation.    Education: Exercise & Equipment Safety: - Individual verbal instruction and demonstration of equipment use and safety with use of the equipment. Flowsheet Row Cardiac Rehab from 03/22/2022 in [facility] Cardiac and Pulmonary Rehab  Date 03/12/22  Educator [facility]  Instruction Review Code 1- Verbalizes Understanding       Education: Exercise Physiology & General Exercise Guidelines: - Group  verbal and written instruction with models to review the exercise physiology of the cardiovascular system and associated critical values. Provides general exercise guidelines with specific guidelines to those with heart or lung disease.  Flowsheet Row Cardiac Rehab from 03/22/2022 in [facility] Cardiac and Pulmonary Rehab  Education need identified 03/12/22       Education: Flexibility, Balance, Mind/Body Relaxation: - Group verbal and visual presentation with interactive activity on the components of exercise prescription. Introduces F.I.T.T principle from ACSM for exercise prescriptions. Reviews F.I.T.T. principles of flexibility and balance exercise training including progression. Also discusses the mind body connection.  Reviews various relaxation techniques to help reduce and manage stress (i.e. Deep breathing, progressive muscle relaxation, and visualization). Balance handout provided to take home. Written material given at graduation.   Activity Barriers & Risk Stratification:  Activity Barriers & Cardiac Risk Stratification - 03/12/22 1453       Activity Barriers & Cardiac Risk Stratification   Activity Barriers Back Problems;Arthritis   arth back and shoulders   Cardiac Risk Stratification Moderate             6 Minute Walk:  6 Minute Walk     Row Name 03/12/22 1451         6 Minute Walk   Phase Initial     Distance 1600 feet     Walk Time 6 minutes     # of Rest Breaks 0     MPH 3.03     METS 3.87     RPE 12     VO2 Peak 13.57     Symptoms No     Resting HR 63 bpm     Resting BP 122/64     Resting Oxygen Saturation  96 %     Exercise Oxygen Saturation  during 6 min walk 95 %     Max Ex. HR 102 bpm     Max Ex. BP 144/75     2 Minute Post BP 122/66              Oxygen Initial Assessment:   Oxygen Re-Evaluation:   Oxygen Discharge (Final Oxygen Re-Evaluation):   Initial Exercise Prescription:  Initial Exercise Prescription - 03/12/22 1400        Date of Initial Exercise RX and Referring Provider   Date 03/12/22    Referring Provider Lujean Amel MD      Oxygen   Maintain Oxygen Saturation 88% or higher      Treadmill   MPH 2.7    Grade 1    Minutes 15    METs 3.25      Elliptical   Level 1    Speed 2.5    Minutes 15    METs 3      REL-XR   Level 1    Speed 50    Minutes 15    METs 3      Track   Laps 38    Minutes 15    METs 3.07      Prescription Details   Frequency (times per week) 2  Duration Progress to 30 minutes of continuous aerobic without signs/symptoms of physical distress      Intensity   THRR 40-80% of Max Heartrate 100-137    Ratings of Perceived Exertion 11-13    Perceived Dyspnea 0-4      Progression   Progression Continue to progress workloads to maintain intensity without signs/symptoms of physical distress.      Resistance Training   Training Prescription Yes    Weight 7 lb    Reps 10-15             Perform Capillary Blood Glucose checks as needed.  Exercise Prescription Changes:   Exercise Prescription Changes     Row Name 03/12/22 1400 03/20/22 1000 03/27/22 1500 04/09/22 1400 04/23/22 1000     Response to Exercise   Blood Pressure (Admit) 122/64 -- 108/62 118/60 118/72   Blood Pressure (Exercise) 144/74 -- 148/70 120/58 --   Blood Pressure (Exit) 122/66 -- 102/58 122/62 108/64   Heart Rate (Admit) 63 bpm -- 67 bpm 72 bpm 72 bpm   Heart Rate (Exercise) 102 bpm -- 122 bpm 128 bpm 120 bpm   Heart Rate (Exit) 63 bpm -- 82 bpm 77 bpm 77 bpm   Oxygen Saturation (Admit) 96 % -- -- -- --   Oxygen Saturation (Exercise) 95 % -- -- -- --   Rating of Perceived Exertion (Exercise) 12 -- 15 12 13   $ Symptoms none -- none none none   Comments walk test results -- 3rd full day of exercise -- --   Duration -- -- Continue with 30 min of aerobic exercise without signs/symptoms of physical distress. Continue with 30 min of aerobic exercise without signs/symptoms of physical  distress. Continue with 30 min of aerobic exercise without signs/symptoms of physical distress.   Intensity -- -- THRR unchanged THRR unchanged THRR unchanged     Progression   Progression -- -- Continue to progress workloads to maintain intensity without signs/symptoms of physical distress. Continue to progress workloads to maintain intensity without signs/symptoms of physical distress. Continue to progress workloads to maintain intensity without signs/symptoms of physical distress.   Average METs -- -- 3.48 4.25 4.33     Resistance Training   Training Prescription -- -- Yes Yes Yes   Weight -- -- 7 lb 10 lb 12 lb   Reps -- -- 10-15 10-15 10-15     Interval Training   Interval Training -- -- No No No     Treadmill   MPH -- -- 2.7 3 3.2   Grade -- -- 1 2 3   $ Minutes -- -- 15 15 15   $ METs -- -- 3.44 4.12 4.77     NuStep   Level -- -- -- -- 5   Minutes -- -- -- -- 15   METs -- -- -- -- 4.2     Elliptical   Level -- -- 1 -- 1   Speed -- -- 2.5 -- 2.5   Minutes -- -- 15 -- 15   METs -- -- -- -- 3.7     REL-XR   Level -- -- 3 4 7   $ Minutes -- -- 15 15 15   $ METs -- -- 3.5 5.1 5     Home Exercise Plan   Plans to continue exercise at -- Home (comment)  walking, treadmill, elliptical Home (comment)  walking, treadmill, elliptical Home (comment)  walking, treadmill, elliptical Home (comment)  walking, treadmill, elliptical   Frequency -- Add 2 additional days  to program exercise sessions. Add 2 additional days to program exercise sessions. Add 2 additional days to program exercise sessions. Add 2 additional days to program exercise sessions.   Initial Home Exercises Provided -- 03/20/22 03/20/22 03/20/22 03/20/22     Oxygen   Maintain Oxygen Saturation -- -- 88% or higher 88% or higher 88% or higher    Row Name 05/07/22 1300 05/21/22 1100           Response to Exercise   Blood Pressure (Admit) 112/60 124/60      Blood Pressure (Exercise) 178/68 --      Blood Pressure (Exit)  104/62 132/64      Heart Rate (Admit) 63 bpm 75 bpm      Heart Rate (Exercise) 140 bpm 117 bpm      Heart Rate (Exit) 78 bpm 88 bpm      Rating of Perceived Exertion (Exercise) 16 16      Symptoms none none      Duration Continue with 30 min of aerobic exercise without signs/symptoms of physical distress. Continue with 30 min of aerobic exercise without signs/symptoms of physical distress.      Intensity THRR unchanged THRR unchanged        Progression   Progression Continue to progress workloads to maintain intensity without signs/symptoms of physical distress. Continue to progress workloads to maintain intensity without signs/symptoms of physical distress.      Average METs 4.04 5.1        Resistance Training   Training Prescription Yes Yes      Weight 12 lb 12 lb      Reps 10-15 10-15        Interval Training   Interval Training No No        Treadmill   MPH 3.5 3.5      Grade 4 4      Minutes 15 15      METs 5.61 5.61        NuStep   Level 5 --      Minutes 15 --      METs 3.3 --        Elliptical   Level 3 3      Speed 4.2 4.2      Minutes 15 15      METs 3.7 3.7        REL-XR   Level 5 8      Minutes 15 15      METs 6.4 7.6        Home Exercise Plan   Plans to continue exercise at Home (comment)  walking, treadmill, elliptical Home (comment)  walking, treadmill, elliptical      Frequency Add 2 additional days to program exercise sessions. Add 2 additional days to program exercise sessions.      Initial Home Exercises Provided 03/20/22 03/20/22        Oxygen   Maintain Oxygen Saturation 88% or higher 88% or higher               Exercise Comments:   Exercise Comments     Row Name 03/15/22 1001           Exercise Comments First full day of exercise!  Patient was oriented to gym and equipment including functions, settings, policies, and procedures.  Patient's individual exercise prescription and treatment plan were reviewed.  All starting workloads  were established based on the results of the 6 minute walk test done at initial  orientation visit.  The plan for exercise progression was also introduced and progression will be customized based on patient's performance and goals.                Exercise Goals and Review:   Exercise Goals     Row Name 03/12/22 1501             Exercise Goals   Increase Physical Activity Yes       Intervention Provide advice, education, support and counseling about physical activity/exercise needs.;Develop an individualized exercise prescription for aerobic and resistive training based on initial evaluation findings, risk stratification, comorbidities and participant's personal goals.       Expected Outcomes Long Term: Add in home exercise to make exercise part of routine and to increase amount of physical activity.;Long Term: Exercising regularly at least 3-5 days a week.;Short Term: Attend rehab on a regular basis to increase amount of physical activity.       Increase Strength and Stamina Yes       Intervention Develop an individualized exercise prescription for aerobic and resistive training based on initial evaluation findings, risk stratification, comorbidities and participant's personal goals.;Provide advice, education, support and counseling about physical activity/exercise needs.       Expected Outcomes Short Term: Increase workloads from initial exercise prescription for resistance, speed, and METs.;Long Term: Improve cardiorespiratory fitness, muscular endurance and strength as measured by increased METs and functional capacity (6MWT);Short Term: Perform resistance training exercises routinely during rehab and add in resistance training at home       Able to understand and use rate of perceived exertion (RPE) scale Yes       Intervention Provide education and explanation on how to use RPE scale       Expected Outcomes Long Term:  Able to use RPE to guide intensity level when exercising  independently;Short Term: Able to use RPE daily in rehab to express subjective intensity level       Able to understand and use Dyspnea scale Yes       Intervention Provide education and explanation on how to use Dyspnea scale       Expected Outcomes Short Term: Able to use Dyspnea scale daily in rehab to express subjective sense of shortness of breath during exertion;Long Term: Able to use Dyspnea scale to guide intensity level when exercising independently       Knowledge and understanding of Target Heart Rate Range (THRR) Yes       Intervention Provide education and explanation of THRR including how the numbers were predicted and where they are located for reference       Expected Outcomes Short Term: Able to state/look up THRR;Short Term: Able to use daily as guideline for intensity in rehab;Long Term: Able to use THRR to govern intensity when exercising independently       Able to check pulse independently Yes       Intervention Review the importance of being able to check your own pulse for safety during independent exercise;Provide education and demonstration on how to check pulse in carotid and radial arteries.       Expected Outcomes Short Term: Able to explain why pulse checking is important during independent exercise;Long Term: Able to check pulse independently and accurately       Understanding of Exercise Prescription Yes       Intervention Provide education, explanation, and written materials on patient's individual exercise prescription       Expected Outcomes Short Term:  Able to explain program exercise prescription;Long Term: Able to explain home exercise prescription to exercise independently                Exercise Goals Re-Evaluation :  Exercise Goals Re-Evaluation     Row Name 03/15/22 1001 03/20/22 1001 03/27/22 1508 04/09/22 1409 04/10/22 1011     Exercise Goal Re-Evaluation   Exercise Goals Review Able to understand and use rate of perceived exertion (RPE)  scale;Able to understand and use Dyspnea scale;Knowledge and understanding of Target Heart Rate Range (THRR);Understanding of Exercise Prescription Able to understand and use rate of perceived exertion (RPE) scale;Able to understand and use Dyspnea scale;Knowledge and understanding of Target Heart Rate Range (THRR);Understanding of Exercise Prescription;Increase Physical Activity;Increase Strength and Stamina;Able to check pulse independently Increase Physical Activity;Understanding of Exercise Prescription;Increase Strength and Stamina Increase Physical Activity;Understanding of Exercise Prescription;Increase Strength and Stamina Increase Physical Activity;Understanding of Exercise Prescription;Increase Strength and Stamina   Comments Reviewed RPE scale, THR and program prescription with pt today.  Pt voiced understanding and was given a copy of goals to take home. Reviewed home exercise with pt today.  Pt plans to walk and use personal treadmill and elliptical at home for exercise.  Reviewed THR, pulse, RPE, sign and symptoms, pulse oximetery and when to call 911 or MD.  Also discussed weather considerations and indoor options.  Pt voiced understanding. Gershon Mussel is off to a good start in rehab.  He has completed 3 full day sessions thus far.  He is already up to 3.5 METs on the XR.  We will conitnue to monitor his progress. Gershon Mussel is doing well in rehab. He recently improved his workload on the treadmill to a speed of 3 mph and an incline of 2%. He also improved from 7 lb to 10 lb hand weights for resistance training. We will continue to monitor his progress. tom continues to use his elliptical and treadmill at home,  where he exercises 10 min on elliptical, 20 min on on treadmill. He has been working on the treadmill at rehab more often to condition himself better on it. He exercises 2/3 days outside of rehab.  He is chekcing his HR and states that is gets up ot a max of 100- 124 bpm. He is going to buy a fitness watch.  He is also interested in possibly going to the [employer] to change up some of his routine and have more access to weight machines.   Expected Outcomes Short: Use RPE daily to regulate intensity. Long: Follow program prescription in THR. Short: Start to add in exercise at home Long; Conitnue to improve stamina Short: Continue to attend rehab regularly Long: continue to follow program presrcription Short: Try to use elliptical. Long: continue to improve strength and stamina. Short: Continue to exercise at home, monitor HR Long: continue to exercise independently at home    [place] Name 04/23/22 1051 05/07/22 1357 05/08/22 1019 05/21/22 1130       Exercise Goal Re-Evaluation   Exercise Goals Review Increase Physical Activity;Understanding of Exercise Prescription;Increase Strength and Stamina Increase Physical Activity;Understanding of Exercise Prescription;Increase Strength and Stamina Increase Physical Activity;Understanding of Exercise Prescription;Increase Strength and Stamina Increase Physical Activity;Understanding of Exercise Prescription;Increase Strength and Stamina    Comments Gershon Mussel continues to do well in rehab. He did increase to level 7 on the XR working over 5 METS! He also increaed his workload on the treadmill to a 3.2 speed with a 3% incline. He continues to work on the elliptical 10  minutes each time and hopes to increase his duration progressively. He is also now using 12 lbs for handweights. We will continue to monitor. Gershon Mussel continues to do well in rehab. He has consistently worked at an average MET level above 4 METs. He also improved his workload on te elliptical to level 3 with a speed of 4.2 mph. He also increased his workload on the treadmill to a speed of 3.5 mph and an incline of 4%. We will continue to monitor his progress in the program. Gershon Mussel is doing well in rehab.  He is doing his home exercise.  He usually does 20 min on elliptical and 10 min on treadmill at home twice a week.  He is  looking into finding gym close to home after graduation.  He is looking at maybe joining [employer] since they offer hours around the clock.  He would like [employer] but their hours may not work.  He does not have Silver Sneakers. Gershon Mussel continues to do well in rehab,. He increased his level on the XR to level 8, working over 7.6 METS! He stayed conistent at level 3 on the elliptical and tolerating it well. He has been hitting his THR most sessions he is here. Will continue to monitor.    Expected Outcomes Short: Increase duration on elliptical Long: Continue to increase overall stamina and MET level Short: Conitnue to increase workload on treadmill. Long: Continue to improve strength and stamina. Short: Continue to work to find a gym for after graduation Long: continue to improve stamina Short: Continue to increase workload on elliptical Long: Increase overall MET level and stamina             Discharge Exercise Prescription (Final Exercise Prescription Changes):  Exercise Prescription Changes - 05/21/22 1100       Response to Exercise   Blood Pressure (Admit) 124/60    Blood Pressure (Exit) 132/64    Heart Rate (Admit) 75 bpm    Heart Rate (Exercise) 117 bpm    Heart Rate (Exit) 88 bpm    Rating of Perceived Exertion (Exercise) 16    Symptoms none    Duration Continue with 30 min of aerobic exercise without signs/symptoms of physical distress.    Intensity THRR unchanged      Progression   Progression Continue to progress workloads to maintain intensity without signs/symptoms of physical distress.    Average METs 5.1      Resistance Training   Training Prescription Yes    Weight 12 lb    Reps 10-15      Interval Training   Interval Training No      Treadmill   MPH 3.5    Grade 4    Minutes 15    METs 5.61      Elliptical   Level 3    Speed 4.2    Minutes 15    METs 3.7      REL-XR   Level 8    Minutes 15    METs 7.6      Home Exercise Plan   Plans to continue  exercise at Home (comment)   walking, treadmill, elliptical   Frequency Add 2 additional days to program exercise sessions.    Initial Home Exercises Provided 03/20/22      Oxygen   Maintain Oxygen Saturation 88% or higher             Nutrition:  Target Goals: Understanding of nutrition guidelines, daily intake of sodium <1564m,  cholesterol <243m, calories 30% from fat and 7% or less from saturated fats, daily to have 5 or more servings of fruits and vegetables.  Education: All About Nutrition: -Group instruction provided by verbal, written material, interactive activities, discussions, models, and posters to present general guidelines for heart healthy nutrition including fat, fiber, MyPlate, the role of sodium in heart healthy nutrition, utilization of the nutrition label, and utilization of this knowledge for meal planning. Follow up email sent as well. Written material given at graduation. [facility]  Education need identified 03/12/22       Biometrics:  Pre Biometrics - 03/12/22 1502       Pre Biometrics   Height 6' 0.5" (1.842 m)    Weight 207 lb 12.8 oz (94.3 kg)    Waist Circumference 38.5 inches    Hip Circumference 39 inches    Waist to Hip Ratio 0.99 %    BMI (Calculated) 27.78    Single Leg Stand 2.5 seconds              Nutrition Therapy Plan and Nutrition Goals:  Nutrition Therapy & Goals - 03/12/22 1129       Nutrition Therapy   Diet Heart healthy, low Na    Drug/Food Interactions Statins/Certain Fruits    Protein (specify units) 75-85g    Fiber 30 grams    Whole Grain Foods 3 servings    Saturated Fats 16 max. grams    Fruits and Vegetables 8 servings/day    Sodium 2 grams      Personal Nutrition Goals   Nutrition Goal ST:  review paperwork, include snacks during the day to see how that affects hunger in evening LT: practice MyPlate guidelines, include 5-8 servings or  fruit/vegetables per day    Comments 67y.o. M admitted to cardiac rehab s/p NSTEMI. PMHx includes HTN, HLD, former tobacco use, CAD, a. fib. Relevant medications includes lexapro, MVI with minerals, crestor. B: oatmeal with bananas, walnuts, blueberries - if he gets breakfast out, he gets oatmeal from [employer] L: chicken salad or deli t[employer]on whole wheat bread D: chicken, pork (~1x/week), chooses lean ground meat (~1x/week), fish including salmon. He enjoys potatoes sometimes with his meals as well as non-starchy vegetbles such as broccoli. S: peanut butter - sometimes with fruit. [name] reports feeling hungry towards the end of the day - discussed including some snacks such as yogurt with nuts and fruit or peanut butter with fruit to eat during the day in-between meals. [name] reports not salting any of his food and limiting eating out to no more than 2-3x/week. He will cook with olive oil, but does use some butter with a potato or on toast sparingly. Discussed MyPlate and heart healthy eating.      Intervention Plan   Intervention Prescribe, educate and counsel regarding individualized specific dietary modifications aiming towards targeted core components such as weight, hypertension, lipid management, diabetes, heart failure and other comorbidities.;Nutrition handout(s) given to patient.    Expected Outcomes Short Term Goal: Understand basic principles of dietary content, such as calories, fat, sodium, cholesterol and nutrients.;Short Term Goal: A plan has been developed with personal nutrition goals set during dietitian appointment.;Long Term Goal: Adherence to prescribed nutrition plan.             Nutrition Assessments:  MEDIFICTS Score Key: ?70 Need to make dietary changes  40-70 Heart Healthy Diet ? 40 Therapeutic Level Cholesterol Diet  Flowsheet Row  Cardiac Rehab from 03/12/2022 in [facility] Cardiac and Pulmonary Rehab  Picture Your Plate Total Score on Admission 80      Picture Your  Plate Scores: [id number] Unhealthy dietary pattern with much room for improvement. 41-50 Dietary pattern unlikely to meet recommendations for good health and room for improvement. 51-60 More healthful dietary pattern, with some room for improvement.  >60 Healthy dietary pattern, although there may be some specific behaviors that could be improved.    Nutrition Goals Re-Evaluation:  Nutrition Goals Re-Evaluation     [place] Name 04/10/22 1023 05/08/22 1027           Goals   Nutrition Goal ST:  review paperwork, include snacks during the day to see how that affects hunger in evening LT: practice MyPlate guidelines, include 5-8 servings or fruit/vegetables per day Short: Focus on increasing more talk, continue to add in fruits and veggies Long: Continue to eat heart healthy diet      Comment Gershon Mussel is wanting to lose weight, he has a weight goal of 195 lb His biggest weakness is that he snacks at night and his go to is peanut butter which he knows he exceeds the serving sizes. He typically eats double the serving size and is aware it is calorically dense. Lately, he has been trying to pair it with an apple to increase his fruit and fiber. We talked about alternatively using PB2 and looking up recipes for banana "nice-cream."  He has been snacking more specifically on mixed nuts such as almonds, cashews, etc. He has been drinking Atkin shakes as well. He also claims he is eating more salads and increased his intake on fruit, specifically on blueberries and bananas. Most mornings, he is eating with oatmeal  for breakfast paired with fruits and nuts. He is looking to increase his water intake as he know he slacks on that. Gershon Mussel is doing well in rehab.  He continues to work on his diet.  He is still using the PB2 to help.  He still likes to eat.  He is eating lots of chicken and salads with broccoli.  He will also dd in [place] each week too.  He is also doing well with his fruit.  His favorites are cantelope and  bluberries.  He is also snacking on nuts to help.  He tries to get the unsalted as often as possible      Expected Outcome Short: Focus on increasing more talk, continue to add in fruits and veggies Long: Continue to eat heart healthy diet Short: Continue to make healhty choices for snacks Long: conitnue to eat heart healthy.               Nutrition Goals Discharge (Final Nutrition Goals Re-Evaluation):  Nutrition Goals Re-Evaluation - 05/08/22 1027       Goals   Nutrition Goal Short: Focus on increasing more talk, continue to add in fruits and veggies Long: Continue to eat heart healthy diet    Comment Gershon Mussel is doing well in rehab.  He continues to work on his diet.  He is still using the PB2 to help.  He still likes to eat.  He is eating lots of chicken and salads with broccoli.  He will also dd in [place] each week too.  He is also doing well with his fruit.  His favorites are cantelope and bluberries.  He is also snacking on nuts to help.  He tries to get the unsalted as often as possible  Expected Outcome Short: Continue to make healhty choices for snacks Long: conitnue to eat heart healthy.             Psychosocial: Target Goals: Acknowledge presence or absence of significant depression and/or stress, maximize coping skills, provide positive support system. Participant is able to verbalize types and ability to use techniques and skills needed for reducing stress and depression.   Education: Stress, Anxiety, and Depression - Group verbal and visual presentation to define topics covered.  Reviews how body is impacted by stress, anxiety, and depression.  Also discusses healthy ways to reduce stress and to treat/manage anxiety and depression.  Written material given at graduation.   Education: Sleep Hygiene -Provides group verbal and written instruction about how sleep can affect your health.  Define sleep hygiene, discuss sleep cycles and impact of sleep habits. Review good sleep  hygiene tips.    Initial Review & Psychosocial Screening:  Initial Psych Review & Screening - 02/14/22 0920       Initial Review   Current issues with Current Psychotropic Meds;Current Anxiety/Panic   lexapro is for anxiety, been taking for years. It continues to help.     Family Dynamics   Good Support System? Yes   wife, daughter grandkids,  step kids     Barriers   Psychosocial barriers to participate in program There are no identifiable barriers or psychosocial needs.      Screening Interventions   Interventions Encouraged to exercise;To provide support and resources with identified psychosocial needs;Provide feedback about the scores to participant    Expected Outcomes Short Term goal: Utilizing psychosocial counselor, staff and physician to assist with identification of specific Stressors or current issues interfering with healing process. Setting desired goal for each stressor or current issue identified.;Long Term Goal: Stressors or current issues are controlled or eliminated.;Short Term goal: Identification and review with participant of any Quality of Life or Depression concerns found by scoring the questionnaire.;Long Term goal: The participant improves quality of Life and PHQ9 Scores as seen by post scores and/or verbalization of changes             Quality of Life Scores:   Quality of Life - 03/12/22 1502       Quality of Life   Select Quality of Life      Quality of Life Scores   Health/Function Pre 28 %    Socioeconomic Pre 23.94 %    Psych/Spiritual Pre 30 %    Family Pre 28.8 %    GLOBAL Pre 27.59 %            Scores of 19 and below usually indicate a poorer quality of life in these areas.  A difference of  2-3 points is a clinically meaningful difference.  A difference of 2-3 points in the total score of the Quality of Life Index has been associated with significant improvement in overall quality of life, self-image, physical symptoms, and general health  in studies assessing change in quality of life.  PHQ-9: Review Flowsheet       03/12/2022  Depression screen PHQ 2/9  Decreased Interest 0  Down, Depressed, Hopeless 0  PHQ - 2 Score 0  Altered sleeping 1  Tired, decreased energy 0  Change in appetite 1  Feeling bad or failure about yourself  0  Trouble concentrating 0  Moving slowly or fidgety/restless 0  Suicidal thoughts 0  PHQ-9 Score 2  Difficult doing work/chores Not difficult at all   Interpretation of  Total Score  Total Score Depression Severity:  1-4 = Minimal depression, 5-9 = Mild depression, 10-14 = Moderate depression, 15-19 = Moderately severe depression, 20-27 = Severe depression   Psychosocial Evaluation and Intervention:  Psychosocial Evaluation - 02/14/22 0943       Psychosocial Evaluation & Interventions   Interventions Encouraged to exercise with the program and follow exercise prescription    Comments Gershon Mussel has no barriers to attending the prgoram. He is ready to learn how to manage his heart disease and live as long as he can. He has a history of anxiety and is on Lexapro. He stated that this helps. He has changed jobs and his anxiety is not as bad now. His health care team has kept him on Lexapro"if it is working,don't change it".  [name] lives with his wife. She and his daughter, grandchildren and step children are his support.  He is the support for his daughter and grandchildren. He is ready to get started with the program.    Expected Outcomes STG Gershon Mussel attends all scheduled sessions, he continues to keep anxiety levels low. LTG Gershon Mussel continues his exercise progression and continues to manage any anxiety symptoms after discharge from the program.    Continue Psychosocial Services  Follow up required by staff             Psychosocial Re-Evaluation:  Psychosocial Re-Evaluation     [place] Name 04/06/22 1006 04/10/22 1033 05/08/22 1023         Psychosocial Re-Evaluation   Current issues with None Identified  None Identified None Identified;Current Stress Concerns     Comments Reviewed patient health questionnaire (PHQ-9) with patient for follow up. Previously, patients score indicated signs/symptoms of depression.  Reviewed to see if patient is improving symptom wise while in program.  Score improved and patient states that it is because his work and family life are good. He states exercise has helped him alot as well. Gershon Mussel states he is doing well mentally. States "home life" is good and has no complaints as he has supportive family at home well. He feels he manages his stress well (from what he has) as he keeps busy. 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 He does takes Lexapro that he feels like helps him. His sleep is good. He denies other concerns at this time. Gershon Mussel is doing well in rehab.  He is already planning to find a gym for after graduation.  He wants to do [facility] but worried about hours with his business hours too.  He is staying busy at work, but managing the best he can.  He has busy weeks and calmer weeks.  He has already planned to just stick to smaller jobs now.  He is not sleeping too great recently, but this has been going on a welll.  If he takes Tylenol  PM it helps, but then he feels low in the morning.  He struggles with getting to sleep versus staying asleep.  He also gets up to go to bathroom at night.  We talked about trying different doses of melatonin to help as well.     Expected Outcomes Short: Continue to attend [facility] regularly for regular exercise and social engagement. Long: Continue to improve symptoms and manage a positive mental state. Short: Continue to manage his stress appropriately, notify MD or staff of any changes Long: Continue to maintain positive attitude Short: Try out melatonin again for sleep Long: Continue to not  let stress get to him at work     Interventions Encouraged to attend Cardiac Rehabilitation for the exercise  Encouraged to attend Cardiac Rehabilitation for the exercise Encouraged to attend Cardiac Rehabilitation for the exercise     Continue Psychosocial Services  No Follow up required Follow up required by staff Follow up required by staff              Psychosocial Discharge (Final Psychosocial Re-Evaluation):  Psychosocial Re-Evaluation - 05/08/22 1023       Psychosocial Re-Evaluation   Current issues with None Identified;Current Stress Concerns    Comments Gershon Mussel is doing well in rehab.  He is already planning to find a gym for after graduation.  He wants to do [facility] but worried about hours with his business hours too.  He is staying busy at work, but managing the best he can.  He has busy weeks and calmer weeks.  He has already planned to just stick to smaller jobs now.  He is not sleeping too great recently, but this has been going on a welll.  If he takes Tylenol  PM it helps, but then he feels low in the morning.  He struggles with getting to sleep versus staying asleep.  He also gets up to go to bathroom at night.  We talked about trying different doses of melatonin to help as well.    Expected Outcomes Short: Try out melatonin again for sleep Long: Continue to not let stress get to him at work    Interventions Encouraged to attend Cardiac Rehabilitation for the exercise    Continue Psychosocial Services  Follow up required by staff             Vocational Rehabilitation: Provide vocational rehab assistance to qualifying candidates.   Vocational Rehab Evaluation & Intervention:   Education: Education Goals: Education classes will be provided on a variety of topics geared toward better understanding of heart health and risk factor modification. Participant will state understanding/return demonstration of topics presented as noted by education test scores.  Learning Barriers/Preferences:   General Cardiac Education Topics:  AED/CPR: - Group verbal and written instruction with  the use of models to demonstrate the basic use of the AED with the basic ABC's of resuscitation.   Anatomy and Cardiac Procedures: - Group verbal and visual presentation and models provide information about basic cardiac anatomy and function. Reviews the testing methods done to diagnose heart disease and the outcomes of the test results. Describes the treatment choices: Medical Management, Angioplasty, or Coronary Bypass Surgery for treating various heart conditions including Myocardial Infarction, Angina, Valve Disease, and Cardiac Arrhythmias.  Written material given at graduation.   Medication Safety: - Group verbal and visual instruction to review commonly prescribed medications for heart and lung disease. Reviews the medication, class of the drug, and side effects. Includes the steps to properly store meds and maintain the prescription regimen.  Written material given at graduation. Flowsheet Row Cardiac Rehab from 03/22/2022 in [facility] Cardiac and Pulmonary Rehab  Date 03/15/22  Educator SB  Instruction Review Code 1- Verbalizes Understanding       Intimacy: - Group verbal instruction through game format to discuss how heart and lung disease can affect sexual intimacy. Written material given at graduation..   Know Your Numbers and Heart Failure: - Group verbal and visual instruction to discuss disease risk factors for cardiac and pulmonary disease and treatment options.  Reviews associated critical values for Overweight/Obesity, Hypertension, Cholesterol, and Diabetes.  Discusses basics of heart failure: signs/symptoms and treatments.  Introduces Heart Failure Zone chart for action plan for heart failure.  Written material given at graduation. Flowsheet Row Cardiac Rehab from 03/22/2022 in [facility] Cardiac and Pulmonary Rehab  Education need identified 03/12/22  Date 03/22/22  Educator SB  Instruction Review Code 1- Verbalizes Understanding       Infection Prevention: - Provides  verbal and written material to individual with discussion of infection control including proper hand washing and proper equipment cleaning during exercise session. Flowsheet Row Cardiac Rehab from 03/22/2022 in [facility] Cardiac and Pulmonary Rehab  Date 03/12/22  Educator [facility]  Instruction Review Code 1- Verbalizes Understanding       Falls Prevention: - Provides verbal and written material to individual with discussion of falls prevention and safety. Flowsheet Row Cardiac Rehab from 03/22/2022 in [facility] Cardiac and Pulmonary Rehab  Date 02/14/22  Educator SB  Instruction Review Code 1- Verbalizes Understanding       Other: -Provides group and verbal instruction on various topics (see comments)   Knowledge Questionnaire Score:  Knowledge Questionnaire Score - 03/12/22 1503       Knowledge Questionnaire Score   Pre Score 22/26             Core Components/Risk Factors/Patient Goals at Admission:  Personal Goals and Risk Factors at Admission - 03/12/22 1503       Core Components/Risk Factors/Patient Goals on Admission    Weight Management Yes;Weight Loss    Intervention Weight Management: Develop a combined nutrition and exercise program designed to reach desired caloric intake, while maintaining appropriate intake of nutrient and fiber, sodium and fats, and appropriate energy expenditure required for the weight goal.;Weight Management/Obesity: Establish reasonable short term and long term weight goals.;Weight Management: Provide education and appropriate resources to help participant work on and attain dietary goals.    Admit Weight 207 lb 12.8 oz (94.3 kg)    Goal Weight: Short Term 203 lb (92.1 kg)    Goal Weight: Long Term 195 lb (88.5 kg)    Expected Outcomes Short Term: Continue to assess and modify interventions until short term weight is achieved;Long Term: Adherence to nutrition and physical activity/exercise program aimed toward attainment of established weight  goal;Weight Loss: Understanding of general recommendations for a balanced deficit meal plan, which promotes 1-2 lb weight loss per week and includes a negative energy balance of [phone] kcal/d;Understanding recommendations for meals to include 15-35% energy as protein, 25-35% energy from fat, 35-60% energy from carbohydrates, less than 246m of dietary cholesterol, 20-35 gm of total fiber daily;Understanding of distribution of calorie intake throughout the day with the consumption of 4-5 meals/snacks    Hypertension Yes    Intervention Provide education on lifestyle modifcations including regular physical activity/exercise, weight management, moderate sodium restriction and increased consumption of fresh fruit, vegetables, and low fat dairy, alcohol moderation, and smoking cessation.;Monitor prescription use compliance.    Expected Outcomes Short Term: Continued assessment and intervention until BP is < 140/973mHG in hypertensive participants. < 130/8073mG in hypertensive participants with diabetes, heart failure or chronic kidney disease.;Long Term: Maintenance of blood pressure at goal levels.    Lipids Yes    Intervention Provide education and support for participant on nutrition & aerobic/resistive exercise along with prescribed medications to achieve LDL <72m35mDL >40mg44m Expected Outcomes Short Term: Participant states understanding of desired cholesterol values and is compliant with medications prescribed. Participant is following exercise prescription and nutrition guidelines.;Long Term: Cholesterol  controlled with medications as prescribed, with individualized exercise RX and with personalized nutrition plan. Value goals: LDL < 33m, HDL > 40 mg.             Education:Diabetes - Individual verbal and written instruction to review signs/symptoms of diabetes, desired ranges of glucose level fasting, after meals and with exercise. Acknowledge that pre and post exercise glucose checks will  be done for 3 sessions at entry of program.   Core Components/Risk Factors/Patient Goals Review:   Goals and Risk Factor Review     Row Name 04/10/22 1028 05/08/22 1029           Core Components/Risk Factors/Patient Goals Review   Personal Goals Review Weight Management/Obesity;Hypertension;Lipids Weight Management/Obesity;Hypertension;Lipids      Review goal weight is [phone]. currentl 208 lb. Checking BP at home, ranging 110/7os. Checking HR 60s rest, 100-120s for exercise. BP at rehab good too.Taking all medications as precsrbied. lightheaded, cut ami in hald but still sympspatic. TGershon Musselis doing well in rehab.  His weight is holding steady.  He wants to continue to lose, but he does like to eat.  His pressures are doing well and he continues to check them at home.  They did decrease his blood pressure meds and they has cut back on his dizziness.  They are also thinking about dropping the amiodarone overall as long as rhythm is doing well.  His meds are doing well overall.      Expected Outcomes -- Short: Conitnue to work on weight loss long: conitnue to monitor risk factors.               Core Components/Risk Factors/Patient Goals at Discharge (Final Review):   Goals and Risk Factor Review - 05/08/22 1029       Core Components/Risk Factors/Patient Goals Review   Personal Goals Review Weight Management/Obesity;Hypertension;Lipids    Review TGershon Musselis doing well in rehab.  His weight is holding steady.  He wants to continue to lose, but he does like to eat.  His pressures are doing well and he continues to check them at home.  They did decrease his blood pressure meds and they has cut back on his dizziness.  They are also thinking about dropping the amiodarone overall as long as rhythm is doing well.  His meds are doing well overall.    Expected Outcomes Short: Conitnue to work on weight loss long: conitnue to monitor risk factors.             ITP Comments:  ITP Comments     Row  Name 02/14/22 0941 03/12/22 1451 03/15/22 1001 03/28/22 0943 04/25/22 0856   ITP Comments Virtual orientation call completed today. he has an appointment on Date: 02/26/2022  for EP eval and gym Orientation.  [facility]       TGershon Musseltook time researching Cardiac Rehab before committing to the program. Completed 6MWT and gym orientation. Initial ITP created and sent for review to Dr. MEmily Filbert Medical Director. First full day of exercise!  Patient was oriented to gym and equipment including functions, settings, policies, and procedures.  Patient's individual exercise prescription and treatment plan were reviewed.  All starting workloads were established based on the results of the 6 minute walk test done at initial orientation visit.  The plan for exercise progression was also introduced and progression will be customized based on patient's performance and goals. 30 Day review completed. Medical Director ITP  review done, changes made as directed, and signed approval by Medical Director.    new to program 30 Day review completed. Medical Director ITP review done, changes made as directed, and signed approval by Medical Director.    [place] Name 05/23/22 0751           ITP Comments 30 day review completed. ITP sent to Dr. Emily Filbert, Medical Director of Cardiac Rehab. Continue with ITP unless changes are made by physician.                Comments: .30 day review

## 2022-05-24 ENCOUNTER — Encounter: Payer: Medicare Other | Admitting: *Deleted

## 2022-05-24 DIAGNOSIS — I214 Non-ST elevation (NSTEMI) myocardial infarction: Secondary | ICD-10-CM

## 2022-05-24 DIAGNOSIS — Z955 Presence of coronary angioplasty implant and graft: Secondary | ICD-10-CM | POA: Diagnosis not present

## 2022-05-24 NOTE — Progress Notes (Signed)
Daily Session Note  Patient Details  Name: Tommy Rowe MRN: [id number] Date of Birth: Aug 14, 1955 Referring Provider:   Flowsheet Row Cardiac Rehab from 03/12/2022 in [facility] Cardiac and Pulmonary Rehab  Referring Provider Lujean Amel MD       Encounter Date: 05/24/2022  Check In:  Session Check In - 05/24/22 1002       Check-In   Supervising physician immediately available to respond to emergencies See telemetry face sheet for immediately available ER MD    Location [facility]-Cardiac & Pulmonary Rehab    Staff Present Darlyne Russian, RN, ADN;Jessica Luan Pulling, MA, RCEP, CCRP, Bertram Gala, MS, ACSM CEP, Exercise Physiologist    Virtual Visit No    Medication changes reported     No    Fall or balance concerns reported    No    Warm-up and Cool-down Performed on first and last piece of equipment    Resistance Training Performed Yes    VAD Patient? No    PAD/SET Patient? No      Pain Assessment   Currently in Pain? No/denies                Social History   Tobacco Use  Smoking Status Former   Types: Cigarettes   Quit date: 04/02/1988   Years since quitting: 34.1   Passive exposure: Past  Smokeless Tobacco Never    Goals Met:  Independence with exercise equipment Exercise tolerated well No report of concerns or symptoms today Strength training completed today  Goals Unmet:  Not Applicable  Comments: Pt able to follow exercise prescription today without complaint.  Will continue to monitor for progression.    Dr. Emily Filbert is Medical Director for [place].  Dr. Ottie Glazier is Medical Director for [facility] Pulmonary Rehabilitation.

## 2022-05-25 ENCOUNTER — Encounter: Payer: Medicare Other | Admitting: *Deleted

## 2022-05-25 DIAGNOSIS — Z955 Presence of coronary angioplasty implant and graft: Secondary | ICD-10-CM

## 2022-05-25 DIAGNOSIS — I214 Non-ST elevation (NSTEMI) myocardial infarction: Secondary | ICD-10-CM

## 2022-05-25 NOTE — Progress Notes (Signed)
Daily Session Note  Patient Details  Name: Tommy Rowe MRN: [id number] Date of Birth: [birth year] Referring Provider:   Flowsheet Row Cardiac Rehab from 03/12/2022 in [facility] Cardiac and Pulmonary Rehab  Referring Provider Lujean Amel MD       Encounter Date: 05/25/2022  Check In:  Session Check In - 05/25/22 1025       Check-In   Supervising physician immediately available to respond to emergencies See telemetry face sheet for immediately available ER MD    Location [facility]-Cardiac & Pulmonary Rehab    Staff Present Alberteen Sam, MA, RCEP, CCRP, CCET;Joseph Kinsman, RCP,RRT,BSRT;Other   Darel Hong, RN BSN   Virtual Visit No    Medication changes reported     No    Fall or balance concerns reported    No    Tobacco Cessation No Change    Warm-up and Cool-down Performed on first and last piece of equipment    Resistance Training Performed Yes    VAD Patient? No    PAD/SET Patient? No      Pain Assessment   Currently in Pain? No/denies                Social History   Tobacco Use  Smoking Status Former   Types: Cigarettes   Quit date: 04/02/1988   Years since quitting: 34.1   Passive exposure: Past  Smokeless Tobacco Never    Goals Met:  Independence with exercise equipment Exercise tolerated well No report of concerns or symptoms today Strength training completed today  Goals Unmet:  Not Applicable  Comments: Pt able to follow exercise prescription today without complaint.  Will continue to monitor for progression.    Dr. Emily Filbert is Medical Director for [place].  Dr. Ottie Glazier is Medical Director for [facility] Pulmonary Rehabilitation.

## 2022-05-29 ENCOUNTER — Encounter: Payer: Medicare Other | Admitting: *Deleted

## 2022-05-29 DIAGNOSIS — Z955 Presence of coronary angioplasty implant and graft: Secondary | ICD-10-CM | POA: Diagnosis not present

## 2022-05-29 DIAGNOSIS — I214 Non-ST elevation (NSTEMI) myocardial infarction: Secondary | ICD-10-CM

## 2022-05-29 NOTE — Progress Notes (Signed)
Daily Session Note  Patient Details  Name: Tommy Rowe MRN: [id number] Date of Birth: 1955/09/25 Referring Provider:   Flowsheet Row Cardiac Rehab from 03/12/2022 in [facility] Cardiac and Pulmonary Rehab  Referring Provider Lujean Amel MD       Encounter Date: 05/29/2022  Check In:  Session Check In - 05/29/22 0943       Check-In   Supervising physician immediately available to respond to emergencies See telemetry face sheet for immediately available ER MD    Location [facility]Card[facility] Pul[facility] Re[facility]    Staff Present Renita Papa, RN BSN;Jessica Luan [place]ing, MA, RCEP, CCRP, Bertram Gala, MS, ACSM CEP, Exercise Physiologist    Virtual Visit No    Medication changes reported     No    Fall or balance concerns reported    No    Warm-up and Cool-down Performed on first and last piece of equipment    Resistance Training Performed Yes    VAD Patient? No    PAD/SET Patient? No      Pain Assessment   Currently in Pain? No/denies                Social History   Tobacco Use  Smoking Status Former   Types: Cigarettes   Quit date: 04/02/1988   Years since quitting: 34.1   Passive exposure: Past  Smokeless Tobacco Never    Goals Met:  Independence with exercise equipment Exercise tolerated well No report of concerns or symptoms today Strength training completed today  Goals Unmet:  Not Applicable  Comments: Pt able to follow exercise prescription today without complaint.  Will continue to monitor for progression.    Dr. Emily Filbert is Medical Director for [place].  Dr. Ottie Glazier is Medical Director for [facility] Pulmonary Rehabilitation.

## 2022-05-31 ENCOUNTER — Encounter: Payer: Medicare Other | Admitting: *Deleted

## 2022-05-31 DIAGNOSIS — Z955 Presence of coronary angioplasty implant and graft: Secondary | ICD-10-CM

## 2022-05-31 DIAGNOSIS — I214 Non-ST elevation (NSTEMI) myocardial infarction: Secondary | ICD-10-CM

## 2022-05-31 NOTE — Progress Notes (Signed)
Daily Session Note  Patient Details  Name: Tommy Rowe MRN: [id number] Date of Birth: 1955/05/06 Referring Provider:   Flowsheet Row Cardiac Rehab from 03/12/2022 in [facility] Cardiac and Pulmonary Rehab  Referring Provider Lujean Amel MD       Encounter Date: 05/31/2022  Check In:  Session Check In - 05/31/22 1006       Check-In   Supervising physician immediately available to respond to emergencies See telemetry face sheet for immediately available ER MD    Location [facility] Pul[facility] Re[facility]    Staff Present Alberteen Sam, MA, RCEP, CCRP, Bertram Gala, MS, ACSM CEP, Exercise Physiologist;Laureen Owens Shark, BS, RRT, CPFT;Megan Tamala Julian, RN, [place];Other   Darel Hong RN BSN   Virtual Visit No    Medication changes reported     No    Fall or balance concerns reported    No    Warm-up and Cool-down Performed on first and last piece of equipment    Resistance Training Performed Yes    VAD Patient? No    PAD/SET Patient? No      Pain Assessment   Currently in Pain? No/denies                Social History   Tobacco Use  Smoking Status Former   Types: Cigarettes   Quit date: 04/02/1988   Years since quitting: 34.1   Passive exposure: Past  Smokeless Tobacco Never    Goals Met:  Independence with exercise equipment Exercise tolerated well No report of concerns or symptoms today Strength training completed today  Goals Unmet:  Not Applicable  Comments: Pt able to follow exercise prescription today without complaint.  Will continue to monitor for progression.    Dr. Emily Filbert is Medical Director for [place].  Dr. Ottie Glazier is Medical Director for [facility] Pulmonary Rehabilitation.

## 2022-06-01 ENCOUNTER — Encounter: Payer: Medicare Other | Attending: Internal Medicine | Admitting: *Deleted

## 2022-06-01 DIAGNOSIS — I214 Non-ST elevation (NSTEMI) myocardial infarction: Secondary | ICD-10-CM | POA: Diagnosis present

## 2022-06-01 DIAGNOSIS — Z955 Presence of coronary angioplasty implant and graft: Secondary | ICD-10-CM | POA: Diagnosis present

## 2022-06-01 DIAGNOSIS — Z48812 Encounter for surgical aftercare following surgery on the circulatory system: Secondary | ICD-10-CM | POA: Insufficient documentation

## 2022-06-01 DIAGNOSIS — I252 Old myocardial infarction: Secondary | ICD-10-CM | POA: Diagnosis not present

## 2022-06-01 NOTE — Progress Notes (Signed)
Daily Session Note  Patient Details  Name: Tommy Rowe MRN: [id number] Date of Birth: 1955/08/09 Referring Provider:   Flowsheet Row Cardiac Rehab from 03/12/2022 in [facility] Cardiac and Pulmonary Rehab  Referring Provider Lujean Amel MD       Encounter Date: 06/01/2022  Check In:  Session Check In - 06/01/22 0959       Check-In   Supervising physician immediately available to respond to emergencies See telemetry face sheet for immediately available ER MD    Location [facility]Card[facility] Pulmonary Rehab    Staff Present Darlyne Russian, RN, ADN;Jessica Luan Pulling, MA, RCEP, CCRP, CCET;Joseph [place], [place]    Virtual Visit No    Medication changes reported     No    Fall or balance concerns reported    No    Warm-up and Cool-down Performed on first and last piece of equipment    Resistance Training Performed Yes    VAD Patient? No    PAD/SET Patient? No      Pain Assessment   Currently in Pain? No/denies                Social History   Tobacco Use  Smoking Status Former   Types: Cigarettes   Quit date: 04/02/1988   Years since quitting: 34.1   Passive exposure: Past  Smokeless Tobacco Never    Goals Met:  Independence with exercise equipment Exercise tolerated well No report of concerns or symptoms today Strength training completed today  Goals Unmet:  Not Applicable  Comments: Pt able to follow exercise prescription today without complaint.  Will continue to monitor for progression.    Dr. Emily Filbert is Medical Director for [place].  Dr. Ottie Glazier is Medical Director for [facility] Pulmonary Rehabilitation.

## 2022-06-05 ENCOUNTER — Encounter: Payer: Medicare Other | Admitting: *Deleted

## 2022-06-05 DIAGNOSIS — Z955 Presence of coronary angioplasty implant and graft: Secondary | ICD-10-CM

## 2022-06-05 DIAGNOSIS — I252 Old myocardial infarction: Secondary | ICD-10-CM | POA: Diagnosis not present

## 2022-06-05 DIAGNOSIS — I214 Non-ST elevation (NSTEMI) myocardial infarction: Secondary | ICD-10-CM

## 2022-06-05 NOTE — Progress Notes (Signed)
Daily Session Note  Patient Details  Name: Tommy Rowe MRN: [id number] Date of Birth: 01-Aug-1955 Referring Provider:   Flowsheet Row Cardiac Rehab from 03/12/2022 in [facility] Cardiac and Pulmonary Rehab  Referring Provider Lujean Amel MD       Encounter Date: 06/05/2022  Check In:  Session Check In - 06/05/22 1037       Check-In   Supervising physician immediately available to respond to emergencies See telemetry face sheet for immediately available ER MD    Location [facility]Card[facility] Pul[facility] Re[facility]    Staff Present Heath Lark, RN, BSN, CCRP;Jessica [place], MA, RCEP, CCRP, Bertram Gala, MS, ACSM CEP, Exercise Physiologist    Virtual Visit No    Medication changes reported     No    Fall or balance concerns reported    No    Warm-up and Cool-down Performed on first and last piece of equipment    Resistance Training Performed Yes    VAD Patient? No    PAD/SET Patient? No      Pain Assessment   Currently in Pain? No/denies                Social History   Tobacco Use  Smoking Status Former   Types: Cigarettes   Quit date: 04/02/1988   Years since quitting: 34.1   Passive exposure: Past  Smokeless Tobacco Never    Goals Met:  Independence with exercise equipment Exercise tolerated well No report of concerns or symptoms today  Goals Unmet:  Not Applicable  Comments: Pt able to follow exercise prescription today without complaint.  Will continue to monitor for progression.    Dr. Emily Filbert is Medical Director for [place].  Dr. Ottie Glazier is Medical Director for [facility] Pulmonary Rehabilitation.

## 2022-06-07 ENCOUNTER — Encounter: Payer: Medicare Other | Admitting: *Deleted

## 2022-06-07 VITALS — Ht 72.5 in | Wt 207.1 lb

## 2022-06-07 DIAGNOSIS — I252 Old myocardial infarction: Secondary | ICD-10-CM | POA: Diagnosis not present

## 2022-06-07 DIAGNOSIS — I214 Non-ST elevation (NSTEMI) myocardial infarction: Secondary | ICD-10-CM

## 2022-06-07 DIAGNOSIS — Z955 Presence of coronary angioplasty implant and graft: Secondary | ICD-10-CM

## 2022-06-07 NOTE — Progress Notes (Signed)
Daily Session Note  Patient Details  Name: SAHAN KIDDER MRN: [id number] Date of Birth: [birth year] Referring Provider:   Flowsheet Row Cardiac Rehab from 03/12/2022 in [facility] Cardiac and Pulmonary Rehab  Referring Provider Lujean Amel MD       Encounter Date: 06/07/2022  Check In:  Session Check In - 06/07/22 1042       Check-In   Supervising physician immediately available to respond to emergencies See telemetry face sheet for immediately available ER MD    Location [facility]Cardiac [facility] Pulmonary Rehab    Staff Present Darlyne Russian, RN, ADN;Jessica Luan [place], MA, RCEP, CCRP, Bertram Gala, MS, ACSM CEP, Exercise Physiologist    Virtual Visit No    Medication changes reported     No    Fall or balance concerns reported    No    Warm-up and Cool-down Performed on first and last piece of equipment    Resistance Training Performed Yes    VAD Patient? No    PAD/SET Patient? No      Pain Assessment   Currently in Pain? No/denies                Social History   Tobacco Use  Smoking Status Former   Types: Cigarettes   Quit date: 04/02/1988   Years since quitting: 34.2   Passive exposure: Past  Smokeless Tobacco Never    Goals Met:  Independence with exercise equipment Exercise tolerated well No report of concerns or symptoms today Strength training completed today  Goals Unmet:  Not Applicable  Comments: Pt able to follow exercise prescription today without complaint.  Will continue to monitor for progression.   [place] Name 03/12/22 1451 06/07/22 1033       6 Minute Walk   Phase Initial Discharge    Distance 1600 feet 2040 feet    Distance % Change -- 27.5 %    Distance Feet Change -- 440 ft    Walk Time 6 minutes 6 minutes    # of Rest Breaks 0 0    MPH 3.03 3.86    METS 3.87 4.97    RPE 12 12    VO2 Peak 13.57 17.38    Symptoms No No    Resting HR 63 bpm 70 bpm    Resting BP 122/64 122/70    Resting Oxygen Saturation  96 % 95  %    Exercise Oxygen Saturation  during 6 min walk 95 % 97 %    Max Ex. HR 102 bpm 128 bpm    Max Ex. BP 144/75 148/76    2 Minute Post BP 122/66 --               Dr. Emily Filbert is Medical Director for [place].  Dr. Ottie Glazier is Medical Director for [facility] Pulmonary Rehabilitation.

## 2022-06-07 NOTE — Patient Instructions (Signed)
Discharge Patient Instructions  Patient Details  Name: Tommy Rowe MRN: [id number] Date of Birth: [birth year] Referring Provider:  Carron Curie Urge*   Number of Visits: 42  Reason for Discharge:  Patient reached a stable level of exercise. Patient independent in their exercise. Patient has met program and personal goals.  Smoking History:  Social History   Tobacco Use  Smoking Status Former   Types: Cigarettes   Quit date: 04/02/1988   Years since quitting: 34.2   Passive exposure: Past  Smokeless Tobacco Never    Diagnosis:  No diagnosis found.  Initial Exercise Prescription:  Initial Exercise Prescription - 03/12/22 1400       Date of Initial Exercise RX and Referring Provider   Date 03/12/22    Referring Provider Lujean Amel MD      Oxygen   Maintain Oxygen Saturation 88% or higher      Treadmill   MPH 2.7    Grade 1    Minutes 15    METs 3.25      Elliptical   Level 1    Speed 2.5    Minutes 15    METs 3      REL-XR   Level 1    Speed 50    Minutes 15    METs 3      Track   Laps 38    Minutes 15    METs 3.07      Prescription Details   Frequency (times per week) 2    Duration Progress to 30 minutes of continuous aerobic without signs/symptoms of physical distress      Intensity   THRR 40-80% of Max Heartrate 100-137    Ratings of Perceived Exertion 11-13    Perceived Dyspnea 0-4      Progression   Progression Continue to progress workloads to maintain intensity without signs/symptoms of physical distress.      Resistance Training   Training Prescription Yes    Weight 7 lb    Reps 10-15             Discharge Exercise Prescription (Final Exercise Prescription Changes):  Exercise Prescription Changes - 06/04/22 1300       Response to Exercise   Blood Pressure (Admit) 122/64    Blood Pressure (Exit) 114/66    Heart Rate (Admit) 72 bpm    Heart Rate (Exercise) 118 bpm    Heart Rate (Exit) 78 bpm    Rating of  Perceived Exertion (Exercise) 15    Symptoms none    Duration Continue with 30 min of aerobic exercise without signs/symptoms of physical distress.    Intensity THRR unchanged      Progression   Progression Continue to progress workloads to maintain intensity without signs/symptoms of physical distress.    Average METs 5.12      Resistance Training   Training Prescription Yes    Weight 12 lb    Reps 10-15      Interval Training   Interval Training No      Treadmill   MPH 3.5    Grade 4    Minutes 15    METs 5.61      Elliptical   Level 3    Speed 4.2    Minutes 15    METs 3.7      REL-XR   Level 7    Minutes 15    METs 6      Home Exercise Plan   Plans  to continue exercise at Home (comment)   walking, treadmill, elliptical   Frequency Add 2 additional days to program exercise sessions.    Initial Home Exercises Provided 03/20/22      Oxygen   Maintain Oxygen Saturation 88% or higher             Functional Capacity:  6 Minute Walk     Row Name 03/12/22 1451 06/07/22 1033       6 Minute Walk   Phase Initial Discharge    Distance 1600 feet 2040 feet    Distance % Change -- 27.5 %    Distance Feet Change -- 440 ft    Walk Time 6 minutes 6 minutes    # of Rest Breaks 0 0    MPH 3.03 3.86    METS 3.87 4.97    RPE 12 12    VO2 Peak 13.57 17.38    Symptoms No No    Resting HR 63 bpm 70 bpm    Resting BP 122/64 122/70    Resting Oxygen Saturation  96 % 95 %    Exercise Oxygen Saturation  during 6 min walk 95 % 97 %    Max Ex. HR 102 bpm 128 bpm    Max Ex. BP 144/75 148/76    2 Minute Post BP 122/66 --            Nutrition & Weight - Outcomes:  Pre Biometrics - 03/12/22 1502       Pre Biometrics   Height 6' 0.5" (1.842 m)    Weight 207 lb 12.8 oz (94.3 kg)    Waist Circumference 38.5 inches    Hip Circumference 39 inches    Waist to Hip Ratio 0.99 %    BMI (Calculated) 27.78    Single Leg Stand 2.5 seconds             Post  Biometrics - 06/07/22 1034        Post  Biometrics   Height 6' 0.5" (1.842 m)    Weight 207 lb 1.6 oz (93.9 kg)    Waist Circumference 38.5 inches    Hip Circumference 39.5 inches    Waist to Hip Ratio 0.97 %    BMI (Calculated) 27.69    Single Leg Stand 30 seconds             Nutrition:  Nutrition Therapy & Goals - 03/12/22 1129       Nutrition Therapy   Diet Heart healthy, low Na    Drug/Food Interactions Statins/Certain Fruits    Protein (specify units) 75-85g    Fiber 30 grams    Whole Grain Foods 3 servings    Saturated Fats 16 max. grams    Fruits and Vegetables 8 servings/day    Sodium 2 grams      Personal Nutrition Goals   Nutrition Goal ST:  review paperwork, include snacks during the day to see how that affects hunger in evening LT: practice MyPlate guidelines, include 5-8 servings or fruit/vegetables per day    Comments 67 y.o. M admitted to cardiac rehab s/p NSTEMI. PMHx includes HTN, HLD, former tobacco use, CAD, a. fib. Relevant medications includes lexapro, MVI with minerals, crestor. B: oatmeal with bananas, walnuts, blueberries - if he gets breakfast out, he gets oatmeal from [employer] L: chicken salad or deli [place] on whole wheat bread D: chicken, pork (~1x/week), chooses lean ground meat (~1x/week), fish including salmon. He enjoys potatoes sometimes with his meals as  well as non-starchy vegetbles such as broccoli. S: peanut butter - sometimes with fruit. [name] reports feeling hungry towards the end of the day - discussed including some snacks such as yogurt with nuts and fruit or peanut butter with fruit to eat during the day in-between meals. [name] reports not salting any of his food and limiting eating out to no more than 2-3x/week. He will cook with olive oil, but does use some butter with a potato or on toast sparingly. Discussed MyPlate and heart healthy eating.      Intervention Plan   Intervention Prescribe, educate and counsel regarding individualized  specific dietary modifications aiming towards targeted core components such as weight, hypertension, lipid management, diabetes, heart failure and other comorbidities.;Nutrition handout(s) given to patient.    Expected Outcomes Short Term Goal: Understand basic principles of dietary content, such as calories, fat, sodium, cholesterol and nutrients.;Short Term Goal: A plan has been developed with personal nutrition goals set during dietitian appointment.;Long Term Goal: Adherence to prescribed nutrition plan.           Goals reviewed with patient; copy given to patient.

## 2022-06-08 ENCOUNTER — Encounter: Payer: Medicare Other | Admitting: *Deleted

## 2022-06-08 DIAGNOSIS — I252 Old myocardial infarction: Secondary | ICD-10-CM | POA: Diagnosis not present

## 2022-06-08 DIAGNOSIS — I214 Non-ST elevation (NSTEMI) myocardial infarction: Secondary | ICD-10-CM

## 2022-06-08 DIAGNOSIS — Z955 Presence of coronary angioplasty implant and graft: Secondary | ICD-10-CM

## 2022-06-08 NOTE — Progress Notes (Signed)
Daily Session Note  Patient Details  Name: Tommy Rowe MRN: [id number] Date of Birth: 1955-04-08 Referring Provider:   Flowsheet Row Cardiac Rehab from 03/12/2022 in [facility] Cardiac and Pulmonary Rehab  Referring Provider Lujean Amel MD       Encounter Date: 06/08/2022  Check In:  Session Check In - 06/08/22 1040       Check-In   Supervising physician immediately available to respond to emergencies See telemetry face sheet for immediately available ER MD    Location [facility]-Cardiac & Pulmonary Rehab    Staff Present Heath Lark, RN, BSN, CCRP;Jessica [place], MA, RCEP, CCRP, CCET;Joseph [place], [place]    Virtual Visit No    Medication changes reported     No    Fall or balance concerns reported    No    Warm-up and Cool-down Performed on first and last piece of equipment    Resistance Training Performed Yes    VAD Patient? No    PAD/SET Patient? No      Pain Assessment   Currently in Pain? No/denies                Social History   Tobacco Use  Smoking Status Former   Types: Cigarettes   Quit date: 04/02/1988   Years since quitting: 34.2   Passive exposure: Past  Smokeless Tobacco Never    Goals Met:  Independence with exercise equipment Exercise tolerated well No report of concerns or symptoms today  Goals Unmet:  Not Applicable  Comments: Pt able to follow exercise prescription today without complaint.  Will continue to monitor for progression.    Dr. Emily Filbert is Medical Director for [place].  Dr. Ottie Glazier is Medical Director for [facility] Pulmonary Rehabilitation.

## 2022-06-14 ENCOUNTER — Encounter: Payer: Medicare Other | Admitting: *Deleted

## 2022-06-14 DIAGNOSIS — I252 Old myocardial infarction: Secondary | ICD-10-CM | POA: Diagnosis not present

## 2022-06-14 DIAGNOSIS — I214 Non-ST elevation (NSTEMI) myocardial infarction: Secondary | ICD-10-CM

## 2022-06-14 DIAGNOSIS — Z955 Presence of coronary angioplasty implant and graft: Secondary | ICD-10-CM

## 2022-06-14 NOTE — Progress Notes (Signed)
Daily Session Note  Patient Details  Name: Tommy Rowe MRN: [id number] Date of Birth: 13-Apr-1955 Referring Provider:   Flowsheet Row Cardiac Rehab from 03/12/2022 in [facility] Cardiac and Pulmonary Rehab  Referring Provider Lujean Amel MD       Encounter Date: 06/14/2022  Check In:  Session Check In - 06/14/22 1002       Check-In   Supervising physician immediately available to respond to emergencies See telemetry face sheet for immediately available ER MD    Location [facility]-Cardiac & Pulmonary Rehab    Staff Present Darlyne Russian, RN, ADN;Jessica Luan Pulling, MA, RCEP, CCRP, Bertram Gala, MS, ACSM CEP, Exercise Physiologist;Joseph Tessie [place], [place]    Virtual Visit No    Medication changes reported     No    Fall or balance concerns reported    No    Warm-up and Cool-down Performed on first and last piece of equipment    Resistance Training Performed Yes    VAD Patient? No    PAD/SET Patient? No      Pain Assessment   Currently in Pain? No/denies                Social History   Tobacco Use  Smoking Status Former   Types: Cigarettes   Quit date: 04/02/1988   Years since quitting: 34.2   Passive exposure: Past  Smokeless Tobacco Never    Goals Met:  Independence with exercise equipment Exercise tolerated well No report of concerns or symptoms today Strength training completed today  Goals Unmet:  Not Applicable  Comments: Pt able to follow exercise prescription today without complaint.  Will continue to monitor for progression.    Dr. Emily Filbert is Medical Director for [place].  Dr. Ottie Glazier is Medical Director for [facility] Pulmonary Rehabilitation.

## 2022-06-15 ENCOUNTER — Encounter: Payer: Medicare Other | Admitting: *Deleted

## 2022-06-15 DIAGNOSIS — I252 Old myocardial infarction: Secondary | ICD-10-CM | POA: Diagnosis not present

## 2022-06-15 DIAGNOSIS — I214 Non-ST elevation (NSTEMI) myocardial infarction: Secondary | ICD-10-CM

## 2022-06-15 DIAGNOSIS — Z955 Presence of coronary angioplasty implant and graft: Secondary | ICD-10-CM

## 2022-06-15 NOTE — Progress Notes (Signed)
Daily Session Note  Patient Details  Name: Tommy Rowe MRN: [id number] Date of Birth: 08-25-1955 Referring Provider:   Flowsheet Row Cardiac Rehab from 03/12/2022 in [facility] Cardiac and Pulmonary Rehab  Referring Provider Lujean Amel MD       Encounter Date: 06/15/2022  Check In:  Session Check In - 06/15/22 1032       Check-In   Supervising physician immediately available to respond to emergencies See telemetry face sheet for immediately available ER MD    Location [facility]-Cardiac & Pulmonary Rehab    Staff Present Heath Lark, RN, BSN, CCRP;Jessica [place], MA, RCEP, CCRP, CCET;Joseph [place], [place]    Virtual Visit No    Medication changes reported     No    Fall or balance concerns reported    No    Warm-up and Cool-down Performed on first and last piece of equipment    Resistance Training Performed Yes    VAD Patient? No    PAD/SET Patient? No      Pain Assessment   Currently in Pain? No/denies                Social History   Tobacco Use  Smoking Status Former   Types: Cigarettes   Quit date: 04/02/1988   Years since quitting: 34.2   Passive exposure: Past  Smokeless Tobacco Never    Goals Met:  Independence with exercise equipment Exercise tolerated well No report of concerns or symptoms today  Goals Unmet:  Not Applicable  Comments: Pt able to follow exercise prescription today without complaint.  Will continue to monitor for progression.    Dr. Emily Filbert is Medical Director for [place].  Dr. Ottie Glazier is Medical Director for [facility] Pulmonary Rehabilitation.

## 2022-06-16 ENCOUNTER — Other Ambulatory Visit: Payer: Self-pay

## 2022-06-16 ENCOUNTER — Emergency Department
Admission: EM | Admit: 2022-06-16 | Discharge: 2022-06-16 | Disposition: A | Discharge status: Home / Self Care | Payer: Medicare Other | Attending: Emergency Medicine | Admitting: Emergency Medicine

## 2022-06-16 ENCOUNTER — Encounter: Payer: Self-pay | Admitting: Intensive Care

## 2022-06-16 DIAGNOSIS — R002 Palpitations: Secondary | ICD-10-CM | POA: Diagnosis present

## 2022-06-16 DIAGNOSIS — I480 Paroxysmal atrial fibrillation: Secondary | ICD-10-CM | POA: Diagnosis not present

## 2022-06-16 DIAGNOSIS — Z7901 Long term (current) use of anticoagulants: Secondary | ICD-10-CM | POA: Insufficient documentation

## 2022-06-16 LAB — CBC WITH DIFFERENTIAL/PLATELET
Abs Immature Granulocytes: 0.02 10*3/uL (ref 0.00–0.07)
Basophils Absolute: 0.1 10*3/uL (ref 0.0–0.1)
Basophils Relative: 1 %
Eosinophils Absolute: 0.1 10*3/uL (ref 0.0–0.5)
Eosinophils Relative: 2 %
HCT: 50.2 % (ref 39.0–52.0)
Hemoglobin: 16.8 g/dL (ref 13.0–17.0)
Immature Granulocytes: 0 %
Lymphocytes Relative: 25 %
Lymphs Abs: 1.8 10*3/uL (ref 0.7–4.0)
MCH: 31.5 pg (ref 26.0–34.0)
MCHC: 33.5 g/dL (ref 30.0–36.0)
MCV: 94 fL (ref 80.0–100.0)
Monocytes Absolute: 0.7 10*3/uL (ref 0.1–1.0)
Monocytes Relative: 9 %
Neutro Abs: 4.8 10*3/uL (ref 1.7–7.7)
Neutrophils Relative %: 63 %
Platelets: 165 10*3/uL (ref 150–400)
RBC: 5.34 MIL/uL (ref 4.22–5.81)
RDW: 12.6 % (ref 11.5–15.5)
WBC: 7.5 10*3/uL (ref 4.0–10.5)
nRBC: 0 % (ref 0.0–0.2)

## 2022-06-16 LAB — COMPREHENSIVE METABOLIC PANEL
ALT: 31 U/L (ref 0–44)
AST: 41 U/L (ref 15–41)
Albumin: 4.1 g/dL (ref 3.5–5.0)
Alkaline Phosphatase: 54 U/L (ref 38–126)
Anion gap: 5 (ref 5–15)
BUN: 31 mg/dL — ABNORMAL HIGH (ref 8–23)
CO2: 23 mmol/L (ref 22–32)
Calcium: 8.6 mg/dL — ABNORMAL LOW (ref 8.9–10.3)
Chloride: 109 mmol/L (ref 98–111)
Creatinine, Ser: 0.81 mg/dL (ref 0.61–1.24)
GFR, Estimated: >60 mL/min (ref >60)
Glucose, Bld: 150 mg/dL — ABNORMAL HIGH (ref 70–99)
Potassium: 4.2 mmol/L (ref 3.5–5.1)
Sodium: 137 mmol/L (ref 135–145)
Total Bilirubin: 0.7 mg/dL (ref 0.3–1.2)
Total Protein: 7.3 g/dL (ref 6.5–8.1)

## 2022-06-16 LAB — MAGNESIUM: Magnesium: 2.2 mg/dL (ref 1.7–2.4)

## 2022-06-16 LAB — TROPONIN I (HIGH SENSITIVITY): Troponin I (High Sensitivity): 84 ng/L — ABNORMAL HIGH (ref <18)

## 2022-06-16 MED ORDER — AMIODARONE HCL 200 MG PO TABS: 200.0000 mg | ORAL_TABLET | Freq: Two times a day (BID) | ORAL | 0 refills | Status: DC

## 2022-06-16 MED ORDER — SODIUM CHLORIDE 0.9 % IV BOLUS
500.0000 mL | Freq: Once | INTRAVENOUS | Status: AC
  Administered 2022-06-16: 500 mL via INTRAVENOUS

## 2022-06-16 MED ORDER — ETOMIDATE 2 MG/ML IV SOLN
8.0000 mg | Freq: Once | INTRAVENOUS | Status: DC
  Filled 2022-06-16: qty 10

## 2022-06-16 MED ORDER — ETOMIDATE 2 MG/ML IV SOLN
INTRAVENOUS | Status: AC | PRN
  Administered 2022-06-16: 8 mg via INTRAVENOUS

## 2022-06-16 MED ORDER — AMIODARONE HCL 200 MG PO TABS
200.0000 mg | ORAL_TABLET | Freq: Once | ORAL | Status: AC
  Administered 2022-06-16: 200 mg via ORAL
  Filled 2022-06-16: qty 1

## 2022-06-16 NOTE — ED Triage Notes (Signed)
Patient reports abnormal heart rate that started last night. Reports he had been taking amiodarone for a few months and then the MD took him off of it. Reports some heart pressure

## 2022-06-16 NOTE — Progress Notes (Signed)
Airway standby for cardio version. Pt placed on ETCO2 monitor and Ambu bag at bedside.

## 2022-06-16 NOTE — ED Provider Notes (Signed)
[facility] Provider Note    Event Date/Time   First MD Initiated Contact with Patient 06/16/22 [phone]     (approximate)   History   Atrial Fibrillation   HPI  Tommy Rowe is a 67 y.o. male  here with palpitations. Pt was putting last night when he began to feel palpitations. Felt like his heart was beating quickly and somewhat irregularly. He then began to feel some mild SOB and chest pressure. This has persisted throughout the morning. He checked his HR at home and it was in the 140s, with BP in the 90s. He called Dr. Clayborn Bigness and was told to come to the ED. No fever, chills. No missed medications or anticoagulation - is on Eliquis.       Physical Exam   Triage Vital Signs: ED Triage Vitals  Enc Vitals Group     BP --      Pulse --      Resp --      Temp --      Temp src --      SpO2 --      Weight 06/16/22 0836 208 lb (94.3 kg)     Height 06/16/22 0836 6' 0.52" (1.842 m)     Head Circumference --      Peak Flow --      Pain Score 06/16/22 0835 1     Pain Loc --      Pain Edu? --      Excl. in Door? --     Most recent vital signs: Vitals:   06/16/22 1030 06/16/22 1045  BP: 108/70 105/74  Pulse: 63 60  Resp: 12 15  Temp:    SpO2: 98% 96%     General: Awake, no distress.  CV:  Good peripheral perfusion. Tachycardic, irregularly-irregular rhythm. Resp:  Normal work of breathing.  Abd:  No distention. No tenderness. No rebound or guarding. Other:  No LE edema.   ED Results / Procedures / Treatments   Labs (all labs ordered are listed, but only abnormal results are displayed) Labs Reviewed  COMPREHENSIVE METABOLIC PANEL - Abnormal; Notable for the following components:      Result Value   Glucose, Bld 150 (*)    BUN 31 (*)    Calcium 8.6 (*)    All other components within normal limits  TROPONIN I (HIGH SENSITIVITY) - Abnormal; Notable for the following components:   Troponin I (High Sensitivity) 84 (*)    All other  components within normal limits  CBC WITH DIFFERENTIAL/PLATELET  MAGNESIUM     EKG EKG 1: AFib RVR with VR 155. LBBB. No acute St elevations or depressions. EKG 2: Normal sinus rhythm, VR 79. PR 152, QRS 146, QTc 439. No acute ST elevations or depressions. No ischemia or infarct.   RADIOLOGY    I also independently reviewed and agree with radiologist interpretations.   PROCEDURES:  Critical Care performed: Yes, see critical care procedure note(s)  .Critical Care  Performed by: Duffy Bruce, MD Authorized by: Duffy Bruce, MD   Critical care provider statement:    Critical care time (minutes):  30   Critical care time was exclusive of:  Separately billable procedures and treating other patients   Critical care was necessary to treat or prevent imminent or life-threatening deterioration of the following conditions:  Cardiac failure, circulatory failure and respiratory failure   Critical care was time spent personally by me on the following activities:  Development of  treatment plan with patient or surrogate, discussions with consultants, evaluation of patient's response to treatment, examination of patient, ordering and review of laboratory studies, ordering and review of radiographic studies, ordering and performing treatments and interventions, pulse oximetry, re-evaluation of patient's condition and review of old charts .Cardioversion  Date/Time: 06/16/2022 9:39 AM  Performed by: Duffy Bruce, MD Authorized by: Duffy Bruce, MD   Consent:    Consent obtained:  Written   Consent given by:  Spouse   Risks discussed:  Cutaneous burn, death, induced arrhythmia and pain   Alternatives discussed:  Rate-control medication Pre-procedure details:    Cardioversion basis:  Emergent   Rhythm:  Atrial fibrillation   Electrode placement:  Anterior-posterior Patient sedated: Yes. Refer to sedation procedure documentation for details of sedation.  Attempt one:     Cardioversion mode:  Synchronous   Waveform:  Biphasic   Shock (Joules):  100   Shock outcome:  Conversion to normal sinus rhythm Post-procedure details:    Patient status:  Awake   Patient tolerance of procedure:  Tolerated well, no immediate complications .Sedation  Date/Time: 06/16/2022 9:39 AM  Performed by: Duffy Bruce, MD Authorized by: Duffy Bruce, MD   Consent:    Consent obtained:  Written   Consent given by:  Spouse   Risks discussed:  Allergic reaction, inadequate sedation, nausea, dysrhythmia, vomiting, respiratory compromise necessitating ventilatory assistance and intubation, prolonged sedation necessitating reversal and prolonged hypoxia resulting in organ damage   Alternatives discussed:  Analgesia without sedation Universal protocol:    Immediately prior to procedure, a time out was called: yes   Indications:    Procedure performed:  Cardioversion   Procedure necessitating sedation performed by:  Physician performing sedation Pre-sedation assessment:    Time since last food or drink:  2   NPO status caution: unable to specify NPO status and urgency dictates proceeding with non-ideal NPO status     ASA classification: class 2 - patient with mild systemic disease     Mallampati score:  I - soft palate, uvula, fauces, pillars visible   Neck mobility: normal     Pre-sedation assessments completed and reviewed: airway patency, cardiovascular function, hydration status, mental status, nausea/vomiting, pain level, respiratory function and temperature   Immediate pre-procedure details:    Reassessment: Patient reassessed immediately prior to procedure     Reviewed: vital signs     Verified: bag valve mask available, emergency equipment available, intubation equipment available, IV patency confirmed, oxygen available and reversal medications available   Procedure details (see MAR for exact dosages):    Preoxygenation:  Nasal cannula   Sedation:  Etomidate    Intended level of sedation: deep   Intra-procedure monitoring:  Blood pressure monitoring, cardiac monitor, continuous capnometry, continuous pulse oximetry, frequent vital sign checks and frequent LOC assessments   Intra-procedure events: none     Total Provider sedation time (minutes):  10 Post-procedure details:    Attendance: Constant attendance by certified staff until patient recovered     Recovery: Patient returned to pre-procedure baseline     Post-sedation assessments completed and reviewed: airway patency, cardiovascular function, hydration status, mental status, nausea/vomiting, pain level, respiratory function and temperature     Patient is stable for discharge or admission: yes     Procedure completion:  Tolerated well, no immediate complications     MEDICATIONS ORDERED IN ED: Medications  etomidate (AMIDATE) injection 8 mg (8 mg Intravenous Not Given 06/16/22 0938)  sodium chloride 0.9 % bolus 500 mL (0  mLs Intravenous Stopped 06/16/22 1000)  etomidate (AMIDATE) injection (8 mg Intravenous Given 06/16/22 0901)  amiodarone (PACERONE) tablet 200 mg (200 mg Oral Given 06/16/22 1005)     IMPRESSION / MDM / ASSESSMENT AND PLAN / ED COURSE  I reviewed the triage vital signs and the nursing notes.                              Differential diagnosis includes, but is not limited to, afib RVR, electrolyte abnormality, ischemia/ACS, anemia  Patient's presentation is most consistent with acute presentation with potential threat to life or bodily function.  The patient is on the cardiac monitor to evaluate for evidence of arrhythmia and/or significant heart rate changes  67 yo M here with palpitations. Pt in AFib RVR with BP [id number] systolic on arrival. He was previously on Amiodarone but this had been stopped. He has been adherent with his anticoagulation. Discussed tx options and decision made to sedate and cardiovert given his hypotension, tolerated very well with conversion to NSR.   CBC without leukocytosis. Lytes overall unremarkable. Trop slightly elevated likely from demand but below his previous and he has no CP. Doubt ischemic trigger.  Discussed with Dr. Clayborn Bigness, his Cardiologist. Will place back on a PO amio load and have him f/u in clinic this week.   FINAL CLINICAL IMPRESSION(S) / ED DIAGNOSES   Final diagnoses:  Paroxysmal atrial fibrillation ([place])     Rx / DC Orders   ED Discharge Orders          Ordered    amiodarone (PACERONE) 200 MG tablet  2 times daily        06/16/22 1050             Note:  This document was prepared using Dragon voice recognition software and may include unintentional dictation errors.   Duffy Bruce, MD 06/16/22 [phone]

## 2022-06-16 NOTE — Sedation Documentation (Addendum)
Pt cardioverted at 100 J. Pt converted to Sinus Rhythm. 12 Lead obtained.

## 2022-06-16 NOTE — Discharge Instructions (Signed)
Resume taking Amiodarone at the increased dose of 200 mg twice a day until follow-up with Dr. Clayborn Bigness in the next week.

## 2022-06-19 ENCOUNTER — Encounter: Payer: Medicare Other | Admitting: *Deleted

## 2022-06-19 DIAGNOSIS — I214 Non-ST elevation (NSTEMI) myocardial infarction: Secondary | ICD-10-CM

## 2022-06-19 DIAGNOSIS — I252 Old myocardial infarction: Secondary | ICD-10-CM | POA: Diagnosis not present

## 2022-06-19 DIAGNOSIS — Z955 Presence of coronary angioplasty implant and graft: Secondary | ICD-10-CM

## 2022-06-19 NOTE — Progress Notes (Signed)
Cardiac Individual Treatment Plan  Patient Details  Name: Tommy Rowe MRN: [id number] Date of Birth: 10-21-1955 Referring Provider:   Flowsheet Row Cardiac Rehab from 03/12/2022 in [facility] Cardiac and Pulmonary Rehab  Referring Provider Lujean Amel MD       Initial Encounter Date:  Flowsheet Row Cardiac Rehab from 03/12/2022 in [facility] Cardiac and Pulmonary Rehab  Date 03/12/22       Visit Diagnosis: NSTEMI (non-ST elevation myocardial infarction) [facility])  Status post coronary artery stent placement  Patient's Home Medications on Admission:  Current Outpatient Medications:    amiodarone (PACERONE) 200 MG tablet, Take 1 tablet (200 mg total) by mouth 2 (two) times daily for 14 days. Take 200 mg twice a day until follow-up with Dr. Clayborn Bigness in the next week., Disp: 28 tablet, Rfl: 0   ELIQUIS 5 MG TABS tablet, Take 5 mg by mouth 2 (two) times daily., Disp: , Rfl:    escitalopram (LEXAPRO) 20 MG tablet, Take 20 mg by mouth at bedtime., Disp: , Rfl: 1   losartan-hydrochlorothiazide (HYZAAR) 100-25 MG tablet, Take 1 tablet by mouth daily., Disp: , Rfl:    Multiple Vitamin (MULTIVITAMIN WITH MINERALS) TABS tablet, Take 1 tablet by mouth daily. Centrum Silver for Men 50+, Disp: , Rfl:    nitroGLYCERIN (NITROSTAT) 0.4 MG SL tablet, Place 1 tablet (0.4 mg total) under the tongue every 5 (five) minutes as needed for chest pain., Disp: 1 tablet, Rfl: 12   rosuvastatin (CRESTOR) 20 MG tablet, Take 20 mg by mouth at bedtime., Disp: , Rfl:    triamcinolone cream (KENALOG) 0.1 %, APPLY THIN LAYER TOPICALLY TO THE AFFECTED AREA TWICE DAILY, Disp: , Rfl:  No current facility-administered medications for this visit.  Facility-Administered Medications Ordered in Other Visits:    sodium chloride flush (NS) 0.9 % injection 3 mL, 3 mL, Intravenous, Q12H, Scoggins, Amber, NP  Past Medical History: Past Medical History:  Diagnosis Date   A-fib ([place])    Anxiety    HLD (hyperlipidemia)    HTN  (hypertension)     Tobacco Use: Social History   Tobacco Use  Smoking Status Former   Types: Cigarettes   Quit date: 04/02/1988   Years since quitting: 34.2   Passive exposure: Past  Smokeless Tobacco Never    Labs: Review Flowsheet       Latest Ref Rng & Units 09/17/2021  Labs for ITP Cardiac and Pulmonary Rehab  Cholestrol 0 - 200 mg/dL 109   LDL (calc) 0 - 99 mg/dL 55   HDL-C >40 mg/dL 49   Trlycerides <150 mg/dL 23   Hemoglobin A1c 4.8 - 5.6 % 5.7      Exercise Target Goals: Exercise Program Goal: Individual exercise prescription set using results from initial 6 min walk test and THRR while considering  patient's activity barriers and safety.   Exercise Prescription Goal: Initial exercise prescription builds to 30-45 minutes a day of aerobic activity, 2-3 days per week.  Home exercise guidelines will be given to patient during program as part of exercise prescription that the participant will acknowledge.   Education: Aerobic Exercise: - Group verbal and visual presentation on the components of exercise prescription. Introduces F.I.T.T principle from ACSM for exercise prescriptions.  Reviews F.I.T.T. principles of aerobic exercise including progression. Written material given at graduation. Flowsheet Row Cardiac Rehab from 03/22/2022 in [facility] Cardiac and Pulmonary Rehab  Education need identified 03/12/22       Education: Resistance Exercise: - Group verbal and visual presentation on  the components of exercise prescription. Introduces F.I.T.T principle from ACSM for exercise prescriptions  Reviews F.I.T.T. principles of resistance exercise including progression. Written material given at graduation.    Education: Exercise & Equipment Safety: - Individual verbal instruction and demonstration of equipment use and safety with use of the equipment. Flowsheet Row Cardiac Rehab from 03/22/2022 in [facility] Cardiac and Pulmonary Rehab  Date 03/12/22  Educator [facility]  Instruction  Review Code 1- Verbalizes Understanding       Education: Exercise Physiology & General Exercise Guidelines: - Group verbal and written instruction with models to review the exercise physiology of the cardiovascular system and associated critical values. Provides general exercise guidelines with specific guidelines to those with heart or lung disease.  Flowsheet Row Cardiac Rehab from 03/22/2022 in [facility] Cardiac and Pulmonary Rehab  Education need identified 03/12/22       Education: Flexibility, Balance, Mind/Body Relaxation: - Group verbal and visual presentation with interactive activity on the components of exercise prescription. Introduces F.I.T.T principle from ACSM for exercise prescriptions. Reviews F.I.T.T. principles of flexibility and balance exercise training including progression. Also discusses the mind body connection.  Reviews various relaxation techniques to help reduce and manage stress (i.e. Deep breathing, progressive muscle relaxation, and visualization). Balance handout provided to take home. Written material given at graduation.   Activity Barriers & Risk Stratification:  Activity Barriers & Cardiac Risk Stratification - 03/12/22 1453       Activity Barriers & Cardiac Risk Stratification   Activity Barriers Back Problems;Arthritis   arth back and shoulders   Cardiac Risk Stratification Moderate             6 Minute Walk:  6 Minute Walk     Row Name 03/12/22 1451 06/07/22 1033       6 Minute Walk   Phase Initial Discharge    Distance 1600 feet 2040 feet    Distance % Change -- 27.5 %    Distance Feet Change -- 440 ft    Walk Time 6 minutes 6 minutes    # of Rest Breaks 0 0    MPH 3.03 3.86    METS 3.87 4.97    RPE 12 12    VO2 Peak 13.57 17.38    Symptoms No No    Resting HR 63 bpm 70 bpm    Resting BP 122/64 122/70    Resting Oxygen Saturation  96 % 95 %    Exercise Oxygen Saturation  during 6 min walk 95 % 97 %    Max Ex. HR 102 bpm 128 bpm     Max Ex. BP 144/75 148/76    2 Minute Post BP 122/66 --             Oxygen Initial Assessment:   Oxygen Re-Evaluation:   Oxygen Discharge (Final Oxygen Re-Evaluation):   Initial Exercise Prescription:  Initial Exercise Prescription - 03/12/22 1400       Date of Initial Exercise RX and Referring Provider   Date 03/12/22    Referring Provider Lujean Amel MD      Oxygen   Maintain Oxygen Saturation 88% or higher      Treadmill   MPH 2.7    Grade 1    Minutes 15    METs 3.25      Elliptical   Level 1    Speed 2.5    Minutes 15    METs 3      REL-XR   Level 1  Speed 50    Minutes 15    METs 3      Track   Laps 38    Minutes 15    METs 3.07      Prescription Details   Frequency (times per week) 2    Duration Progress to 30 minutes of continuous aerobic without signs/symptoms of physical distress      Intensity   THRR 40-80% of Max Heartrate 100-137    Ratings of Perceived Exertion 11-13    Perceived Dyspnea 0-4      Progression   Progression Continue to progress workloads to maintain intensity without signs/symptoms of physical distress.      Resistance Training   Training Prescription Yes    Weight 7 lb    Reps 10-15             Perform Capillary Blood Glucose checks as needed.  Exercise Prescription Changes:   Exercise Prescription Changes     Row Name 03/12/22 1400 03/20/22 1000 03/27/22 1500 04/09/22 1400 04/23/22 1000     Response to Exercise   Blood Pressure (Admit) 122/64 -- 108/62 118/60 118/72   Blood Pressure (Exercise) 144/74 -- 148/70 120/58 --   Blood Pressure (Exit) 122/66 -- 102/58 122/62 108/64   Heart Rate (Admit) 63 bpm -- 67 bpm 72 bpm 72 bpm   Heart Rate (Exercise) 102 bpm -- 122 bpm 128 bpm 120 bpm   Heart Rate (Exit) 63 bpm -- 82 bpm 77 bpm 77 bpm   Oxygen Saturation (Admit) 96 % -- -- -- --   Oxygen Saturation (Exercise) 95 % -- -- -- --   Rating of Perceived Exertion (Exercise) 12 -- 15 12 13     Symptoms none -- none none none   Comments walk test results -- 3rd full day of exercise -- --   Duration -- -- Continue with 30 min of aerobic exercise without signs/symptoms of physical distress. Continue with 30 min of aerobic exercise without signs/symptoms of physical distress. Continue with 30 min of aerobic exercise without signs/symptoms of physical distress.   Intensity -- -- THRR unchanged THRR unchanged THRR unchanged     Progression   Progression -- -- Continue to progress workloads to maintain intensity without signs/symptoms of physical distress. Continue to progress workloads to maintain intensity without signs/symptoms of physical distress. Continue to progress workloads to maintain intensity without signs/symptoms of physical distress.   Average METs -- -- 3.48 4.25 4.33     Resistance Training   Training Prescription -- -- Yes Yes Yes   Weight -- -- 7 lb 10 lb 12 lb   Reps -- -- 10-15 10-15 10-15     Interval Training   Interval Training -- -- No No No     Treadmill   MPH -- -- 2.7 3 3.2   Grade -- -- 1 2 3    Minutes -- -- 15 15 15    METs -- -- 3.44 4.12 4.77     NuStep   Level -- -- -- -- 5   Minutes -- -- -- -- 15   METs -- -- -- -- 4.2     Elliptical   Level -- -- 1 -- 1   Speed -- -- 2.5 -- 2.5   Minutes -- -- 15 -- 15   METs -- -- -- -- 3.7     REL-XR   Level -- -- 3 4 7    Minutes -- -- 15 15 15    METs -- -- 3.5  5.1 5     Home Exercise Plan   Plans to continue exercise at -- Home (comment)  walking, treadmill, elliptical Home (comment)  walking, treadmill, elliptical Home (comment)  walking, treadmill, elliptical Home (comment)  walking, treadmill, elliptical   Frequency -- Add 2 additional days to program exercise sessions. Add 2 additional days to program exercise sessions. Add 2 additional days to program exercise sessions. Add 2 additional days to program exercise sessions.   Initial Home Exercises Provided -- 03/20/22 03/20/22 03/20/22 03/20/22      Oxygen   Maintain Oxygen Saturation -- -- 88% or higher 88% or higher 88% or higher    Row Name 05/07/22 1300 05/21/22 1100 06/04/22 1300 06/18/22 1000       Response to Exercise   Blood Pressure (Admit) 112/60 124/60 122/64 122/80    Blood Pressure (Exercise) 178/68 -- -- --    Blood Pressure (Exit) 104/62 132/64 114/66 108/60    Heart Rate (Admit) 63 bpm 75 bpm 72 bpm 64 bpm    Heart Rate (Exercise) 140 bpm 117 bpm 118 bpm 128 bpm    Heart Rate (Exit) 78 bpm 88 bpm 78 bpm 71 bpm    Rating of Perceived Exertion (Exercise) 16 16 15 15     Symptoms none none none none    Duration Continue with 30 min of aerobic exercise without signs/symptoms of physical distress. Continue with 30 min of aerobic exercise without signs/symptoms of physical distress. Continue with 30 min of aerobic exercise without signs/symptoms of physical distress. Continue with 30 min of aerobic exercise without signs/symptoms of physical distress.    Intensity THRR unchanged THRR unchanged THRR unchanged THRR unchanged      Progression   Progression Continue to progress workloads to maintain intensity without signs/symptoms of physical distress. Continue to progress workloads to maintain intensity without signs/symptoms of physical distress. Continue to progress workloads to maintain intensity without signs/symptoms of physical distress. Continue to progress workloads to maintain intensity without signs/symptoms of physical distress.    Average METs 4.04 5.1 5.12 5.22      Resistance Training   Training Prescription Yes Yes Yes Yes    Weight 12 lb 12 lb 12 lb 12 lb    Reps 10-15 10-15 10-15 10-15      Interval Training   Interval Training No No No No      Treadmill   MPH 3.5 3.5 3.5 3.5    Grade 4 4 4 4     Minutes 15 15 15 15     METs 5.61 5.61 5.61 5.61      NuStep   Level 5 -- -- --    Minutes 15 -- -- --    METs 3.3 -- -- --      Elliptical   Level 3 3 3 3     Speed 4.2 4.2 4.2 4.4    Minutes 15 15 15  15     METs 3.7 3.7 3.7 3.4      REL-XR   Level 5 8 7  --    Minutes 15 15 15  --    METs 6.4 7.6 6 --      Home Exercise Plan   Plans to continue exercise at Home (comment)  walking, treadmill, elliptical Home (comment)  walking, treadmill, elliptical Home (comment)  walking, treadmill, elliptical Home (comment)  walking, treadmill, elliptical    Frequency Add 2 additional days to program exercise sessions. Add 2 additional days to program exercise sessions. Add 2 additional days to  program exercise sessions. Add 2 additional days to program exercise sessions.    Initial Home Exercises Provided 03/20/22 03/20/22 03/20/22 03/20/22      Oxygen   Maintain Oxygen Saturation 88% or higher 88% or higher 88% or higher 88% or higher             Exercise Comments:   Exercise Comments     Row Name 03/15/22 1001 06/19/22 1042         Exercise Comments First full day of exercise!  Patient was oriented to gym and equipment including functions, settings, policies, and procedures.  Patient's individual exercise prescription and treatment plan were reviewed.  All starting workloads were established based on the results of the 6 minute walk test done at initial orientation visit.  The plan for exercise progression was also introduced and progression will be customized based on patient's performance and goals. Govind graduated today from  rehab with 36 sessions completed.  Details of the patient's exercise prescription and what He needs to do in order to continue the prescription and progress were discussed with patient.  Patient was given a copy of prescription and goals.  Patient verbalized understanding.  Ryett plans to continue to exercise by exercising at home.               Exercise Goals and Review:   Exercise Goals     Row Name 03/12/22 1501             Exercise Goals   Increase Physical Activity Yes       Intervention Provide advice, education, support and counseling about  physical activity/exercise needs.;Develop an individualized exercise prescription for aerobic and resistive training based on initial evaluation findings, risk stratification, comorbidities and participant's personal goals.       Expected Outcomes Long Term: Add in home exercise to make exercise part of routine and to increase amount of physical activity.;Long Term: Exercising regularly at least 3-5 days a week.;Short Term: Attend rehab on a regular basis to increase amount of physical activity.       Increase Strength and Stamina Yes       Intervention Develop an individualized exercise prescription for aerobic and resistive training based on initial evaluation findings, risk stratification, comorbidities and participant's personal goals.;Provide advice, education, support and counseling about physical activity/exercise needs.       Expected Outcomes Short Term: Increase workloads from initial exercise prescription for resistance, speed, and METs.;Long Term: Improve cardiorespiratory fitness, muscular endurance and strength as measured by increased METs and functional capacity (6MWT);Short Term: Perform resistance training exercises routinely during rehab and add in resistance training at home       Able to understand and use rate of perceived exertion (RPE) scale Yes       Intervention Provide education and explanation on how to use RPE scale       Expected Outcomes Long Term:  Able to use RPE to guide intensity level when exercising independently;Short Term: Able to use RPE daily in rehab to express subjective intensity level       Able to understand and use Dyspnea scale Yes       Intervention Provide education and explanation on how to use Dyspnea scale       Expected Outcomes Short Term: Able to use Dyspnea scale daily in rehab to express subjective sense of shortness of breath during exertion;Long Term: Able to use Dyspnea scale to guide intensity level when exercising independently  Knowledge  and understanding of Target Heart Rate Range (THRR) Yes       Intervention Provide education and explanation of THRR including how the numbers were predicted and where they are located for reference       Expected Outcomes Short Term: Able to state/look up THRR;Short Term: Able to use daily as guideline for intensity in rehab;Long Term: Able to use THRR to govern intensity when exercising independently       Able to check pulse independently Yes       Intervention Review the importance of being able to check your own pulse for safety during independent exercise;Provide education and demonstration on how to check pulse in carotid and radial arteries.       Expected Outcomes Short Term: Able to explain why pulse checking is important during independent exercise;Long Term: Able to check pulse independently and accurately       Understanding of Exercise Prescription Yes       Intervention Provide education, explanation, and written materials on patient's individual exercise prescription       Expected Outcomes Short Term: Able to explain program exercise prescription;Long Term: Able to explain home exercise prescription to exercise independently                Exercise Goals Re-Evaluation :  Exercise Goals Re-Evaluation     Row Name 03/15/22 1001 03/20/22 1001 03/27/22 1508 04/09/22 1409 04/10/22 1011     Exercise Goal Re-Evaluation   Exercise Goals Review Able to understand and use rate of perceived exertion (RPE) scale;Able to understand and use Dyspnea scale;Knowledge and understanding of Target Heart Rate Range (THRR);Understanding of Exercise Prescription Able to understand and use rate of perceived exertion (RPE) scale;Able to understand and use Dyspnea scale;Knowledge and understanding of Target Heart Rate Range (THRR);Understanding of Exercise Prescription;Increase Physical Activity;Increase Strength and Stamina;Able to check pulse independently Increase Physical Activity;Understanding of  Exercise Prescription;Increase Strength and Stamina Increase Physical Activity;Understanding of Exercise Prescription;Increase Strength and Stamina Increase Physical Activity;Understanding of Exercise Prescription;Increase Strength and Stamina   Comments Reviewed RPE scale, THR and program prescription with pt today.  Pt voiced understanding and was given a copy of goals to take home. Reviewed home exercise with pt today.  Pt plans to walk and use personal treadmill and elliptical at home for exercise.  Reviewed THR, pulse, RPE, sign and symptoms, pulse oximetery and when to call 911 or MD.  Also discussed weather considerations and indoor options.  Pt voiced understanding. Tommy Rowe is off to a good start in rehab.  He has completed 3 full day sessions thus far.  He is already up to 3.5 METs on the XR.  We will conitnue to monitor his progress. Tommy Rowe is doing well in rehab. He recently improved his workload on the treadmill to a speed of 3 mph and an incline of 2%. He also improved from 7 lb to 10 lb hand weights for resistance training. We will continue to monitor his progress. Tommy Rowe continues to use his elliptical and treadmill at home,  where he exercises 10 min on elliptical, 20 min on on treadmill. He has been working on the treadmill at rehab more often to condition himself better on it. He exercises 2/3 days outside of rehab.  He is chekcing his HR and states that is gets up ot a max of 100- 124 bpm. He is going to buy a fitness watch. He is also interested in possibly going to the [employer] to change up some of  his routine and have more access to weight machines.   Expected Outcomes Short: Use RPE daily to regulate intensity. Long: Follow program prescription in THR. Short: Start to add in exercise at home Long; Conitnue to improve stamina Short: Continue to attend rehab regularly Long: continue to follow program presrcription Short: Try to use elliptical. Long: continue to improve strength and stamina. Short:  Continue to exercise at home, monitor HR Long: continue to exercise independently at home    [place] Name 04/23/22 1051 05/07/22 1357 05/08/22 1019 05/21/22 1130 06/04/22 1348     Exercise Goal Re-Evaluation   Exercise Goals Review Increase Physical Activity;Understanding of Exercise Prescription;Increase Strength and Stamina Increase Physical Activity;Understanding of Exercise Prescription;Increase Strength and Stamina Increase Physical Activity;Understanding of Exercise Prescription;Increase Strength and Stamina Increase Physical Activity;Understanding of Exercise Prescription;Increase Strength and Stamina Increase Physical Activity;Understanding of Exercise Prescription;Increase Strength and Stamina   Comments Tommy Rowe continues to do well in rehab. He did increase to level 7 on the XR working over 5 METS! He also increaed his workload on the treadmill to a 3.2 speed with a 3% incline. He continues to work on the elliptical 10 minutes each time and hopes to increase his duration progressively. He is also now using 12 lbs for handweights. We will continue to monitor. Tommy Rowe continues to do well in rehab. He has consistently worked at an average MET level above 4 METs. He also improved his workload on te elliptical to level 3 with a speed of 4.2 mph. He also increased his workload on the treadmill to a speed of 3.5 mph and an incline of 4%. We will continue to monitor his progress in the program. Tommy Rowe is doing well in rehab.  He is doing his home exercise.  He usually does 20 min on elliptical and 10 min on treadmill at home twice a week.  He is looking into finding gym close to home after graduation.  He is looking at maybe joining [employer] since they offer hours around the clock.  He would like [employer] but their hours may not work.  He does not have Silver Sneakers. Tommy Rowe continues to do well in rehab,. He increased his level on the XR to level 8, working over 7.6 METS! He stayed conistent at level 3 on the  elliptical and tolerating it well. He has been hitting his THR most sessions he is here. Will continue to monitor. Tommy Rowe is doing well in rehab. He is due for his post 6MWT and will look to improve on it. He has continued to work at an average MET level above 5 METs. He also has stayed consistent with the elliptical at level 3 and the treadmill at a speed of 3.5 mph and an incline of 4%. We will continue to monitor his progress in the program.   Expected Outcomes Short: Increase duration on elliptical Long: Continue to increase overall stamina and MET level Short: Conitnue to increase workload on treadmill. Long: Continue to improve strength and stamina. Short: Continue to work to find a gym for after graduation Long: continue to improve stamina Short: Continue to increase workload on elliptical Long: Increase overall MET level and stamina Short: Improve on post 6MWT. Long: Continue to increase strength and stamina.    [place] Name 06/05/22 1018 06/18/22 1003           Exercise Goal Re-Evaluation   Exercise Goals Review Increase Physical Activity;Understanding of Exercise Prescription;Increase Strength and Stamina Increase Physical Activity;Understanding of Exercise Prescription;Increase Strength and  Stamina      Comments Tommy Rowe is due for his post walk this week and we expect to see an improvement in his score.  He is planning to join the [employer] after graduation to keep exercising routinely.  He does feel like his stamina has gotten better since being in rehab. Tommy Rowe is doing well in rehab and getting ready to graduate next class. He improved his post 6MWT by 27%, walking over 2,000 ft! He increased his speed on the elliptical. We will continue to monitor until he graduates.      Expected Outcomes [place]: Improve post 6MWT Long: Conitnue to exercise independently Short: Graduate Long: Continue to exercise at home at appropriate prescription               Discharge Exercise Prescription (Final Exercise  Prescription Changes):  Exercise Prescription Changes - 06/18/22 1000       Response to Exercise   Blood Pressure (Admit) 122/80    Blood Pressure (Exit) 108/60    Heart Rate (Admit) 64 bpm    Heart Rate (Exercise) 128 bpm    Heart Rate (Exit) 71 bpm    Rating of Perceived Exertion (Exercise) 15    Symptoms none    Duration Continue with 30 min of aerobic exercise without signs/symptoms of physical distress.    Intensity THRR unchanged      Progression   Progression Continue to progress workloads to maintain intensity without signs/symptoms of physical distress.    Average METs 5.22      Resistance Training   Training Prescription Yes    Weight 12 lb    Reps 10-15      Interval Training   Interval Training No      Treadmill   MPH 3.5    Grade 4    Minutes 15    METs 5.61      Elliptical   Level 3    Speed 4.4    Minutes 15    METs 3.4      Home Exercise Plan   Plans to continue exercise at Home (comment)   walking, treadmill, elliptical   Frequency Add 2 additional days to program exercise sessions.    Initial Home Exercises Provided 03/20/22      Oxygen   Maintain Oxygen Saturation 88% or higher             Nutrition:  Target Goals: Understanding of nutrition guidelines, daily intake of sodium 1500mg , cholesterol 200mg , calories 30% from fat and 7% or less from saturated fats, daily to have 5 or more servings of fruits and vegetables.  Education: All About Nutrition: -Group instruction provided by verbal, written material, interactive activities, discussions, models, and posters to present general guidelines for heart healthy nutrition including fat, fiber, MyPlate, the role of sodium in heart healthy nutrition, utilization of the nutrition label, and utilization of this knowledge for meal planning. Follow up email sent as well. Written material given at graduation. Flowsheet Row Cardiac Rehab from 03/22/2022 in [facility] Cardiac and Pulmonary Rehab  Education  need identified 03/12/22       Biometrics:  Pre Biometrics - 03/12/22 1502       Pre Biometrics   Height 6' 0.5" (1.842 m)    Weight 207 lb 12.8 oz (94.3 kg)    Waist Circumference 38.5 inches    Hip Circumference 39 inches    Waist to Hip Ratio 0.99 %    BMI (Calculated) 27.78    Single Leg Stand  2.5 seconds             Post Biometrics - 06/07/22 1034        Post  Biometrics   Height 6' 0.5" (1.842 m)    Weight 207 lb 1.6 oz (93.9 kg)    Waist Circumference 38.5 inches    Hip Circumference 39.5 inches    Waist to Hip Ratio 0.97 %    BMI (Calculated) 27.69    Single Leg Stand 30 seconds             Nutrition Therapy Plan and Nutrition Goals:  Nutrition Therapy & Goals - 03/12/22 1129       Nutrition Therapy   Diet Heart healthy, low Na    Drug/Food Interactions Statins/Certain Fruits    Protein (specify units) 75-85g    Fiber 30 grams    Whole Grain Foods 3 servings    Saturated Fats 16 max. grams    Fruits and Vegetables 8 servings/day    Sodium 2 grams      Personal Nutrition Goals   Nutrition Goal ST:  review paperwork, include snacks during the day to see how that affects hunger in evening LT: practice MyPlate guidelines, include 5-8 servings or fruit/vegetables per day    Comments 67 y.o. M admitted to cardiac rehab s/p NSTEMI. PMHx includes HTN, HLD, former tobacco use, CAD, a. fib. Relevant medications includes lexapro, MVI with minerals, crestor. B: oatmeal with bananas, walnuts, blueberries - if he gets breakfast out, he gets oatmeal from [employer] L: chicken salad or deli [place] on whole wheat bread D: chicken, pork (~1x/week), chooses lean ground meat (~1x/week), fish including salmon. He enjoys potatoes sometimes with his meals as well as non-starchy vegetbles such as broccoli. S: peanut butter - sometimes with fruit. Tommy Rowe reports feeling hungry towards the end of the day - discussed including some snacks such as yogurt with nuts and fruit or peanut  butter with fruit to eat during the day in-between meals. Tommy Rowe reports not salting any of his food and limiting eating out to no more than 2-3x/week. He will cook with olive oil, but does use some butter with a potato or on toast sparingly. Discussed MyPlate and heart healthy eating.      Intervention Plan   Intervention Prescribe, educate and counsel regarding individualized specific dietary modifications aiming towards targeted core components such as weight, hypertension, lipid management, diabetes, heart failure and other comorbidities.;Nutrition handout(s) given to patient.    Expected Outcomes Short Term Goal: Understand basic principles of dietary content, such as calories, fat, sodium, cholesterol and nutrients.;Short Term Goal: A plan has been developed with personal nutrition goals set during dietitian appointment.;Long Term Goal: Adherence to prescribed nutrition plan.             Nutrition Assessments:  MEDIFICTS Score Key: ?70 Need to make dietary changes  40-70 Heart Healthy Diet ? 40 Therapeutic Level Cholesterol Diet  Flowsheet Row Cardiac Rehab from 03/12/2022 in [facility] Cardiac and Pulmonary Rehab  Picture Your Plate Total Score on Admission 80      Picture Your Plate Scores: [id number] Unhealthy dietary pattern with much room for improvement. 41-50 Dietary pattern unlikely to meet recommendations for good health and room for improvement. 51-60 More healthful dietary pattern, with some room for improvement.  >60 Healthy dietary pattern, although there may be some specific behaviors that could be improved.    Nutrition Goals Re-Evaluation:  Nutrition Goals Re-Evaluation     [place] Name 04/10/22 1023 05/08/22  1027 06/05/22 1022         Goals   Nutrition Goal ST:  review paperwork, include snacks during the day to see how that affects hunger in evening LT: practice MyPlate guidelines, include 5-8 servings or fruit/vegetables per day Short: Focus on increasing more talk,  continue to add in fruits and veggies Long: Continue to eat heart healthy diet Short: Continue to make healhty choices for snacks Long: conitnue to eat heart healthy.     Comment Tommy Rowe is wanting to lose weight, he has a weight goal of 195 lb His biggest weakness is that he snacks at night and his go to is peanut butter which he knows he exceeds the serving sizes. He typically eats double the serving size and is aware it is calorically dense. Lately, he has been trying to pair it with an apple to increase his fruit and fiber. We talked about alternatively using PB2 and looking up recipes for banana "nice-cream."  He has been snacking more specifically on mixed nuts such as almonds, cashews, etc. He has been drinking Atkin shakes as well. He also claims he is eating more salads and increased his intake on fruit, specifically on blueberries and bananas. Most mornings, he is eating with oatmeal  for breakfast paired with fruits and nuts. He is looking to increase his water intake as he know he slacks on that. Tommy Rowe is doing well in rehab.  He continues to work on his diet.  He is still using the PB2 to help.  He still likes to eat.  He is eating lots of chicken and salads with broccoli.  He will also dd in [place] each week too.  He is also doing well with his fruit.  His favorites are cantelope and bluberries.  He is also snacking on nuts to help.  He tries to get the unsalted as often as possible Tommy Rowe continues to work on his diet and still using his PB2.  He turned down a bundt cake from his wife so that he wouldn't have the extra sugar.  We talked about allowing slips every once in a while.  He is happy with eating his favorites.  His sweets are peanut butter and a keto shake.     Expected Outcome Short: Focus on increasing more talk, continue to add in fruits and veggies Long: Continue to eat heart healthy diet Short: Continue to make healhty choices for snacks Long: conitnue to eat heart healthy. Continue to focus  on heart healthy diet              Nutrition Goals Discharge (Final Nutrition Goals Re-Evaluation):  Nutrition Goals Re-Evaluation - 06/05/22 1022       Goals   Nutrition Goal Short: Continue to make healhty choices for snacks Long: conitnue to eat heart healthy.    Comment Tommy Rowe continues to work on his diet and still using his PB2.  He turned down a bundt cake from his wife so that he wouldn't have the extra sugar.  We talked about allowing slips every once in a while.  He is happy with eating his favorites.  His sweets are peanut butter and a keto shake.    Expected Outcome Continue to focus on heart healthy diet             Psychosocial: Target Goals: Acknowledge presence or absence of significant depression and/or stress, maximize coping skills, provide positive support system. Participant is able to verbalize types and ability to use techniques  and skills needed for reducing stress and depression.   Education: Stress, Anxiety, and Depression - Group verbal and visual presentation to define topics covered.  Reviews how body is impacted by stress, anxiety, and depression.  Also discusses healthy ways to reduce stress and to treat/manage anxiety and depression.  Written material given at graduation.   Education: Sleep Hygiene -Provides group verbal and written instruction about how sleep can affect your health.  Define sleep hygiene, discuss sleep cycles and impact of sleep habits. Review good sleep hygiene tips.    Initial Review & Psychosocial Screening:  Initial Psych Review & Screening - 02/14/22 0920       Initial Review   Current issues with Current Psychotropic Meds;Current Anxiety/Panic   lexapro is for anxiety, been taking for years. It continues to help.     Family Dynamics   Good Support System? Yes   wife, daughter grandkids,  step kids     Barriers   Psychosocial barriers to participate in program There are no identifiable barriers or psychosocial needs.       Screening Interventions   Interventions Encouraged to exercise;To provide support and resources with identified psychosocial needs;Provide feedback about the scores to participant    Expected Outcomes Short Term goal: Utilizing psychosocial counselor, staff and physician to assist with identification of specific Stressors or current issues interfering with healing process. Setting desired goal for each stressor or current issue identified.;Long Term Goal: Stressors or current issues are controlled or eliminated.;Short Term goal: Identification and review with participant of any Quality of Life or Depression concerns found by scoring the questionnaire.;Long Term goal: The participant improves quality of Life and PHQ9 Scores as seen by post scores and/or verbalization of changes             Quality of Life Scores:   Quality of Life - 03/12/22 1502       Quality of Life   Select Quality of Life      Quality of Life Scores   Health/Function Pre 28 %    Socioeconomic Pre 23.94 %    Psych/Spiritual Pre 30 %    Family Pre 28.8 %    GLOBAL Pre 27.59 %            Scores of 19 and below usually indicate a poorer quality of life in these areas.  A difference of  2-3 points is a clinically meaningful difference.  A difference of 2-3 points in the total score of the Quality of Life Index has been associated with significant improvement in overall quality of life, self-image, physical symptoms, and general health in studies assessing change in quality of life.  PHQ-9: Review Flowsheet       03/12/2022  Depression screen PHQ 2/9  Decreased Interest 0  Down, Depressed, Hopeless 0  PHQ - 2 Score 0  Altered sleeping 1  Tired, decreased energy 0  Change in appetite 1  Feeling bad or failure about yourself  0  Trouble concentrating 0  Moving slowly or fidgety/restless 0  Suicidal thoughts 0  PHQ-9 Score 2  Difficult doing work/chores Not difficult at all   Interpretation of Total  Score  Total Score Depression Severity:  1-4 = Minimal depression, 5-9 = Mild depression, 10-14 = Moderate depression, 15-19 = Moderately severe depression, 20-27 = Severe depression   Psychosocial Evaluation and Intervention:  Psychosocial Evaluation - 02/14/22 0943       Psychosocial Evaluation & Interventions   Interventions Encouraged to exercise with  the program and follow exercise prescription    Comments Tommy Rowe has no barriers to attending the prgoram. He is ready to learn how to manage his heart disease and live as long as he can. He has a history of anxiety and is on Lexapro. He stated that this helps. He has changed jobs and his anxiety is not as bad now. His health care team has kept him on Lexapro"if it is working,don't change it".  Tommy Rowe lives with his wife. She and his daughter, grandchildren and step children are his support.  He is the support for his daughter and grandchildren. He is ready to get started with the program.    Expected Outcomes STG Tommy Rowe attends all scheduled sessions, he continues to keep anxiety levels low. LTG Tommy Rowe continues his exercise progression and continues to manage any anxiety symptoms after discharge from the program.    Continue Psychosocial Services  Follow up required by staff             Psychosocial Re-Evaluation:  Psychosocial Re-Evaluation     [place] Name 04/06/22 1006 04/10/22 1033 05/08/22 1023 06/05/22 1020       Psychosocial Re-Evaluation   Current issues with None Identified None Identified None Identified;Current Stress Concerns Current Anxiety/Panic;Current Psychotropic Meds;Current Stress Concerns    Comments Reviewed patient health questionnaire (PHQ-9) with patient for follow up. Previously, patients score indicated signs/symptoms of depression.  Reviewed to see if patient is improving symptom wise while in program.  Score improved and patient states that it is because his work and family life are good. He states exercise has helped him  alot as well. Tommy Rowe states he is doing well mentally. States "home life" is good and has no complaints as he has supportive family at home well. He feels he manages his stress well (from what he has) as he keeps busy. 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 He does takes Lexapro that he feels like helps him. His sleep is good. He denies other concerns at this time. Tommy Rowe is doing well in rehab.  He is already planning to find a gym for after graduation.  He wants to do [facility] but worried about hours with his business hours too.  He is staying busy at work, but managing the best he can.  He has busy weeks and calmer weeks.  He has already planned to just stick to smaller jobs now.  He is not sleeping too great recently, but this has been going on a welll.  If he takes Tylenol  PM it helps, but then he feels low in the morning.  He struggles with getting to sleep versus staying asleep.  He also gets up to go to bathroom at night.  We talked about trying different doses of melatonin to help as well. Tommy Rowe is getting close to graduation.  He has enjoyed rehab and learned a lot in class.  He has enjoyed getting to know staff and classmates,  He is pleased with the care he has recieved in the hospital during his heart event.  He contineus to have some sleep issues with getting his 5 hrs routinely.  Once he wakes, he has a hard time getting back to sleep. He does want to talk to doctor about other options than Tylenol PM.  He is feeling pretty good mentally otherwise.    Expected Outcomes Short: Continue to attend [facility] regularly for regular exercise and social engagement. Long: Continue to improve  symptoms and manage a positive mental state. Short: Continue to manage his stress appropriately, notify MD or staff of any changes Long: Continue to maintain positive attitude Short: Try out melatonin again for sleep Long: Continue to not let stress get to him at work Short: talk to  MD about sleep Long: conitnue to exercise for mental boost    Interventions Encouraged to attend Cardiac Rehabilitation for the exercise Encouraged to attend Cardiac Rehabilitation for the exercise Encouraged to attend Cardiac Rehabilitation for the exercise --    Continue Psychosocial Services  No Follow up required Follow up required by staff Follow up required by staff --             Psychosocial Discharge (Final Psychosocial Re-Evaluation):  Psychosocial Re-Evaluation - 06/05/22 1020       Psychosocial Re-Evaluation   Current issues with Current Anxiety/Panic;Current Psychotropic Meds;Current Stress Concerns    Comments Tommy Rowe is getting close to graduation.  He has enjoyed rehab and learned a lot in class.  He has enjoyed getting to know staff and classmates,  He is pleased with the care he has recieved in the hospital during his heart event.  He contineus to have some sleep issues with getting his 5 hrs routinely.  Once he wakes, he has a hard time getting back to sleep. He does want to talk to doctor about other options than Tylenol PM.  He is feeling pretty good mentally otherwise.    Expected Outcomes Short: talk to MD about sleep Long: conitnue to exercise for mental boost             Vocational Rehabilitation: Provide vocational rehab assistance to qualifying candidates.   Vocational Rehab Evaluation & Intervention:   Education: Education Goals: Education classes will be provided on a variety of topics geared toward better understanding of heart health and risk factor modification. Participant will state understanding/return demonstration of topics presented as noted by education test scores.  Learning Barriers/Preferences:   General Cardiac Education Topics:  AED/CPR: - Group verbal and written instruction with the use of models to demonstrate the basic use of the AED with the basic ABC's of resuscitation.   Anatomy and Cardiac Procedures: - Group verbal and  visual presentation and models provide information about basic cardiac anatomy and function. Reviews the testing methods done to diagnose heart disease and the outcomes of the test results. Describes the treatment choices: Medical Management, Angioplasty, or Coronary Bypass Surgery for treating various heart conditions including Myocardial Infarction, Angina, Valve Disease, and Cardiac Arrhythmias.  Written material given at graduation.   Medication Safety: - Group verbal and visual instruction to review commonly prescribed medications for heart and lung disease. Reviews the medication, class of the drug, and side effects. Includes the steps to properly store meds and maintain the prescription regimen.  Written material given at graduation. Flowsheet Row Cardiac Rehab from 03/22/2022 in [facility] Cardiac and Pulmonary Rehab  Date 03/15/22  Educator SB  Instruction Review Code 1- Verbalizes Understanding       Intimacy: - Group verbal instruction through game format to discuss how heart and lung disease can affect sexual intimacy. Written material given at graduation..   Know Your Numbers and Heart Failure: - Group verbal and visual instruction to discuss disease risk factors for cardiac and pulmonary disease and treatment options.  Reviews associated critical values for Overweight/Obesity, Hypertension, Cholesterol, and Diabetes.  Discusses basics of heart failure: signs/symptoms and treatments.  Introduces Heart Failure Zone chart for action plan for  heart failure.  Written material given at graduation. Flowsheet Row Cardiac Rehab from 03/22/2022 in [facility] Cardiac and Pulmonary Rehab  Education need identified 03/12/22  Date 03/22/22  Educator SB  Instruction Review Code 1- Verbalizes Understanding       Infection Prevention: - Provides verbal and written material to individual with discussion of infection control including proper hand washing and proper equipment cleaning during exercise  session. Flowsheet Row Cardiac Rehab from 03/22/2022 in [facility] Cardiac and Pulmonary Rehab  Date 03/12/22  Educator [facility]  Instruction Review Code 1- Verbalizes Understanding       Falls Prevention: - Provides verbal and written material to individual with discussion of falls prevention and safety. Flowsheet Row Cardiac Rehab from 03/22/2022 in [facility] Cardiac and Pulmonary Rehab  Date 02/14/22  Educator SB  Instruction Review Code 1- Verbalizes Understanding       Other: -Provides group and verbal instruction on various topics (see comments)   Knowledge Questionnaire Score:  Knowledge Questionnaire Score - 03/12/22 1503       Knowledge Questionnaire Score   Pre Score 22/26             Core Components/Risk Factors/Patient Goals at Admission:  Personal Goals and Risk Factors at Admission - 03/12/22 1503       Core Components/Risk Factors/Patient Goals on Admission    Weight Management Yes;Weight Loss    Intervention Weight Management: Develop a combined nutrition and exercise program designed to reach desired caloric intake, while maintaining appropriate intake of nutrient and fiber, sodium and fats, and appropriate energy expenditure required for the weight goal.;Weight Management/Obesity: Establish reasonable short term and long term weight goals.;Weight Management: Provide education and appropriate resources to help participant work on and attain dietary goals.    Admit Weight 207 lb 12.8 oz (94.3 kg)    Goal Weight: Short Term 203 lb (92.1 kg)    Goal Weight: Long Term 195 lb (88.5 kg)    Expected Outcomes Short Term: Continue to assess and modify interventions until short term weight is achieved;Long Term: Adherence to nutrition and physical activity/exercise program aimed toward attainment of established weight goal;Weight Loss: Understanding of general recommendations for a balanced deficit meal plan, which promotes 1-2 lb weight loss per week and includes a negative  energy balance of [phone] kcal/d;Understanding recommendations for meals to include 15-35% energy as protein, 25-35% energy from fat, 35-60% energy from carbohydrates, less than 200mg  of dietary cholesterol, 20-35 gm of total fiber daily;Understanding of distribution of calorie intake throughout the day with the consumption of 4-5 meals/snacks    Hypertension Yes    Intervention Provide education on lifestyle modifcations including regular physical activity/exercise, weight management, moderate sodium restriction and increased consumption of fresh fruit, vegetables, and low fat dairy, alcohol moderation, and smoking cessation.;Monitor prescription use compliance.    Expected Outcomes Short Term: Continued assessment and intervention until BP is < 140/88mm HG in hypertensive participants. < 130/39mm HG in hypertensive participants with diabetes, heart failure or chronic kidney disease.;Long Term: Maintenance of blood pressure at goal levels.    Lipids Yes    Intervention Provide education and support for participant on nutrition & aerobic/resistive exercise along with prescribed medications to achieve LDL 70mg , HDL >40mg .    Expected Outcomes Short Term: Participant states understanding of desired cholesterol values and is compliant with medications prescribed. Participant is following exercise prescription and nutrition guidelines.;Long Term: Cholesterol controlled with medications as prescribed, with individualized exercise RX and with personalized nutrition plan. Value goals: LDL <  70mg , HDL > 40 mg.             Education:Diabetes - Individual verbal and written instruction to review signs/symptoms of diabetes, desired ranges of glucose level fasting, after meals and with exercise. Acknowledge that pre and post exercise glucose checks will be done for 3 sessions at entry of program.   Core Components/Risk Factors/Patient Goals Review:   Goals and Risk Factor Review     [place] Name 04/10/22 1028  05/08/22 1029 06/05/22 1024         Core Components/Risk Factors/Patient Goals Review   Personal Goals Review Weight Management/Obesity;Hypertension;Lipids Weight Management/Obesity;Hypertension;Lipids Weight Management/Obesity;Hypertension;Lipids     Review goal weight is [phone]. currentl 208 lb. Checking BP at home, ranging 110/7os. Checking HR 60s rest, 100-120s for exercise. BP at rehab good too.Taking all medications as precsrbied. lightheaded, cut ami in hald but still sympspatic. Tommy Rowe is doing well in rehab.  His weight is holding steady.  He wants to continue to lose, but he does like to eat.  His pressures are doing well and he continues to check them at home.  They did decrease his blood pressure meds and they has cut back on his dizziness.  They are also thinking about dropping the amiodarone overall as long as rhythm is doing well.  His meds are doing well overall. Tommy Rowe has done well in rehab.  His weight has held steady.  He wants to lose more and frustrated with plateau but we talked about sticking with it to break through.  His pressures are still doing well.  He is feeling good overall.     Expected Outcomes -- Short: Conitnue to work on weight loss long: conitnue to monitor risk factors. --              Core Components/Risk Factors/Patient Goals at Discharge (Final Review):   Goals and Risk Factor Review - 06/05/22 1024       Core Components/Risk Factors/Patient Goals Review   Personal Goals Review Weight Management/Obesity;Hypertension;Lipids    Review Tommy Rowe has done well in rehab.  His weight has held steady.  He wants to lose more and frustrated with plateau but we talked about sticking with it to break through.  His pressures are still doing well.  He is feeling good overall.             ITP Comments:  ITP Comments     Row Name 02/14/22 0941 03/12/22 1451 03/15/22 1001 03/28/22 0943 04/25/22 0856   ITP Comments Virtual orientation call completed today. he has an  appointment on Date: 02/26/2022  for EP eval and gym Orientation.  Documentation of diagnosis can be found in [facility] Date: 09/17/2021 .       Tommy Rowe took time researching Cardiac Rehab before committing to the program. Completed 6MWT and gym orientation. Initial ITP created and sent for review to Dr. Emily Filbert, Medical Director. First full day of exercise!  Patient was oriented to gym and equipment including functions, settings, policies, and procedures.  Patient's individual exercise prescription and treatment plan were reviewed.  All starting workloads were established based on the results of the 6 minute walk test done at initial orientation visit.  The plan for exercise progression was also introduced and progression will be customized based on patient's performance and goals. 30 Day review completed. Medical Director ITP review done, changes made as directed, and signed approval by Medical Director.    new to program 30 Day review completed. Medical Director ITP  review done, changes made as directed, and signed approval by Medical Director.    [place] Name 05/23/22 0751           ITP Comments 30 day review completed. ITP sent to Dr. Emily Filbert, Medical Director of Cardiac Rehab. Continue with ITP unless changes are made by physician.                Comments: Discharge ITP

## 2022-06-19 NOTE — Progress Notes (Signed)
Daily Session Note  Patient Details  Name: Tommy Rowe MRN: [id number] Date of Birth: [birth year] Referring Provider:   Flowsheet Row Cardiac Rehab from 03/12/2022 in [facility]  Referring Provider Lujean Amel MD       Encounter Date: 06/19/2022  Check In:  Session Check In - 06/19/22 1040       Check-In   Supervising physician immediately available to respond to emergencies See telemetry face sheet for immediately available ER MD    Location [facility]    Staff Present Heath Lark, RN, BSN, CCRP;Jessica [place], MA, RCEP, CCRP, Bertram Gala, MS, ACSM CEP, Exercise Physiologist    Virtual Visit No    Medication changes reported     No    Fall or balance concerns reported    No    Warm-up and Cool-down Performed on first and last piece of equipment    Resistance Training Performed Yes    VAD Patient? No    PAD/SET Patient? No      Pain Assessment   Currently in Pain? No/denies                Social History   Tobacco Use  Smoking Status Former   Types: Cigarettes   Quit date: 04/02/1988   Years since quitting: 34.2   Passive exposure: Past  Smokeless Tobacco Never    Goals Met:  Independence with exercise equipment Exercise tolerated well Personal goals reviewed No report of concerns or symptoms today  Goals Unmet:  Not Applicable  Comments:  Tommy Rowe graduated today from  rehab with 36 sessions completed.  Details of the patient's exercise prescription and what He needs to do in order to continue the prescription and progress were discussed with patient.  Patient was given a copy of prescription and goals.  Patient verbalized understanding.  Tommy Rowe plans to continue to exercise by exercising at home.    Dr. Emily Filbert is Medical Director for [facility].  Dr. Ottie Glazier is Medical Director for [facility].

## 2022-06-19 NOTE — Progress Notes (Signed)
  Discharge Note  Tommy Rowe  DOB:July 03, 1955     Tommy Rowe graduated today from  rehab with 36 sessions completed.  Details of the patient's exercise prescription and what He needs to do in order to continue the prescription and progress were discussed with patient.  Patient was given a copy of prescription and goals.  Patient verbalized understanding.  [name] plans to continue to exercise by exercising at home.   [place] Name 03/12/22 1451 06/07/22 1033       6 Minute Walk   Phase Initial Discharge    Distance 1600 feet 2040 feet    Distance % Change -- 27.5 %    Distance Feet Change -- 440 ft    Walk Time 6 minutes 6 minutes    # of Rest Breaks 0 0    MPH 3.03 3.86    METS 3.87 4.97    RPE 12 12    VO2 Peak 13.57 17.38    Symptoms No No    Resting HR 63 bpm 70 bpm    Resting BP 122/64 122/70    Resting Oxygen Saturation  96 % 95 %    Exercise Oxygen Saturation  during 6 min walk 95 % 97 %    Max Ex. HR 102 bpm 128 bpm    Max Ex. BP 144/75 148/76    2 Minute Post BP 122/66 --            Thank you for the referral

## 2022-06-21 ENCOUNTER — Encounter: Payer: Medicare Other | Admitting: *Deleted

## 2022-08-15 ENCOUNTER — Ambulatory Visit
Admission: RE | Admit: 2022-08-15 | Discharge: 2022-08-15 | Disposition: A | Discharge status: Home / Self Care | Payer: Medicare Other | Source: Ambulatory Visit | Attending: Urology | Admitting: Urology

## 2022-08-15 ENCOUNTER — Other Ambulatory Visit: Payer: Self-pay | Admitting: *Deleted

## 2022-08-15 ENCOUNTER — Ambulatory Visit: Payer: Medicare Other | Admitting: Urology

## 2022-08-15 DIAGNOSIS — N200 Calculus of kidney: Secondary | ICD-10-CM

## 2022-08-16 ENCOUNTER — Ambulatory Visit (INDEPENDENT_AMBULATORY_CARE_PROVIDER_SITE_OTHER): Payer: Medicare Other | Admitting: Urology

## 2022-08-16 ENCOUNTER — Encounter: Payer: Self-pay | Admitting: Urology

## 2022-08-16 VITALS — BP 139/81 | HR 57 | Ht 72.0 in | Wt 210.0 lb

## 2022-08-16 DIAGNOSIS — R310 Gross hematuria: Secondary | ICD-10-CM | POA: Diagnosis not present

## 2022-08-16 DIAGNOSIS — N210 Calculus in bladder: Secondary | ICD-10-CM | POA: Diagnosis not present

## 2022-08-16 DIAGNOSIS — N529 Male erectile dysfunction, unspecified: Secondary | ICD-10-CM

## 2022-08-16 DIAGNOSIS — N200 Calculus of kidney: Secondary | ICD-10-CM

## 2022-08-16 LAB — BLADDER SCAN AMB NON-IMAGING

## 2022-08-16 MED ORDER — TADALAFIL 20 MG PO TABS: 10.0000 mg | ORAL_TABLET | Freq: Every day | ORAL | 6 refills | Status: DC | PRN

## 2022-08-16 NOTE — Progress Notes (Signed)
   08/16/2022 10:51 AM   Dagmar Hait Aug 20, 1955 [id number]  Reason for visit: Follow up gross hematuria, bladder stone, ED  HPI: 67 year old male with history of MI and cardiac stent placement with Dr. Kayren Eaves in June 2023, remains on anticoagulation with Eliquis.  He was seen in the ER in November 2023 with gross hematuria and urinary retention, 14 French Foley was placed and not surprisingly clotted off, and he returned later in the day and a 20 [place] catheter was placed.  He passed a voiding trial in our clinic in December 2023.  He underwent a CT urogram on 03/23/2022 for further evaluation of the gross hematuria, and this showed a 8 mm stone in the bladder, 70 g prostate, but no other abnormalities.  He no showed his cystoscopy visit, but ultimately had a virtual visit on 04/04/2022.  At that point I again recommended cystoscopy for completion of gross hematuria workup, and we also reviewed treatment options for his bladder stone.  He wanted to defer any further evaluation until after he stopped his dual anticoagulation 1 year after MI.  He denies any problems since our last visit, specifically no urinary symptoms, dysuria, gross hematuria, or UTIs.  PVR today normal at 65ml. I personally viewed and interpreted the KUB today that shows slight increase size of his bladder stone, now measuring 1 cm.  We again reviewed possible etiologies of gross hematuria, as well as likely component of incomplete bladder emptying with his large bladder stone.  He remains hesitant to undergo any further evaluation at this time.  We discussed the risk of UTI, recurrent gross hematuria/retention, stone getting lodged in the urethra, and I think he understands these risks.  He was amenable to follow-up in 4 to 5 months with repeat KUB at that time and PVR, and may be interested in considering cystolitholapaxy as well as HOLEP at that time.  We discussed that typically in patients with bladder stones we offer an  outlet procedure simultaneously to prevent recurrent episodes of bladder stones or worsening urinary symptoms.  We also discussed the possibility of additional pathology that could require additional intervention like bladder tumor.  Finally, he was also interested in other options for ED.  He has tried sildenafil previously with mixed results.  We reviewed the risks and benefits of Cialis, and he was interested in a trial of Cialis 10 to 20 mg on demand for ED.  He has nitroglycerin on his medication list, but reports he has never taken this.  We discussed the importance of not taking these medications together  Trial of Cialis 10 to 20 mg as needed for ED RTC 4 to 5 months PVR, KUB, rediscuss cystolitholapaxy/HOLEP(70g)   Sondra Come, MD  [facility] [address], [address] [place], [place] [address] [phone]    70g prostate

## 2022-08-16 NOTE — Patient Instructions (Signed)

## 2023-01-17 ENCOUNTER — Ambulatory Visit: Payer: Medicare Other | Admitting: Urology

## 2023-04-11 ENCOUNTER — Ambulatory Visit: Payer: Medicare Other | Admitting: Urology

## 2023-06-05 ENCOUNTER — Other Ambulatory Visit: Payer: Self-pay

## 2023-06-05 DIAGNOSIS — N200 Calculus of kidney: Secondary | ICD-10-CM

## 2023-06-05 DIAGNOSIS — N210 Calculus in bladder: Secondary | ICD-10-CM

## 2023-06-06 ENCOUNTER — Ambulatory Visit: Payer: Medicare Other | Admitting: Urology

## 2023-06-06 ENCOUNTER — Ambulatory Visit
Admission: RE | Admit: 2023-06-06 | Discharge: 2023-06-06 | Disposition: A | Discharge status: Home / Self Care | Source: Ambulatory Visit | Attending: Urology | Admitting: Urology

## 2023-06-06 VITALS — BP 141/76 | HR 76 | Ht 72.0 in | Wt 205.0 lb

## 2023-06-06 DIAGNOSIS — N401 Enlarged prostate with lower urinary tract symptoms: Secondary | ICD-10-CM

## 2023-06-06 DIAGNOSIS — N210 Calculus in bladder: Secondary | ICD-10-CM | POA: Insufficient documentation

## 2023-06-06 DIAGNOSIS — N200 Calculus of kidney: Secondary | ICD-10-CM

## 2023-06-06 DIAGNOSIS — N529 Male erectile dysfunction, unspecified: Secondary | ICD-10-CM

## 2023-06-06 DIAGNOSIS — R399 Unspecified symptoms and signs involving the genitourinary system: Secondary | ICD-10-CM | POA: Diagnosis not present

## 2023-06-06 DIAGNOSIS — N138 Other obstructive and reflux uropathy: Secondary | ICD-10-CM

## 2023-06-06 DIAGNOSIS — R310 Gross hematuria: Secondary | ICD-10-CM

## 2023-06-06 LAB — BLADDER SCAN AMB NON-IMAGING: Scan Result: 2

## 2023-06-06 MED ORDER — TADALAFIL 20 MG PO TABS: 10.0000 mg | ORAL_TABLET | Freq: Every day | ORAL | 6 refills | Status: DC | PRN

## 2023-06-06 MED ORDER — TAMSULOSIN HCL 0.4 MG PO CAPS: 0.4000 mg | ORAL_CAPSULE | Freq: Every day | ORAL | 11 refills | Status: DC

## 2023-06-06 MED ORDER — SILDENAFIL CITRATE 50 MG PO TABS: 50.0000 mg | ORAL_TABLET | Freq: Every day | ORAL | 6 refills | Status: DC | PRN

## 2023-06-06 NOTE — Progress Notes (Signed)
   06/06/2023 11:09 AM   Tommy Rowe 1955-10-05 [id number]  Reason for visit: Follow up gross hematuria, bladder stone, ED  HPI: 68 year old male with history of MI and cardiac stent placement with Dr. Juliann Pares in June 2023, remains on anticoagulation with Eliquis.  He was seen in the ER in November 2023 with gross hematuria and urinary retention, 14 French Foley was placed and not surprisingly clotted off, and he returned later in the day and a 20 [place] catheter was placed.  He passed a voiding trial in our clinic in December 2023.  He underwent a CT urogram on 03/23/2022 for further evaluation of the gross hematuria, and this showed a 8 mm stone in the bladder, 70 g prostate, but no other abnormalities.  He no showed his cystoscopy visit, but ultimately had a virtual visit on 04/04/2022.  At that point I again recommended cystoscopy for completion of gross hematuria workup, and we also reviewed treatment options for his bladder stone.  He deferred further evaluation or treatments.  He denies any problems since our last visit.  He has some urinary dribbling and weak stream, denies any recurrent gross hematuria or UTIs.  PVR today is normal at 2ml.  I personally viewed and interpreted his KUB today that shows stable 1 cm bladder stone.  We again reviewed possible etiologies of gross hematuria, as well as likely component of incomplete bladder emptying with his large bladder stone.  He remains hesitant to undergo any further evaluation at this time.  We discussed the risk of UTI, recurrent gross hematuria/retention, stone getting lodged in the urethra, and I think he understands these risks.  He was amenable to a trial of Flomax and follow-up in 3 months PVR, and may be interested in considering cystolitholapaxy as well as HOLEP at that time.  We discussed that typically in patients with bladder stones we offer an outlet procedure simultaneously to prevent recurrent episodes of bladder stones or  worsening urinary symptoms.  We also discussed the possibility of additional pathology that could require additional intervention like bladder tumor.  In terms of ED, it sounds like he is taking 20 to 30 mg of Cialis as needed with moderate results.  We discussed the max dose is 20 mg/day.  He wonders if he can take max dosage of the sildenafil and Cialis.  Risks were discussed at length, and I think it be reasonable to try 10 mg of Cialis with 50 mg of sildenafil simultaneously to see if this helps improve his ED.  He was previously on penile injections which worked well, but he is not interested in resuming those at this time.  Trial of Flomax for urinary symptoms Trial of Cialis 10 mg with sildenafil 50 mg as needed for ED, risk reviewed extensively RTC 3 months PVR and symptom check-> he remains very hesitant to consider surgery for his bladder stone or BPH   Sondra Come, MD  [facility] [address], [address] [place], [place] [address] [phone]

## 2023-06-06 NOTE — Patient Instructions (Signed)
 The maximum dose of Cialis is 20 mg daily.  The maximum dose of sildenafil is 100 mg daily.  Some patients will take a 10 mg Cialis and 50 mg sildenafil at the same time which can help with erections, if you have bothersome headache or significant changes in your vision do not take those medications together.

## 2023-08-29 ENCOUNTER — Ambulatory Visit (INDEPENDENT_AMBULATORY_CARE_PROVIDER_SITE_OTHER): Admitting: Urology

## 2023-08-29 VITALS — BP 123/70 | HR 69 | Ht 72.0 in | Wt 205.0 lb

## 2023-08-29 DIAGNOSIS — N529 Male erectile dysfunction, unspecified: Secondary | ICD-10-CM

## 2023-08-29 DIAGNOSIS — N138 Other obstructive and reflux uropathy: Secondary | ICD-10-CM

## 2023-08-29 DIAGNOSIS — R399 Unspecified symptoms and signs involving the genitourinary system: Secondary | ICD-10-CM | POA: Diagnosis not present

## 2023-08-29 DIAGNOSIS — N200 Calculus of kidney: Secondary | ICD-10-CM

## 2023-08-29 DIAGNOSIS — N401 Enlarged prostate with lower urinary tract symptoms: Secondary | ICD-10-CM

## 2023-08-29 DIAGNOSIS — Z87448 Personal history of other diseases of urinary system: Secondary | ICD-10-CM | POA: Diagnosis not present

## 2023-08-29 LAB — BLADDER SCAN AMB NON-IMAGING

## 2023-08-29 MED ORDER — TADALAFIL 20 MG PO TABS: 10.0000 mg | ORAL_TABLET | Freq: Every day | ORAL | 6 refills | Status: DC | PRN

## 2023-08-29 MED ORDER — SILDENAFIL CITRATE 50 MG PO TABS: 50.0000 mg | ORAL_TABLET | Freq: Every day | ORAL | 6 refills | Status: DC | PRN

## 2023-08-29 MED ORDER — TAMSULOSIN HCL 0.4 MG PO CAPS: 0.4000 mg | ORAL_CAPSULE | Freq: Every day | ORAL | 11 refills | Status: AC

## 2023-08-29 NOTE — Progress Notes (Signed)
   08/29/2023 9:05 AM   Tommy Rowe 28-Jun-1955 [id number]  Reason for visit: Follow up gross hematuria, bladder stone, ED  HPI: 68 year old male with history of MI and cardiac stent placement in June 2023, he was seen in the ER November 2023 with gross hematuria(on eliquis ) and urinary retention, ultimately passed a voiding trial a few weeks later.  He did have some problems with initial catheter clotting off.  CT urogram in December 2023 showed a 70 g prostate with an 8 mm bladder stone but no other abnormalities.  He no showed and ultimately refused cystoscopy for further evaluation of gross hematuria.  He opted for surveillance for his bladder stone and deferred bladder stone removal or outlet procedure.  He ultimately was agreeable to start Flomax  in March 2025.  He also has ED, previously was on penile injections, currently taking Cialis  and sildenafil  combination therapy with good results.  He denies any changes since our last visit.  Urination has improved on the Flomax  and he really has no urinary complaints today.  PVR borderline at .  He remains resistant to considering bladder stone removal or outlet procedures at this time.  Risks were discussed including retention, hematuria, infections.  Return precautions discussed extensively.  He would like to continue Flomax .  He is using 10 mg Cialis  with 50 mg sildenafil  together with good results.  Nitro is on his medication list, but he has never taken this medicine.  He remains on Eliquis  for CAD.  He defers PSA screening, risk of benefits were discussed.  Flomax , Cialis , sildenafil  refilled RTC 1 year PVR, KUB prior for bladder stone surveillance  Lawerence Pressman, MD  [facility] [address], [address] [place], [place] [address] [phone]

## 2023-08-29 NOTE — Addendum Note (Signed)
 Addended by: Karyl Paget on: 08/29/2023 09:25 AM   Modules accepted: Orders

## 2023-09-04 ENCOUNTER — Encounter: Payer: Self-pay | Admitting: Emergency Medicine

## 2023-09-04 ENCOUNTER — Other Ambulatory Visit: Payer: Self-pay

## 2023-09-04 ENCOUNTER — Observation Stay
Admission: EM | Admit: 2023-09-04 | Discharge: 2023-09-05 | Disposition: A | Discharge status: Home / Self Care | Attending: Internal Medicine | Admitting: Internal Medicine

## 2023-09-04 DIAGNOSIS — I2510 Atherosclerotic heart disease of native coronary artery without angina pectoris: Secondary | ICD-10-CM | POA: Insufficient documentation

## 2023-09-04 DIAGNOSIS — N400 Enlarged prostate without lower urinary tract symptoms: Secondary | ICD-10-CM | POA: Diagnosis not present

## 2023-09-04 DIAGNOSIS — I2489 Other forms of acute ischemic heart disease: Secondary | ICD-10-CM | POA: Insufficient documentation

## 2023-09-04 DIAGNOSIS — I10 Essential (primary) hypertension: Secondary | ICD-10-CM | POA: Diagnosis present

## 2023-09-04 DIAGNOSIS — E0590 Thyrotoxicosis, unspecified without thyrotoxic crisis or storm: Secondary | ICD-10-CM

## 2023-09-04 DIAGNOSIS — I110 Hypertensive heart disease with heart failure: Secondary | ICD-10-CM | POA: Insufficient documentation

## 2023-09-04 DIAGNOSIS — Z955 Presence of coronary angioplasty implant and graft: Secondary | ICD-10-CM | POA: Insufficient documentation

## 2023-09-04 DIAGNOSIS — I4891 Unspecified atrial fibrillation: Secondary | ICD-10-CM | POA: Diagnosis not present

## 2023-09-04 DIAGNOSIS — Z8619 Personal history of other infectious and parasitic diseases: Secondary | ICD-10-CM | POA: Diagnosis not present

## 2023-09-04 DIAGNOSIS — Z79899 Other long term (current) drug therapy: Secondary | ICD-10-CM | POA: Diagnosis not present

## 2023-09-04 DIAGNOSIS — I5042 Chronic combined systolic (congestive) and diastolic (congestive) heart failure: Secondary | ICD-10-CM | POA: Insufficient documentation

## 2023-09-04 DIAGNOSIS — F419 Anxiety disorder, unspecified: Secondary | ICD-10-CM | POA: Diagnosis not present

## 2023-09-04 DIAGNOSIS — Z87891 Personal history of nicotine dependence: Secondary | ICD-10-CM | POA: Diagnosis not present

## 2023-09-04 DIAGNOSIS — Z7901 Long term (current) use of anticoagulants: Secondary | ICD-10-CM | POA: Insufficient documentation

## 2023-09-04 DIAGNOSIS — E785 Hyperlipidemia, unspecified: Secondary | ICD-10-CM | POA: Diagnosis not present

## 2023-09-04 DIAGNOSIS — R000 Tachycardia, unspecified: Secondary | ICD-10-CM | POA: Diagnosis present

## 2023-09-04 DIAGNOSIS — I480 Paroxysmal atrial fibrillation: Principal | ICD-10-CM | POA: Diagnosis present

## 2023-09-04 LAB — CBC WITH DIFFERENTIAL/PLATELET
Abs Immature Granulocytes: 0.01 10*3/uL (ref 0.00–0.07)
Basophils Absolute: 0 10*3/uL (ref 0.0–0.1)
Basophils Relative: 1 %
Eosinophils Absolute: 0.1 10*3/uL (ref 0.0–0.5)
Eosinophils Relative: 1 %
HCT: 43.2 % (ref 39.0–52.0)
Hemoglobin: 14.9 g/dL (ref 13.0–17.0)
Immature Granulocytes: 0 %
Lymphocytes Relative: 26 %
Lymphs Abs: 1.2 10*3/uL (ref 0.7–4.0)
MCH: 32 pg (ref 26.0–34.0)
MCHC: 34.5 g/dL (ref 30.0–36.0)
MCV: 92.7 fL (ref 80.0–100.0)
Monocytes Absolute: 0.5 10*3/uL (ref 0.1–1.0)
Monocytes Relative: 11 %
Neutro Abs: 2.9 10*3/uL (ref 1.7–7.7)
Neutrophils Relative %: 61 %
Platelets: 137 10*3/uL — ABNORMAL LOW (ref 150–400)
RBC: 4.66 MIL/uL (ref 4.22–5.81)
RDW: 12.2 % (ref 11.5–15.5)
WBC: 4.7 10*3/uL (ref 4.0–10.5)
nRBC: 0 % (ref 0.0–0.2)

## 2023-09-04 LAB — COMPREHENSIVE METABOLIC PANEL WITH GFR
ALT: 29 U/L (ref 0–44)
AST: 30 U/L (ref 15–41)
Albumin: 3.9 g/dL (ref 3.5–5.0)
Alkaline Phosphatase: 53 U/L (ref 38–126)
Anion gap: 5 (ref 5–15)
BUN: 29 mg/dL — ABNORMAL HIGH (ref 8–23)
CO2: 26 mmol/L (ref 22–32)
Calcium: 8.7 mg/dL — ABNORMAL LOW (ref 8.9–10.3)
Chloride: 108 mmol/L (ref 98–111)
Creatinine, Ser: 0.67 mg/dL (ref 0.61–1.24)
GFR, Estimated: >60 mL/min (ref >60)
Glucose, Bld: 125 mg/dL — ABNORMAL HIGH (ref 70–99)
Potassium: 4.2 mmol/L (ref 3.5–5.1)
Sodium: 139 mmol/L (ref 135–145)
Total Bilirubin: 1.4 mg/dL — ABNORMAL HIGH (ref 0.0–1.2)
Total Protein: 6.8 g/dL (ref 6.5–8.1)

## 2023-09-04 LAB — MRSA NEXT GEN BY PCR, NASAL: MRSA by PCR Next Gen: NOT DETECTED

## 2023-09-04 LAB — MAGNESIUM: Magnesium: 2.2 mg/dL (ref 1.7–2.4)

## 2023-09-04 LAB — T4, FREE: Free T4: 2.21 ng/dL — ABNORMAL HIGH (ref 0.61–1.12)

## 2023-09-04 LAB — TROPONIN I (HIGH SENSITIVITY)
Troponin I (High Sensitivity): 65 ng/L — ABNORMAL HIGH (ref <18)
Troponin I (High Sensitivity): 70 ng/L — ABNORMAL HIGH (ref <18)

## 2023-09-04 LAB — PROTIME-INR
INR: 1.3 — ABNORMAL HIGH (ref 0.8–1.2)
Prothrombin Time: 15.9 s — ABNORMAL HIGH (ref 11.4–15.2)

## 2023-09-04 LAB — TSH: TSH: 0.017 u[IU]/mL — ABNORMAL LOW (ref 0.350–4.500)

## 2023-09-04 LAB — GLUCOSE, CAPILLARY: Glucose-Capillary: 126 mg/dL — ABNORMAL HIGH (ref 70–99)

## 2023-09-04 LAB — BRAIN NATRIURETIC PEPTIDE: B Natriuretic Peptide: 154.9 pg/mL — ABNORMAL HIGH (ref 0.0–100.0)

## 2023-09-04 MED ORDER — HYDRALAZINE HCL 20 MG/ML IJ SOLN: 10.0000 mg | Freq: Four times a day (QID) | INTRAMUSCULAR | Status: DC | PRN

## 2023-09-04 MED ORDER — POLYETHYLENE GLYCOL 3350 17 G PO PACK: 17.0000 g | PACK | Freq: Every day | ORAL | Status: DC | PRN

## 2023-09-04 MED ORDER — LACTATED RINGERS IV BOLUS
1000.0000 mL | Freq: Once | INTRAVENOUS | Status: AC
  Administered 2023-09-04: 1000 mL via INTRAVENOUS

## 2023-09-04 MED ORDER — AMIODARONE IV BOLUS ONLY 150 MG/100ML
150.0000 mg | Freq: Once | INTRAVENOUS | Status: AC
  Administered 2023-09-04: 150 mg via INTRAVENOUS

## 2023-09-04 MED ORDER — AMIODARONE LOAD VIA INFUSION
150.0000 mg | Freq: Once | INTRAVENOUS | Status: AC
  Administered 2023-09-04: 150 mg via INTRAVENOUS
  Filled 2023-09-04: qty 83.34

## 2023-09-04 MED ORDER — METOPROLOL TARTRATE 25 MG PO TABS
12.5000 mg | ORAL_TABLET | Freq: Two times a day (BID) | ORAL | Status: DC
  Administered 2023-09-04 – 2023-09-05 (×2): 12.5 mg via ORAL
  Filled 2023-09-04 (×2): qty 1

## 2023-09-04 MED ORDER — AMIODARONE HCL IN DEXTROSE 360-4.14 MG/200ML-% IV SOLN
60.0000 mg/h | INTRAVENOUS | Status: DC
  Administered 2023-09-04 (×2): 60 mg/h via INTRAVENOUS
  Filled 2023-09-04: qty 200

## 2023-09-04 MED ORDER — ACETAMINOPHEN 325 MG PO TABS
650.0000 mg | ORAL_TABLET | Freq: Four times a day (QID) | ORAL | Status: DC | PRN
Start: 2023-09-04 — End: 2023-09-05

## 2023-09-04 MED ORDER — SODIUM CHLORIDE 0.9 % IV SOLN: INTRAVENOUS | Status: DC

## 2023-09-04 MED ORDER — AMIODARONE HCL 200 MG PO TABS
400.0000 mg | ORAL_TABLET | Freq: Three times a day (TID) | ORAL | Status: DC
  Administered 2023-09-04 (×2): 400 mg via ORAL
  Filled 2023-09-04 (×2): qty 2

## 2023-09-04 MED ORDER — AMIODARONE HCL IN DEXTROSE 360-4.14 MG/200ML-% IV SOLN
30.0000 mg/h | INTRAVENOUS | Status: DC
  Administered 2023-09-04: 30 mg/h via INTRAVENOUS

## 2023-09-04 MED ORDER — ETOMIDATE 2 MG/ML IV SOLN
0.3000 mg/kg | Freq: Once | INTRAVENOUS | Status: DC
  Filled 2023-09-04: qty 20

## 2023-09-04 MED ORDER — ALBUTEROL SULFATE (2.5 MG/3ML) 0.083% IN NEBU: 2.5000 mg | INHALATION_SOLUTION | Freq: Four times a day (QID) | RESPIRATORY_TRACT | Status: DC | PRN

## 2023-09-04 MED ORDER — ETOMIDATE 2 MG/ML IV SOLN
0.1000 mg/kg | Freq: Once | INTRAVENOUS | Status: AC
  Administered 2023-09-04: 9.3 mg via INTRAVENOUS

## 2023-09-04 MED ORDER — ETOMIDATE 2 MG/ML IV SOLN
INTRAVENOUS | Status: AC | PRN
  Administered 2023-09-04: 9.3 mg via INTRAVENOUS

## 2023-09-04 MED ORDER — ROSUVASTATIN CALCIUM 10 MG PO TABS
20.0000 mg | ORAL_TABLET | Freq: Every day | ORAL | Status: DC
  Administered 2023-09-04: 20 mg via ORAL
  Filled 2023-09-04: qty 2

## 2023-09-04 MED ORDER — BISACODYL 5 MG PO TBEC
5.0000 mg | DELAYED_RELEASE_TABLET | Freq: Every day | ORAL | Status: DC | PRN
Start: 2023-09-04 — End: 2023-09-05

## 2023-09-04 MED ORDER — CHLORHEXIDINE GLUCONATE CLOTH 2 % EX PADS
6.0000 | MEDICATED_PAD | Freq: Every day | CUTANEOUS | Status: DC
  Administered 2023-09-04 – 2023-09-05 (×2): 6 via TOPICAL

## 2023-09-04 MED ORDER — APIXABAN 5 MG PO TABS
5.0000 mg | ORAL_TABLET | Freq: Two times a day (BID) | ORAL | Status: DC
  Administered 2023-09-04 – 2023-09-05 (×2): 5 mg via ORAL
  Filled 2023-09-04 (×3): qty 1

## 2023-09-04 MED ORDER — ACETAMINOPHEN 650 MG RE SUPP: 650.0000 mg | Freq: Four times a day (QID) | RECTAL | Status: DC | PRN

## 2023-09-04 MED ORDER — METHIMAZOLE 5 MG PO TABS
5.0000 mg | ORAL_TABLET | Freq: Three times a day (TID) | ORAL | Status: DC
  Administered 2023-09-04 – 2023-09-05 (×2): 5 mg via ORAL
  Filled 2023-09-04 (×2): qty 1

## 2023-09-04 MED ORDER — ESCITALOPRAM OXALATE 20 MG PO TABS
20.0000 mg | ORAL_TABLET | Freq: Every day | ORAL | Status: DC
  Administered 2023-09-04: 20 mg via ORAL
  Filled 2023-09-04: qty 1

## 2023-09-04 MED ORDER — ORAL CARE MOUTH RINSE
15.0000 mL | OROMUCOSAL | Status: DC | PRN
Start: 2023-09-04 — End: 2023-09-05

## 2023-09-04 MED ORDER — METHIMAZOLE 5 MG PO TABS: 15.0000 mg | ORAL_TABLET | Freq: Every day | ORAL | Status: DC

## 2023-09-04 NOTE — ED Triage Notes (Signed)
 Pt via POV from home. Pt c/o tachycardiac,reports bending over and standing up and he became dizzy. Report HR in 130-150s Denies pain or palpitation. Stated that this happened a year ago and had to be cardioverted. Pt has a hx of afib, takes Eliquis  and took Eliquis . Pt is A&Ox4 and NAD, ambulatory to triage.

## 2023-09-04 NOTE — Sedation Documentation (Signed)
 Synch cardiovert 120 j

## 2023-09-04 NOTE — ED Notes (Signed)
 CCMD called by this RN to place pt on cardiac monitoring

## 2023-09-04 NOTE — Sedation Documentation (Signed)
 Synch cardiovert 100j

## 2023-09-04 NOTE — H&P (Addendum)
 0. [facility] History and Physical  PAO HAFFEY ZOX:[id number] DOB: 1955/09/28 DOA: 09/04/2023 PCP: [facility]  Presented from: Home Chief Complaint: Dizziness  History of Present Illness: Tommy Rowe is a 68 y.o. male with PMH significant for HTN, HLD, CAD/stent paroxysmal A-fib on Eliquis , anxiety Patient presented to the ED today with complaint of palpitation, lightheadedness this morning. In June 2023, patient had presented with unstable angina symptoms with troponin elevated over 15,000.  Underwent cardiac cath which showed 100% occlusion of the mid LAD and underwent PCI and stent.  Echocardiogram at that time showed EF of 45 to 50% with apical akinesis.  Hospitalization was also complicated by A-fib with RVR for which he needed cardioversion.  At discharge, he was continued on amiodarone  and anticoagulated with Eliquis . He follows up with cardiologist Dr. Beau Bound.  Repeat echocardiogram in April 2022 showed EF of 50 to 55%. Patient reports compliance to his meds including Eliquis .  He states amiodarone  was discontinued after sudden duration.  Currently not on any AV nodal blocking agent. Quit smoking 30 years ago, does not drink alcohol, denies any drugs. Consumes coffee. Has history of anxiety and is on Lexapro .  Patient denies any recent change in his medications.  Yesterday, he was outdoors, played 9 holes.  In the evening, he was feeling a little uncomfortable but did not check his blood pressure heart rate.  This morning, while bending forward and standing up too quickly, patient felt lightheaded, palpitation.  Noted his heart rate in 130s and hence presented to the ED.  In the ED, patient was afebrile, heart rate was in 130s.  After about an hour of initial presentation, heart rate picked up to 160s with blood pressure down to 80s. ED attending attempted synchronized cardioversion with 120 J x 3 without success. EDP spoke with cardiology on-call Dr.  Bob Burn who recommended amiodarone  drip, continue monitoring with the possibility of repeat trial of cardioversion later today. Hospitalist service was consulted for inpatient monitoring.  At the time of my evaluation, patient was propped up in ED gurney.   Not in distress.   On low-flow oxygen for comfort.  Heart rate in 130s, A-flutter.,  Blood pressure in 90s. Wife at bedside History reviewed and detailed as above.   Review of Systems:  All systems were reviewed and were negative unless otherwise mentioned in the HPI   Past medical history: Past Medical History:  Diagnosis Date   A-fib (HCC)    Anxiety    HLD (hyperlipidemia)    HTN (hypertension)     Past surgical history: Past Surgical History:  Procedure Laterality Date   CORONARY STENT INTERVENTION N/A 09/18/2021   Procedure: CORONARY STENT INTERVENTION;  Surgeon: Antonette Batters, MD;  Location: [facility] INVASIVE CV LAB;  Service: Cardiovascular;  Laterality: N/A;   FACIAL FRACTURE SURGERY  1980   LEFT HEART CATH AND CORONARY ANGIOGRAPHY Left 04/11/2021   Procedure: LEFT HEART CATH AND CORONARY ANGIOGRAPHY;  Surgeon: Cherrie Cornwall, MD;  Location: [facility] INVASIVE CV [facility];  Service: Cardiovascular;  Laterality: Left;   LEFT HEART CATH AND CORONARY ANGIOGRAPHY N/A 09/18/2021   Procedure: LEFT HEART CATH AND CORONARY ANGIOGRAPHY;  Surgeon: Antonette Batters, MD;  Location: [facility] INVASIVE CV [facility];  Service: Cardiovascular;  Laterality: N/A;   LUMBAR LAMINECTOMY/DECOMPRESSION MICRODISCECTOMY N/A 08/26/2019   Procedure: RIGHT L4-5 MICRODISCECTOMY;  Surgeon: Jodeen Munch, MD;  Location: [facility] ORS;  Service: Neurosurgery;  Laterality: N/A;    Social History:  reports that  he quit smoking about 35 years ago. His smoking use included cigarettes. He has been exposed to tobacco smoke. He has never used smokeless tobacco. He reports that he does not drink alcohol and does not use drugs.  Allergies:  No Known Allergies Patient has no  known allergies.   Family history:  Family History  Problem Relation Age of Onset   COPD Mother    Heart attack Father    Valvular heart disease Sister      Physical Exam: Vitals:   09/04/23 1500 09/04/23 1530 09/04/23 1600 09/04/23 1630  BP: 111/67 107/61 112/69 115/67  Pulse: (!) 59 (!) 55 (!) 58 (!) 59  Resp: 15 16 13 20   Temp:      TempSrc:      SpO2: 96% 95% 95% 96%  Weight:      Height:       Wt Readings from Last 3 Encounters:  09/04/23 93 kg  08/29/23 93 kg  06/06/23 93 kg   Body mass index is 27.8 kg/m.  General exam: Pleasant, middle-aged Caucasian male.  Not in distress currently Skin: No rashes, lesions or ulcers. HEENT: Atraumatic, normocephalic, no obvious bleeding Lungs: Clear to auscultation bilaterally,  CVS: Irregular tachycardia, no murmur GI/Abd: Soft, nontender, nondistended, bowel sound present,   CNS: Alert, awake more x 3 Psychiatry: Mood appropriate,  Extremities: No pedal edema, no calf tenderness,    ----------------------------------------------------------------------------------------------------------------------------------------- ----------------------------------------------------------------------------------------------------------------------------------------- -----------------------------------------------------------------------------------------------------------------------------------------  Assessment/Plan: A-fib/flutter with RVR H/o paroxysmal A-fib Presented with palpitation, lightheadedness, dizziness Noted to be in A-fib/flutter with RVR up to 160s. Because of dropping blood pressure down to 80s, cardioversion was tried in the ED without success. Cardiology recommended amiodarone  drip and the possibility of reattempt to cardiovert later. Continue to monitor in telemetry Continue Eliquis  Obtain TSH  Demand ischemia  h/o CAD/stent 2023 HLD Troponin mildly elevated likely due to tachycardia. Continue to  monitor Continue Eliquis , Crestor  Recent Labs    09/04/23 0727 09/04/23 0927  TROPONINIHS 65* 70*   Hypertension On losartan  and Aldactone. Currently both on hold due to blood pressure being low.  BPH I would keep Flomax  on hold till blood pressure improves  Anxiety Lexapro    Addendum 4:40 pm TSH low at 0.017, free T4 low at 2.21.. Ordered thyroglobulin antibodies. Started on methimazole 5 mg TID, (recommended dose for mild hyperthyroidism per 'OpenEvidence')   Mobility: Encourage ambulation  Goals of care:   Code Status: Full Code    DVT prophylaxis: Eliquis   apixaban  (ELIQUIS ) tablet 5 mg   Antimicrobials: None Fluid: Start NS at 125 mL/h Consultants: Cardiology Family Communication: Wife at bedside  Status: Observation Level of care:  Stepdown Stepdown  Patient is from: Home  Diet: N.p.o. for now Diet Order             Diet Heart Fluid consistency: Thin  Diet effective now                    ------------------------------------------------------------------------------------- Severity of Illness: The appropriate patient status for this patient is OBSERVATION. Observation status is judged to be reasonable and necessary in order to provide the required intensity of service to ensure the patient's safety. The patient's presenting symptoms, physical exam findings, and initial radiographic and laboratory data in the context of their medical condition is felt to place them at decreased risk for further clinical deterioration. Furthermore, it is anticipated that the patient will be medically stable for discharge from the hospital within 2 midnights of admission.  -------------------------------------------------------------------------------------  Home Meds: Prior to Admission medications   Medication Sig Start Date End Date Taking? Authorizing Provider  ELIQUIS  5 MG TABS tablet Take 5 mg by mouth 2 (two) times daily. 08/17/21   [provider]   escitalopram  (LEXAPRO ) 20 MG tablet Take 20 mg by mouth at bedtime. 11/05/16   [provider]  losartan  (COZAAR ) 25 MG tablet Take 25 mg by mouth daily. 07/10/23   [provider]  Multiple Vitamin (MULTIVITAMIN WITH MINERALS) TABS tablet Take 1 tablet by mouth daily. Centrum Silver for Men 50+    [provider]  nitroGLYCERIN  (NITROSTAT ) 0.4 MG SL tablet Place 1 tablet (0.4 mg total) under the tongue every 5 (five) minutes as needed for chest pain. 09/19/21   Callwood, Dwayne D, MD  rosuvastatin  (CRESTOR ) 20 MG tablet Take 20 mg by mouth at bedtime.    [provider]  sildenafil  (VIAGRA ) 50 MG tablet Take 1 tablet (50 mg total) by mouth daily as needed for erectile dysfunction (take on empty stomach 30 minutes prior to sexual activity). 08/29/23   Lawerence Pressman, MD  tadalafil  (CIALIS ) 20 MG tablet Take 0.5-1 tablets (10-20 mg total) by mouth daily as needed for erectile dysfunction (take 45 minutes prior to sexual activity). 08/29/23   Lawerence Pressman, MD  tamsulosin  (FLOMAX ) 0.4 MG CAPS capsule Take 1 capsule (0.4 mg total) by mouth daily. 08/29/23   Lawerence Pressman, MD    Labs on Admission:   CBC: Recent Labs  Lab 09/04/23 0727  WBC 4.7  NEUTROABS 2.9  HGB 14.9  HCT 43.2  MCV 92.7  PLT 137*    Basic Metabolic Panel: Recent Labs  Lab 09/04/23 0727 09/04/23 0927  NA 139  --   K 4.2  --   CL 108  --   CO2 26  --   GLUCOSE 125*  --   BUN 29*  --   CREATININE 0.67  --   CALCIUM  8.7*  --   MG  --  2.2    Liver Function Tests: Recent Labs  Lab 09/04/23 0727  AST 30  ALT 29  ALKPHOS 53  BILITOT 1.4*  PROT 6.8  ALBUMIN 3.9   No results for input(s): "LIPASE", "AMYLASE" in the last 168 hours. No results for input(s): "AMMONIA" in the last 168 hours.  Cardiac Enzymes: No results for input(s): "CKTOTAL", "CKMB", "CKMBINDEX", "TROPONINI" in the last 168 hours.  BNP (last 3 results) Recent Labs    09/04/23 0727  BNP 154.9*     ProBNP (last 3 results) No results for input(s): "PROBNP" in the last 8760 hours.  CBG: Recent Labs  Lab 09/04/23 0940  GLUCAP 126*    Lipase  No results found for: "LIPASE"   Urinalysis    Component Value Date/Time   COLORURINE RED (A) 02/25/2022 0632   APPEARANCEUR CLOUDY (A) 02/25/2022 0632   LABSPEC 1.025 02/25/2022 0632   PHURINE  02/25/2022 0632    TEST NOT REPORTED DUE TO COLOR INTERFERENCE OF URINE PIGMENT   GLUCOSEU (A) 02/25/2022 0632    TEST NOT REPORTED DUE TO COLOR INTERFERENCE OF URINE PIGMENT   HGBUR (A) 02/25/2022 0632    TEST NOT REPORTED DUE TO COLOR INTERFERENCE OF URINE PIGMENT   BILIRUBINUR (A) 02/25/2022 0632    TEST NOT REPORTED DUE TO COLOR INTERFERENCE OF URINE PIGMENT   KETONESUR (A) 02/25/2022 0632    TEST NOT REPORTED DUE TO COLOR INTERFERENCE OF URINE PIGMENT   PROTEINUR (A) 02/25/2022 9604  TEST NOT REPORTED DUE TO COLOR INTERFERENCE OF URINE PIGMENT   NITRITE (A) 02/25/2022 0632    TEST NOT REPORTED DUE TO COLOR INTERFERENCE OF URINE PIGMENT   LEUKOCYTESUR (A) 02/25/2022 1610    TEST NOT REPORTED DUE TO COLOR INTERFERENCE OF URINE PIGMENT     Drugs of Abuse  No results found for: "LABOPIA", "COCAINSCRNUR", "LABBENZ", "AMPHETMU", "THCU", "LABBARB"    Radiological Exams on Admission: No results found.   Signed, Hoyt Macleod, MD [facility] 09/04/2023

## 2023-09-04 NOTE — Sedation Documentation (Signed)
 Cardiovert synch 100j

## 2023-09-04 NOTE — ED Provider Notes (Signed)
 [facility] Provider Note   Event Date/Time   First MD Initiated Contact with Patient 09/04/23 [phone]     (approximate) History  Tachycardia  HPI Tommy Rowe is a 68 y.o. male with a past medical history of paroxysmal atrial fibrillation and atrial flutter who presents complaining of tachycardia, lightheadedness, and palpitations that began after bending over and standing up too quickly.  Patient states that now his heart rate is in the 130-150s at home.  Patient states similar symptoms occurred 1 year prior to arrival and had to be cardioverted.  Patient states he takes Eliquis  and has taken it every day without missing doses over the last week. ROS: Patient currently denies any vision changes, tinnitus, difficulty speaking, facial droop, sore throat, chest pain, shortness of breath, abdominal pain, nausea/vomiting/diarrhea, dysuria, or weakness/numbness/paresthesias in any extremity   Physical Exam  Triage Vital Signs: ED Triage Vitals  Encounter Vitals Group     BP 09/04/23 0724 100/73     Systolic BP Percentile --      Diastolic BP Percentile --      Pulse Rate 09/04/23 0724 (!) 138     Resp 09/04/23 0724 18     Temp 09/04/23 0725 98.5 F (36.9 C)     Temp Source 09/04/23 0725 Oral     SpO2 09/04/23 0724 97 %     Weight 09/04/23 0719 205 lb (93 kg)     Height 09/04/23 0719 6' (1.829 m)     Head Circumference --      Peak Flow --      Pain Score 09/04/23 0719 0     Pain Loc --      Pain Education --      Exclude from Growth Chart --    Most recent vital signs: Vitals:   09/04/23 1230 09/04/23 1300  BP: 107/61 102/64  Pulse: (!) 57 (!) 57  Resp: 16 15  Temp:    SpO2: 93% 96%   General: Awake, oriented x4. CV:  Good peripheral perfusion. Resp:  Normal effort. Abd:  No distention. Other:  Middle-aged overweight Caucasian male resting comfortably in no acute distress ED Results / Procedures / Treatments  Labs (all labs ordered are listed,  but only abnormal results are displayed) Labs Reviewed  COMPREHENSIVE METABOLIC PANEL WITH GFR - Abnormal; Notable for the following components:      Result Value   Glucose, Bld 125 (*)    BUN 29 (*)    Calcium  8.7 (*)    Total Bilirubin 1.4 (*)    All other components within normal limits  CBC WITH DIFFERENTIAL/PLATELET - Abnormal; Notable for the following components:   Platelets 137 (*)    All other components within normal limits  BRAIN NATRIURETIC PEPTIDE - Abnormal; Notable for the following components:   B Natriuretic Peptide 154.9 (*)    All other components within normal limits  PROTIME-INR - Abnormal; Notable for the following components:   Prothrombin Time 15.9 (*)    INR 1.3 (*)    All other components within normal limits  TSH - Abnormal; Notable for the following components:   TSH 0.017 (*)    All other components within normal limits  GLUCOSE, CAPILLARY - Abnormal; Notable for the following components:   Glucose-Capillary 126 (*)    All other components within normal limits  T4, FREE - Abnormal; Notable for the following components:   Free T4 2.21 (*)    All other components within  normal limits  TROPONIN I (HIGH SENSITIVITY) - Abnormal; Notable for the following components:   Troponin I (High Sensitivity) 65 (*)    All other components within normal limits  TROPONIN I (HIGH SENSITIVITY) - Abnormal; Notable for the following components:   Troponin I (High Sensitivity) 70 (*)    All other components within normal limits  MRSA NEXT GEN BY PCR, NASAL  MAGNESIUM  THYROID ANTIBODIES (THYROPEROXIDASE & THYROGLOBULIN)   EKG ED ECG REPORT I, Charleen Conn, the attending physician, personally viewed and interpreted this ECG. Date: 09/04/2023 EKG Time: 0726 Rate: 147 Rhythm: Atrial fibrillation with rapid ventricular response QRS Axis: normal Intervals: normal ST/T Wave abnormalities: normal Narrative Interpretation: Atrial fibrillation.  No evidence of acute  ischemia  PROCEDURES: Critical Care performed: No .1-3 Lead EKG Interpretation  Performed by: Charleen Conn, MD Authorized by: Charleen Conn, MD     Interpretation: abnormal     ECG rate:  131   ECG rate assessment: tachycardic     Rhythm: atrial fibrillation     Ectopy: none     Conduction: normal   .Sedation  Date/Time: 09/04/2023 3:26 PM  Performed by: Charleen Conn, MD Authorized by: Charleen Conn, MD   Consent:    Consent obtained:  Written   Risks discussed:  Allergic reaction, prolonged hypoxia resulting in organ damage, dysrhythmia, prolonged sedation necessitating reversal, inadequate sedation, respiratory compromise necessitating ventilatory assistance and intubation, nausea and vomiting   Alternatives discussed:  Analgesia without sedation, anxiolysis and regional anesthesia Universal protocol:    Immediately prior to procedure, a time out was called: yes   Indications:    Procedure necessitating sedation performed by:  Physician performing sedation Pre-sedation assessment:    Time since last food or drink:  Na   NPO status caution: urgency dictates proceeding with non-ideal NPO status     ASA classification: class 2 - patient with mild systemic disease     Mallampati score:  II - soft palate, uvula, fauces visible   Neck mobility: normal     Pre-sedation assessments completed and reviewed: airway patency, cardiovascular function, hydration status, mental status, nausea/vomiting, pain level, respiratory function and temperature   A pre-sedation assessment was completed prior to the start of the procedure Immediate pre-procedure details:    Reassessment: Patient reassessed immediately prior to procedure     Reviewed: vital signs, relevant labs/tests and NPO status     Verified: bag valve mask available, emergency equipment available, intubation equipment available, IV patency confirmed, oxygen available, reversal medications available and suction available    Procedure details (see MAR for exact dosages):    Preoxygenation:  Nasal cannula   Sedation:  Etomidate    Intended level of sedation: deep   Intra-procedure monitoring:  Blood pressure monitoring, continuous capnometry, frequent LOC assessments, frequent vital sign checks, continuous pulse oximetry and cardiac monitor   Intra-procedure events: none     Total Provider sedation time (minutes):  24 Post-procedure details:   A post-sedation assessment was completed following the completion of the procedure.   Attendance: Constant attendance by certified staff until patient recovered     Recovery: Patient returned to pre-procedure baseline     Post-sedation assessments completed and reviewed: airway patency, cardiovascular function, hydration status, mental status, nausea/vomiting, pain level, respiratory function and temperature     Patient is stable for discharge or admission: yes     Procedure completion:  Tolerated well, no immediate complications .Cardioversion  Date/Time: 09/04/2023 3:26 PM  Performed  by: Charleen Conn, MD Authorized by: Charleen Conn, MD   Consent:    Consent obtained:  Verbal and written   Consent given by:  Patient   Risks discussed:  Cutaneous burn, death, induced arrhythmia and pain   Alternatives discussed:  No treatment, rate-control medication, alternative treatment, referral, anti-coagulation medication, delayed treatment and observation Universal protocol:    Immediately prior to procedure a time out was called: yes   Pre-procedure details:    Cardioversion basis:  Emergent   Rhythm:  Atrial fibrillation   Electrode placement:  Anterior-posterior Patient sedated: Yes. Refer to sedation procedure documentation for details of sedation.  Attempt one:    Cardioversion mode:  Synchronous   Waveform:  Monophasic   Shock (Joules):  100   Shock outcome:  No change in rhythm Attempt two:    Cardioversion mode:  Synchronous   Waveform:  Monophasic    Shock (Joules):  100   Shock outcome:  No change in rhythm Attempt three:    Cardioversion mode:  Synchronous   Waveform:  Monophasic   Shock (Joules):  120   Shock outcome:  No change in rhythm Post-procedure details:    Patient status:  Awake   Patient tolerance of procedure:  Tolerated well, no immediate complications CRITICAL CARE Performed by: Markeshia Giebel K Lema Heinkel  Total critical care time: 51 minutes  Critical care time was exclusive of separately billable procedures and treating other patients.  Critical care was necessary to treat or prevent imminent or life-threatening deterioration.  Critical care was time spent personally by me on the following activities: development of treatment plan with patient and/or surrogate as well as nursing, discussions with consultants, evaluation of patient's response to treatment, examination of patient, obtaining history from patient or surrogate, ordering and performing treatments and interventions, ordering and review of laboratory studies, ordering and review of radiographic studies, pulse oximetry and re-evaluation of patient's condition.  MEDICATIONS ORDERED IN ED: Medications  amiodarone  (NEXTERONE ) 1.8 mg/mL load via infusion 150 mg (150 mg Intravenous Bolus from Bag 09/04/23 0843)    Followed by  amiodarone  (NEXTERONE  PREMIX) 360-4.14 MG/200ML-% (1.8 mg/mL) IV infusion (60 mg/hr Intravenous New Bag/Given 09/04/23 1113)    Followed by  amiodarone  (NEXTERONE  PREMIX) 360-4.14 MG/200ML-% (1.8 mg/mL) IV infusion (30 mg/hr Intravenous New Bag/Given 09/04/23 1445)  acetaminophen  (TYLENOL ) tablet 650 mg (has no administration in time range)    Or  acetaminophen  (TYLENOL ) suppository 650 mg (has no administration in time range)  polyethylene glycol (MIRALAX  / GLYCOLAX ) packet 17 g (has no administration in time range)  bisacodyl  (DULCOLAX) EC tablet 5 mg (has no administration in time range)  albuterol (PROVENTIL) (2.5 MG/3ML) 0.083% nebulizer solution  2.5 mg (has no administration in time range)  hydrALAZINE  (APRESOLINE ) injection 10 mg (has no administration in time range)  0.9 %  sodium chloride  infusion ( Intravenous New Bag/Given 09/04/23 1116)  apixaban  (ELIQUIS ) tablet 5 mg (5 mg Oral Not Given 09/04/23 1113)  escitalopram  (LEXAPRO ) tablet 20 mg (has no administration in time range)  rosuvastatin  (CRESTOR ) tablet 20 mg (has no administration in time range)  amiodarone  (PACERONE ) tablet 400 mg (400 mg Oral Given 09/04/23 1113)  Chlorhexidine  Gluconate Cloth 2 % PADS 6 each (has no administration in time range)  Oral care mouth rinse (has no administration in time range)  lactated ringers  bolus 1,000 mL (0 mLs Intravenous Stopped 09/04/23 0904)  amiodarone  (NEXTERONE ) 1.5 mg/mL IV bolus only 150 mg (0 mg Intravenous Stopped 09/04/23 0852)  etomidate  (  AMIDATE ) injection 9.3 mg (9.3 mg Intravenous Given 09/04/23 0824)  etomidate  (AMIDATE ) injection (9.3 mg Intravenous Given 09/04/23 0824)   IMPRESSION / MDM / ASSESSMENT AND PLAN / ED COURSE  I reviewed the triage vital signs and the nursing notes.                             The patient is on the cardiac monitor to evaluate for evidence of arrhythmia and/or significant heart rate changes. Patient's presentation is most consistent with acute presentation with potential threat to life or bodily function. + atrial fibrillation w/ RVR DDx: Pneumothorax, Pneumonia, Pulmonary Embolus, Tamponade, ACS, Thyrotoxicosis.  No history or evidence decompensated heart failure. Given their history and exam it is likely this patient is unlikely to spontaneously revert to a rate controlled rhythm and necessitates a thorough workup for their arrhythmia. Workup: ECG, CXR, CBC, BMP, UA, Troponin, BNP, TSH, Ca-Mag-Phos Interventions: Cardioversion attempted x 3 despite amiodarone  bolus patient remained in A-fib with RVR.  Continue amiodarone  drip I spoke to the on-call cardiologist who recommended continuing amiodarone   drip with admission and possibly reattempting cardioversion  Disposition: Admit   FINAL CLINICAL IMPRESSION(S) / ED DIAGNOSES   Final diagnoses:  Atrial fibrillation with RVR (HCC)   Rx / DC Orders   ED Discharge Orders     None      Note:  This document was prepared using Dragon voice recognition software and may include unintentional dictation errors.   Mehlani Blankenburg K, MD 09/04/23 [phone]

## 2023-09-04 NOTE — Consult Note (Addendum)
 [facility] CLINIC CARDIOLOGY CONSULT NOTE       Patient ID: Tommy Rowe MRN: [id number] DOB/AGE: 1956-02-21 68 y.o.  Admit date: 09/04/2023 Referring Physician Dr. Hoyt Macleod Primary Physician Ll[facility], [facility] Primary Cardiologist Dr. Beau Bound Reason for Consultation AF/AFL RVR  HPI: Tommy Rowe is a 68 y.o. male  with a past medical history of paroxsymal atrial fibrillation (on Eliquis ), chronic combined systolic and diastolic heart failure, coronary artery disease s/p stent to LAD (08/2021), Hx NSTEMI, hypertension, hyperlipidemia, hx bradycardia, hx LBBB who presented to the ED on 09/04/2023 for lightheadedness/dizziness. Patient in atrial fibrillation RVR rates in 130s - 160s with soft blood pressures. Ed attending attempted synchronized cardioversion with 100J x2 then 120J x1 without success. Cardiology was consulted for further evaluation.   Patient presented to the ED with lightheadedness and palpitations and with atrial fibrillation RVR with rates 130s-160s. Work up in the ED notable for Na 139, K 4.2, Hgb 14.9, plts 137. BNP elevated at 150. Trops mildly elevated and trended flat 65 > 70. EKG in ED atrial fibrillation/flutter with LBBB rate 147 bpm. Cardioversion attempted at 100J x2 then 120J x1  without success. Amio bolus and infusion started in ED.   At the time of my evaluation this AM, patient was resting comfortably in hospital bed. Discussed patients sxs in further detail. Patient states when he got up from his chair at home he felt dizzy. Patient then checked his HR and it was in 140s then 150s which prompted patient to come to ED. Patient states he regularly checks his BP and HR and 2 days ago his HR was normal. Patient states the dizziness he felt today felt the same when he had atrial fibrillation before. Currently per tele patient converted to sinus rhythm s/p amio gtt and PO amio. Patient states he feels well without dizziness, lightheadedness, palpitations,  or SOB.    Review of systems complete and found to be negative unless listed above    Past Medical History:  Diagnosis Date   A-fib (HCC)    Anxiety    HLD (hyperlipidemia)    HTN (hypertension)     Past Surgical History:  Procedure Laterality Date   CORONARY STENT INTERVENTION N/A 09/18/2021   Procedure: CORONARY STENT INTERVENTION;  Surgeon: Antonette Batters, MD;  Location: [facility] INVASIVE CV LAB;  Service: Cardiovascular;  Laterality: N/A;   FACIAL FRACTURE SURGERY  1980   LEFT HEART CATH AND CORONARY ANGIOGRAPHY Left 04/11/2021   Procedure: LEFT HEART CATH AND CORONARY ANGIOGRAPHY;  Surgeon: Cherrie Cornwall, MD;  Location: [facility] INVASIVE CV LAB;  Service: Cardiovascular;  Laterality: Left;   LEFT HEART CATH AND CORONARY ANGIOGRAPHY N/A 09/18/2021   Procedure: LEFT HEART CATH AND CORONARY ANGIOGRAPHY;  Surgeon: Antonette Batters, MD;  Location: [facility] INVASIVE CV LAB;  Service: Cardiovascular;  Laterality: N/A;   LUMBAR LAMINECTOMY/DECOMPRESSION MICRODISCECTOMY N/A 08/26/2019   Procedure: RIGHT L4-5 MICRODISCECTOMY;  Surgeon: Jodeen Munch, MD;  Location: [facility] ORS;  Service: Neurosurgery;  Laterality: N/A;    Medications Prior to Admission  Medication Sig Dispense Refill Last Dose/Taking   spironolactone (ALDACTONE) 25 MG tablet Take 12.5 mg by mouth daily.   Taking   ELIQUIS  5 MG TABS tablet Take 5 mg by mouth 2 (two) times daily.      escitalopram  (LEXAPRO ) 20 MG tablet Take 20 mg by mouth at bedtime.  1    losartan  (COZAAR ) 25 MG tablet Take 25 mg by mouth daily.  Multiple Vitamin (MULTIVITAMIN WITH MINERALS) TABS tablet Take 1 tablet by mouth daily. Centrum Silver for Men 50+      nitroGLYCERIN  (NITROSTAT ) 0.4 MG SL tablet Place 1 tablet (0.4 mg total) under the tongue every 5 (five) minutes as needed for chest pain. 1 tablet 12    rosuvastatin  (CRESTOR ) 20 MG tablet Take 20 mg by mouth at bedtime.      sildenafil  (VIAGRA ) 50 MG tablet Take 1 tablet (50 mg total) by mouth  daily as needed for erectile dysfunction (take on empty stomach 30 minutes prior to sexual activity). 30 tablet 6    tadalafil  (CIALIS ) 20 MG tablet Take 0.5-1 tablets (10-20 mg total) by mouth daily as needed for erectile dysfunction (take 45 minutes prior to sexual activity). 30 tablet 6    tamsulosin  (FLOMAX ) 0.4 MG CAPS capsule Take 1 capsule (0.4 mg total) by mouth daily. 30 capsule 11    Social History   Socioeconomic History   Marital status: Married    Spouse name: Not on file   Number of children: Not on file   Years of education: Not on file   Highest education level: Not on file  Occupational History   Not on file  Tobacco Use   Smoking status: Former    Current packs/day: 0.00    Types: Cigarettes    Quit date: 04/02/1988    Years since quitting: 35.4    Passive exposure: Past   Smokeless tobacco: Never  Vaping Use   Vaping status: Not on file  Substance and Sexual Activity   Alcohol use: No   Drug use: Never   Sexual activity: Not on file  Other Topics Concern   Not on file  Social History Narrative   Not on file   Social Drivers of Health   Financial Resource Strain: Not on file  Food Insecurity: Not on file  Transportation Needs: Not on file  Physical Activity: Not on file  Stress: Not on file  Social Connections: Unknown (08/15/2021)   Received from [facility], [facility]   Social Network    Social Network: Not on file  Intimate Partner Violence: Unknown (07/07/2021)   Received from [facility], [facility]   HITS    Physically Hurt: Not on file    Insult or Talk Down To: Not on file    Threaten Physical Harm: Not on file    Scream or Curse: Not on file    Family History  Problem Relation Age of Onset   COPD Mother    Heart attack Father    Valvular heart disease Sister      Vitals:   09/04/23 0850 09/04/23 0855 09/04/23 0900 09/04/23 0910  BP: 97/77  97/79 101/74  Pulse: (!) 132 (!) 134 (!) 136 94  Resp: 13 15 17  (!) 24  Temp:     98.7 F (37.1 C)  TempSrc:    Oral  SpO2: 97% 99% 100% 98%  Weight:      Height:    6' (1.829 m)    PHYSICAL EXAM General: well appearing elderly male, well nourished, in no acute distress. HEENT: Normocephalic and atraumatic. Neck: No JVD.   Lungs: Normal respiratory effort on room air. Clear bilaterally to auscultation. No wheezes, crackles, rhonchi.  Heart: HRRR. Normal S1 and S2 without gallops or murmurs.  Abdomen: Non-distended appearing.  Msk: Normal strength and tone for age. Extremities: Warm and well perfused. No clubbing, cyanosis. No edema.  Neuro: Alert and oriented X  3. Psych: Answers questions appropriately.   Labs: Basic Metabolic Panel: Recent Labs    09/04/23 0727  NA 139  K 4.2  CL 108  CO2 26  GLUCOSE 125*  BUN 29*  CREATININE 0.67  CALCIUM  8.7*   Liver Function Tests: Recent Labs    09/04/23 0727  AST 30  ALT 29  ALKPHOS 53  BILITOT 1.4*  PROT 6.8  ALBUMIN 3.9   No results for input(s): "LIPASE", "AMYLASE" in the last 72 hours. CBC: Recent Labs    09/04/23 0727  WBC 4.7  NEUTROABS 2.9  HGB 14.9  HCT 43.2  MCV 92.7  PLT 137*   Cardiac Enzymes: Recent Labs    09/04/23 0727  TROPONINIHS 65*   BNP: Recent Labs    09/04/23 0727  BNP 154.9*   D-Dimer: No results for input(s): "DDIMER" in the last 72 hours. Hemoglobin A1C: No results for input(s): "HGBA1C" in the last 72 hours. Fasting Lipid Panel: No results for input(s): "CHOL", "HDL", "LDLCALC", "TRIG", "CHOLHDL", "LDLDIRECT" in the last 72 hours. Thyroid Function Tests: No results for input(s): "TSH", "T4TOTAL", "T3FREE", "THYROIDAB" in the last 72 hours.  Invalid input(s): "FREET3" Anemia Panel: No results for input(s): "VITAMINB12", "FOLATE", "FERRITIN", "TIBC", "IRON", "RETICCTPCT" in the last 72 hours.   Radiology: No results found.  ECHO ordered  TELEMETRY reviewed by me 09/04/2023: sinus bradycardia, rate 50s  EKG reviewed by me: atrial fibrillation/flutter  with LBBB rate 147 bpm  Data reviewed by me 09/04/2023: last 24h vitals tele labs imaging I/O ED provider note, admission H&P.  Principal Problem:   Atrial fibrillation with RVR (HCC)    ASSESSMENT AND PLAN:  Tommy Rowe is a 68 y.o. male  with a past medical history of paroxsymal atrial fibrillation (on Eliquis ), chronic HFpEF, coronary artery disease s/p stent to LAD (08/2021), Hx NSTEMI, hypertension, hyperlipidemia, hx bradycardia, hx LBBB who presented to the ED on 09/04/2023 for lightheadedness/dizziness. Patient in atrial fibrillation RVR rates in 130s - 160s with soft blood pressures. Ed attending attempted synchronized cardioversion with 100J x2 then 120J x1 without success. Cardiology was consulted for further evaluation.   # Atrial fibrillation/flutter RVR # Paroxsymal atrial fibrillation # Hx LBBB Patient presents with dizziness/lightheadedness with AF/AFL with RVR rates in 130s -160s. DCCV was attempted by ED attending with 100J x2 then 120J x1  without success. At the time of my evaluation per tele patient converted to sinus bradycardia in 50s (unclear when patient converted due to patient transferring rooms from ED to ICU). Electrolytes are stable. BP remains soft.  -Monitor and replenish electrolytes for a goal K >4, Mag >2  -Continue amio gtt. Consider transitioning to PO amio only tomorrow if patient remains in sinus.  -Ordered amio 400 mg TID.  -Limited options for rate control as BP is soft. (Per chart review patient has hx of not tolerating BB well). -Continue Eliquis  5 mg twice daily for stroke risk reduction.  -Plan for outpatient follow-up with EP to discuss possible ablation.   # Chronic HFpEF # Coronary artery disease s/p stent to LAD (08/2021) # Hypertension # Hyperlipidemia Prior echo on 07/2022 with pEF (50-55%), normal LV and RV systolic function, mild LVH, mild-mod MR, mild TR, mild LAE. BNP mildly elevated at 150. Trops mildly elevated and flat 65 > 70.   -Echo ordered. -Consider resuming home GDMT (losartan . spironolactone) when BP stabilizes. -Continue rosuvastatin  20 mg daily.  -Mildly elevated troponins and flat in setting for AF RVR is most consistent with  demand/supply mismatch and not ACS    This patient's plan of care was discussed and created with Dr. Bob Burn and he is in agreement.  Signed: Creighton Doffing, PA-C  09/04/2023, 9:50 AM [facility] Cardiology

## 2023-09-04 NOTE — Progress Notes (Signed)
 Spoke with [place] NP regarding patients heart rate in the 50's. Oral amiodarone  given earlier. Orders to continue amiodarone  drip at the 60 mg dosage and drop down to 30 mg per order. Continue to assess.

## 2023-09-05 ENCOUNTER — Other Ambulatory Visit: Payer: Self-pay

## 2023-09-05 DIAGNOSIS — I10 Essential (primary) hypertension: Secondary | ICD-10-CM | POA: Diagnosis not present

## 2023-09-05 DIAGNOSIS — I4891 Unspecified atrial fibrillation: Secondary | ICD-10-CM | POA: Diagnosis not present

## 2023-09-05 DIAGNOSIS — I480 Paroxysmal atrial fibrillation: Secondary | ICD-10-CM | POA: Diagnosis not present

## 2023-09-05 DIAGNOSIS — E0590 Thyrotoxicosis, unspecified without thyrotoxic crisis or storm: Secondary | ICD-10-CM | POA: Diagnosis not present

## 2023-09-05 LAB — CBC
HCT: 38.2 % — ABNORMAL LOW (ref 39.0–52.0)
Hemoglobin: 13.3 g/dL (ref 13.0–17.0)
MCH: 32.5 pg (ref 26.0–34.0)
MCHC: 34.8 g/dL (ref 30.0–36.0)
MCV: 93.4 fL (ref 80.0–100.0)
Platelets: 118 10*3/uL — ABNORMAL LOW (ref 150–400)
RBC: 4.09 MIL/uL — ABNORMAL LOW (ref 4.22–5.81)
RDW: 12.3 % (ref 11.5–15.5)
WBC: 5.5 10*3/uL (ref 4.0–10.5)
nRBC: 0 % (ref 0.0–0.2)

## 2023-09-05 LAB — BASIC METABOLIC PANEL WITH GFR
Anion gap: 5 (ref 5–15)
BUN: 23 mg/dL (ref 8–23)
CO2: 22 mmol/L (ref 22–32)
Calcium: 8 mg/dL — ABNORMAL LOW (ref 8.9–10.3)
Chloride: 112 mmol/L — ABNORMAL HIGH (ref 98–111)
Creatinine, Ser: 0.66 mg/dL (ref 0.61–1.24)
GFR, Estimated: >60 mL/min (ref >60)
Glucose, Bld: 97 mg/dL (ref 70–99)
Potassium: 3.6 mmol/L (ref 3.5–5.1)
Sodium: 139 mmol/L (ref 135–145)

## 2023-09-05 LAB — HIV ANTIBODY (ROUTINE TESTING W REFLEX): HIV Screen 4th Generation wRfx: NONREACTIVE

## 2023-09-05 LAB — THYROID ANTIBODIES (THYROPEROXIDASE & THYROGLOBULIN)
Thyroglobulin Antibody: <1 [IU]/mL (ref 0.0–0.9)
Thyroperoxidase Ab SerPl-aCnc: 128 [IU]/mL — ABNORMAL HIGH (ref 0–34)

## 2023-09-05 MED ORDER — METOPROLOL TARTRATE 25 MG PO TABS
12.5000 mg | ORAL_TABLET | Freq: Two times a day (BID) | ORAL | 0 refills | Status: DC
  Filled 2023-09-05: qty 30, 30d supply, fill #0

## 2023-09-05 MED ORDER — METHIMAZOLE 5 MG PO TABS
5.0000 mg | ORAL_TABLET | Freq: Three times a day (TID) | ORAL | 0 refills | Status: DC
Start: 2023-09-05 — End: 2023-10-14
  Filled 2023-09-05: qty 90, 30d supply, fill #0

## 2023-09-05 NOTE — Progress Notes (Signed)
 Pt discharged home via wife driving him, all belongings sent with patient (cell phone), Aox4, Vitals WNL, all questions answered, meds from [facility] pharmacy sent with patient as well, all lines removed

## 2023-09-05 NOTE — Progress Notes (Signed)
 [facility] CLINIC CARDIOLOGY PROGRESS NOTE       Patient ID: MONTAGUE CORELLA MRN: [id number] DOB/AGE: 1956/01/15 68 y.o.  Admit date: 09/04/2023 Referring Physician Dr. Hoyt Macleod Primary Physician Suzzane Estes, DO Primary Cardiologist Dr. Beau Bound Reason for Consultation AF/AFL RVR  HPI: Tommy Rowe is a 68 y.o. male  with a past medical history of paroxsymal atrial fibrillation (on Eliquis ), chronic combined systolic and diastolic heart failure, coronary artery disease s/p stent to LAD (08/2021), Hx NSTEMI, hypertension, hyperlipidemia, hx bradycardia, hx LBBB who presented to the ED on 09/04/2023 for lightheadedness/dizziness. Patient in atrial fibrillation RVR rates in 130s - 160s with soft blood pressures. Ed attending attempted synchronized cardioversion with 100J x2 then 120J x1 without success. Cardiology was consulted for further evaluation.   Interval History: -Patient seen and examined this AM and laying comfortably in hospital bed. Patient states he feels well and denies chest pain, palpitations or dizziness.  -Patients BP improved and HR  stable this AM. Per tele patient remains in SR with rates in 60s. -Patient remains on room air with stable SpO2.     Review of systems complete and found to be negative unless listed above    Past Medical History:  Diagnosis Date   A-fib (HCC)    Anxiety    HLD (hyperlipidemia)    HTN (hypertension)     Past Surgical History:  Procedure Laterality Date   CORONARY STENT INTERVENTION N/A 09/18/2021   Procedure: CORONARY STENT INTERVENTION;  Surgeon: Antonette Batters, MD;  Location: [facility] INVASIVE CV LAB;  Service: Cardiovascular;  Laterality: N/A;   FACIAL FRACTURE SURGERY  1980   LEFT HEART CATH AND CORONARY ANGIOGRAPHY Left 04/11/2021   Procedure: LEFT HEART CATH AND CORONARY ANGIOGRAPHY;  Surgeon: Cherrie Cornwall, MD;  Location: [facility] INVASIVE CV LAB;  Service: Cardiovascular;  Laterality: Left;   LEFT HEART CATH AND CORONARY  ANGIOGRAPHY N/A 09/18/2021   Procedure: LEFT HEART CATH AND CORONARY ANGIOGRAPHY;  Surgeon: Antonette Batters, MD;  Location: [facility] INVASIVE CV LAB;  Service: Cardiovascular;  Laterality: N/A;   LUMBAR LAMINECTOMY/DECOMPRESSION MICRODISCECTOMY N/A 08/26/2019   Procedure: RIGHT L4-5 MICRODISCECTOMY;  Surgeon: Jodeen Munch, MD;  Location: [facility] ORS;  Service: Neurosurgery;  Laterality: N/A;    Medications Prior to Admission  Medication Sig Dispense Refill Last Dose/Taking   ELIQUIS  5 MG TABS tablet Take 5 mg by mouth 2 (two) times daily.   09/04/2023   escitalopram  (LEXAPRO ) 20 MG tablet Take 20 mg by mouth at bedtime.  1 09/03/2023   losartan  (COZAAR ) 25 MG tablet Take 25 mg by mouth daily.   09/03/2023   Multiple Vitamin (MULTIVITAMIN WITH MINERALS) TABS tablet Take 1 tablet by mouth daily. Centrum Silver for Men 50+   09/03/2023   rosuvastatin  (CRESTOR ) 20 MG tablet Take 20 mg by mouth at bedtime.   09/03/2023   sildenafil  (VIAGRA ) 50 MG tablet Take 1 tablet (50 mg total) by mouth daily as needed for erectile dysfunction (take on empty stomach 30 minutes prior to sexual activity). 30 tablet 6 Taking As Needed   spironolactone (ALDACTONE) 25 MG tablet Take 12.5 mg by mouth daily.   09/03/2023   tadalafil  (CIALIS ) 20 MG tablet Take 0.5-1 tablets (10-20 mg total) by mouth daily as needed for erectile dysfunction (take 45 minutes prior to sexual activity). 30 tablet 6 Taking As Needed   tamsulosin  (FLOMAX ) 0.4 MG CAPS capsule Take 1 capsule (0.4 mg total) by mouth daily. 30 capsule 11 09/03/2023  nitroGLYCERIN  (NITROSTAT ) 0.4 MG SL tablet Place 1 tablet (0.4 mg total) under the tongue every 5 (five) minutes as needed for chest pain. 1 tablet 12    Social History   Socioeconomic History   Marital status: Married    Spouse name: Not on file   Number of children: Not on file   Years of education: Not on file   Highest education level: Not on file  Occupational History   Not on file  Tobacco Use    Smoking status: Former    Current packs/day: 0.00    Types: Cigarettes    Quit date: 04/02/1988    Years since quitting: 35.4    Passive exposure: Past   Smokeless tobacco: Never  Vaping Use   Vaping status: Not on file  Substance and Sexual Activity   Alcohol use: No   Drug use: Never   Sexual activity: Not on file  Other Topics Concern   Not on file  Social History Narrative   Not on file   Social Drivers of Health   Financial Resource Strain: Not on file  Food Insecurity: No Food Insecurity (09/04/2023)   Hunger Vital Sign    Worried About Running Out of Food in the Last Year: Never true    Ran Out of Food in the Last Year: Never true  Transportation Needs: No Transportation Needs (09/04/2023)   PRAPARE - Administrator, Civil Service (Medical): No    Lack of Transportation (Non-Medical): No  Physical Activity: Not on file  Stress: Not on file  Social Connections: Moderately Integrated (09/04/2023)   Social Connection and Isolation Panel [NHANES]    Frequency of Communication with Friends and Family: More than three times a week    Frequency of Social Gatherings with Friends and Family: More than three times a week    Attends Religious Services: More than 4 times per year    Active Member of [employer] or Organizations: No    Attends Banker Meetings: Never    Marital Status: Married  Catering manager Violence: Not At Risk (09/04/2023)   Humiliation, Afraid, Rape, and Kick questionnaire    Fear of Current or Ex-Partner: No    Emotionally Abused: No    Physically Abused: No    Sexually Abused: No    Family History  Problem Relation Age of Onset   COPD Mother    Heart attack Father    Valvular heart disease Sister      Vitals:   09/04/23 2150 09/05/23 0000 09/05/23 0727 09/05/23 0800  BP:   (!) 141/73 139/72  Pulse:   75 63  Resp:   19 18  Temp: 98.7 F (37.1 C) 98.3 F (36.8 C) 98.1 F (36.7 C)   TempSrc: Oral Oral Oral   SpO2:   (!) 89% 95%   Weight:      Height:        PHYSICAL EXAM General: well appearing elderly male, well nourished, in no acute distress. HEENT: Normocephalic and atraumatic. Neck: No JVD.   Lungs: Normal respiratory effort on room air. Clear bilaterally to auscultation. No wheezes, crackles, rhonchi.  Heart: HRRR. Normal S1 and S2 without gallops or murmurs.  Abdomen: Non-distended appearing.  Msk: Normal strength and tone for age. Extremities: Warm and well perfused. No clubbing, cyanosis. No edema.  Neuro: Alert and oriented X 3. Psych: Answers questions appropriately.   Labs: Basic Metabolic Panel: Recent Labs    09/04/23 0727 09/04/23 1610 09/05/23 0402  NA 139  --  139  K 4.2  --  3.6  CL 108  --  112*  CO2 26  --  22  GLUCOSE 125*  --  97  BUN 29*  --  23  CREATININE 0.67  --  0.66  CALCIUM  8.7*  --  8.0*  MG  --  2.2  --    Liver Function Tests: Recent Labs    09/04/23 0727  AST 30  ALT 29  ALKPHOS 53  BILITOT 1.4*  PROT 6.8  ALBUMIN 3.9   No results for input(s): "LIPASE", "AMYLASE" in the last 72 hours. CBC: Recent Labs    09/04/23 0727 09/05/23 0402  WBC 4.7 5.5  NEUTROABS 2.9  --   HGB 14.9 13.3  HCT 43.2 38.2*  MCV 92.7 93.4  PLT 137* 118*   Cardiac Enzymes: Recent Labs    09/04/23 0727 09/04/23 0927  TROPONINIHS 65* 70*   BNP: Recent Labs    09/04/23 0727  BNP 154.9*   D-Dimer: No results for input(s): "DDIMER" in the last 72 hours. Hemoglobin A1C: No results for input(s): "HGBA1C" in the last 72 hours. Fasting Lipid Panel: No results for input(s): "CHOL", "HDL", "LDLCALC", "TRIG", "CHOLHDL", "LDLDIRECT" in the last 72 hours. Thyroid Function Tests: Recent Labs    09/04/23 0927  TSH 0.017*   Anemia Panel: No results for input(s): "VITAMINB12", "FOLATE", "FERRITIN", "TIBC", "IRON", "RETICCTPCT" in the last 72 hours.   Radiology: No results found.  ECHO Will schedule outpatient.  TELEMETRY reviewed by me 09/05/2023: sinus bradycardia,  rate 60s  EKG reviewed by me: atrial fibrillation/flutter with LBBB rate 147 bpm  Data reviewed by me 09/05/2023: last 24h vitals tele labs imaging I/O ED provider note, admission H&P.  Principal Problem:   Atrial fibrillation with RVR (HCC) Active Problems:   Essential hypertension   Hyperthyroidism    ASSESSMENT AND PLAN:  Tommy Rowe is a 68 y.o. male  with a past medical history of paroxsymal atrial fibrillation (on Eliquis ), chronic HFpEF, coronary artery disease s/p stent to LAD (08/2021), Hx NSTEMI, hypertension, hyperlipidemia, hx bradycardia, hx LBBB who presented to the ED on 09/04/2023 for lightheadedness/dizziness. Patient in atrial fibrillation RVR rates in 130s - 160s with soft blood pressures. Ed attending attempted synchronized cardioversion with 100J x2 then 120J x1 without success. Cardiology was consulted for further evaluation.   # Atrial fibrillation/flutter RVR # Paroxsymal atrial fibrillation # Hx LBBB # Hyperthyroidism Patient presents with dizziness/lightheadedness with AF/AFL with RVR rates in 130s -160s. DCCV was attempted by ED attending with 100J x2 then 120J x1  without success. At the time of my evaluation per tele patient converted to sinus bradycardia in 50s (unclear when patient converted due to patient transferring rooms from ED to ICU). Electrolytes are stable. BP improved. -Monitor and replenish electrolytes for a goal K >4, Mag >2  -d/c amio due to new hyperthyroidism (TSH 0.017, T4 2.21) -Continue low dose metoprolol  tartrate 12.5 mg BID for rate control.  -Continue Eliquis  5 mg twice daily for stroke risk reduction.  -Plan for outpatient follow-up with EP to discuss possible ablation.  -Recommend referral to endocrinology to further evaluate hyperthyroidism.   # Chronic HFpEF # Coronary artery disease s/p stent to LAD (08/2021) # Hypertension # Hyperlipidemia Prior echo on 07/2022 with pEF (50-55%), normal LV and RV systolic function, mild LVH,  mild-mod MR, mild TR, mild LAE. BNP mildly elevated at 150. Trops mildly elevated and flat 65 > 70.  -Will  schedule outpatient echo.  -Consider resuming home GDMT (losartan . spironolactone) when BP stabilizes. This can be resumed outpatient as well. -Continue rosuvastatin  20 mg daily.  -Mildly elevated troponins and flat in setting for AF RVR is most consistent with demand/supply mismatch and not ACS   Ok for discharge today from a cardiac perspective. Will arrange for follow up in clinic with Dr. Beau Bound in 1-2 weeks.   This patient's plan of care was discussed and created with Dr. Bob Burn and he is in agreement.  Signed: Creighton Doffing, PA-C  09/05/2023, 8:42 AM [facility] Cardiology

## 2023-09-05 NOTE — Care Management Obs Status (Signed)
 MEDICARE OBSERVATION STATUS NOTIFICATION   Patient Details  Name: Tommy Rowe MRN: [id number] Date of Birth: 1956-03-18   Medicare Observation Status Notification Given:  Yes  Verbally explained.  Will mail pain notice.      Xaine Sansom A Romey Mathieson, RN 09/05/2023, 4:45 PM

## 2023-09-05 NOTE — Care Management CC44 (Signed)
 Condition Code 44 Documentation Completed  Patient Details  Name: Tommy Rowe MRN: [id number] Date of Birth: 03/15/1956   Condition Code 44 given:  Yes Patient signature on Condition Code 44 notice:  Yes Documentation of 2 MD's agreement:  Yes Code 44 added to claim:  Yes  Verbally explained to patient.  Patient discharged prior to notice being given.   Barb Shear A Tilak Oakley, RN 09/05/2023, 4:46 PM

## 2023-09-05 NOTE — Plan of Care (Signed)
  Problem: Clinical Measurements: Goal: Ability to maintain clinical measurements within normal limits will improve Outcome: Progressing   Problem: Clinical Measurements: Goal: Respiratory complications will improve Outcome: Progressing   Problem: Clinical Measurements: Goal: Cardiovascular complication will be avoided Outcome: Progressing   Problem: Education: Goal: Knowledge of General Education information will improve Description: Including pain rating scale, medication(s)/side effects and non-pharmacologic comfort measures Outcome: Progressing   Problem: Pain Managment: Goal: General experience of comfort will improve and/or be controlled Outcome: Progressing

## 2023-09-06 NOTE — Discharge Summary (Signed)
 Physician Discharge Summary   Patient: Tommy Rowe MRN: [id number] DOB: 05-Oct-1955  Admit date:     09/04/2023  Discharge date: 09/05/2023  Discharge Physician: Brenna Cam   PCP: Suzzane Estes, DO   Recommendations at discharge:   Follow-up with outpatient providers as requested  Discharge Diagnoses: Principal Problem:   Atrial fibrillation with RVR (HCC) Active Problems:   Essential hypertension   Hyperthyroidism  Hospital Course: Assessment and Plan:  68 y.o. male with PMH significant for HTN, HLD, CAD/stent paroxysmal A-fib on Eliquis , anxiety. Patient presented to the ED with complaint of palpitation, lightheadedness.  A-fib/flutter with RVR H/o paroxysmal A-fib Presented with palpitation, lightheadedness, dizziness Noted to be in A-fib/flutter with RVR up to 160s. DCCV was attempted by ED physician with 100J x2 then 120J x1 without success  He seen by cardiology and started on amiodarone  drip - converted to normal sinus rhythm  Was found to have new diagnosis of hyperthyroidism with TSH 0.017, T4 2.21 Continue low-dose metoprolol  for rate control. Eliquis  for stroke risk reduction Outpatient follow-up with EP to discuss possible ablation Requested to see outpatient endocrinology for hyperthyroidism   Demand ischemia  h/o CAD/stent 2023 HLD Troponin mildly elevated likely due to tachycardia.  Hypertension Controlled   BPH On Flomax    Anxiety Lexapro    Hyperthyroidism It is a new diagnosis this admission TSH low at 0.017, free T4 low at 2.21 Thyroid peroxidase antibody elevated and thyroglobulin antibody within normal limit Started on methimazole 5 mg TID which needs to be continued at discharge and have outpatient follow-up with PCP and or endocrinology       Consultants: Cardiology Disposition: Home Diet recommendation:  Discharge Diet Orders (From admission, onward)     Start     Ordered   09/05/23 0000  Diet - low sodium heart healthy         09/05/23 0831           Carb modified diet DISCHARGE MEDICATION: Allergies as of 09/05/2023   No Known Allergies      Medication List     STOP taking these medications    losartan  25 MG tablet Commonly known as: COZAAR    nitroGLYCERIN  0.4 MG SL tablet Commonly known as: NITROSTAT    tadalafil  20 MG tablet Commonly known as: CIALIS        TAKE these medications    Eliquis  5 MG Tabs tablet Generic drug: apixaban  Take 5 mg by mouth 2 (two) times daily.   escitalopram  20 MG tablet Commonly known as: LEXAPRO  Take 20 mg by mouth at bedtime.   methimazole 5 MG tablet Commonly known as: TAPAZOLE Take 1 tablet (5 mg total) by mouth 3 (three) times daily.   metoprolol  tartrate 25 MG tablet Commonly known as: LOPRESSOR  Take 0.5 tablets (12.5 mg total) by mouth 2 (two) times daily.   multivitamin with minerals Tabs tablet Take 1 tablet by mouth daily. Centrum Silver for Men 50+   rosuvastatin  20 MG tablet Commonly known as: CRESTOR  Take 20 mg by mouth at bedtime.   sildenafil  50 MG tablet Commonly known as: Viagra  Take 1 tablet (50 mg total) by mouth daily as needed for erectile dysfunction (take on empty stomach 30 minutes prior to sexual activity).   spironolactone 25 MG tablet Commonly known as: ALDACTONE Take 12.5 mg by mouth daily.   tamsulosin  0.4 MG Caps capsule Commonly known as: FLOMAX  Take 1 capsule (0.4 mg total) by mouth daily.        Follow-up Information  Callwood, Dwayne D, MD. Go in 1 week(s).   Specialties: Cardiology, Internal Medicine Why: APPT ON JUNE 27TH ON FRIDAY AT 9:45 AM. Contact information: [address] [place] [address] [phone]         Suzzane Estes, DO. Schedule an appointment as soon as possible for a visit in 1 week(s).   Specialty: Family Medicine Why: [facility] Discharge F/UP. THIS OFFICE ONLY DOES WALK INS. Contact information: [address] [facility] [place]  [place] [address] [phone]                Discharge Exam: Cleavon Curls Weights   09/04/23 0719  Weight: 93 kg   General exam: Pleasant, middle-aged Caucasian male.  Not in distress currently Skin: No rashes, lesions or ulcers. HEENT: Atraumatic, normocephalic, no obvious bleeding Lungs: Clear to auscultation bilaterally,  CVS: Regular rate and rhythm, no murmur GI/Abd: Soft, nontender, nondistended, bowel sound present,   CNS: Alert, awake more x 3 Psychiatry: Mood appropriate,  Extremities: No pedal edema, no calf tenderness,   Condition at discharge: good  The results of significant diagnostics from this hospitalization (including imaging, microbiology, ancillary and laboratory) are listed below for reference.   Imaging Studies: No results found.  Microbiology: Results for orders placed or performed during the hospital encounter of 09/04/23  MRSA Next Gen by PCR, Nasal     Status: None   Collection Time: 09/04/23  9:43 AM   Specimen: Nasal Mucosa; Nasal Swab  Result Value Ref Range Status   MRSA by PCR Next Gen NOT DETECTED NOT DETECTED Final    Comment: (NOTE) The GeneXpert MRSA Assay (FDA approved for NASAL specimens only), is one component of a comprehensive MRSA colonization surveillance program. It is not intended to diagnose MRSA infection nor to guide or monitor treatment for MRSA infections. Test performance is not FDA approved in patients less than 50 years old. Performed at [facility], [address] [place], [place] [address]     Labs: CBC: Recent Labs  Lab 09/04/23 0727 09/05/23 0402  WBC 4.7 5.5  NEUTROABS 2.9  --   HGB 14.9 13.3  HCT 43.2 38.2*  MCV 92.7 93.4  PLT 137* 118*   Basic Metabolic Panel: Recent Labs  Lab 09/04/23 0727 09/04/23 0927 09/05/23 0402  NA 139  --  139  K 4.2  --  3.6  CL 108  --  112*  CO2 26  --  22  GLUCOSE 125*  --  97  BUN 29*  --  23  CREATININE 0.67  --  0.66  CALCIUM  8.7*  --   8.0*  MG  --  2.2  --    Liver Function Tests: Recent Labs  Lab 09/04/23 0727  AST 30  ALT 29  ALKPHOS 53  BILITOT 1.4*  PROT 6.8  ALBUMIN 3.9   CBG: Recent Labs  Lab 09/04/23 0940  GLUCAP 126*    Discharge time spent: greater than 30 minutes.  Signed: Brenna Cam, MD [facility] 09/06/2023

## 2023-09-08 ENCOUNTER — Other Ambulatory Visit: Payer: Self-pay

## 2023-09-08 ENCOUNTER — Observation Stay
Admission: EM | Admit: 2023-09-08 | Discharge: 2023-09-09 | Disposition: A | Discharge status: Home / Self Care | Attending: Internal Medicine | Admitting: Internal Medicine

## 2023-09-08 ENCOUNTER — Emergency Department

## 2023-09-08 DIAGNOSIS — F419 Anxiety disorder, unspecified: Secondary | ICD-10-CM | POA: Diagnosis not present

## 2023-09-08 DIAGNOSIS — D696 Thrombocytopenia, unspecified: Secondary | ICD-10-CM

## 2023-09-08 DIAGNOSIS — E0590 Thyrotoxicosis, unspecified without thyrotoxic crisis or storm: Secondary | ICD-10-CM | POA: Diagnosis not present

## 2023-09-08 DIAGNOSIS — I5020 Unspecified systolic (congestive) heart failure: Secondary | ICD-10-CM | POA: Diagnosis not present

## 2023-09-08 DIAGNOSIS — I4891 Unspecified atrial fibrillation: Secondary | ICD-10-CM | POA: Diagnosis not present

## 2023-09-08 DIAGNOSIS — I5032 Chronic diastolic (congestive) heart failure: Secondary | ICD-10-CM | POA: Insufficient documentation

## 2023-09-08 DIAGNOSIS — I10 Essential (primary) hypertension: Secondary | ICD-10-CM | POA: Diagnosis not present

## 2023-09-08 DIAGNOSIS — I110 Hypertensive heart disease with heart failure: Secondary | ICD-10-CM | POA: Diagnosis not present

## 2023-09-08 DIAGNOSIS — I480 Paroxysmal atrial fibrillation: Secondary | ICD-10-CM | POA: Diagnosis present

## 2023-09-08 LAB — CBC
HCT: 43.8 % (ref 39.0–52.0)
Hemoglobin: 15.1 g/dL (ref 13.0–17.0)
MCH: 31.7 pg (ref 26.0–34.0)
MCHC: 34.5 g/dL (ref 30.0–36.0)
MCV: 91.8 fL (ref 80.0–100.0)
Platelets: 136 10*3/uL — ABNORMAL LOW (ref 150–400)
RBC: 4.77 MIL/uL (ref 4.22–5.81)
RDW: 12.1 % (ref 11.5–15.5)
WBC: 6.1 10*3/uL (ref 4.0–10.5)
nRBC: 0 % (ref 0.0–0.2)

## 2023-09-08 LAB — BASIC METABOLIC PANEL WITH GFR
Anion gap: 9 (ref 5–15)
BUN: 28 mg/dL — ABNORMAL HIGH (ref 8–23)
CO2: 24 mmol/L (ref 22–32)
Calcium: 9.4 mg/dL (ref 8.9–10.3)
Chloride: 103 mmol/L (ref 98–111)
Creatinine, Ser: 0.91 mg/dL (ref 0.61–1.24)
GFR, Estimated: >60 mL/min (ref >60)
Glucose, Bld: 119 mg/dL — ABNORMAL HIGH (ref 70–99)
Potassium: 4.4 mmol/L (ref 3.5–5.1)
Sodium: 136 mmol/L (ref 135–145)

## 2023-09-08 LAB — TROPONIN I (HIGH SENSITIVITY)
Troponin I (High Sensitivity): 21 ng/L — ABNORMAL HIGH (ref <18)
Troponin I (High Sensitivity): 25 ng/L — ABNORMAL HIGH (ref <18)

## 2023-09-08 LAB — TSH: TSH: 0.016 u[IU]/mL — ABNORMAL LOW (ref 0.350–4.500)

## 2023-09-08 LAB — MAGNESIUM: Magnesium: 1.9 mg/dL (ref 1.7–2.4)

## 2023-09-08 LAB — T4, FREE: Free T4: 2.17 ng/dL — ABNORMAL HIGH (ref 0.61–1.12)

## 2023-09-08 MED ORDER — AMIODARONE HCL IN DEXTROSE 360-4.14 MG/200ML-% IV SOLN: 30.0000 mg/h | INTRAVENOUS | Status: DC

## 2023-09-08 MED ORDER — AMIODARONE HCL IN DEXTROSE 360-4.14 MG/200ML-% IV SOLN
60.0000 mg/h | INTRAVENOUS | Status: DC
  Administered 2023-09-08 (×2): 60 mg/h via INTRAVENOUS
  Filled 2023-09-08 (×2): qty 200

## 2023-09-08 MED ORDER — METOPROLOL TARTRATE 25 MG PO TABS
12.5000 mg | ORAL_TABLET | Freq: Two times a day (BID) | ORAL | Status: DC
  Administered 2023-09-08 – 2023-09-09 (×2): 12.5 mg via ORAL
  Filled 2023-09-08 (×2): qty 1

## 2023-09-08 MED ORDER — TAMSULOSIN HCL 0.4 MG PO CAPS
0.4000 mg | ORAL_CAPSULE | Freq: Every day | ORAL | Status: DC
  Administered 2023-09-09: 0.4 mg via ORAL
  Filled 2023-09-08: qty 1

## 2023-09-08 MED ORDER — SPIRONOLACTONE 12.5 MG HALF TABLET
12.5000 mg | ORAL_TABLET | Freq: Every day | ORAL | Status: DC
  Administered 2023-09-09: 12.5 mg via ORAL
  Filled 2023-09-08: qty 1

## 2023-09-08 MED ORDER — METHIMAZOLE 5 MG PO TABS
5.0000 mg | ORAL_TABLET | Freq: Three times a day (TID) | ORAL | Status: DC
  Administered 2023-09-08 – 2023-09-09 (×2): 5 mg via ORAL
  Filled 2023-09-08 (×3): qty 1

## 2023-09-08 MED ORDER — AMIODARONE LOAD VIA INFUSION
150.0000 mg | Freq: Once | INTRAVENOUS | Status: AC
  Administered 2023-09-08: 150 mg via INTRAVENOUS
  Filled 2023-09-08: qty 83.34

## 2023-09-08 MED ORDER — ONDANSETRON HCL 4 MG/2ML IJ SOLN: 4.0000 mg | Freq: Four times a day (QID) | INTRAMUSCULAR | Status: DC | PRN

## 2023-09-08 MED ORDER — APIXABAN 5 MG PO TABS
5.0000 mg | ORAL_TABLET | Freq: Two times a day (BID) | ORAL | Status: DC
  Administered 2023-09-08 – 2023-09-09 (×2): 5 mg via ORAL
  Filled 2023-09-08 (×2): qty 1

## 2023-09-08 MED ORDER — ACETAMINOPHEN 325 MG PO TABS
650.0000 mg | ORAL_TABLET | ORAL | Status: DC | PRN
Start: 2023-09-08 — End: 2023-09-09

## 2023-09-08 MED ORDER — ROSUVASTATIN CALCIUM 10 MG PO TABS
20.0000 mg | ORAL_TABLET | Freq: Every day | ORAL | Status: DC
  Administered 2023-09-08: 20 mg via ORAL
  Filled 2023-09-08: qty 1
  Filled 2023-09-08: qty 2

## 2023-09-08 MED ORDER — ESCITALOPRAM OXALATE 10 MG PO TABS
20.0000 mg | ORAL_TABLET | Freq: Every day | ORAL | Status: DC
  Administered 2023-09-08: 20 mg via ORAL
  Filled 2023-09-08: qty 2

## 2023-09-08 NOTE — Assessment & Plan Note (Signed)
 Patient has a history of HFmrEF with recovered EF when evaluated in April 2024, at which time estimated to be 50-55%.  He appears euvolemic at this time.  - Continue home regimen

## 2023-09-08 NOTE — Progress Notes (Signed)
   09/08/23 2010  Assess: MEWS Score  Temp 98.5 F (36.9 C)  BP (!) 140/95  MAP (mmHg) 105  Pulse Rate (!) 124  Resp 18  Level of Consciousness Alert  SpO2 95 %  O2 Device Room Air  Assess: MEWS Score  MEWS Temp 0  MEWS Systolic 0  MEWS Pulse 2  MEWS RR 0  MEWS LOC 0  MEWS Score 2  MEWS Score Color Yellow  Assess: if the MEWS score is Yellow or Red  Were vital signs accurate and taken at a resting state? Yes  MEWS guidelines implemented  No, previously yellow, continue vital signs every 4 hours  Notify: Charge Nurse/RN  Name of Charge Nurse/RN Notified Courtney  Assess: SIRS CRITERIA  SIRS Temperature  0  SIRS Respirations  0  SIRS Pulse 1  SIRS WBC 0  SIRS Score Sum  1

## 2023-09-08 NOTE — Assessment & Plan Note (Signed)
 Continue home Lexapro

## 2023-09-08 NOTE — Assessment & Plan Note (Signed)
 Patient was diagnosed with hyperthyroidism at this most recent visit.  His TPO antibodies are positive.  He is a bit out of the demographics expected with Graves' disease; wonder if this may be amiodarone  induced as he was on it for a year and a half, discontinued 6 months ago.  - Continue methimazole  - Recheck thyroid  labs in 4 to 6 weeks - Check free T4 today

## 2023-09-08 NOTE — ED Triage Notes (Signed)
 Pt to ed from home via POV for AFIB. Pt was just seen on 6/4 and was caradioverted and admitted for same. Pt is alert and oriented and in no distress. Pt went to hit golf balls today and felt he is having the same episode.

## 2023-09-08 NOTE — Plan of Care (Signed)

## 2023-09-08 NOTE — Assessment & Plan Note (Signed)
 Patient was recently admitted for atrial fibrillation with RVR, with failed cardioversion requiring amiodarone .  Now presenting with similar dizziness, found to be in RVR once again.  Cardiology recommending amiodarone  given limited options in the setting of left bundle branch block.  - Cardiology consulted; appreciate their recommendations - Telemetry monitoring - Amiodarone  bolus with infusion - Continue home Eliquis  and metoprolol 

## 2023-09-08 NOTE — Plan of Care (Signed)
  Problem: Education: Goal: Knowledge of General Education information will improve Description: Including pain rating scale, medication(s)/side effects and non-pharmacologic comfort measures 09/08/2023 2205 by Franki Isles, RN Outcome: Progressing 09/08/2023 2026 by Franki Isles, RN Outcome: Progressing   Problem: Clinical Measurements: Goal: Respiratory complications will improve 09/08/2023 2205 by Franki Isles, RN Outcome: Progressing 09/08/2023 2026 by Franki Isles, RN Outcome: Progressing   Problem: Activity: Goal: Risk for activity intolerance will decrease 09/08/2023 2205 by Franki Isles, RN Outcome: Progressing 09/08/2023 2026 by Franki Isles, RN Outcome: Progressing   Problem: Coping: Goal: Level of anxiety will decrease 09/08/2023 2205 by Franki Isles, RN Outcome: Progressing 09/08/2023 2026 by Franki Isles, RN Outcome: Progressing

## 2023-09-08 NOTE — ED Notes (Signed)
 Pt laying in bed watching TV. No comfort measures requested at this time.

## 2023-09-08 NOTE — ED Provider Notes (Signed)
 [facility] Provider Note    Event Date/Time   First MD Initiated Contact with Patient 09/08/23 1606     (approximate)   History   Tachycardia   HPI  Tommy Rowe is a 68 y.o. male with history of atrial fibrillation who comes in with concerns for tachycardia.  I reviewed a note where patient was admitted from 6/4 until 6/5.  Patient was attempted to be cardioverted x 3 times and then seen by cardiology started on amnio drip and converted to normal sinus.  Patient found to be newly diagnosed with hyperthyroidism.  Patient continued on low-dose metoprolol , Eliquis .  They are going to work with EP to discuss possible ablation.   Patient went to go have tall pulse today and he felt a similar episode of increased heart rate.  Patient has had 2 prior cardioversions.  His first cardioversion was June 16, 2022 and he tolerated that well.  However he came in he came back on 6/4 cardioversion was not successful Patient was on metoprolol  12.5 twice daily and his heart rates were in the 60s  Patient reports that today he went to go play golf and he felt sudden onset of palpitations, lightheadedness.  He reports that this was similar to when he has had A-fib last time therefore he presented to the emergency room.  He reports that really no significant symptoms at this time.   Physical Exam   Triage Vital Signs: ED Triage Vitals [09/08/23 1604]  Encounter Vitals Group     BP      Systolic BP Percentile      Diastolic BP Percentile      Pulse      Resp      Temp      Temp src      SpO2      Weight      Height 6' (1.829 m)     Head Circumference      Peak Flow      Pain Score 0     Pain Loc      Pain Education      Exclude from Growth Chart     Most recent vital signs: Vitals:   09/08/23 1614 09/08/23 1700  BP: (!) 120/109 (!) 116/100  Pulse:  (!) 115  Resp:  13  Temp: 98 F (36.7 C)   SpO2:  94%     General: Awake, no distress.  CV:  Good  peripheral perfusion.  Resp:  Normal effort.  Abd:  No distention.  Other:   CN 2-12 intact.  Equal strength in arms and legs.  Finger-to-nose intact bilaterally  ED Results / Procedures / Treatments   Labs (all labs ordered are listed, but only abnormal results are displayed) Labs Reviewed  BASIC METABOLIC PANEL WITH GFR  CBC  TROPONIN I (HIGH SENSITIVITY)     EKG  My interpretation of EKG:  Atrial fibrillation with a rate of 156 without any ST elevation or T wave inversions, left bundle branch block  RADIOLOGY I have reviewed the xray personally and interpreted no evidence of any pneumonia   PROCEDURES:  Critical Care performed: Yes, see critical care procedure note(s)  .1-3 Lead EKG Interpretation  Performed by: Lubertha Rush, MD Authorized by: Lubertha Rush, MD     Interpretation: abnormal     ECG rate:  150   ECG rate assessment: tachycardic     Rhythm: atrial fibrillation     Ectopy: none  Conduction: normal   .Critical Care  Performed by: Lubertha Rush, MD Authorized by: Lubertha Rush, MD   Critical care provider statement:    Critical care time (minutes):  30   Critical care was necessary to treat or prevent imminent or life-threatening deterioration of the following conditions:  Cardiac failure   Critical care was time spent personally by me on the following activities:  Development of treatment plan with patient or surrogate, discussions with consultants, evaluation of patient's response to treatment, examination of patient, ordering and review of laboratory studies, ordering and review of radiographic studies, ordering and performing treatments and interventions, pulse oximetry, re-evaluation of patient's condition and review of old charts    MEDICATIONS ORDERED IN ED: Medications  amiodarone  (NEXTERONE ) 1.8 mg/mL load via infusion 150 mg (150 mg Intravenous Bolus from Bag 09/08/23 1639)    Followed by  amiodarone  (NEXTERONE  PREMIX) 360-4.14  MG/200ML-% (1.8 mg/mL) IV infusion (60 mg/hr Intravenous New Bag/Given 09/08/23 1640)    Followed by  amiodarone  (NEXTERONE  PREMIX) 360-4.14 MG/200ML-% (1.8 mg/mL) IV infusion (has no administration in time range)     IMPRESSION / MDM / ASSESSMENT AND PLAN / ED COURSE  I reviewed the triage vital signs and the nursing notes.   Patient's presentation is most consistent with acute presentation with potential threat to life or bodily function.   Patient comes in with concerns for recurrent A-fib with RVR.  He does report being compliant with his Eliquis .  We discussed cardioversion but I am concerned that if we cardiovert him that he would just go right back into A-fib given this is just what happened.  Discussed the case with Dr. Bob Burn who recommended to use amiodarone  in the short-term given patient does not have any other options given his left bundle branch not a candidate for flecainide given his bradycardia when he is not in A-fib difficulties digoxin .  Open it is not a good thing for long-term for tinnitus he did recommend we on amnio and then hopefully they can have EP see him tomorrow come up with a plan.  Magnesium was at 1.9.  BMP reassuring.  CBC reassuring troponins slightly elevated most likely demand.  T4 levels are similar to 4 days ago.    6:10 PM on repeat examination patient continues to deny any dizziness.  He is got no evidence of posterior stroke hiss finger-nose is intact.   The patient is on the cardiac monitor to evaluate for evidence of arrhythmia and/or significant heart rate changes.      FINAL CLINICAL IMPRESSION(S) / ED DIAGNOSES   Final diagnoses:  Atrial fibrillation with rapid ventricular response (HCC)     Rx / DC Orders   ED Discharge Orders     None        Note:  This document was prepared using Dragon voice recognition software and may include unintentional dictation errors.   Lubertha Rush, MD 09/08/23 [phone]

## 2023-09-08 NOTE — H&P (Signed)
 History and Physical    Patient: Tommy Rowe NFA:[id number] DOB: 08-22-1955 DOA: 09/08/2023 DOS: the patient was seen and examined on 09/08/2023 PCP: Suzzane Estes, DO  Patient coming from: Home  Chief Complaint:  Chief Complaint  Patient presents with   Tachycardia   HPI: Tommy Rowe is a 68 y.o. male with medical history significant of Paroxysmal atrial fibrillation on Eliquis  s/p recent cardioversion (6//2025), new onset hyperthyroidism, chronic combined systolic and diastolic heart failure, CAD s/p PCI with DES to LAD (June 2023), hypertension, hyperlipidemia, left bundle branch block, who presents to the ED due to dizziness.  Tommy Rowe states that he has dealt with atrial fibrillation with RVR a few times down recognizes the symptoms with the start.  He states that he usually has dizziness after bending over.  He was golfing earlier today when he began to experience the dizziness and checked his heart rate, noting it to be very high.  Due to this, he returned to the ER.  He denies any chest pain, shortness of breath, palpitations, lower extremity swelling.  He states that otherwise, he feels well.  Per chart review, patient was recently admitted from 09/04/2023 till 09/05/2023 for atrial fibrillation with RVR requiring cardioversion in the ED.  Unfortunately cardioversion was unsuccessful and patient was loaded with amiodarone .  Patient subsequently converted to sinus rhythm.  Amiodarone  discontinued due to newly discovered hyperthyroidism.  Patient was discharged on methimazole  3 times daily.  ED course: On arrival to the ED, patient was normotensive at 116/100 with heart rate of 141.  Initial workup notable for platelets 136, with unremarkable BMP.  Troponin 21.  TSH 0.016 with free T42.17.  Chest x-ray with no active disease.  Cardiology was consulted and recommending reloading amiodarone .  TRH contacted for admission.   Review of Systems: As mentioned in the history of present  illness. All other systems reviewed and are negative.  Past Medical History:  Diagnosis Date   A-fib (HCC)    Anxiety    HLD (hyperlipidemia)    HTN (hypertension)    Past Surgical History:  Procedure Laterality Date   CORONARY STENT INTERVENTION N/A 09/18/2021   Procedure: CORONARY STENT INTERVENTION;  Surgeon: Antonette Batters, MD;  Location: [facility] IN[facility]IVE CV [facility];  Service: Cardiovascular;  Laterality: N/A;   FACIAL FRACTURE SURGERY  1980   LEFT HEART CATH AND CORONARY ANGIOGRAPHY Left 04/11/2021   Procedure: LEFT HEART CATH AND CORONARY ANGIOGRAPHY;  Surgeon: Cherrie Cornwall, MD;  Location: [facility];  Service: Cardiovascular;  Laterality: Left;   LEFT HEART CATH AND CORONARY ANGIOGRAPHY N/A 09/18/2021   Procedure: LEFT HEART CATH AND CORONARY ANGIOGRAPHY;  Surgeon: Antonette Batters, MD;  Location: [facility] CV [facility];  Service: Cardiovascular;  Laterality: N/A;   LUMBAR LAMINECTOMY/DECOMPRESSION MICRODISCECTOMY N/A 08/26/2019   Procedure: RIGHT L4-5 MICRODISCECTOMY;  Surgeon: Jodeen Munch, MD;  Location: [facility] ORS;  Service: Neurosurgery;  Laterality: N/A;   Social History:  reports that he quit smoking about 35 years ago. His smoking use included cigarettes. He has been exposed to tobacco smoke. He has never used smokeless tobacco. He reports that he does not drink alcohol and does not use drugs.  No Known Allergies  Family History  Problem Relation Age of Onset   COPD Mother    Heart attack Father    Valvular heart disease Sister     Prior to Admission medications   Medication Sig Start Date End Date Taking? Authorizing Provider  ELIQUIS  5  MG TABS tablet Take 5 mg by mouth 2 (two) times daily. 08/17/21   [provider]  escitalopram  (LEXAPRO ) 20 MG tablet Take 20 mg by mouth at bedtime. 11/05/16   [provider]  methimazole  (TAPAZOLE ) 5 MG tablet Take 1 tablet (5 mg total) by mouth 3 (three) times daily. 09/05/23 10/05/23  Brenna Cam, MD   metoprolol  tartrate (LOPRESSOR ) 25 MG tablet Take 0.5 tablets (12.5 mg total) by mouth 2 (two) times daily. 09/05/23 10/05/23  Brenna Cam, MD  Multiple Vitamin (MULTIVITAMIN WITH MINERALS) TABS tablet Take 1 tablet by mouth daily. Centrum Silver for Men 50+    [provider]  rosuvastatin  (CRESTOR ) 20 MG tablet Take 20 mg by mouth at bedtime.    [provider]  sildenafil  (VIAGRA ) 50 MG tablet Take 1 tablet (50 mg total) by mouth daily as needed for erectile dysfunction (take on empty stomach 30 minutes prior to sexual activity). 08/29/23   Lawerence Pressman, MD  spironolactone (ALDACTONE) 25 MG tablet Take 12.5 mg by mouth daily. 07/16/23   [provider]  tamsulosin  (FLOMAX ) 0.4 MG CAPS capsule Take 1 capsule (0.4 mg total) by mouth daily. 08/29/23   Lawerence Pressman, MD    Physical Exam: Vitals:   09/08/23 1604 09/08/23 1611 09/08/23 1614 09/08/23 1700  BP:   (!) 120/109 (!) 116/100  Pulse:  (!) 160  (!) 115  Resp:  16  13  Temp:   98 F (36.7 C)   TempSrc:   Oral   SpO2:  94%  94%  Height: 6' (1.829 m)      Physical Exam Vitals and nursing note reviewed.  Constitutional:      General: He is not in acute distress.    Appearance: He is normal weight. He is not toxic-appearing.  HENT:     Head: Normocephalic and atraumatic.     Mouth/Throat:     Mouth: Mucous membranes are moist.     Pharynx: Oropharynx is clear.  Eyes:     Conjunctiva/sclera: Conjunctivae normal.     Pupils: Pupils are equal, round, and reactive to light.  Cardiovascular:     Rate and Rhythm: Tachycardia present. Rhythm irregular.     Heart sounds: No murmur heard. Pulmonary:     Effort: Pulmonary effort is normal. No respiratory distress.     Breath sounds: Normal breath sounds. No wheezing, rhonchi or rales.  Abdominal:     General: Bowel sounds are normal. There is no distension.     Palpations: Abdomen is soft.     Tenderness: There is no abdominal tenderness.   Musculoskeletal:     Right lower leg: No edema.     Left lower leg: No edema.  Skin:    General: Skin is warm and dry.  Neurological:     General: No focal deficit present.     Mental Status: He is alert and oriented to person, place, and time. Mental status is at baseline.  Psychiatric:        Mood and Affect: Mood normal.        Behavior: Behavior normal.    Data Reviewed: CBC with WBC of 6.1, hemoglobin of 15.1, platelets 136 BMP with temp of 136, potassium 4.4, bicarb 24, PN 28, creat 0.91, GFR above 60 Magnesium 1.9 Troponin 21  EKG is only reviewed.  Irregular wide-complex tachycardia consistent with atrial fibrillation with RVR with left bundle branch block.  Compared to prior EKGs, left bundle branch block is  chronic.  DG Chest Port 1 View Result Date: 09/08/2023 CLINICAL DATA:  Chest pain and tachycardia. EXAM: PORTABLE CHEST 1 VIEW COMPARISON:  Chest x-ray 09/17/2021 FINDINGS: External pacer paddles are noted. The heart is normal in size. The mediastinal and hilar contours are normal. The lungs are clear. No infiltrates, effusions or edema. No pneumothorax. No pulmonary lesions. The bony thorax is intact. IMPRESSION: No acute cardiopulmonary findings. Electronically Signed   By: Marrian Siva M.D.   On: 09/08/2023 16:28   Results are pending, will review when available.  Assessment and Plan:  * Atrial fibrillation with RVR (HCC) Patient was recently admitted for atrial fibrillation with RVR, with failed cardioversion requiring amiodarone .  Now presenting with similar dizziness, found to be in RVR once again.  Cardiology recommending amiodarone  given limited options in the setting of left bundle branch block.  - Cardiology consulted; appreciate their recommendations - Telemetry monitoring - Amiodarone  bolus with infusion - Continue home Eliquis  and metoprolol   Hyperthyroidism Patient was diagnosed with hyperthyroidism at this most recent visit.  His TPO antibodies are  positive.  He is a bit out of the demographics expected with Graves' disease; wonder if this may be amiodarone  induced as he was on it for a year and a half, discontinued 6 months ago.  - Continue methimazole  - Recheck thyroid  labs in 4 to 6 weeks - Check free T4 today  HFrEF (heart failure with moderately reduced ejection fraction) (HCC) Patient has a history of HFmrEF with recovered EF when evaluated in April 2024, at which time estimated to be 50-55%.  He appears euvolemic at this time.  - Continue home regimen  Essential hypertension - Continue home regimen  Thrombocytopenia (HCC) Chronic, mild intermittent thrombocytopenia; stable at this time.  Anxiety - Continue home Lexapro   Advance Care Planning:   Code Status: Full Code   Consults: Cardiology  Family Communication: No family at bedside  Severity of Illness: The appropriate patient status for this patient is OBSERVATION. Observation status is judged to be reasonable and necessary in order to provide the required intensity of service to ensure the patient's safety. The patient's presenting symptoms, physical exam findings, and initial radiographic and laboratory data in the context of their medical condition is felt to place them at decreased risk for further clinical deterioration. Furthermore, it is anticipated that the patient will be medically stable for discharge from the hospital within 2 midnights of admission.   Author: Avi Body, MD 09/08/2023 6:43 PM  For on call review [email or web address].

## 2023-09-08 NOTE — Assessment & Plan Note (Signed)
 -  Continue home regimen

## 2023-09-08 NOTE — Assessment & Plan Note (Signed)
 Chronic, mild intermittent thrombocytopenia; stable at this time.

## 2023-09-09 DIAGNOSIS — I10 Essential (primary) hypertension: Secondary | ICD-10-CM | POA: Diagnosis not present

## 2023-09-09 DIAGNOSIS — E0590 Thyrotoxicosis, unspecified without thyrotoxic crisis or storm: Secondary | ICD-10-CM | POA: Diagnosis not present

## 2023-09-09 DIAGNOSIS — I4891 Unspecified atrial fibrillation: Secondary | ICD-10-CM

## 2023-09-09 DIAGNOSIS — I5020 Unspecified systolic (congestive) heart failure: Secondary | ICD-10-CM | POA: Diagnosis not present

## 2023-09-09 DIAGNOSIS — R001 Bradycardia, unspecified: Secondary | ICD-10-CM | POA: Diagnosis not present

## 2023-09-09 LAB — CBC WITH DIFFERENTIAL/PLATELET
Abs Immature Granulocytes: 0.01 10*3/uL (ref 0.00–0.07)
Basophils Absolute: 0 10*3/uL (ref 0.0–0.1)
Basophils Relative: 0 %
Eosinophils Absolute: 0.1 10*3/uL (ref 0.0–0.5)
Eosinophils Relative: 3 %
HCT: 40.3 % (ref 39.0–52.0)
Hemoglobin: 14 g/dL (ref 13.0–17.0)
Immature Granulocytes: 0 %
Lymphocytes Relative: 27 %
Lymphs Abs: 1.4 10*3/uL (ref 0.7–4.0)
MCH: 31.9 pg (ref 26.0–34.0)
MCHC: 34.7 g/dL (ref 30.0–36.0)
MCV: 91.8 fL (ref 80.0–100.0)
Monocytes Absolute: 0.6 10*3/uL (ref 0.1–1.0)
Monocytes Relative: 11 %
Neutro Abs: 3 10*3/uL (ref 1.7–7.7)
Neutrophils Relative %: 59 %
Platelets: 137 10*3/uL — ABNORMAL LOW (ref 150–400)
RBC: 4.39 MIL/uL (ref 4.22–5.81)
RDW: 12.1 % (ref 11.5–15.5)
WBC: 5.1 10*3/uL (ref 4.0–10.5)
nRBC: 0 % (ref 0.0–0.2)

## 2023-09-09 LAB — BASIC METABOLIC PANEL WITH GFR
Anion gap: 8 (ref 5–15)
BUN: 25 mg/dL — ABNORMAL HIGH (ref 8–23)
CO2: 25 mmol/L (ref 22–32)
Calcium: 8.7 mg/dL — ABNORMAL LOW (ref 8.9–10.3)
Chloride: 106 mmol/L (ref 98–111)
Creatinine, Ser: 0.78 mg/dL (ref 0.61–1.24)
GFR, Estimated: >60 mL/min (ref >60)
Glucose, Bld: 97 mg/dL (ref 70–99)
Potassium: 4.1 mmol/L (ref 3.5–5.1)
Sodium: 139 mmol/L (ref 135–145)

## 2023-09-09 LAB — MAGNESIUM: Magnesium: 2.1 mg/dL (ref 1.7–2.4)

## 2023-09-09 NOTE — TOC CM/SW Note (Signed)
 Transition of Care [facility]) - Inpatient Brief Assessment   Patient Details  Name: Tommy Rowe MRN: [id number] Date of Birth: 04/13/1955  [facility] CM/SW Contact:    Odilia Bennett, LCSW Phone Number: 09/09/2023, 12:17 PM   Clinical Narrative: Patient has orders to discharge home today. Chart reviewed. No TOC needs identified. CSW signing off.  Transition of Care Asessment: Insurance and Status: Insurance coverage has been reviewed Patient has primary care physician: Yes Home environment has been reviewed: Single family home Prior level of function:: Not documented Prior/Current Home Services: No current home services Social Drivers of Health Review: SDOH reviewed no interventions necessary Readmission risk has been reviewed: Yes Transition of care needs: no transition of care needs at this time

## 2023-09-09 NOTE — Consult Note (Signed)
 [facility] CLINIC CARDIOLOGY CONSULT NOTE       Patient ID: Tommy Rowe MRN: [id number] DOB/AGE: January 26, 1956 68 y.o.  Admit date: 09/08/2023 Referring Physician Dr. Avi Body Primary Physician Suzzane Estes, DO Primary Cardiologist Dr. Beau Bound Reason for Consultation AF RVR  HPI: Tommy Rowe is a 68 y.o. male  with a past medical history of paroxsymal atrial fibrillation (on Eliquis ), chronic combined systolic and diastolic heart failure, coronary artery disease s/p stent to LAD (08/2021), Hx NSTEMI, hypertension, hyperlipidemia, hx bradycardia, hx LBBB. Patient recent hospitalization on 06/04 for atrial fibrillation RVR rates in 130s - 160s with soft blood pressures. Patient had unsuccessful synchronized cardioversion and converted with amio drip. During this recent admission found to have hyperthyroidism and amio was discontinued. Patient  presented to the ED on 09/08/2023 for palpitations and lightheadedness with ED EKG revealing atrial fibrillation RVR in 150s. Cardiology was consulted for further evaluation.   Patient presented to the ED with lightheadedness after playing golf and found to be in AF RVR in ED. Work up in the ED notable for Na 136, K 4.4, Mg 1.2, Cr 0.91, hemoglobin 15.1, platelets 136.  TSH 0.016, T4 elevated 2.17. CXR with no acute cardiopulmonary disease. EKG with atrial fibrillation, LBBB, rate 156 bpm.  Patient started on IV amiodarone  infusion.   At the time of my evaluation this AM, patient was resting comfortably in hospital bed with family at bedside.  We discussed patient's symptoms in further detail.  Patient states he felt lightheaded after playing 9 holes [place] the golf [place] and patient states he knew he went back in A-fib because he felt lightheaded. Patient states he always feels the same way right before he goes into AF.  Patient denies any chest pain, palpitations, shortness of breath or swelling. Patient states he never feels palpitations when in AF,  he always feels lightheaded. Patient converted into SR on 06/09 at 12:30 AM and has remains in SR. Patient states he feels great and denies any recurrence of lightheadedness.    Review of systems complete and found to be negative unless listed above    Past Medical History:  Diagnosis Date   A-fib (HCC)    Anxiety    HLD (hyperlipidemia)    HTN (hypertension)     Past Surgical History:  Procedure Laterality Date   CORONARY STENT INTERVENTION N/A 09/18/2021   Procedure: CORONARY STENT INTERVENTION;  Surgeon: Antonette Batters, MD;  Location: [facility] INVASIVE CV LAB;  Service: Cardiovascular;  Laterality: N/A;   FACIAL FRACTURE SURGERY  1980   LEFT HEART CATH AND CORONARY ANGIOGRAPHY Left 04/11/2021   Procedure: LEFT HEART CATH AND CORONARY ANGIOGRAPHY;  Surgeon: Cherrie Cornwall, MD;  Location: [facility];  Service: Cardiovascular;  Laterality: Left;   LEFT HEART CATH AND CORONARY ANGIOGRAPHY N/A 09/18/2021   Procedure: LEFT HEART CATH AND CORONARY ANGIOGRAPHY;  Surgeon: Antonette Batters, MD;  Location: [facility] INV[facility]IVE CV LAB;  Service: Cardiovascular;  Laterality: N/A;   LUMBAR LAMINECTOMY/DECOMPRESSION MICRODISCECTOMY N/A 08/26/2019   Procedure: RIGHT L4-5 MICRODISCECTOMY;  Surgeon: Jodeen Munch, MD;  Location: [facility] ORS;  Service: Neurosurgery;  Laterality: N/A;    Medications Prior to Admission  Medication Sig Dispense Refill Last Dose/Taking   ELIQUIS  5 MG TABS tablet Take 5 mg by mouth 2 (two) times daily.      escitalopram  (LEXAPRO ) 20 MG tablet Take 20 mg by mouth at bedtime.  1    methimazole  (TAPAZOLE ) 5 MG tablet Take 1 tablet (  5 mg total) by mouth 3 (three) times daily. 90 tablet 0    metoprolol  tartrate (LOPRESSOR ) 25 MG tablet Take 0.5 tablets (12.5 mg total) by mouth 2 (two) times daily. 30 tablet 0    Multiple Vitamin (MULTIVITAMIN WITH MINERALS) TABS tablet Take 1 tablet by mouth daily. Centrum Silver for Men 50+      rosuvastatin  (CRESTOR ) 20 MG tablet Take 20  mg by mouth at bedtime.      sildenafil  (VIAGRA ) 50 MG tablet Take 1 tablet (50 mg total) by mouth daily as needed for erectile dysfunction (take on empty stomach 30 minutes prior to sexual activity). 30 tablet 6    spironolactone (ALDACTONE) 25 MG tablet Take 12.5 mg by mouth daily.      tamsulosin  (FLOMAX ) 0.4 MG CAPS capsule Take 1 capsule (0.4 mg total) by mouth daily. 30 capsule 11    Social History   Socioeconomic History   Marital status: Married    Spouse name: Not on file   Number of children: Not on file   Years of education: Not on file   Highest education level: Not on file  Occupational History   Not on file  Tobacco Use   Smoking status: Former    Current packs/day: 0.00    Types: Cigarettes    Quit date: 04/02/1988    Years since quitting: 35.4    Passive exposure: Past   Smokeless tobacco: Never  Vaping Use   Vaping status: Not on file  Substance and Sexual Activity   Alcohol use: No   Drug use: Never   Sexual activity: Not on file  Other Topics Concern   Not on file  Social History Narrative   Not on file   Social Drivers of Health   Financial Resource Strain: Not on file  Food Insecurity: No Food Insecurity (09/04/2023)   Hunger Vital Sign    Worried About Running Out of Food in the Last Year: Never true    Ran Out of Food in the Last Year: Never true  Transportation Needs: No Transportation Needs (09/04/2023)   PRAPARE - Administrator, Civil Service (Medical): No    Lack of Transportation (Non-Medical): No  Physical Activity: Not on file  Stress: Not on file  Social Connections: Moderately Integrated (09/04/2023)   Social Connection and Isolation Panel [NHANES]    Frequency of Communication with Friends and Family: More than three times a week    Frequency of Social Gatherings with Friends and Family: More than three times a week    Attends Religious Services: More than 4 times per year    Active Member of [employer] or Organizations: No     Attends Banker Meetings: Never    Marital Status: Married  Catering manager Violence: Not At Risk (09/04/2023)   Humiliation, Afraid, Rape, and Kick questionnaire    Fear of Current or Ex-Partner: No    Emotionally Abused: No    Physically Abused: No    Sexually Abused: No    Family History  Problem Relation Age of Onset   COPD Mother    Heart attack Father    Valvular heart disease Sister      Vitals:   09/09/23 0310 09/09/23 0405 09/09/23 0431 09/09/23 0441  BP:   117/70   Pulse: (!) 51 (!) 52 (!) 55 (!) 59  Resp: 15 14 14 19   Temp:   97.8 F (36.6 C)   TempSrc:   Oral   SpO2:  95% 95% 97% 99%  Height:        PHYSICAL EXAM General: Well-appearing elderly male, well nourished, in no acute distress. HEENT: Normocephalic and atraumatic. Neck: No JVD.   Lungs: Normal respiratory effort on room air. Clear bilaterally to auscultation. No wheezes, crackles, rhonchi.  Heart: HRRR. Normal S1 and S2 without gallops or murmurs.  Abdomen: Non-distended appearing.  Msk: Normal strength and tone for age. Extremities: Warm and well perfused. No clubbing, cyanosis. No edema.  Neuro: Alert and oriented X 3. Psych: Answers questions appropriately.   Labs: Basic Metabolic Panel: Recent Labs    09/08/23 1610 09/09/23 0359  NA 136 139  K 4.4 4.1  CL 103 106  CO2 24 25  GLUCOSE 119* 97  BUN 28* 25*  CREATININE 0.91 0.78  CALCIUM  9.4 8.7*  MG 1.9 2.1   Liver Function Tests: No results for input(s): "AST", "ALT", "ALKPHOS", "BILITOT", "PROT", "ALBUMIN" in the last 72 hours. No results for input(s): "LIPASE", "AMYLASE" in the last 72 hours. CBC: Recent Labs    09/08/23 1610 09/09/23 0359  WBC 6.1 5.1  NEUTROABS  --  3.0  HGB 15.1 14.0  HCT 43.8 40.3  MCV 91.8 91.8  PLT 136* 137*   Cardiac Enzymes: Recent Labs    09/08/23 1610 09/08/23 1832  TROPONINIHS 21* 25*   BNP: No results for input(s): "BNP" in the last 72 hours. D-Dimer: No results for  input(s): "DDIMER" in the last 72 hours. Hemoglobin A1C: No results for input(s): "HGBA1C" in the last 72 hours. Fasting Lipid Panel: No results for input(s): "CHOL", "HDL", "LDLCALC", "TRIG", "CHOLHDL", "LDLDIRECT" in the last 72 hours. Thyroid  Function Tests: Recent Labs    09/08/23 1610  TSH 0.016*   Anemia Panel: No results for input(s): "VITAMINB12", "FOLATE", "FERRITIN", "TIBC", "IRON", "RETICCTPCT" in the last 72 hours.   Radiology: [facility] Chest Port 1 View Result Date: 09/08/2023 CLINICAL DATA:  Chest pain and tachycardia. EXAM: PORTABLE CHEST 1 VIEW COMPARISON:  Chest x-ray 09/17/2021 FINDINGS: External pacer paddles are noted. The heart is normal in size. The mediastinal and hilar contours are normal. The lungs are clear. No infiltrates, effusions or edema. No pneumothorax. No pulmonary lesions. The bony thorax is intact. IMPRESSION: No acute cardiopulmonary findings. Electronically Signed   By: Marrian Siva M.D.   On: 09/08/2023 16:28    ECHO scheduled to do outpatient.  TELEMETRY reviewed by me 09/09/2023: Sinus rhythm, heart rate 50s.  EKG reviewed by me: atrial fibrillation, LBBB, rate 156 bpm  Data reviewed by me 09/09/2023: last 24h vitals tele labs imaging I/O ED provider note, admission H&P.  Principal Problem:   Atrial fibrillation with RVR (HCC) Active Problems:   Anxiety   Essential hypertension   Hyperthyroidism   Thrombocytopenia (HCC)   HFrEF (heart failure with moderately reduced ejection fraction) (HCC)    ASSESSMENT AND PLAN:  Tommy Rowe is a 68 y.o. male  with a past medical history of paroxsymal atrial fibrillation (on Eliquis ), chronic combined systolic and diastolic heart failure, coronary artery disease s/p stent to LAD (08/2021), Hx NSTEMI, hypertension, hyperlipidemia, hx bradycardia, hx LBBB. Patient recent hospitalization on 06/04 for atrial fibrillation RVR rates in 130s - 160s with soft blood pressures. Patient had unsuccessful synchronized  cardioversion and converted with amio drip. During this recent admission found to have hyperthyroidism and amio was discontinued. Patient  presented to the ED on 09/08/2023 for palpitations and lightheadedness with ED EKG revealing atrial fibrillation RVR in 150s. Cardiology was consulted  for further evaluation.   # Atrial fibrillation RVR # Paroxsymal atrial fibrillation # Hx LBBB # Hyperthyroidism Patient presents with lightheadedness and palpitations with AF with RVR rates in 150s. Patient has recent hx of unsuccessful DCCVs from prior hospitalization and patient previously converted with IV amio drip. On 06/09 at 12:30 AM patient chemically cardioverted with IV amio drip. Per tele sinus rhythm, rate 50s.  -EKG ordered. -Monitor and replenish electrolytes for a goal K >4, Mag >2  -d/c amio gtt. -Hold PO maintenance amio due to newly diagnosed hyperthyroidism  -Continue low dose metoprolol  tartrate 12.5 mg BID for rate control. Up titrate as BP allows. -Continue Eliquis  5 mg twice daily for stroke risk reduction.  -Plan for outpatient follow-up with EP to discuss possible ablation. Will schedule appt with Dr. Andy Bannister on 06/13. -Methimazole  5 mg TID for hyperthyroidism. Recommend referral to endocrinology to further evaluate hyperthyroidism.    # Chronic HFpEF # Coronary artery disease s/p stent to LAD (08/2021) # Hypertension # Hyperlipidemia Prior echo on 07/2022 with pEF (50-55%), normal LV and RV systolic function, mild LVH, mild-mod MR, mild TR, mild LAE.  -Echo scheduled to complete outpatient on 06/11. -Continue spironolactone 12.5 mg daily. Uptitarte as renal function allows. -Hold off on resuming home losartan  for now to give room for uptitration of beta blocker. -Metoprolol  as above. -Continue rosuvastatin  20 mg daily.   Ok for discharge today from a cardiac perspective. Outpatient appointment scheduled for 06/19 with Dr. Beau Bound. Will call patient to schedule EP outpatient  appointment with Dr. Andy Bannister on 06/13.  This patient's plan of care was discussed and created with Dr. Custovic and she is in agreement.  Signed: Creighton Doffing, PA-C  09/09/2023, 6:38 AM [facility]

## 2023-09-09 NOTE — Hospital Course (Addendum)
 Taken from H&P.  Tommy Rowe is a 68 y.o. male with medical history significant of Paroxysmal atrial fibrillation on Eliquis  s/p recent cardioversion (6//2025), new onset hyperthyroidism, chronic combined systolic and diastolic heart failure, CAD s/p PCI with DES to LAD (June 2023), hypertension, hyperlipidemia, left bundle branch block, who presents to the ED due to dizziness.   Per chart review, patient was recently admitted from 09/04/2023 till 09/05/2023 for atrial fibrillation with RVR requiring cardioversion in the ED. Unfortunately cardioversion was unsuccessful and patient was loaded with amiodarone . Patient subsequently converted to sinus rhythm. Amiodarone  discontinued due to newly discovered hyperthyroidism. Patient was discharged on methimazole  3 times daily.   On presentation patient was in A-fib with heart rate of 141, otherwise stable vital, labs with platelet of 136, troponin 21,TSH 0.016 with free T42.17. Chest x-ray with no active disease. Cardiology was consulted and recommending reloading amiodarone .   6/9: Patient overnight converted to sinus rhythm and did develop some bradycardia.  Amiodarone  was discontinued.  Labs stable.  Cardiology cleared him for discharge.  Patient was asymptomatic with heart rate in low 60s.  Cardiology will arrange EP evaluation as outpatient. Amiodarone  was not continued due to recent diagnosis of hyperthyroidism.  A referral to see endocrinology was also provided.  Patient continue on current medications and need to have a close follow-up with his providers for further assistance.

## 2023-09-09 NOTE — Progress Notes (Signed)
       CROSS COVER NOTE  NAME: Tommy Rowe MRN: [id number] DOB : 1955/06/20    Concern as stated by nurse / staff   Converted to sinus bradycardia at 50 at 12:30 AM, BP 90s over 50s     Pertinent findings on chart review: H&P from 6/8 reviewed  Patient Assessment    09/09/2023   12:52 AM 09/09/2023   12:47 AM 09/09/2023   12:42 AM  Vitals with BMI  Systolic 105    Diastolic 73    Pulse 56 56 58     Assessment and  Interventions   Assessment:  Rapid A-fib with successful chemical cardioversion, now hypotensive and in sinus bradycardia  Plan: Hold amiodarone  infusion--restart if goes back into rapid A-fib Hold off on starting oral maintenance given prior discontinuation due to hypothyroidism diagnosis with positive TPO antibodies X

## 2023-09-09 NOTE — Discharge Summary (Signed)
 Physician Discharge Summary   Patient: Tommy Rowe MRN: [id number] DOB: 11/21/1955  Admit date:     09/08/2023  Discharge date: 09/09/23  Discharge Physician: Luna Salinas   PCP: Suzzane Estes, DO   Recommendations at discharge:  Please obtain CBC and BMP on follow-up Please follow-up thyroid  panel Follow-up with primary care provider Follow-up with endocrinology Follow-up with cardiology  Discharge Diagnoses: Principal Problem:   Atrial fibrillation with RVR (HCC) Active Problems:   Hyperthyroidism   HFrEF (heart failure with moderately reduced ejection fraction) (HCC)   Essential hypertension   Thrombocytopenia (HCC)   Anxiety  Hospital Course: Taken from H&P.  Tommy Rowe is a 68 y.o. male with medical history significant of Paroxysmal atrial fibrillation on Eliquis  s/p recent cardioversion (6//2025), new onset hyperthyroidism, chronic combined systolic and diastolic heart failure, CAD s/p PCI with DES to LAD (June 2023), hypertension, hyperlipidemia, left bundle branch block, who presents to the ED due to dizziness.   Per chart review, patient was recently admitted from 09/04/2023 till 09/05/2023 for atrial fibrillation with RVR requiring cardioversion in the ED. Unfortunately cardioversion was unsuccessful and patient was loaded with amiodarone . Patient subsequently converted to sinus rhythm. Amiodarone  discontinued due to newly discovered hyperthyroidism. Patient was discharged on methimazole  3 times daily.   On presentation patient was in A-fib with heart rate of 141, otherwise stable vital, labs with platelet of 136, troponin 21,TSH 0.016 with free T42.17. Chest x-ray with no active disease. Cardiology was consulted and recommending reloading amiodarone .   6/9: Patient overnight converted to sinus rhythm and did develop some bradycardia.  Amiodarone  was discontinued.  Labs stable.  Cardiology cleared him for discharge.  Patient was asymptomatic with heart rate in  low 60s.  Cardiology will arrange EP evaluation as outpatient. Amiodarone  was not continued due to recent diagnosis of hyperthyroidism.  A referral to see endocrinology was also provided.  Patient continue on current medications and need to have a close follow-up with his providers for further assistance.  Assessment and Plan: * Atrial fibrillation with RVR (HCC) Patient was recently admitted for atrial fibrillation with RVR, with failed cardioversion requiring amiodarone .  Now presenting with similar dizziness, found to be in RVR once again.  Cardiology recommending amiodarone  given limited options in the setting of left bundle branch block.  Converted back to sinus rhythm overnight amiodarone  was discontinued and was not continued on discharge.  Cardiology will arrange EP evaluation as outpatient. - Continue home Eliquis  and metoprolol   Hyperthyroidism Patient was diagnosed with hyperthyroidism at this most recent visit.  His TPO antibodies are positive.  He is a bit out of the demographics expected with Graves' disease; wonder if this may be amiodarone  induced as he was on it for a year and a half, discontinued 6 months ago.  - Continue methimazole  - Recheck thyroid  labs in 4 to 6 weeks - Referral for outpatient endocrinology was provided  HFrEF (heart failure with moderately reduced ejection fraction) (HCC) Patient has a history of HFmrEF with recovered EF when evaluated in April 2024, at which time estimated to be 50-55%.  He appears euvolemic at this time. - Continue home regimen  Essential hypertension - Continue home regimen  Thrombocytopenia (HCC) Chronic, mild intermittent thrombocytopenia; stable at this time.  Anxiety - Continue home Lexapro   Consultants: Cardiology Procedures performed: None Disposition: Home Diet recommendation:  Discharge Diet Orders (From admission, onward)     Start     Ordered   09/09/23 0000  Diet - low  sodium heart healthy        09/09/23  1215           Cardiac diet DISCHARGE MEDICATION: Allergies as of 09/09/2023   No Known Allergies      Medication List     TAKE these medications    Eliquis  5 MG Tabs tablet Generic drug: apixaban  Take 5 mg by mouth 2 (two) times daily.   escitalopram  20 MG tablet Commonly known as: LEXAPRO  Take 20 mg by mouth at bedtime.   methimazole  5 MG tablet Commonly known as: TAPAZOLE  Take 1 tablet (5 mg total) by mouth 3 (three) times daily.   metoprolol  tartrate 25 MG tablet Commonly known as: LOPRESSOR  Take 0.5 tablets (12.5 mg total) by mouth 2 (two) times daily.   multivitamin with minerals Tabs tablet Take 1 tablet by mouth daily. Centrum Silver for Men 50+   rosuvastatin  20 MG tablet Commonly known as: CRESTOR  Take 20 mg by mouth at bedtime.   sildenafil  50 MG tablet Commonly known as: Viagra  Take 1 tablet (50 mg total) by mouth daily as needed for erectile dysfunction (take on empty stomach 30 minutes prior to sexual activity).   spironolactone 25 MG tablet Commonly known as: ALDACTONE Take 12.5 mg by mouth daily.   tamsulosin  0.4 MG Caps capsule Commonly known as: FLOMAX  Take 1 capsule (0.4 mg total) by mouth daily.        Follow-up Information     Antonette Batters, MD Follow up.   Specialties: Cardiology, Internal Medicine Why: 06/19 at 3:45 PM Contact information: [address] [place] [address] [phone]         Suzzane Estes, DO. Schedule an appointment as soon as possible for a visit in 1 week(s).   Specialty: Family Medicine Contact information: [address] [facility] [place] [address] [phone]                Discharge Exam: There were no vitals filed for this visit.  General.  Well-developed gentleman, in no acute distress. Pulmonary.  Lungs clear bilaterally, normal respiratory effort. CV.  Regular rate and rhythm, no JVD, rub or murmur. Abdomen.  Soft, nontender,  nondistended, BS positive. CNS.  Alert and oriented .  No focal neurologic deficit. Extremities.  No edema, no cyanosis, pulses intact and symmetrical. Psychiatry.  Judgment and insight appears normal.   Condition at discharge: stable  The results of significant diagnostics from this hospitalization (including imaging, microbiology, ancillary and laboratory) are listed below for reference.   Imaging Studies: DG Chest Port 1 View Result Date: 09/08/2023 CLINICAL DATA:  Chest pain and tachycardia. EXAM: PORTABLE CHEST 1 VIEW COMPARISON:  Chest x-ray 09/17/2021 FINDINGS: External pacer paddles are noted. The heart is normal in size. The mediastinal and hilar contours are normal. The lungs are clear. No infiltrates, effusions or edema. No pneumothorax. No pulmonary lesions. The bony thorax is intact. IMPRESSION: No acute cardiopulmonary findings. Electronically Signed   By: Marrian Siva M.D.   On: 09/08/2023 16:28    Microbiology: Results for orders placed or performed during the hospital encounter of 09/04/23  MRSA Next Gen by PCR, Nasal     Status: None   Collection Time: 09/04/23  9:43 AM   Specimen: Nasal Mucosa; Nasal Swab  Result Value Ref Range Status   MRSA by PCR Next Gen NOT DETECTED NOT DETECTED Final    Comment: (NOTE) The GeneXpert MRSA Assay (FDA approved for NASAL specimens only), is  one component of a comprehensive MRSA colonization surveillance program. It is not intended to diagnose MRSA infection nor to guide or monitor treatment for MRSA infections. Test performance is not FDA approved in patients less than 38 years old. Performed at [facility], [address] [place], [place] [address]     Labs: CBC: Recent Labs  Lab 09/04/23 [phone] 09/05/23 0402 09/08/23 1610 09/09/23 0359  WBC 4.7 5.5 6.1 5.1  NEUTROABS 2.9  --   --  3.0  HGB 14.9 13.3 15.1 14.0  HCT 43.2 38.2* 43.8 40.3  MCV 92.7 93.4 91.8 91.8  PLT 137* 118* 136* 137*   Basic Metabolic  Panel: Recent Labs  Lab 09/04/23 0727 09/04/23 0927 09/05/23 0402 09/08/23 1610 09/09/23 0359  NA 139  --  139 136 139  K 4.2  --  3.6 4.4 4.1  CL 108  --  112* 103 106  CO2 26  --  22 24 25   GLUCOSE 125*  --  97 119* 97  BUN 29*  --  23 28* 25*  CREATININE 0.67  --  0.66 0.91 0.78  CALCIUM  8.7*  --  8.0* 9.4 8.7*  MG  --  2.2  --  1.9 2.1   Liver Function Tests: Recent Labs  Lab 09/04/23 0727  AST 30  ALT 29  ALKPHOS 53  BILITOT 1.4*  PROT 6.8  ALBUMIN 3.9   CBG: Recent Labs  Lab 09/04/23 0940  GLUCAP 126*    Discharge time spent: greater than 30 minutes.  This record has been created using Conservation officer, historic buildings. Errors have been sought and corrected,but may not always be located. Such creation errors do not reflect on the standard of care.   Signed: Luna Salinas, MD [facility] 09/09/2023

## 2023-09-10 LAB — T3, FREE: T3, Free: 4.1 pg/mL (ref 2.0–4.4)

## 2023-09-18 ENCOUNTER — Emergency Department (HOSPITAL_COMMUNITY)

## 2023-09-18 ENCOUNTER — Inpatient Hospital Stay (HOSPITAL_COMMUNITY)
Admission: EM | Admit: 2023-09-18 | Discharge: 2023-09-19 | DRG: 309 | Disposition: A | Discharge status: Home / Self Care | Attending: Family Medicine | Admitting: Family Medicine

## 2023-09-18 ENCOUNTER — Other Ambulatory Visit: Payer: Self-pay

## 2023-09-18 ENCOUNTER — Encounter (HOSPITAL_COMMUNITY): Payer: Self-pay

## 2023-09-18 DIAGNOSIS — Z825 Family history of asthma and other chronic lower respiratory diseases: Secondary | ICD-10-CM

## 2023-09-18 DIAGNOSIS — I483 Typical atrial flutter: Secondary | ICD-10-CM | POA: Diagnosis not present

## 2023-09-18 DIAGNOSIS — Z7901 Long term (current) use of anticoagulants: Secondary | ICD-10-CM | POA: Diagnosis not present

## 2023-09-18 DIAGNOSIS — E7849 Other hyperlipidemia: Secondary | ICD-10-CM

## 2023-09-18 DIAGNOSIS — I2510 Atherosclerotic heart disease of native coronary artery without angina pectoris: Secondary | ICD-10-CM | POA: Diagnosis present

## 2023-09-18 DIAGNOSIS — I255 Ischemic cardiomyopathy: Secondary | ICD-10-CM | POA: Diagnosis present

## 2023-09-18 DIAGNOSIS — I447 Left bundle-branch block, unspecified: Secondary | ICD-10-CM | POA: Diagnosis present

## 2023-09-18 DIAGNOSIS — R7989 Other specified abnormal findings of blood chemistry: Secondary | ICD-10-CM | POA: Diagnosis not present

## 2023-09-18 DIAGNOSIS — N400 Enlarged prostate without lower urinary tract symptoms: Secondary | ICD-10-CM | POA: Diagnosis not present

## 2023-09-18 DIAGNOSIS — I480 Paroxysmal atrial fibrillation: Secondary | ICD-10-CM | POA: Diagnosis not present

## 2023-09-18 DIAGNOSIS — Y929 Unspecified place or not applicable: Secondary | ICD-10-CM | POA: Diagnosis not present

## 2023-09-18 DIAGNOSIS — Z79899 Other long term (current) drug therapy: Secondary | ICD-10-CM | POA: Diagnosis not present

## 2023-09-18 DIAGNOSIS — E0590 Thyrotoxicosis, unspecified without thyrotoxic crisis or storm: Secondary | ICD-10-CM | POA: Diagnosis present

## 2023-09-18 DIAGNOSIS — T462X5A Adverse effect of other antidysrhythmic drugs, initial encounter: Secondary | ICD-10-CM | POA: Diagnosis present

## 2023-09-18 DIAGNOSIS — Z87891 Personal history of nicotine dependence: Secondary | ICD-10-CM | POA: Diagnosis not present

## 2023-09-18 DIAGNOSIS — I10 Essential (primary) hypertension: Secondary | ICD-10-CM | POA: Diagnosis present

## 2023-09-18 DIAGNOSIS — I2489 Other forms of acute ischemic heart disease: Secondary | ICD-10-CM | POA: Diagnosis present

## 2023-09-18 DIAGNOSIS — R001 Bradycardia, unspecified: Secondary | ICD-10-CM | POA: Diagnosis present

## 2023-09-18 DIAGNOSIS — E785 Hyperlipidemia, unspecified: Secondary | ICD-10-CM | POA: Diagnosis not present

## 2023-09-18 DIAGNOSIS — E213 Hyperparathyroidism, unspecified: Secondary | ICD-10-CM | POA: Diagnosis not present

## 2023-09-18 DIAGNOSIS — I4892 Unspecified atrial flutter: Secondary | ICD-10-CM | POA: Diagnosis not present

## 2023-09-18 DIAGNOSIS — D6869 Other thrombophilia: Secondary | ICD-10-CM | POA: Diagnosis present

## 2023-09-18 DIAGNOSIS — I4819 Other persistent atrial fibrillation: Secondary | ICD-10-CM | POA: Diagnosis present

## 2023-09-18 DIAGNOSIS — Z8679 Personal history of other diseases of the circulatory system: Secondary | ICD-10-CM

## 2023-09-18 DIAGNOSIS — I5042 Chronic combined systolic (congestive) and diastolic (congestive) heart failure: Secondary | ICD-10-CM | POA: Diagnosis not present

## 2023-09-18 DIAGNOSIS — F411 Generalized anxiety disorder: Secondary | ICD-10-CM | POA: Insufficient documentation

## 2023-09-18 DIAGNOSIS — Z955 Presence of coronary angioplasty implant and graft: Secondary | ICD-10-CM | POA: Diagnosis not present

## 2023-09-18 DIAGNOSIS — Z8249 Family history of ischemic heart disease and other diseases of the circulatory system: Secondary | ICD-10-CM | POA: Diagnosis not present

## 2023-09-18 DIAGNOSIS — G4700 Insomnia, unspecified: Secondary | ICD-10-CM | POA: Diagnosis not present

## 2023-09-18 DIAGNOSIS — I110 Hypertensive heart disease with heart failure: Secondary | ICD-10-CM | POA: Diagnosis present

## 2023-09-18 DIAGNOSIS — I252 Old myocardial infarction: Secondary | ICD-10-CM

## 2023-09-18 DIAGNOSIS — I4891 Unspecified atrial fibrillation: Principal | ICD-10-CM

## 2023-09-18 LAB — CBC WITH DIFFERENTIAL/PLATELET
Abs Immature Granulocytes: 0.01 10*3/uL (ref 0.00–0.07)
Basophils Absolute: 0 10*3/uL (ref 0.0–0.1)
Basophils Relative: 0 %
Eosinophils Absolute: 0 10*3/uL (ref 0.0–0.5)
Eosinophils Relative: 1 %
HCT: 41 % (ref 39.0–52.0)
Hemoglobin: 14.3 g/dL (ref 13.0–17.0)
Immature Granulocytes: 0 %
Lymphocytes Relative: 31 %
Lymphs Abs: 1.4 10*3/uL (ref 0.7–4.0)
MCH: 32.4 pg (ref 26.0–34.0)
MCHC: 34.9 g/dL (ref 30.0–36.0)
MCV: 92.8 fL (ref 80.0–100.0)
Monocytes Absolute: 0.5 10*3/uL (ref 0.1–1.0)
Monocytes Relative: 11 %
Neutro Abs: 2.5 10*3/uL (ref 1.7–7.7)
Neutrophils Relative %: 57 %
Platelets: 134 10*3/uL — ABNORMAL LOW (ref 150–400)
RBC: 4.42 MIL/uL (ref 4.22–5.81)
RDW: 12.1 % (ref 11.5–15.5)
WBC: 4.5 10*3/uL (ref 4.0–10.5)
nRBC: 0 % (ref 0.0–0.2)

## 2023-09-18 LAB — BASIC METABOLIC PANEL WITH GFR
Anion gap: 12 (ref 5–15)
BUN: 19 mg/dL (ref 8–23)
CO2: 23 mmol/L (ref 22–32)
Calcium: 9.3 mg/dL (ref 8.9–10.3)
Chloride: 103 mmol/L (ref 98–111)
Creatinine, Ser: 0.94 mg/dL (ref 0.61–1.24)
GFR, Estimated: >60 mL/min (ref >60)
Glucose, Bld: 114 mg/dL — ABNORMAL HIGH (ref 70–99)
Potassium: 4.3 mmol/L (ref 3.5–5.1)
Sodium: 138 mmol/L (ref 135–145)

## 2023-09-18 LAB — TROPONIN I (HIGH SENSITIVITY)
Troponin I (High Sensitivity): 24 ng/L — ABNORMAL HIGH (ref <18)
Troponin I (High Sensitivity): 30 ng/L — ABNORMAL HIGH (ref <18)

## 2023-09-18 LAB — TSH: TSH: <0.01 u[IU]/mL — ABNORMAL LOW (ref 0.350–4.500)

## 2023-09-18 LAB — T4, FREE: Free T4: 2.85 ng/dL — ABNORMAL HIGH (ref 0.61–1.12)

## 2023-09-18 LAB — MAGNESIUM: Magnesium: 1.9 mg/dL (ref 1.7–2.4)

## 2023-09-18 MED ORDER — ESCITALOPRAM OXALATE 10 MG PO TABS
20.0000 mg | ORAL_TABLET | Freq: Every day | ORAL | Status: DC
  Administered 2023-09-18: 20 mg via ORAL
  Filled 2023-09-18: qty 2

## 2023-09-18 MED ORDER — SODIUM CHLORIDE 0.9% FLUSH: 3.0000 mL | INTRAVENOUS | Status: DC | PRN

## 2023-09-18 MED ORDER — MAGNESIUM SULFATE 2 GM/50ML IV SOLN
2.0000 g | INTRAVENOUS | Status: AC
  Administered 2023-09-18 (×2): 2 g via INTRAVENOUS
  Filled 2023-09-18 (×2): qty 50

## 2023-09-18 MED ORDER — DOCUSATE SODIUM 100 MG PO CAPS
100.0000 mg | ORAL_CAPSULE | Freq: Two times a day (BID) | ORAL | Status: DC
  Administered 2023-09-18: 100 mg via ORAL
  Filled 2023-09-18 (×2): qty 1

## 2023-09-18 MED ORDER — METOPROLOL TARTRATE 25 MG PO TABS
12.5000 mg | ORAL_TABLET | Freq: Two times a day (BID) | ORAL | Status: DC
  Administered 2023-09-18 – 2023-09-19 (×2): 12.5 mg via ORAL
  Filled 2023-09-18 (×2): qty 1

## 2023-09-18 MED ORDER — APIXABAN 5 MG PO TABS: 5.0000 mg | ORAL_TABLET | Freq: Two times a day (BID) | ORAL | Status: DC

## 2023-09-18 MED ORDER — AMIODARONE HCL IN DEXTROSE 360-4.14 MG/200ML-% IV SOLN
60.0000 mg/h | INTRAVENOUS | Status: AC
  Administered 2023-09-18 (×2): 60 mg/h via INTRAVENOUS
  Filled 2023-09-18 (×2): qty 200

## 2023-09-18 MED ORDER — ONDANSETRON HCL 4 MG/2ML IJ SOLN: 4.0000 mg | Freq: Four times a day (QID) | INTRAMUSCULAR | Status: DC | PRN

## 2023-09-18 MED ORDER — AMIODARONE LOAD VIA INFUSION
150.0000 mg | Freq: Once | INTRAVENOUS | Status: AC
  Administered 2023-09-18: 150 mg via INTRAVENOUS
  Filled 2023-09-18: qty 83.34

## 2023-09-18 MED ORDER — SPIRONOLACTONE 12.5 MG HALF TABLET
12.5000 mg | ORAL_TABLET | Freq: Every morning | ORAL | Status: DC
  Administered 2023-09-19: 12.5 mg via ORAL
  Filled 2023-09-18: qty 1

## 2023-09-18 MED ORDER — HEPARIN (PORCINE) 25000 UT/250ML-% IV SOLN
1600.0000 [IU]/h | INTRAVENOUS | Status: DC
  Administered 2023-09-18: 1250 [IU]/h via INTRAVENOUS
  Filled 2023-09-18: qty 250

## 2023-09-18 MED ORDER — SODIUM CHLORIDE 0.9% FLUSH
3.0000 mL | Freq: Two times a day (BID) | INTRAVENOUS | Status: DC
Start: 2023-09-18 — End: 2023-09-19
  Administered 2023-09-18: 3 mL via INTRAVENOUS

## 2023-09-18 MED ORDER — TAMSULOSIN HCL 0.4 MG PO CAPS
0.4000 mg | ORAL_CAPSULE | Freq: Every day | ORAL | Status: DC
  Administered 2023-09-19: 0.4 mg via ORAL
  Filled 2023-09-18: qty 1

## 2023-09-18 MED ORDER — AMIODARONE HCL IN DEXTROSE 360-4.14 MG/200ML-% IV SOLN: 30.0000 mg/h | INTRAVENOUS | Status: DC

## 2023-09-18 MED ORDER — SODIUM CHLORIDE 0.9 % IV SOLN
250.0000 mL | INTRAVENOUS | Status: DC | PRN
Start: 2023-09-18 — End: 2023-09-19

## 2023-09-18 MED ORDER — ROSUVASTATIN CALCIUM 20 MG PO TABS
20.0000 mg | ORAL_TABLET | Freq: Every day | ORAL | Status: DC
  Administered 2023-09-18: 20 mg via ORAL
  Filled 2023-09-18: qty 1

## 2023-09-18 MED ORDER — METHIMAZOLE 5 MG PO TABS
5.0000 mg | ORAL_TABLET | Freq: Three times a day (TID) | ORAL | Status: DC
  Administered 2023-09-18 – 2023-09-19 (×2): 5 mg via ORAL
  Filled 2023-09-18 (×4): qty 1

## 2023-09-18 MED ORDER — ONDANSETRON HCL 4 MG PO TABS: 4.0000 mg | ORAL_TABLET | Freq: Four times a day (QID) | ORAL | Status: DC | PRN

## 2023-09-18 MED ORDER — DOXYLAMINE SUCCINATE (SLEEP) 25 MG PO TABS: 12.5000 mg | ORAL_TABLET | Freq: Every evening | ORAL | Status: DC | PRN

## 2023-09-18 MED ORDER — SODIUM CHLORIDE 0.9% FLUSH
3.0000 mL | Freq: Two times a day (BID) | INTRAVENOUS | Status: DC
  Administered 2023-09-18: 3 mL via INTRAVENOUS

## 2023-09-18 MED ORDER — ACETAMINOPHEN 325 MG PO TABS: 650.0000 mg | ORAL_TABLET | Freq: Four times a day (QID) | ORAL | Status: DC | PRN

## 2023-09-18 MED ORDER — ACETAMINOPHEN 650 MG RE SUPP: 650.0000 mg | Freq: Four times a day (QID) | RECTAL | Status: DC | PRN

## 2023-09-18 NOTE — ED Triage Notes (Signed)
 Pt reports he believes he is in AFIB. He reports hx of and states he can tell when he is in it because he feels dizzy. Denies CP/heart palpitations. He has an ablation scheduled at [facility] on 7/30.  HR 143 in triage

## 2023-09-18 NOTE — Consult Note (Addendum)
 Cardiology Consultation   Patient ID: ERASTUS BARTOLOMEI MRN: [id number]; DOB: 12/27/1955  Admit date: 09/18/2023 Date of Consult: 09/18/2023  PCP:  Victor Grapes, PA-C   [facility] Providers Cardiologist:  [facility] Dr Beau Bound   Patient Profile: TRAVEN DAVIDS is a 68 y.o. male with a hx of paroxymal A fib/flutter, chronic combined systolic and diastolic heart failure, coronary artery disease s/p stent to LAD (08/2021), HTN, HLD, bradycardia, LBBB, hyperthyroidism,  who is being seen 09/18/2023 for the evaluation of A fib RVR at the request of Dr Manus Sellers.  History of Present Illness: Mr. Parke with above PMH presented to ER today c/o dizziness and irregular heartbeat. He felt he was in A fib since this morning. He reports he has a fib ablation scheduled on 10/30/23 with [facility] cardiology. He states his typical symptoms for A fib is dizziness and lightheadedness. He felt he can barely function due to A fib, feels fearful to walk. He feels slightly improved now at ER. He denied any fever or chills, chest pain, leg edema, rapid weight gain, orthopnea.   Per ER work up today, BMP grossly unremarkable. Hs trop 24 x1. CBC diff showed PLT 134k. TSH <0.01 and FT4 elevated at 2.85. CXR showed no acute disease. EKG concerning for atrial flutter RVR 143 bpm. He was started on amiodarone  bolus followed by gtt. Cardiology is consulted for further evaluation.    Per chart review, he follows [facility] Dr Beau Bound for cardiology. He had NSTEMI/ CAD in 08/2021, cardiac cath at outside facility at the time showed 100% mid LAD 2 and 70% mid LAD 1, 75% D2, and 50% dist RCA. He was treated with DES to mid LAD. He was noted with moderate LV systolic dysfunction with EF 35-45%. He has ischemic cardiomyopathy and chronic combined CHF, has been on GDMT with losartan  25mg , crestor  20mg , spironolactone  12.5mg  based on cardiology office visit on 03/07/23 on care everywhere. Echo last repeated on 07/29/22 at [facility]  showed LVEF improved to 50-55%, mild to moderate MR, mild  AI and TR.   He also suffers paroxymal A fib, historically on anticoagulation with Eliquis . He had complained lightheadedness during office visit 12/31/22, coreg 3.125mg  BID was stopped and amiodarone  was reduced to 50mg  daily. He was noted some baseline mild bradycardia in the high 50s. He was last seen by cardiology 07/09/23 in the office, c/o ongoing dizziness and vertigo, amiodarone  was discontinued and he was given meclizine.    He was recently hospitalized for A fib RVR twice in June 2025. He was hospitalized 6/4-6/5 for A fib RVR 160s, had palpitation, lightheadedness, dizziness. DCCV x3 at ER was not successful, he was loaded with amiodarone  and had subsequent conversion to sinus rhythm. Meanwhile he was found to have hyperthyroidism with TSH 0.017, T4 2.21. Amiodarone  was stopped, he was discharged on metoprolol  12.5mg  BID and continued on Eliquis . He was arranged to follow up with EP for possible ablation. He was started methimazole  5mg  and referred to Endocrinology for hyperthyroidism.   He was hospitlaized again 6/8 -6/9 for A fib RVR 150s, c/o dizziness and lightheadedness while playing golf. He was given amiodarone  gtt and had conversion to sinus rhythm overnight. He was discharged again on metoprolol  12.5mg  BID and continued on Eliquis .  He was arranged appt with EP on 09/13/23, saw Dr Andy Bannister, arranged for A fib ablation late July.   Echo repeated on [facility] on 09/11/23 showed LVEF 55%, mod LVH, normal diastolic function, normal RV, mild AR, MILD  MR, trivial PR, mild TR, RVSP .     Past Medical History:  Diagnosis Date   A-fib (HCC)    Anxiety    HLD (hyperlipidemia)    HTN (hypertension)     Past Surgical History:  Procedure Laterality Date   CORONARY STENT INTERVENTION N/A 09/18/2021   Procedure: CORONARY STENT INTERVENTION;  Surgeon: Antonette Batters, MD;  Location: [facility] INVASIVE CV LAB;  Service: Cardiovascular;   Laterality: N/A;   FACIAL FRACTURE SURGERY  1980   LEFT HEART CATH AND CORONARY ANGIOGRAPHY Left 04/11/2021   Procedure: LEFT HEART CATH AND CORONARY ANGIOGRAPHY;  Surgeon: Cherrie Cornwall, MD;  Location: [facility] INVASIVE CV LAB;  Service: Cardiovascular;  Laterality: Left;   LEFT HEART CATH AND CORONARY ANGIOGRAPHY N/A 09/18/2021   Procedure: LEFT HEART CATH AND CORONARY ANGIOGRAPHY;  Surgeon: Antonette Batters, MD;  Location: [facility] INVASIVE CV LAB;  Service: Cardiovascular;  Laterality: N/A;   LUMBAR LAMINECTOMY/DECOMPRESSION MICRODISCECTOMY N/A 08/26/2019   Procedure: RIGHT L4-5 MICRODISCECTOMY;  Surgeon: Jodeen Munch, MD;  Location: [facility] ORS;  Service: Neurosurgery;  Laterality: N/A;     Home Medications:  Prior to Admission medications   Medication Sig Start Date End Date Taking? Authorizing Provider  dronedarone (MULTAQ) 400 MG tablet Take 400 mg by mouth 2 (two) times daily with a meal. 09/13/23 09/12/24 Yes [provider]  ELIQUIS  5 MG TABS tablet Take 5 mg by mouth 2 (two) times daily. 08/17/21  Yes [provider]  escitalopram  (LEXAPRO ) 20 MG tablet Take 20 mg by mouth at bedtime. 11/05/16  Yes [provider]  methimazole  (TAPAZOLE ) 5 MG tablet Take 1 tablet (5 mg total) by mouth 3 (three) times daily. 09/05/23 10/05/23 Yes Brenna Cam, MD  metoprolol  tartrate (LOPRESSOR ) 50 MG tablet Take 50 mg by mouth 2 (two) times daily. 09/17/23 09/16/24 Yes [provider]  Multiple Vitamin (MULTIVITAMIN WITH MINERALS) TABS tablet Take 1 tablet by mouth daily. Centrum Silver for Men 50+   Yes [provider]  rosuvastatin  (CRESTOR ) 20 MG tablet Take 20 mg by mouth at bedtime.   Yes [provider]  spironolactone  (ALDACTONE ) 25 MG tablet Take 12.5 mg by mouth daily. 07/16/23  Yes [provider]  tamsulosin  (FLOMAX ) 0.4 MG CAPS capsule Take 1 capsule (0.4 mg total) by mouth daily. 08/29/23  Yes Lawerence Pressman, MD  metoprolol  tartrate  (LOPRESSOR ) 25 MG tablet Take 0.5 tablets (12.5 mg total) by mouth 2 (two) times daily. 09/05/23 10/05/23  Brenna Cam, MD  sildenafil  (VIAGRA ) 50 MG tablet Take 1 tablet (50 mg total) by mouth daily as needed for erectile dysfunction (take on empty stomach 30 minutes prior to sexual activity). 08/29/23   Lawerence Pressman, MD    Scheduled Meds:  apixaban   5 mg Oral BID   docusate sodium  100 mg Oral BID   escitalopram   20 mg Oral QHS   methimazole   5 mg Oral TID   metoprolol  tartrate  12.5 mg Oral BID   rosuvastatin   20 mg Oral QHS   sodium chloride  flush  3 mL Intravenous Q12H   sodium chloride  flush  3 mL Intravenous Q12H   [START ON 09/19/2023] spironolactone   12.5 mg Oral q morning   [START ON 09/19/2023] tamsulosin   0.4 mg Oral Daily   Continuous Infusions:  sodium chloride      amiodarone  60 mg/hr (09/18/23 1818)   Followed by   Cecily Cohen ON 09/19/2023] amiodarone      magnesium sulfate bolus IVPB 2 g (  09/18/23 1953)   PRN Meds: sodium chloride , acetaminophen  **OR** acetaminophen , doxylamine  (Sleep), ondansetron  **OR** ondansetron  (ZOFRAN ) IV, sodium chloride  flush  Allergies:   No Known Allergies  Social History:   Social History   Socioeconomic History   Marital status: Married    Spouse name: Not on file   Number of children: Not on file   Years of education: Not on file   Highest education level: Not on file  Occupational History   Not on file  Tobacco Use   Smoking status: Former    Current packs/day: 0.00    Types: Cigarettes    Quit date: 04/02/1988    Years since quitting: 35.4    Passive exposure: Past   Smokeless tobacco: Never  Vaping Use   Vaping status: Not on file  Substance and Sexual Activity   Alcohol use: No   Drug use: Never   Sexual activity: Not on file  Other Topics Concern   Not on file  Social History Narrative   Not on file   Social Drivers of Health   Financial Resource Strain: Not on file  Food Insecurity: No Food Insecurity (09/04/2023)    Hunger Vital Sign    Worried About Running Out of Food in the Last Year: Never true    Ran Out of Food in the Last Year: Never true  Transportation Needs: No Transportation Needs (09/04/2023)   PRAPARE - Administrator, Civil Service (Medical): No    Lack of Transportation (Non-Medical): No  Physical Activity: Not on file  Stress: Not on file  Social Connections: Moderately Integrated (09/04/2023)   Social Connection and Isolation Panel    Frequency of Communication with Friends and Family: More than three times a week    Frequency of Social Gatherings with Friends and Family: More than three times a week    Attends Religious Services: More than 4 times per year    Active Member of [employer] or Organizations: No    Attends Banker Meetings: Never    Marital Status: Married  Catering manager Violence: Not At Risk (09/04/2023)   Humiliation, Afraid, Rape, and Kick questionnaire    Fear of Current or Ex-Partner: No    Emotionally Abused: No    Physically Abused: No    Sexually Abused: No    Family History:    Family History  Problem Relation Age of Onset   COPD Mother    Heart attack Father    Valvular heart disease Sister      ROS:  Please see the history of present illness.  All other ROS reviewed and negative.     Physical Exam/Data: Vitals:   09/18/23 1900 09/18/23 1915 09/18/23 1916 09/18/23 1930  BP: 102/80 118/85  101/88  Pulse: (!) 103  (!) 131 94  Resp: 19 19 (!) 27 11  Temp:      TempSrc:      SpO2: 96% 94% 99% 100%  Weight:      Height:       No intake or output data in the 24 hours ending 09/18/23 2007    09/18/2023    4:56 PM 09/04/2023    7:19 AM 08/29/2023    9:08 AM  Last 3 Weights  Weight (lbs) 200 lb 205 lb 205 lb  Weight (kg) 90.719 kg 92.987 kg 92.987 kg     Body mass index is 27.12 kg/m.   Vitals:  Vitals:   09/18/23 1916 09/18/23 1930  BP:  101/88  Pulse: (!) 131 94  Resp: (!) 27 11  Temp:    SpO2: 99% 100%   General  Appearance: In no apparent distress, laying in bed HEENT: Normocephalic, atraumatic.  Neck: Supple, trachea midline, no JVDs Cardiovascular: Irregularly irregular, S1S2, no murmur  Respiratory: Resting breathing unlabored, lungs sounds clear to auscultation bilaterally, no use of accessory muscles. On room air.  No wheezes, rales or rhonchi.   Gastrointestinal: Bowel sounds positive, abdomen soft, non-tender, non-distended.  Extremities: Able to move all extremities in bed without difficulty, no edema of BLE  Musculoskeletal: Normal muscle bulk and tone Skin: Intact, warm, dry.  Neurologic: Alert, oriented to person, place and time.  no cognitive deficit, no gross focal neuro deficit Psychiatric: Normal affect. Mood is appropriate.      EKG:  The EKG was personally reviewed and demonstrates:    EKG today showed A flutter RVR 143 bpm  Telemetry:  Telemetry was personally reviewed and demonstrates:    Initially A flutter RVR 150s, currently in A fib 100-130s  Relevant CV Studies:   Echo at [facility] on 09/11/23:  NORMAL LEFT VENTRICULAR SYSTOLIC FUNCTION WITH MODERATE LVH  ESTIMATED EF: 55%, CALC EF(2D): 53%  NORMAL LA PRESSURES WITH NORMAL DIASTOLIC FUNCTION  NORMAL RIGHT VENTRICULAR SYSTOLIC FUNCTION  VALVULAR REGURGITATION: MILD AR, MILD MR, TRIVIAL PR, MILD TR  ESTIMATED RVSP: 31 mmHg  NO VALVULAR STENOSIS    Non-STEMI cardiac cath PCI 08/2021 (obtained from cardiology office note on 03/07/23 on care everywhere):   Mid LAD-2 lesion is 100% stenosed.  Mid LAD-1 lesion is 70% stenosed.  2nd Diag lesion is 75% stenosed.  Dist RCA lesion is 50% stenosed.  A drug-eluting stent was successfully placed using a STENT ONYX  FRONTIER 2.5X22.  Post intervention, there is a 0% residual stenosis.  There is moderate left ventricular systolic dysfunction.  LV end diastolic pressure is mildly elevated.  The left ventricular ejection fraction is 35-45% by visual estimate.   Conclusion   Moderately reduced left ventricular function with anterior apical akinesis  EF around 35 to 40%   Coronary  Left main large relatively free of disease  LAD large with a 75% proximal 100% mid TIMI 0 flow  Diagonal 2 had a 80% proximal lesion TIMI-3 flow  Circumflex large minor irregularities  RCA with large minor irregularities with a 50% distal lesion TIMI-3 flow   Intervention  Successful PCI and stent of mid LAD with DES stent  Onyx frontier 2.5 x 22 mm stent was deployed  Postdilated with a [place] trek Neo at 14 atm  Lesion was reduced from 100% down to 0% and TIMI-3 flow was restored from  TIMI 0  Minx was deployed in the right groin  Patient tolerated procedure well  No complications    Laboratory Data: High Sensitivity Troponin:   Recent Labs  Lab 09/04/23 0727 09/04/23 0927 09/08/23 1610 09/08/23 1832 09/18/23 1700  TROPONINIHS 65* 70* 21* 25* 24*     Chemistry Recent Labs  Lab 09/18/23 1700  NA 138  K 4.3  CL 103  CO2 23  GLUCOSE 114*  BUN 19  CREATININE 0.94  CALCIUM  9.3  MG 1.9  GFRNONAA >60  ANIONGAP 12    No results for input(s): PROT, ALBUMIN, AST, ALT, ALKPHOS, BILITOT in the last 168 hours. Lipids No results for input(s): CHOL, TRIG, HDL, LABVLDL, LDLCALC, CHOLHDL in the last 168 hours.  Hematology Recent Labs  Lab 09/18/23 1700  WBC 4.5  RBC 4.42  HGB 14.3  HCT 41.0  MCV 92.8  MCH 32.4  MCHC 34.9  RDW 12.1  PLT 134*   Thyroid   Recent Labs  Lab 09/18/23 1700  TSH <0.010*  FREET4 2.85*    BNPNo results for input(s): BNP, PROBNP in the last 168 hours.  DDimer No results for input(s): DDIMER in the last 168 hours.  Radiology/Studies:  DG Chest Portable 1 View Result Date: 09/18/2023 CLINICAL DATA:  sob EXAM: PORTABLE CHEST 1 VIEW COMPARISON:  Chest x-ray 09/08/2023 FINDINGS: The heart and mediastinal contours are within normal limits. No focal consolidation. No pulmonary edema. No pleural effusion. No  pneumothorax. No acute osseous abnormality. IMPRESSION: No active disease. Electronically Signed   By: Morgane  Naveau M.D.   On: 09/18/2023 18:31     Assessment and Plan:   Paroxysmal A fib RVR  - presented with recurrent lightheadedness and irregular heart rate, has been having ongoing A fib RVR recurrently required hospitalization twice in June 2025, failed DCCV x3 at ER, converted on amiodarone  gtt, unable go home with PO amiodarone  due to newly diagnosed hyperthyroidism, scheduled to have ablation on 10/30/23 [facility]  - hx of MI with mid LAD stent, not candidate for flecainide; new onset of hyperthyroidism, not candidate for amiodarone ; not candidate for dofetilide needs amiodarone  wash out;  ablation for rhythm control is arranged 7/30, would consult EP in AM for further input - Mag 1.9, renal function WNL, will load with 4g IV Mag  - continue Eliquis  5mg  BID , has been compliant   CAD with hx of mid LAD stent on 08/2021  Elevated trop - denied any chest pain, suspect demand ischemia  - continue PTA medical therapy with metoprolol  12.5mg  BID, losartan  25mg , crestor  20mg  daily; no ASA due to Eliquis  use  Chronic HF with improved LVEF Ischemic cardiomyopathy  - LVEF was down to 35% in the setting of NSTEMI 08/2021, LVEF improved to 50-55% on Echo 07/2022, most recent Echo from 09/2023 at [facility] showed showed LVEF 55%, mod LVH, normal diastolic function, normal RV, mild AR, MILD MR, trivial PR, mild TR, RVSP .  - Euvolemic on exam  - continue PTA GDMT with losartan  and spironolactone , would transition metoprolol  to XL 25mg  daily, defer to primary cardiologist at [facility] for further changes     Risk Assessment/Risk Scores: { [place]  Heart Association (NYHA) Functional Class NYHA Class I  CHA2DS2-VASc Score = 4  This indicates a 4.8% annual risk of stroke. The patient's score is based upon: CHF History: 1 HTN History: 1 Diabetes History: 0 Stroke History: 0 Vascular Disease  History: 1 Age Score: 1 Gender Score: 0    For questions or updates, please contact [facility] Please consult [email or web address] for contact info under    Signed, Xika Zhao, NP  09/18/2023 8:07 PM  I have personally seen and examined the patient.  My HPI, Exam, and assessment and plan are below, independent of the NPP above.  Mr. Stillings with atrial fibrillation and hyperthyroidism, presents with a recurrence of atrial fibrillation and new atrial flutter. He was referred by his primary cardiologist, Dr. Ardena Koyanagi, for evaluation and management of atrial fibrillation.  He experienced a recurrence of atrial fibrillation this morning, with an initial heart rate in the 150s. He was started on amiodarone , and his heart rate has since decreased to between 100 and 130 beats per minute. He feels slightly better but not fully settled. No signs of heart failure. His ejection fraction had decreased but has  since recovered to 55%. The most recent echocardiogram, performed on June 11th, was normal.  He has a history of coronary artery disease and hyperlipidemia. His CHADS2-VASc score is elevated, and he has been managing well on blood thinners. He has previously been hospitalized for atrial fibrillation and was initially loaded with amiodarone  on June 5th during his first hospitalization. He has also been given high-dose magnesium for rate control and potential conversion.  He has a primary issue with hyperthyroidism, which was incidentally discovered. 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 No signs of Graves' disease, such as exophthalmos.  He is planned for ablation 10/30/23  Exam notable for  Gen: no distress Ears:  Samuel Crock Sign Cardiac: No Rubs or Gallops, no Murmur, IRIR tachycardia +2 radial pulses Respiratory: Clear to auscultation bilaterally, normal effort, normal  respiratory rate GI: Soft, nontender, non-distended   MS: No  edema;  moves all extremities Integument: Skin feels warm Neuro:  At time of evaluation, alert and oriented to person/place/time/situation  Psych: Normal affect, patient feels fair   TSH is still undetectable EKG AF RVR RBBB Tele: AF RVR rates 120  In assessment and plan:   Atrial fibrillation and atrial flutter Recurrent atrial fibrillation with rates initially in the 150s, now reduced to 100-130s with amiodarone . Atypical atrial flutter. Previous failure of Multaq, possibly due to non-receipt. No heart failure; ejection fraction recovered to 55%. Limited antiarrhythmic options due to hyperthyroidism. Temporary management with amiodarone  and magnesium while awaiting further intervention. - Continue short course of amiodarone . - Administer high dose magnesium for rate control and potential conversion. - Consult with electrophysiologist for potential ablation. - Patient is pending endocrinology evaluation for hyperthyroidism management. - Ultimately if he does not get aggressive arrhythmia care or aggressive endocrinology  care he will end up getting re-admitted  Hyperthyroidism Incidentally discovered hyperthyroidism. No signs of Graves' disease. 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 Limited treatment options complicate arrhythmia management. - as per primary  Coronary artery disease HLD - on AC with no missed doses; he needs his PM dose tonight - BB for AF, CAD and thyroid  disease - on statin - euvolemic   Gloriann Larger, MD FASE [facility] Cardiologist [facility]  [address], [address] [place], [place] [address] [phone]  8:21 PM

## 2023-09-18 NOTE — Progress Notes (Signed)
 PHARMACY - ANTICOAGULATION CONSULT NOTE  Pharmacy Consult for heparin  dosing Indication: atrial fibrillation  No Known Allergies  Patient Measurements: Height: 6' (182.9 cm) Weight: 90.7 kg (200 lb) IBW/kg (Calculated) : 77.6 HEPARIN  DW (KG): 90.7  Vital Signs: Temp: 98.2 F (36.8 C) (06/18 1657) Temp Source: Oral (06/18 1657) BP: 101/88 (06/18 1930) Pulse Rate: 94 (06/18 1930)  Labs: Recent Labs    09/18/23 1700  HGB 14.3  HCT 41.0  PLT 134*  CREATININE 0.94  TROPONINIHS 24*    Estimated Creatinine Clearance: 84.8 mL/min (by C-G formula based on SCr of 0.94 mg/dL).   Medical History: Past Medical History:  Diagnosis Date   A-fib (HCC)    Anxiety    HLD (hyperlipidemia)    HTN (hypertension)     Assessment: 26 YOM presented with dizziness and irregular heartbeat. Has a history of paroxymal afib/futter on apixaban  prior to arrival. Last dose 6/18 at 7am. Will require aPTT monitoring due to likely falsely high anti-Xa level secondary to DOAC use. Pharmacy consulted for heparin  dosing for possible intervention.   Goal of Therapy:  Heparin  level 0.3-0.7 units/ml Monitor platelets by anticoagulation protocol: Yes   Plan:  Start heparin  infusion at 1250 units/hr with no bolus Check aPTT & anti-Xa level in 6 hours and daily while on heparin  Continue to monitor via aPTT until levels are correlated Monitor daily heparin  level and CBC F/u ability to transition back to Eliquis   Ples Trudel 09/18/2023,8:42 PM

## 2023-09-18 NOTE — Care Management Obs Status (Incomplete)
 MEDICARE OBSERVATION STATUS NOTIFICATION   Patient Details  Name: TRYSON LUMLEY MRN: [id number] Date of Birth: 11-15-1955   Medicare Observation Status Notification Given:       Diany Formosa A Ryker [place], RN 09/18/2023, 12:03 PM

## 2023-09-18 NOTE — ED Provider Notes (Signed)
 [place] [facility]PART[facility] AT  HOSPITAL Provider Note   CSN: [id number] Arrival date & time: 09/18/23  1642    Patient presents with: Dizziness and Irregular Heart Beat   Tommy Rowe is a 68 y.o. male history of recurrent atrial fibrillation, on anticoagulation, hypothyroidism here for evaluation of irregular heartbeat and dizziness.  Feels consistent when he has been in A-fib previously.  He has had 2 admissions over the last 2 weeks with similar symptoms.  Was admitted to [facility] at that time.  Since that he is seen by cardiology both times.  First admission attempted cardioversion x 3 without improvement.  Last admission was not cardioverted however started on amiodarone .  Was also found to have hyperthyroidism.  Was started on Multaq as well as increase in his metoprolol  to 50 mg twice daily by cards.  Patient states has been feeling otherwise well.  He is post have cardioversion at [facility] at the end of July.  States he noted his elevated heart rate earlier today.  No fever, nausea, vomiting, abdominal pain.  No peristolic to extremities.  He is compliant with his medications.   HPI     Prior to Admission medications   Medication Sig Start Date End Date Taking? Authorizing Provider  desonide (DESOWEN) 0.05 % ointment Apply 1 Application topically daily as needed.   Yes [provider]  doxylamine , Sleep, (UNISOM ) 25 MG tablet Take 12.5 mg by mouth at bedtime as needed for sleep.   Yes [provider]  dronedarone (MULTAQ) 400 MG tablet Take 400 mg by mouth 2 (two) times daily with a meal. 09/13/23 09/12/24 Yes [provider]  ELIQUIS  5 MG TABS tablet Take 5 mg by mouth 2 (two) times daily. 08/17/21  Yes [provider]  escitalopram  (LEXAPRO ) 20 MG tablet Take 20 mg by mouth at bedtime. 11/05/16  Yes [provider]  methimazole  (TAPAZOLE ) 5 MG tablet Take 1 tablet (5 mg total) by mouth 3 (three) times daily. 09/05/23  10/05/23 Yes Brenna Cam, MD  metoprolol  tartrate (LOPRESSOR ) 50 MG tablet Take 50 mg by mouth 2 (two) times daily. 09/17/23 09/16/24 Yes [provider]  Multiple Vitamin (MULTIVITAMIN WITH MINERALS) TABS tablet Take 1 tablet by mouth every morning. Centrum Silver for Men 50+   Yes [provider]  rosuvastatin  (CRESTOR ) 20 MG tablet Take 20 mg by mouth at bedtime.   Yes [provider]  spironolactone  (ALDACTONE ) 25 MG tablet Take 12.5 mg by mouth every morning. 07/16/23  Yes [provider]  tamsulosin  (FLOMAX ) 0.4 MG CAPS capsule Take 1 capsule (0.4 mg total) by mouth daily. Patient taking differently: Take 0.4 mg by mouth every morning. 08/29/23  Yes Lawerence Pressman, MD  triamcinolone cream (KENALOG) 0.1 % Apply 1 Application topically daily as needed.   Yes [provider]  metoprolol  tartrate (LOPRESSOR ) 25 MG tablet Take 0.5 tablets (12.5 mg total) by mouth 2 (two) times daily. Patient not taking: Reported on 09/18/2023 09/05/23 10/05/23  Brenna Cam, MD    Allergies: Patient has no known allergies.    Review of Systems  Constitutional: Negative.   HENT: Negative.    Respiratory: Negative.    Cardiovascular:  Positive for palpitations. Negative for chest pain and leg swelling.  Gastrointestinal: Negative.   Genitourinary: Negative.   Musculoskeletal: Negative.   Skin: Negative.   Neurological:  Positive for dizziness. Negative for tremors, syncope, speech difficulty, weakness, light-headedness, numbness and headaches.  All other systems reviewed and  are negative.   Updated Vital Signs BP 101/88   Pulse 94   Temp 98.2 F (36.8 C) (Oral)   Resp 11   Ht 6' (1.829 m)   Wt 90.7 kg   SpO2 100%   BMI 27.12 kg/m   Physical Exam Vitals and nursing note reviewed.  Constitutional:      General: He is not in acute distress.    Appearance: He is well-developed. He is not ill-appearing, toxic-appearing or diaphoretic.  HENT:     Head:  Normocephalic and atraumatic.     Mouth/Throat:     Mouth: Mucous membranes are moist.   Eyes:     Pupils: Pupils are equal, round, and reactive to light.    Cardiovascular:     Rate and Rhythm: Tachycardia present. Rhythm irregularly irregular.     Pulses: Normal pulses.          Radial pulses are 2+ on the right side and 2+ on the left side.       Popliteal pulses are 2+ on the right side and 2+ on the left side.     Heart sounds: Normal heart sounds.     Comments: A-fib with RVR Pulmonary:     Effort: Pulmonary effort is normal. No respiratory distress.     Breath sounds: Normal breath sounds.  Abdominal:     General: There is no distension.     Palpations: Abdomen is soft.     Tenderness: There is no abdominal tenderness. There is no right CVA tenderness, left CVA tenderness, guarding or rebound.   Musculoskeletal:        General: No swelling or tenderness. Normal range of motion.     Cervical back: Normal range of motion and neck supple.     Right lower leg: No edema.     Left lower leg: No edema.   Skin:    General: Skin is warm and dry.     Capillary Refill: Capillary refill takes less than 2 seconds.   Neurological:     General: No focal deficit present.     Mental Status: He is alert and oriented to person, place, and time.     (all labs ordered are listed, but only abnormal results are displayed) Labs Reviewed  CBC WITH DIFFERENTIAL/PLATELET - Abnormal; Notable for the following components:      Result Value   Platelets 134 (*)    All other components within normal limits  BASIC METABOLIC PANEL WITH GFR - Abnormal; Notable for the following components:   Glucose, Bld 114 (*)    All other components within normal limits  TSH - Abnormal; Notable for the following components:   TSH <0.010 (*)    All other components within normal limits  T4, FREE - Abnormal; Notable for the following components:   Free T4 2.85 (*)    All other components within normal  limits  TROPONIN I (HIGH SENSITIVITY) - Abnormal; Notable for the following components:   Troponin I (High Sensitivity) 24 (*)    All other components within normal limits  MAGNESIUM  COMPREHENSIVE METABOLIC PANEL WITH GFR  CBC  MAGNESIUM  TROPONIN I (HIGH SENSITIVITY)    EKG: EKG Interpretation Date/Time:  Wednesday September 18 2023 16:59:31 EDT Ventricular Rate:  143 PR Interval:  122 QRS Duration:  128 QT Interval:  338 QTC Calculation: 521 R Axis:   76  Text Interpretation: aflutter with RVR Non-specific intra-ventricular conduction block T wave abnormality, consider inferolateral ischemia Abnormal ECG  When compared with ECG of 09-Sep-2023 12:11, PREVIOUS ECG IS PRESENT when compared to prior, now appears to show aflutter with RVR No STEMI Confirmed by Wynell Heath ([id number]) on 09/18/2023 5:36:10 PM  Radiology: DG Chest Portable 1 View Result Date: 09/18/2023 CLINICAL DATA:  sob EXAM: PORTABLE CHEST 1 VIEW COMPARISON:  Chest x-ray 09/08/2023 FINDINGS: The heart and mediastinal contours are within normal limits. No focal consolidation. No pulmonary edema. No pleural effusion. No pneumothorax. No acute osseous abnormality. IMPRESSION: No active disease. Electronically Signed   By: Morgane  Naveau M.D.   On: 09/18/2023 18:31     .Critical Care  Performed by: Dickson Founds, PA-C Authorized by: Dickson Founds, PA-C   Critical care provider statement:    Critical care time (minutes):  35   Critical care was necessary to treat or prevent imminent or life-threatening deterioration of the following conditions:  Cardiac failure   Critical care was time spent personally by me on the following activities:  Development of treatment plan with patient or surrogate, discussions with consultants, evaluation of patient's response to treatment, examination of patient, ordering and review of laboratory studies, ordering and review of radiographic studies, ordering and performing treatments  and interventions, pulse oximetry, re-evaluation of patient's condition and review of old charts    Medications Ordered in the ED  amiodarone  (NEXTERONE  PREMIX) 360-4.14 MG/200ML-% (1.8 mg/mL) IV infusion (60 mg/hr Intravenous New Bag/Given 09/18/23 1818)    Followed by  amiodarone  (NEXTERONE  PREMIX) 360-4.14 MG/200ML-% (1.8 mg/mL) IV infusion (has no administration in time range)  metoprolol  tartrate (LOPRESSOR ) tablet 12.5 mg (has no administration in time range)  rosuvastatin  (CRESTOR ) tablet 20 mg (has no administration in time range)  spironolactone  (ALDACTONE ) tablet 12.5 mg (has no administration in time range)  doxylamine  (Sleep) (UNISOM ) tablet 12.5 mg (has no administration in time range)  escitalopram  (LEXAPRO ) tablet 20 mg (has no administration in time range)  methimazole  (TAPAZOLE ) tablet 5 mg (has no administration in time range)  tamsulosin  (FLOMAX ) capsule 0.4 mg (has no administration in time range)  sodium chloride  flush (NS) 0.9 % injection 3 mL (has no administration in time range)  sodium chloride  flush (NS) 0.9 % injection 3 mL (has no administration in time range)  sodium chloride  flush (NS) 0.9 % injection 3 mL (has no administration in time range)  0.9 %  sodium chloride  infusion (has no administration in time range)  acetaminophen  (TYLENOL ) tablet 650 mg (has no administration in time range)    Or  acetaminophen  (TYLENOL ) suppository 650 mg (has no administration in time range)  ondansetron  (ZOFRAN ) tablet 4 mg (has no administration in time range)    Or  ondansetron  (ZOFRAN ) injection 4 mg (has no administration in time range)  docusate sodium (COLACE) capsule 100 mg (has no administration in time range)  magnesium sulfate IVPB 2 g 50 mL (2 g Intravenous New Bag/Given 09/18/23 1953)  apixaban  (ELIQUIS ) tablet 5 mg (has no administration in time range)  amiodarone  (NEXTERONE ) 1.8 mg/mL load via infusion 150 mg (150 mg Intravenous Bolus from Bag 09/18/23 1819)     Clinical Course as of 09/18/23 2024  Wed Sep 18, 2023  1755 Discussed with cardiology.  Does not recommend cardioversion here in the emergency department.  Recommends restarting amiodarone , medicine admission for [BH]  1910 Dr. Sundil with medicine for admission [BH]    Clinical Course User Index [BH] Mikaele Stecher A, PA-C   68 year old multiple medical comorbidities, A-fib on Eliquis  with recurrent episodes of A-fib  over the last 2 weeks, hyperthyroidism here for evaluation of dizziness which is consistent with his prior A-fib episodes.  2 hospital admissions over the last 2 weeks.  I reviewed his records [facility].  Attempted cardioversion x 3 without conversion into sinus rhythm.  He was started on amiodarone  both times despite his known hyperthyroidism.  Was recently seen by cardiology in the outpatient setting started on Multaq and increase his metoprolol  to 50 twice daily.  He comes in today due to persistent A-fib.  States he feels otherwise well.  He has no hypotension.  Does not appear grossly fluid overloaded.  He is compliant with his Eliquis  with no missed doses.   CONSULT with cardiology.  Does not recommend cardioversion here in the emergency department.  Recommends restarting amiodarone , medicine admission.  I reassessed patient at bedside with friend in room.  He is agreeable with admission and restart of amiodarone .  Labs and imaging personally viewed and interpreted:  EKG flutter with RVR Chest x-ray without significant abnormality CBC without leukocytosis BMP without significant Mag 1.9 Trop 24-similar to prior TSH <0.010  Patient reassessed.  We discussed labs and imaging.  He is agreeable for admission.  Cardiology to consult with medicine admission.  Discussed with Dr. Sundil with medicine who is agreeable to evaluate patient for admission.  Patient reassessed.  Still in A-fib with RVR.  On amiodarone  infusion after bolus  The patient appears  reasonably stabilized for admission considering the current resources, flow, and capabilities available in the ED at this time, and I doubt any other [facility] requiring further screening and/or treatment in the ED prior to admission.                                  Medical Decision Making Amount and/or Complexity of Data Reviewed Independent Historian: EMS External Data Reviewed: labs, radiology, ECG and notes. Labs: ordered. Decision-making details documented in ED Course. Radiology: ordered and independent interpretation performed. Decision-making details documented in ED Course. ECG/medicine tests: ordered and independent interpretation performed. Decision-making details documented in ED Course.  Risk OTC drugs. Prescription drug management. Parenteral controlled substances. Decision regarding hospitalization. Diagnosis or treatment significantly limited by social determinants of health.        Final diagnoses:  Atrial fibrillation with rapid ventricular response [facility])  Chronic anticoagulation  Hyperthyroidism    ED Discharge Orders     None          Masato Pettie A, PA-C 09/18/23 2024    Tegeler, Marine Sia, MD 09/19/23 0001

## 2023-09-18 NOTE — H&P (Addendum)
 History and Physical    Tommy Rowe FMW:[id number] DOB: [birth year] DOA: 09/18/2023  PCP: Darra Hamilton, PA-C   Patient coming from: Home   Chief Complaint:  Chief Complaint  Patient presents with   Dizziness   Irregular Heart Beat   ED TRIAGE note:Pt reports he believes he is in AFIB. He reports hx of and states he can tell when he is in it because he feels dizzy. Denies CP/heart palpitations. He has an ablation scheduled at [facility] on 7/30. HR 143 in triage   HPI:  Tommy Rowe is a 68 y.o. male with medical history significant of recurrent episodes of A-fib RVR failed multiple cardioversion, paroxysmal atrial fibrillation on Eliquis  status post recent cardioversion in June 2025, new onset of hyperthyroidism, chronic combined systolic and diastolic heart failure, CAD status post PCI with DES to LAD in June 2023, essential hypertension, hyperlipidemia, left bundle-branch block who present emergency department complaining of atrial fibrillation with associated dizziness.  Patient reported he has been scheduled for ablation at [facility] on 10/30/2023.  Patient denies any chest pain, chest pressure however he is complaining about dizziness with palpitation.  No other complaint at this time.  Patient has recent hospitalization discharged on 09/09/2023 with similar presentation during that time patient developed A-fib RVR with failed cardioversion requiring amiodarone  drip cardiology recommended amiodarone  given limited option in setting of left bundle branch block.  After treated with amiodarone  drip patient converted to sinus rhythm eventually and cardiology arranged EP evaluation outpatient and recommended Eliquis  and metoprolol  on discharge.   ED Course:  At presentation to ED patient found to have A-fib RVR heart rate 144 which has been improved to 103 blood pressure borderline soft otherwise hemodynamically stable. CBC unremarkable. BMP unremarkable.  Normal mag level. Elevated troponin  24. TSH and elevated T4.  EKG showed wide-complex atrial flutter with RVR.  In the ED patient has been given amiodarone  bolus 150 mg and being started on amiodarone  drip.  ED physician spoke with on-call cardiology recommended to start amiodarone  bolus and drip and will evaluate patient soon.  Hospitalist has been consulted for further evaluation and management of recurrent episodes of A-fib RVR.   Significant labs in the ED: Lab Orders         CBC with Differential         Basic metabolic panel         Magnesium          TSH         T4, free         Comprehensive metabolic panel         CBC         Magnesium          APTT         Heparin  level (unfractionated)         Heparin  level (unfractionated)         APTT       Review of Systems:  Review of Systems  Constitutional:  Negative for chills, fever, malaise/fatigue and weight loss.  Respiratory:  Negative for cough, sputum production and shortness of breath.   Cardiovascular:  Positive for palpitations. Negative for chest pain, orthopnea and leg swelling.  Gastrointestinal:  Negative for abdominal pain, heartburn, nausea and vomiting.  Musculoskeletal:  Negative for myalgias.  Neurological:  Positive for dizziness. Negative for tremors, weakness and headaches.  Psychiatric/Behavioral:  Negative for depression. The patient is not nervous/anxious.     Past Medical History:  Diagnosis  Date   A-fib (HCC)    Anxiety    HLD (hyperlipidemia)    HTN (hypertension)     Past Surgical History:  Procedure Laterality Date   CORONARY STENT INTERVENTION N/A 09/18/2021   Procedure: CORONARY STENT INTERVENTION;  Surgeon: Florencio Cara BIRCH, MD;  Location: [facility] INVASIVE CV LAB;  Service: Cardiovascular;  Laterality: N/A;   FACIAL FRACTURE SURGERY  1980   LEFT HEART CATH AND CORONARY ANGIOGRAPHY Left 04/11/2021   Procedure: LEFT HEART CATH AND CORONARY ANGIOGRAPHY;  Surgeon: Fernand Denyse LABOR, MD;  Location: [facility] INVASIVE CV LAB;   Service: Cardiovascular;  Laterality: Left;   LEFT HEART CATH AND CORONARY ANGIOGRAPHY N/A 09/18/2021   Procedure: LEFT HEART CATH AND CORONARY ANGIOGRAPHY;  Surgeon: Florencio Cara BIRCH, MD;  Location: [facility] INVASIVE CV LAB;  Service: Cardiovascular;  Laterality: N/A;   LUMBAR LAMINECTOMY/DECOMPRESSION MICRODISCECTOMY N/A 08/26/2019   Procedure: RIGHT L4-5 MICRODISCECTOMY;  Surgeon: Clois Fret, MD;  Location: [facility] ORS;  Service: Neurosurgery;  Laterality: N/A;     reports that he quit smoking about 35 years ago. His smoking use included cigarettes. He has been exposed to tobacco smoke. He has never used smokeless tobacco. He reports that he does not drink alcohol and does not use drugs.  No Known Allergies  Family History  Problem Relation Age of Onset   COPD Mother    Heart attack Father    Valvular heart disease Sister     Prior to Admission medications   Medication Sig Start Date End Date Taking? Authorizing Provider  dronedarone  (MULTAQ ) 400 MG tablet Take 400 mg by mouth 2 (two) times daily with a meal. 09/13/23 09/12/24  [provider]  ELIQUIS  5 MG TABS tablet Take 5 mg by mouth 2 (two) times daily. 08/17/21   [provider]  escitalopram  (LEXAPRO ) 20 MG tablet Take 20 mg by mouth at bedtime. 11/05/16   [provider]  methimazole  (TAPAZOLE ) 5 MG tablet Take 1 tablet (5 mg total) by mouth 3 (three) times daily. 09/05/23 10/05/23  Maree Hue, MD  metoprolol  tartrate (LOPRESSOR ) 25 MG tablet Take 0.5 tablets (12.5 mg total) by mouth 2 (two) times daily. 09/05/23 10/05/23  Maree Hue, MD  metoprolol  tartrate (LOPRESSOR ) 50 MG tablet Take 50 mg by mouth 2 (two) times daily. 09/17/23 09/16/24  [provider]  Multiple Vitamin (MULTIVITAMIN WITH MINERALS) TABS tablet Take 1 tablet by mouth daily. Centrum Silver for Men 50+    [provider]  rosuvastatin  (CRESTOR ) 20 MG tablet Take 20 mg by mouth at bedtime.    [provider]   sildenafil  (VIAGRA ) 50 MG tablet Take 1 tablet (50 mg total) by mouth daily as needed for erectile dysfunction (take on empty stomach 30 minutes prior to sexual activity). 08/29/23   Francisca Redell BROCKS, MD  spironolactone  (ALDACTONE ) 25 MG tablet Take 12.5 mg by mouth daily. 07/16/23   [provider]  tamsulosin  (FLOMAX ) 0.4 MG CAPS capsule Take 1 capsule (0.4 mg total) by mouth daily. 08/29/23   Francisca Redell BROCKS, MD     Physical Exam: Vitals:   09/19/23 0315 09/19/23 0330 09/19/23 0345 09/19/23 0400  BP: 113/68 115/70 106/62 123/76  Pulse: (!) 50 (!) 51 (!) 51 (!) 56  Resp: 17 16 16 14   Temp:      TempSrc:      SpO2: 93% 94% 96% 93%  Weight:      Height:        Physical Exam Vitals and nursing note  reviewed.  Constitutional:      General: He is not in acute distress.    Appearance: He is not ill-appearing.  HENT:     Mouth/Throat:     Mouth: Mucous membranes are moist.   Eyes:     Pupils: Pupils are equal, round, and reactive to light.    Cardiovascular:     Rate and Rhythm: Tachycardia present. Rhythm irregular.     Heart sounds: No murmur heard. Pulmonary:     Effort: Pulmonary effort is normal.     Breath sounds: Normal breath sounds.  Abdominal:     Palpations: Abdomen is soft.   Musculoskeletal:     Cervical back: Neck supple.     Right lower leg: No edema.     Left lower leg: No edema.   Skin:    General: Skin is warm.     Capillary Refill: Capillary refill takes less than 2 seconds.   Neurological:     Mental Status: He is alert and oriented to person, place, and time.   Psychiatric:        Mood and Affect: Mood normal.      Labs on Admission: I have personally reviewed following labs and imaging studies  CBC: Recent Labs  Lab 09/18/23 1700 09/19/23 0437  WBC 4.5 4.2  NEUTROABS 2.5  --   HGB 14.3 13.7  HCT 41.0 40.1  MCV 92.8 92.6  PLT 134* 123*   Basic Metabolic Panel: Recent Labs  Lab 09/18/23 1700 09/19/23 0437  NA 138 136   K 4.3 4.2  CL 103 105  CO2 23 26  GLUCOSE 114* 116*  BUN 19 17  CREATININE 0.94 0.81  CALCIUM  9.3 8.4*  MG 1.9 2.5*   GFR: Estimated Creatinine Clearance: 98.5 mL/min (by C-G formula based on SCr of 0.81 mg/dL). Liver Function Tests: Recent Labs  Lab 09/19/23 0437  AST 19  ALT 21  ALKPHOS 46  BILITOT 1.2  PROT 6.4*  ALBUMIN 3.5   No results for input(s): LIPASE, AMYLASE in the last 168 hours. No results for input(s): AMMONIA in the last 168 hours. Coagulation Profile: No results for input(s): INR, PROTIME in the last 168 hours. Cardiac Enzymes: Recent Labs  Lab 09/18/23 1700 09/18/23 1954  TROPONINIHS 24* 30*   BNP (last 3 results) Recent Labs    09/04/23 0727  BNP 154.9*   HbA1C: No results for input(s): HGBA1C in the last 72 hours. CBG: No results for input(s): GLUCAP in the last 168 hours. Lipid Profile: No results for input(s): CHOL, HDL, LDLCALC, TRIG, CHOLHDL, LDLDIRECT in the last 72 hours. Thyroid  Function Tests: Recent Labs    09/18/23 1700  TSH <0.010*  FREET4 2.85*   Anemia Panel: No results for input(s): VITAMINB12, FOLATE, FERRITIN, TIBC, IRON, RETICCTPCT in the last 72 hours. Urine analysis:    Component Value Date/Time   COLORURINE RED (A) 02/25/2022 0632   APPEARANCEUR CLOUDY (A) 02/25/2022 0632   LABSPEC 1.025 02/25/2022 0632   PHURINE  02/25/2022 0632    TEST NOT REPORTED DUE TO COLOR INTERFERENCE OF URINE PIGMENT   GLUCOSEU (A) 02/25/2022 0632    TEST NOT REPORTED DUE TO COLOR INTERFERENCE OF URINE PIGMENT   HGBUR (A) 02/25/2022 0632    TEST NOT REPORTED DUE TO COLOR INTERFERENCE OF URINE PIGMENT   BILIRUBINUR (A) 02/25/2022 0632    TEST NOT REPORTED DUE TO COLOR INTERFERENCE OF URINE PIGMENT   KETONESUR (A) 02/25/2022 9367    TEST NOT REPORTED DUE  TO COLOR INTERFERENCE OF URINE PIGMENT   PROTEINUR (A) 02/25/2022 0632    TEST NOT REPORTED DUE TO COLOR INTERFERENCE OF URINE PIGMENT    NITRITE (A) 02/25/2022 0632    TEST NOT REPORTED DUE TO COLOR INTERFERENCE OF URINE PIGMENT   LEUKOCYTESUR (A) 02/25/2022 0632    TEST NOT REPORTED DUE TO COLOR INTERFERENCE OF URINE PIGMENT    Radiological Exams on Admission: I have personally reviewed images DG Chest Portable 1 View Result Date: 09/18/2023 CLINICAL DATA:  sob EXAM: PORTABLE CHEST 1 VIEW COMPARISON:  Chest x-ray 09/08/2023 FINDINGS: The heart and mediastinal contours are within normal limits. No focal consolidation. No pulmonary edema. No pleural effusion. No pneumothorax. No acute osseous abnormality. IMPRESSION: No active disease. Electronically Signed   By: Morgane  Naveau M.D.   On: 09/18/2023 18:31     EKG: My personal interpretation of EKG shows: A-fib RVR heart rate 143.    Assessment/Plan: Principal Problem:   Paroxysmal atrial fibrillation with RVR (HCC) Active Problems:   Hyperthyroidism   Hyperlipidemia   Essential hypertension   Chronic combined systolic and diastolic CHF (congestive heart failure) (HCC)   History of CAD (coronary artery disease)   LBBB (left bundle branch block)   GAD (generalized anxiety disorder)   Insomnia   Elevated troponin    Assessment and Plan: Paroxysmal fibrillation with RVR History of left bundle branch block -Patient presenting to the emergency department complaining dizziness and palpitation.  In the workup revealed A-fib RVR heart rate 143. - At presentation to ED patient is hemodynamically stable blood pressure borderline soft.  Initial heart rate is 143 has been improved to 103 after amiodarone  bolus and drip initiation. - CBC and BMP unremarkable.  Mag within normal range. - Patient has multiple hospital admission for A-fib RVR failed cardioversion and supposed to have an EP outpatient vs patient self-reported appointment at [facility] for ablation 10/30/2023. -Even though patient has recently diagnosed with hyperparathyroidism which could be secondary from side effect of  amiodarone  at this time cardiology has been recommended to start amiodarone  drip and will evaluate patient soon. - In the ED patient has been given amiodarone  bolus followed by amiodarone  drip. -Due to soft blood pressure instead of Lopressor  50 mg twice daily will continue Lopressor  12.5 mg twice daily. -Continue Eliquis  5 mg twice daily. -Obtain echocardiogram.  Last echo in 09/19/2021. -Continue to trend electrolyte and repeat EKG in the morning. Addendum - Since midnight around 12:30 AM patient has been developed sinus bradycardia and converted to normal sinus rhythm.  Amiodarone  drip on hold. - Also Eliquis  on hold and cardiology consulted pharmacy for IV heparin  drip.   Elevated troponin-secondary to demand ischemia - Elevated troponin 24.  Patient denies any chest pain and chest pressure however he is complaining about feeling sensation of palpitation. - EKG showing A-fib RVR.  Elevated troponin in the setting of demand ischemia.  At this point-no concern for acute coronary syndrome.  New diagnosis of hyperthyroidism -Elevated T4 and low TSH.  Patient being recently started on methimazole  2 weeks ago.  Continue methimazole  5 mg 3 times daily  Hyperlipidemia -Continue Crestor   Chronic combined systolic and diastolic heart failure reduced EF 45 to 50% Essential hypertension Left bundle branch block -Due to soft blood pressure at the baseline limited GDMT adding medication.  Continue metoprolol  and spironolactone .  History of CAD s/p DES stent - Continue Crestor , metoprolol  and Eliquis   Generalized anxiety disorder -Continue Lexapro .  Insomnia -Continue Unisom  at bedtime  BPH -  Continue Flomax .  DVT prophylaxis:  Eliquis  Code Status:  Full Code Diet: Heart healthy diet. Family Communication:   Family was present at bedside, at the time of interview. Opportunity was given to ask question and all questions were answered satisfactorily.  Disposition Plan: Continue monitor  improvement of heart rate.  Obtaining echocardiogram. Consults: Cardiology. Admission status:   Inpatient, Step Down Unit  Severity of Illness: The appropriate patient status for this patient is INPATIENT. Inpatient status is judged to be reasonable and necessary in order to provide the required intensity of service to ensure the patient's safety. The patient's presenting symptoms, physical exam findings, and initial radiographic and laboratory data in the context of their chronic comorbidities is felt to place them at high risk for further clinical deterioration. Furthermore, it is not anticipated that the patient will be medically stable for discharge from the hospital within 2 midnights of admission.   * I certify that at the point of admission it is my clinical judgment that the patient will require inpatient hospital care spanning beyond 2 midnights from the point of admission due to high intensity of service, high risk for further deterioration and high frequency of surveillance required.DEWAINE    Simran Bomkamp, MD [facility]  How to contact the [facility] Attending or Consulting provider 7A - 7P or covering provider during after hours 7P -7A, for this patient.  Check the care team in [facility] and look for a) attending/consulting [facility] provider listed and b) the [facility] team listed Log into [email or web address] and use [place]'s universal password to access. If you do not have the password, please contact the hospital operator. Locate the [facility] provider you are looking for under [facility] and page to a number that you can be directly reached. If you still have difficulty reaching the provider, please page the [facility] (Director on Call) for the Hospitalists listed on amion for assistance.  09/19/2023, 5:31 AM

## 2023-09-19 ENCOUNTER — Other Ambulatory Visit: Payer: Self-pay

## 2023-09-19 ENCOUNTER — Inpatient Hospital Stay (HOSPITAL_COMMUNITY)

## 2023-09-19 ENCOUNTER — Other Ambulatory Visit: Payer: Self-pay | Admitting: Student

## 2023-09-19 DIAGNOSIS — I4819 Other persistent atrial fibrillation: Secondary | ICD-10-CM | POA: Diagnosis not present

## 2023-09-19 DIAGNOSIS — I480 Paroxysmal atrial fibrillation: Secondary | ICD-10-CM | POA: Diagnosis not present

## 2023-09-19 DIAGNOSIS — I4892 Unspecified atrial flutter: Secondary | ICD-10-CM

## 2023-09-19 DIAGNOSIS — E785 Hyperlipidemia, unspecified: Secondary | ICD-10-CM

## 2023-09-19 DIAGNOSIS — I2510 Atherosclerotic heart disease of native coronary artery without angina pectoris: Secondary | ICD-10-CM

## 2023-09-19 LAB — CBC
HCT: 40.1 % (ref 39.0–52.0)
Hemoglobin: 13.7 g/dL (ref 13.0–17.0)
MCH: 31.6 pg (ref 26.0–34.0)
MCHC: 34.2 g/dL (ref 30.0–36.0)
MCV: 92.6 fL (ref 80.0–100.0)
Platelets: 123 10*3/uL — ABNORMAL LOW (ref 150–400)
RBC: 4.33 MIL/uL (ref 4.22–5.81)
RDW: 12.1 % (ref 11.5–15.5)
WBC: 4.2 10*3/uL (ref 4.0–10.5)
nRBC: 0 % (ref 0.0–0.2)

## 2023-09-19 LAB — COMPREHENSIVE METABOLIC PANEL WITH GFR
ALT: 21 U/L (ref 0–44)
AST: 19 U/L (ref 15–41)
Albumin: 3.5 g/dL (ref 3.5–5.0)
Alkaline Phosphatase: 46 U/L (ref 38–126)
Anion gap: 5 (ref 5–15)
BUN: 17 mg/dL (ref 8–23)
CO2: 26 mmol/L (ref 22–32)
Calcium: 8.4 mg/dL — ABNORMAL LOW (ref 8.9–10.3)
Chloride: 105 mmol/L (ref 98–111)
Creatinine, Ser: 0.81 mg/dL (ref 0.61–1.24)
GFR, Estimated: >60 mL/min (ref >60)
Glucose, Bld: 116 mg/dL — ABNORMAL HIGH (ref 70–99)
Potassium: 4.2 mmol/L (ref 3.5–5.1)
Sodium: 136 mmol/L (ref 135–145)
Total Bilirubin: 1.2 mg/dL (ref 0.0–1.2)
Total Protein: 6.4 g/dL — ABNORMAL LOW (ref 6.5–8.1)

## 2023-09-19 LAB — MAGNESIUM: Magnesium: 2.5 mg/dL — ABNORMAL HIGH (ref 1.7–2.4)

## 2023-09-19 LAB — HEPARIN LEVEL (UNFRACTIONATED): Heparin Unfractionated: 0.92 [IU]/mL — ABNORMAL HIGH (ref 0.30–0.70)

## 2023-09-19 LAB — APTT: aPTT: 46 s — ABNORMAL HIGH (ref 24–36)

## 2023-09-19 MED ORDER — APIXABAN 5 MG PO TABS
5.0000 mg | ORAL_TABLET | Freq: Two times a day (BID) | ORAL | Status: DC
  Administered 2023-09-19: 5 mg via ORAL
  Filled 2023-09-19: qty 1

## 2023-09-19 MED ORDER — SPIRONOLACTONE 25 MG PO TABS: 12.5000 mg | ORAL_TABLET | Freq: Every morning | ORAL | 2 refills | Status: DC

## 2023-09-19 MED ORDER — AMIODARONE HCL 400 MG PO TABS: 400.0000 mg | ORAL_TABLET | Freq: Two times a day (BID) | ORAL | 0 refills | Status: DC

## 2023-09-19 MED ORDER — METOPROLOL TARTRATE 25 MG PO TABS: 12.5000 mg | ORAL_TABLET | Freq: Two times a day (BID) | ORAL | 2 refills | Status: DC

## 2023-09-19 MED ORDER — AMIODARONE HCL 200 MG PO TABS
400.0000 mg | ORAL_TABLET | Freq: Two times a day (BID) | ORAL | Status: DC
  Administered 2023-09-19: 400 mg via ORAL
  Filled 2023-09-19: qty 2

## 2023-09-19 MED ORDER — MULTAQ 400 MG PO TABS: 400.0000 mg | ORAL_TABLET | Freq: Two times a day (BID) | ORAL | 0 refills | Status: DC

## 2023-09-19 NOTE — Consult Note (Addendum)
 ELECTROPHYSIOLOGY CONSULT NOTE    Patient ID: Tommy Rowe MRN: [id number], DOB/AGE: 1955/08/17 68 y.o.  Admit date: 09/18/2023 Date of Consult: 09/19/2023  Primary Physician: Victor Grapes, PA-C Primary Cardiologist: None  Electrophysiologist: New to Dr. Daneil Dunker (previously seen by [facility])   Referring Provider: Dr. Paulita Boss  Patient Profile: Tommy Rowe is a 68 y.o. male with a hx of paroxymal A fib/flutter, chronic combined systolic and diastolic heart failure, coronary artery disease s/p stent to LAD (08/2021), HTN, HLD, bradycardia, LBBB, hyperthyroidism,  who is being seen 09/18/2023 for the evaluation of A fib RVR at the request of Dr Paulita Boss.   HPI:  Tommy Rowe is a 68 y.o. male as above  Per chart review, he follows [facility] Dr Beau Bound for cardiology. He had NSTEMI/ CAD in 08/2021, cardiac cath at outside facility at the time showed 100% mid LAD 2 and 70% mid LAD 1, 75% D2, and 50% dist RCA. He was treated with DES to mid LAD. He was noted with moderate LV systolic dysfunction with EF 35-45%. He has ischemic cardiomyopathy and chronic combined CHF, has been on GDMT with losartan  25mg , crestor  20mg , spironolactone  12.5mg  based on cardiology office visit on 03/07/23 on care everywhere. Echo last repeated on 07/29/22 at [facility] showed LVEF improved to 50-55%, mild to moderate MR, mild  AI and TR.   This is pts third hospital visit for AF RVR in June. His amiodarone  was previously stopped with TSH of 0.017 and free T4 2.21. Seen by Dr. Andy Bannister 6/13 and arranged for AF ablation 7/30.  Echo repeated on [facility] on 09/11/23 showed LVEF 55%, mod LVH, normal diastolic function, normal RV, mild AR, MILD MR, trivial PR, mild TR, RVSP .   He presented again yesterday with AF RVR and decision made to re-initiated amiodarone . IV started.  Pt consulted overnight. With changes in EP schedule today, asked to see in the event we could perform ablation earlier than 7/30.   Pt is feeling OK  at rest currently. Reports fatigue and SOB when out of rhythm. Has converted to NSR overnight on IV amiodarone  and now transitioned to po.  He has only met with [facility] once, and prefers to reschedule his ablation for the soonest date.   Labs Potassium4.2 (06/19 4098) Magnesium  2.5* (06/19 0437) Creatinine, ser  0.81 (06/19 0437) PLT  123* (06/19 0437) HGB  13.7 (06/19 0437) WBC 4.2 (06/19 0437) Troponin I (High Sensitivity)30* (06/18 1954).    Past Medical History:  Diagnosis Date   A-fib (HCC)    Anxiety    HLD (hyperlipidemia)    HTN (hypertension)      Surgical History:  Past Surgical History:  Procedure Laterality Date   CORONARY STENT INTERVENTION N/A 09/18/2021   Procedure: CORONARY STENT INTERVENTION;  Surgeon: Antonette Batters, MD;  Location: [facility] INVASIVE CV LAB;  Service: Cardiovascular;  Laterality: N/A;   FACIAL FRACTURE SURGERY  1980   LEFT HEART CATH AND CORONARY ANGIOGRAPHY Left 04/11/2021   Procedure: LEFT HEART CATH AND CORONARY ANGIOGRAPHY;  Surgeon: Cherrie Cornwall, MD;  Location: [facility] INVASIVE CV LAB;  Service: Cardiovascular;  Laterality: Left;   LEFT HEART CATH AND CORONARY ANGIOGRAPHY N/A 09/18/2021   Procedure: LEFT HEART CATH AND CORONARY ANGIOGRAPHY;  Surgeon: Antonette Batters, MD;  Location: [facility] INVASIVE CV LAB;  Service: Cardiovascular;  Laterality: N/A;   LUMBAR LAMINECTOMY/DECOMPRESSION MICRODISCECTOMY N/A 08/26/2019   Procedure: RIGHT L4-5 MICRODISCECTOMY;  Surgeon: Jodeen Munch, MD;  Location: [facility] ORS;  Service:  Neurosurgery;  Laterality: N/A;     (Not in a hospital admission)   Inpatient Medications:   amiodarone   400 mg Oral BID   apixaban   5 mg Oral BID   docusate sodium  100 mg Oral BID   escitalopram   20 mg Oral QHS   methimazole   5 mg Oral TID   metoprolol  tartrate  12.5 mg Oral BID   rosuvastatin   20 mg Oral QHS   sodium chloride  flush  3 mL Intravenous Q12H   spironolactone   12.5 mg Oral q morning   tamsulosin   0.4 mg Oral  Daily    Allergies: No Known Allergies  Family History  Problem Relation Age of Onset   COPD Mother    Heart attack Father    Valvular heart disease Sister      Physical Exam: Vitals:   09/19/23 0612 09/19/23 0700 09/19/23 0950 09/19/23 1123  BP:  138/81 (!) 155/76 119/73  Pulse:  65 71 61  Resp:  16 17 17   Temp: 98.3 F (36.8 C)   98 F (36.7 C)  TempSrc:    Oral  SpO2:  100% 99% 99%  Weight:      Height:        GEN- NAD, A&O x 3, normal affect HEENT: Normocephalic, atraumatic Lungs- CTAB, Normal effort.  Heart- Regular rate and rhythm, No M/G/R.  GI- Soft, NT, ND.  Extremities- No clubbing, cyanosis, or edema   Radiology/Studies: DG Chest Portable 1 View Result Date: 09/18/2023 CLINICAL DATA:  sob EXAM: PORTABLE CHEST 1 VIEW COMPARISON:  Chest x-ray 09/08/2023 FINDINGS: The heart and mediastinal contours are within normal limits. No focal consolidation. No pulmonary edema. No pleural effusion. No pneumothorax. No acute osseous abnormality. IMPRESSION: No active disease. Electronically Signed   By: Morgane  Naveau M.D.   On: 09/18/2023 18:31   DG Chest Port 1 View Result Date: 09/08/2023 CLINICAL DATA:  Chest pain and tachycardia. EXAM: PORTABLE CHEST 1 VIEW COMPARISON:  Chest x-ray 09/17/2021 FINDINGS: External pacer paddles are noted. The heart is normal in size. The mediastinal and hilar contours are normal. The lungs are clear. No infiltrates, effusions or edema. No pneumothorax. No pulmonary lesions. The bony thorax is intact. IMPRESSION: No acute cardiopulmonary findings. Electronically Signed   By: Marrian Siva M.D.   On: 09/08/2023 16:28    EKG: on arrival showed AF vs AFL in 140s (personally reviewed)  TELEMETRY: SB/NSR 50-60s currently, converted to NSR overnigt (personally reviewed)  Assessment/Plan:  Paroxysmal Atrial fibrillation Atrial flutter, typical EKG on arrival shows rapid flutter.  Previously taken off amiodarone  with thyroid  concerns, had been  on low dose amiodarone  for years.  Scheduled for ablation at [facility] 7/30  Dr. Daneil Dunker will review schedule with staff to see if we can offer him something sooner than that, and will further consider AAD recommendations in the meantime.   Hyperthyroidism Will need this addressed as well, as could potentially lead to more fib down the road, or even be driving his recent exacerbations.   Plan pending Dr. Orinda Birkenhead upcoming availability.   For questions or updates, please contact [facility] Please consult [email or web address] for contact info under     Signed, Tylene Galla, PA-C  09/19/2023, 11:39 AM    I have seen, examined the patient, and reviewed the above assessment and plan.    HPI: Patient presented to ED with symptomatic atrial fibrillation with rapid ventricular rates. He has had several similar presentations this past month, only recently started dronedarone  as bridge to ablation. Dronedarone was held in the ED and he was started on IV amiodarone . He converted to sinus rhythm with this. He was seen after conversion to sinus rhythm and reported feeling well but is concerned about risk of recurrence.  General: Well developed, in no acute distress.  Neck: No JVD.  Cardiac: Normal rate, regular rhythm.  Resp: Normal work of breathing.  Ext: No edema.  Neuro: No gross focal deficits.  Psych: Normal affect.   Assessment and Plan:  Patient has history of paroxysmal AF dating back several years. Had previously been on amiodarone . Highly symptomatic with rapid rates during episodes. He has subsequently developed hyperthyroidism, possibly related to amiodarone  use, but complete Endocrine evaluation has not been performed. His AF predates his hyperthyroidism so unlikely to permanently resolve with treatment of his thyroid  disease. He prefers ablation for more definitive management.   #. Symptomatic paroxysmal atrial fibrillation:  #. Secondary hypercoagulable state due to AF:   - Patient currently has ablation scheduled for July 30th at [facility]. Due to cancellation, we could perform ablation on July 2nd. This possibility was discussed with patient at request of my general cardiology colleagues. Patient has no preference with regards to location but prefers earliest possible date for ablation given that he is highly symptomatic and has had multiple recent ED visit. For now, we will tentatively schedule for July 2nd. I will reach out to his EP at [facility] to see if they have anything sooner.  -Resume dronedarone 400mg  BID as bridge to ablation.  -Continue metoprolol  12.5mg  BID.  -Continue Eliquis  5mg  BID.   #. Hyperthyroidism:  - Avoid amiodarone  for now, opt for ablation instead. Awaiting Endocrinology appointment.   Ardeen Kohler, MD 09/19/2023 9:21 PM

## 2023-09-19 NOTE — ED Notes (Signed)
 Iv heparin  rate adjusted per pharmacy order, confirmed IV patency and infusion continues to run uninterrupted.

## 2023-09-19 NOTE — Progress Notes (Signed)
 PHARMACY - ANTICOAGULATION CONSULT NOTE  Pharmacy Consult for heparin   Indication: atrial fibrillation  No Known Allergies  Patient Measurements: Height: 6' (182.9 cm) Weight: 90.7 kg (200 lb) IBW/kg (Calculated) : 77.6 HEPARIN  DW (KG): 90.7  Vital Signs: Temp: 98.3 F (36.8 C) (06/19 0612) Temp Source: Oral (06/19 0232) BP: 138/81 (06/19 0700) Pulse Rate: 65 (06/19 0700)  Labs: Recent Labs    09/18/23 1700 09/18/23 1954 09/19/23 0437  HGB 14.3  --  13.7  HCT 41.0  --  40.1  PLT 134*  --  123*  APTT  --   --  46*  HEPARINUNFRC  --   --  0.92*  CREATININE 0.94  --  0.81  TROPONINIHS 24* 30*  --     Estimated Creatinine Clearance: 98.5 mL/min (by C-G formula based on SCr of 0.81 mg/dL).   Medical History: Past Medical History:  Diagnosis Date   A-fib (HCC)    Anxiety    HLD (hyperlipidemia)    HTN (hypertension)     Assessment: 36 YOM presented with dizziness and irregular heartbeat. Has a history of paroxymal afib/futter on apixaban  prior to arrival. Was on heparin  but pharmacy consulted to switch back to PTA Eliquis   Goal of Therapy:  Monitor platelets by anticoagulation protocol: Yes   Plan:  Stop heparin  infusion Restart apixaban  5 mg BID for atrial fibrillation Monitor for s/sx of bleeding   Thank you for involving pharmacy in the patient's care.   Barbra Boone, PharmD PGY1 Acute Care Pharmacy Resident  09/19/2023 7:50 AM

## 2023-09-19 NOTE — Discharge Summary (Signed)
 Physician Discharge Summary   Patient: Tommy Rowe MRN: [id number] DOB: [birth year]  Admit date:     09/18/2023  Discharge date: 09/19/23  Discharge Physician: Bobbetta Burnet   PCP: Victor Grapes, PA-C   Recommendations at discharge:   Follow-up with PCP in 1 week Follow-up with cardiologist in 1-2 weeks Follow-up in EP cardiology appointment-as scheduled for July for ambulation Please note change in the medication per cardiology prescriptions have been provided  Discharge Diagnoses: Principal Problem:   Paroxysmal atrial fibrillation with RVR (HCC) Active Problems:   Hyperthyroidism   Hyperlipidemia   Essential hypertension   Chronic combined systolic and diastolic CHF (congestive heart failure) (HCC)   History of CAD (coronary artery disease)   LBBB (left bundle branch block)   GAD (generalized anxiety disorder)   Insomnia   Elevated troponin  Resolved Problems:   * No resolved hospital problems. *  Hospital Course: Tommy Rowe is a 68 y.o. male with medical history significant of recurrent episodes of A-fib RVR failed multiple cardioversion, paroxysmal atrial fibrillation on Eliquis  status post recent cardioversion in June 2025, new onset of hyperthyroidism, chronic combined systolic and diastolic heart failure, CAD status post PCI with DES to LAD in June 2023, essential hypertension, hyperlipidemia, left bundle-branch block who present emergency department complaining of atrial fibrillation with associated dizziness.  Patient reported he has been scheduled for ablation at [facility] on 10/30/2023.   Patient denies any chest pain, chest pressure however he is complaining about dizziness with palpitation.  No other complaint at this time.   Patient has recent hospitalization discharged on 09/09/2023 with similar presentation during that time patient developed A-fib RVR with failed cardioversion requiring amiodarone  drip cardiology recommended amiodarone  given limited option in  setting of left bundle branch block.  After treated with amiodarone  drip patient converted to sinus rhythm eventually and cardiology arranged EP evaluation outpatient and recommended Eliquis  and metoprolol  on discharge.  ED Course:  found to have A-fib RVR heart rate 144 which has been improved to 103 blood pressure borderline soft otherwise hemodynamically stable. CBC unremarkable., BMP unremarkable.  Normal mag level. Elevated troponin 24. TSH low and elevated T4.   EKG showed wide-complex atrial flutter with RVR. Given amiodarone  bolus 150 mg and being started on amiodarone  drip.   ED physician spoke with on-call cardiology recommended to start amiodarone  bolus and drip and will evaluat     Paroxysmal fibrillation with RVR w h/o LBBB - Stable now - POA A-fib with RVR-rate as high as 143 >>> heart rate improved to 67 -Transiently on amiodarone  drip, s/p bolus-  switched to p.o. by cardiology this amiodarone  at 400 mg p.o. twice daily - Continue Eliquis  - continue Lopressor  12.5 mg twice daily. - Cardiology to obtain further cardiac imaging as outpatient - Consulted EP currently plan for ablation later in July.   - Magnesium and potassium optimized cardioversion and supposed to have an EP outpatient vs patient self-reported appointment at [facility] for ablation 10/30/2023. Patient requesting for it to be done here.  - Hyperparathyroidism which could be secondary from side effect of ?? amiodarone  at this time cardiology has been recommended to continue amiodarone     -Obtain echocardiogram.  Last echo in 09/19/2021. -Continue to trend electrolyte and repeat EKG in the morning.   Elevated troponin-secondary to demand ischemia -Demand ischemia, denies any chest pain - Elevated troponin 24, 30    New diagnosis of hyperthyroidism -Elevated T4 and low TSH.  Patient being recently started on methimazole  2  weeks ago.  Continue methimazole  5 mg 3 times daily   Hyperlipidemia -Continue  Crestor    Chronic combined systolic and diastolic heart failure reduced EF 45 to 50% - w LBBB Left bundle branch block -Due to soft blood pressure at the baseline limited GDMT adding medication.  Continue metoprolol  and spironolactone .   History of CAD s/p DES stent Stable  - Continue Crestor , metoprolol  and Eliquis    Generalized anxiety disorder -Continue Lexapro .   Insomnia -Continue Unisom  at bedtime   BPH - Continue Flomax .  Essential hypertension -Stable on current meds, including metoprolol , Aldactone  - Metoprolol  and Aldactone  dose has been reduced by cardiology     Consultants: Cardiology/EP cardiology  Disposition: Home Diet recommendation:  Discharge Diet Orders (From admission, onward)     Start     Ordered   09/19/23 0000  Diet - low sodium heart healthy        09/19/23 1347           Cardiac diet DISCHARGE MEDICATION: Allergies as of 09/19/2023   No Known Allergies      Medication List     STOP taking these medications    dronedarone 400 MG tablet Commonly known as: MULTAQ       TAKE these medications    amiodarone  400 MG tablet Commonly known as: PACERONE  Take 1 tablet (400 mg total) by mouth 2 (two) times daily for 20 days.   desonide 0.05 % ointment Commonly known as: DESOWEN Apply 1 Application topically daily as needed.   doxylamine  (Sleep) 25 MG tablet Commonly known as: UNISOM  Take 12.5 mg by mouth at bedtime as needed for sleep.   Eliquis  5 MG Tabs tablet Generic drug: apixaban  Take 5 mg by mouth 2 (two) times daily.   escitalopram  20 MG tablet Commonly known as: LEXAPRO  Take 20 mg by mouth at bedtime.   methimazole  5 MG tablet Commonly known as: TAPAZOLE  Take 1 tablet (5 mg total) by mouth 3 (three) times daily.   metoprolol  tartrate 25 MG tablet Commonly known as: LOPRESSOR  Take 0.5 tablets (12.5 mg total) by mouth 2 (two) times daily. What changed: Another medication with the same name was removed. Continue  taking this medication, and follow the directions you see here.   multivitamin with minerals Tabs tablet Take 1 tablet by mouth every morning. Centrum Silver for Men 50+   rosuvastatin  20 MG tablet Commonly known as: CRESTOR  Take 20 mg by mouth at bedtime.   spironolactone  25 MG tablet Commonly known as: ALDACTONE  Take 0.5 tablets (12.5 mg total) by mouth every morning. Start taking on: September 20, 2023   tamsulosin  0.4 MG Caps capsule Commonly known as: FLOMAX  Take 1 capsule (0.4 mg total) by mouth daily. What changed: when to take this   triamcinolone cream 0.1 % Commonly known as: KENALOG Apply 1 Application topically daily as needed.        Discharge Exam: Filed Weights   09/18/23 1656  Weight: 90.7 kg        General:  AAO x 3,  cooperative, no distress;   HEENT:  Normocephalic, PERRL, otherwise with in Normal limits   Neuro:  CNII-XII intact. , normal motor and sensation, reflexes intact   Lungs:   Clear to auscultation BL, Respirations unlabored,  No wheezes / crackles  Cardio:    S1/S2, RRR, No murmure, No Rubs or Gallops   Abdomen:  Soft, non-tender, bowel sounds active all four quadrants, no guarding or peritoneal signs.  Muscular  skeletal:  Limited exam -global generalized weaknesses - in bed, able to move all 4 extremities,   2+ pulses,  symmetric, No pitting edema  Skin:  Dry, warm to touch, negative for any Rashes,  Wounds: Please see nursing documentation          Condition at discharge: good  The results of significant diagnostics from this hospitalization (including imaging, microbiology, ancillary and laboratory) are listed below for reference.   Imaging Studies: DG Chest Portable 1 View Result Date: 09/18/2023 CLINICAL DATA:  sob EXAM: PORTABLE CHEST 1 VIEW COMPARISON:  Chest x-ray 09/08/2023 FINDINGS: The heart and mediastinal contours are within normal limits. No focal consolidation. No pulmonary edema. No pleural effusion. No  pneumothorax. No acute osseous abnormality. IMPRESSION: No active disease. Electronically Signed   By: Morgane  Naveau M.D.   On: 09/18/2023 18:31   DG Chest Port 1 View Result Date: 09/08/2023 CLINICAL DATA:  Chest pain and tachycardia. EXAM: PORTABLE CHEST 1 VIEW COMPARISON:  Chest x-ray 09/17/2021 FINDINGS: External pacer paddles are noted. The heart is normal in size. The mediastinal and hilar contours are normal. The lungs are clear. No infiltrates, effusions or edema. No pneumothorax. No pulmonary lesions. The bony thorax is intact. IMPRESSION: No acute cardiopulmonary findings. Electronically Signed   By: Marrian Siva M.D.   On: 09/08/2023 16:28    Microbiology: Results for orders placed or performed during the hospital encounter of 09/04/23  MRSA Next Gen by PCR, Nasal     Status: None   Collection Time: 09/04/23  9:43 AM   Specimen: Nasal Mucosa; Nasal Swab  Result Value Ref Range Status   MRSA by PCR Next Gen NOT DETECTED NOT DETECTED Final    Comment: (NOTE) The GeneXpert MRSA Assay (FDA approved for NASAL specimens only), is one component of a comprehensive MRSA colonization surveillance program. It is not intended to diagnose MRSA infection nor to guide or monitor treatment for MRSA infections. Test performance is not FDA approved in patients less than 67 years old. Performed at [facility], [address] [place], [place] [address]     Labs: CBC: Recent Labs  Lab 09/18/23 1700 09/19/23 0437  WBC 4.5 4.2  NEUTROABS 2.5  --   HGB 14.3 13.7  HCT 41.0 40.1  MCV 92.8 92.6  PLT 134* 123*   Basic Metabolic Panel: Recent Labs  Lab 09/18/23 1700 09/19/23 0437  NA 138 136  K 4.3 4.2  CL 103 105  CO2 23 26  GLUCOSE 114* 116*  BUN 19 17  CREATININE 0.94 0.81  CALCIUM  9.3 8.4*  MG 1.9 2.5*   Liver Function Tests: Recent Labs  Lab 09/19/23 0437  AST 19  ALT 21  ALKPHOS 46  BILITOT 1.2  PROT 6.4*  ALBUMIN 3.5   CBG: No results for input(s):  GLUCAP in the last 168 hours.  Discharge time spent: greater than 30 minutes.  Signed: Bobbetta Burnet, MD [facility] 09/19/2023

## 2023-09-19 NOTE — Progress Notes (Signed)
 PROGRESS NOTE    Patient: Tommy Rowe                            PCP: Victor Grapes, PA-C                    DOB: Jan 01, 1956            DOA: 09/18/2023 WJX:[id number]             DOS: 09/19/2023, 1:43 PM   LOS: 1 day   Date of Service: The patient was seen and examined on 09/19/2023  Subjective:   The patient was seen and examined this morning. Hemodynamically stable. Denies any chest pain - Heart rate has improved amiodarone  drip switched to p.o. by cardiology  Brief Narrative:   Tommy Rowe is a 68 y.o. male with medical history significant of recurrent episodes of A-fib RVR failed multiple cardioversion, paroxysmal atrial fibrillation on Eliquis  status post recent cardioversion in June 2025, new onset of hyperthyroidism, chronic combined systolic and diastolic heart failure, CAD status post PCI with DES to LAD in June 2023, essential hypertension, hyperlipidemia, left bundle-branch block who present emergency department complaining of atrial fibrillation with associated dizziness.  Patient reported he has been scheduled for ablation at [facility] on 10/30/2023.   Patient denies any chest pain, chest pressure however he is complaining about dizziness with palpitation.  No other complaint at this time.   Patient has recent hospitalization discharged on 09/09/2023 with similar presentation during that time patient developed A-fib RVR with failed cardioversion requiring amiodarone  drip cardiology recommended amiodarone  given limited option in setting of left bundle branch block.  After treated with amiodarone  drip patient converted to sinus rhythm eventually and cardiology arranged EP evaluation outpatient and recommended Eliquis  and metoprolol  on discharge.  ED Course:  found to have A-fib RVR heart rate 144 which has been improved to 103 blood pressure borderline soft otherwise hemodynamically stable. CBC unremarkable., BMP unremarkable.  Normal mag level. Elevated troponin 24. TSH low and  elevated T4.   EKG showed wide-complex atrial flutter with RVR. Given amiodarone  bolus 150 mg and being started on amiodarone  drip.   ED physician spoke with on-call cardiology recommended to start amiodarone  bolus and drip and will evaluat    Assessment & Plan:   Principal Problem:   Paroxysmal atrial fibrillation with RVR (HCC) Active Problems:   Hyperthyroidism   Hyperlipidemia   Essential hypertension   Chronic combined systolic and diastolic CHF (congestive heart failure) (HCC)   History of CAD (coronary artery disease)   LBBB (left bundle branch block)   GAD (generalized anxiety disorder)   Insomnia   Elevated troponin    Assessment and Plan:  Paroxysmal fibrillation with RVR History of left bundle branch block - POA A-fib with RVR-rate as high as 143 -Transiently on amiodarone  drip, s/p bolus-  switched to p.o. by cardiology this amiodarone  at 400 mg p.o. twice daily - Continue Eliquis  - continue Lopressor  12.5 mg twice daily. - Cardiology obtain further cardiac imaging  - Consulted EP currently plan for ablation later in July.   - Magnesium and potassium optimized cardioversion and supposed to have an EP outpatient vs patient self-reported appointment at [facility] for ablation 10/30/2023. Patient requesting for it to be done here.  - Hyperparathyroidism which could be secondary from side effect of ?? amiodarone  at this time cardiology has been recommended to continue amiodarone     -Obtain echocardiogram.  Last echo in 09/19/2021. -Continue to trend electrolyte and repeat EKG in the morning.    Elevated troponin-secondary to demand ischemia -Demand ischemia, denies any chest pain - Elevated troponin 24, 30    New diagnosis of hyperthyroidism -Elevated T4 and low TSH.  Patient being recently started on methimazole  2 weeks ago.  Continue methimazole  5 mg 3 times daily   Hyperlipidemia -Continue Crestor    Chronic combined systolic and diastolic heart failure  reduced EF 45 to 50% - w LBBB Left bundle branch block -Due to soft blood pressure at the baseline limited GDMT adding medication.  Continue metoprolol  and spironolactone .   History of CAD s/p DES stent Stable  - Continue Crestor , metoprolol  and Eliquis    Generalized anxiety disorder -Continue Lexapro .   Insomnia -Continue Unisom  at bedtime   BPH - Continue Flomax .  Essential hypertension -Stable on current meds, including metoprolol , Aldactone        Nutritional status:  The patient's BMI is: Body mass index is 27.12 kg/m. I agree with the assessment and plan as outlined  -----------------------------------------------------------------------------------------------------------------  DVT prophylaxis:  SCDs Start: 09/18/23 1917 Place TED hose Start: 09/18/23 1917 apixaban  (ELIQUIS ) tablet 5 mg   Code Status:   Code Status: Full Code  Family Communication: No family member present at bedside- attempt will be made to update daily The above findings and plan of care has been discussed with patient (and family)  in detail,  they expressed understanding and agreement of above. -Advance care planning has been discussed.   Admission status:   Status is: Inpatient Remains inpatient appropriate because: On amiodarone  drip, cardiology and EP evaluation   Disposition: From  - home             Planning for discharge in 1-days: to home   Procedures:   No admission procedures for hospital encounter.   Antimicrobials:  Anti-infectives (From admission, onward)    None        Medication:   amiodarone   400 mg Oral BID   apixaban   5 mg Oral BID   docusate sodium  100 mg Oral BID   escitalopram   20 mg Oral QHS   methimazole   5 mg Oral TID   metoprolol  tartrate  12.5 mg Oral BID   rosuvastatin   20 mg Oral QHS   sodium chloride  flush  3 mL Intravenous Q12H   spironolactone   12.5 mg Oral q morning   tamsulosin   0.4 mg Oral Daily    acetaminophen  **OR**  acetaminophen , doxylamine  (Sleep), ondansetron  **OR** ondansetron  (ZOFRAN ) IV   Objective:   Vitals:   09/19/23 1130 09/19/23 1145 09/19/23 1200 09/19/23 1215  BP: 123/84 124/72 126/70 116/68  Pulse: 64 68 63 62  Resp: (!) 24 (!) 25 12 17   Temp:      TempSrc:      SpO2: 96% 100% 98% 99%  Weight:      Height:       No intake or output data in the 24 hours ending 09/19/23 1343 Filed Weights   09/18/23 1656  Weight: 90.7 kg     Physical examination:   Constitution:  Alert, cooperative, no distress,  Appears calm and comfortable  Psychiatric:   Normal and stable mood and affect, cognition intact,   HEENT:        Normocephalic, PERRL, otherwise with in Normal limits  Chest:         Chest symmetric Cardio vascular: Irregularly irregular, S1/S2, No murmure, No Rubs or Gallops  pulmonary: Clear to  auscultation bilaterally, respirations unlabored, negative wheezes / crackles Abdomen: Soft, non-tender, non-distended, bowel sounds,no masses, no organomegaly Muscular skeletal: Limited exam - in bed, able to move all 4 extremities,   Neuro: CNII-XII intact. , normal motor and sensation, reflexes intact  Extremities: No pitting edema lower extremities, +2 pulses  Skin: Dry, warm to touch, negative for any Rashes, No open wounds Wounds: per nursing documentation   ------------------------------------------------------------------------------------------------------------------------------------------    LABs:     Latest Ref Rng & Units 09/19/2023    4:37 AM 09/18/2023    5:00 PM 09/09/2023    3:59 AM  CBC  WBC 4.0 - 10.5 K/uL 4.2  4.5  5.1   Hemoglobin 13.0 - 17.0 g/dL 78.4  69.6  29.5   Hematocrit 39.0 - 52.0 % 40.1  41.0  40.3   Platelets 150 - 400 K/uL 123  134  137       Latest Ref Rng & Units 09/19/2023    4:37 AM 09/18/2023    5:00 PM 09/09/2023    3:59 AM  CMP  Glucose 70 - 99 mg/dL 284  132  97   BUN 8 - 23 mg/dL 17  19  25    Creatinine 0.61 - 1.24 mg/dL 4.40  1.02   7.25   Sodium 135 - 145 mmol/L 136  138  139   Potassium 3.5 - 5.1 mmol/L 4.2  4.3  4.1   Chloride 98 - 111 mmol/L 105  103  106   CO2 22 - 32 mmol/L 26  23  25    Calcium  8.9 - 10.3 mg/dL 8.4  9.3  8.7   Total Protein 6.5 - 8.1 g/dL 6.4     Total Bilirubin 0.0 - 1.2 mg/dL 1.2     Alkaline Phos 38 - 126 U/L 46     AST 15 - 41 U/L 19     ALT 0 - 44 U/L 21          Micro Results No results found for this or any previous visit (from the past 240 hours).  Radiology Reports DG Chest Portable 1 View Result Date: 09/18/2023 CLINICAL DATA:  sob EXAM: PORTABLE CHEST 1 VIEW COMPARISON:  Chest x-ray 09/08/2023 FINDINGS: The heart and mediastinal contours are within normal limits. No focal consolidation. No pulmonary edema. No pleural effusion. No pneumothorax. No acute osseous abnormality. IMPRESSION: No active disease. Electronically Signed   By: Morgane  Naveau M.D.   On: 09/18/2023 18:31    SIGNED: Bobbetta Burnet, MD, FHM. FAAFP. Arlin Benes - [facility] Time spent - 55 min.  In seeing, evaluating and examining the patient. Reviewing medical records, labs, drawn plan of care. [facility],  Pager (please use [email or web address] to page/ text) Please use Epic Secure Chat for non-urgent communication (7AM-7PM)  If 7PM-7AM, please contact night-coverage [email or web address], 09/19/2023, 1:43 PM

## 2023-09-19 NOTE — Progress Notes (Addendum)
 RN reported heart rate dropped to 50.  Patient is back and forth in between sinus bradycardia and atrial fibrillation heart rate of 115.  EKG shows sinus bradycardia heart rate 56.   Holding amiodarone  drip at 12:30 AM 09/19/2023 in the setting of sinus bradycardia.

## 2023-09-19 NOTE — Progress Notes (Addendum)
 PHARMACY - ANTICOAGULATION CONSULT NOTE  Pharmacy Consult for heparin   Indication: atrial fibrillation  No Known Allergies  Patient Measurements: Height: 6' (182.9 cm) Weight: 90.7 kg (200 lb) IBW/kg (Calculated) : 77.6 HEPARIN  DW (KG): 90.7  Vital Signs: Temp: 97.5 F (36.4 C) (06/19 0232) Temp Source: Oral (06/19 0232) BP: 123/76 (06/19 0400) Pulse Rate: 56 (06/19 0400)  Labs: Recent Labs    09/18/23 1700 09/18/23 1954 09/19/23 0437  HGB 14.3  --  13.7  HCT 41.0  --  40.1  PLT 134*  --  123*  APTT  --   --  46*  CREATININE 0.94  --   --   TROPONINIHS 24* 30*  --     Estimated Creatinine Clearance: 84.8 mL/min (by C-G formula based on SCr of 0.94 mg/dL).   Medical History: Past Medical History:  Diagnosis Date   A-fib (HCC)    Anxiety    HLD (hyperlipidemia)    HTN (hypertension)     Assessment: 62 YOM presented with dizziness and irregular heartbeat. Has a history of paroxymal afib/futter on apixaban  prior to arrival. Last dose 6/18 at 7am. Will require aPTT monitoring due to likely falsely high anti-Xa level secondary to DOAC use. Pharmacy consulted for heparin  dosing for possible intervention.   AM: heparin  level falsely elevated due to recent DOAC use and aPTT below goal on 1250 units/hr. Per RN, no issues with infusion running continuously or signs/symptoms of bleeding.  Goal of Therapy:  Heparin  level 0.3-0.7 units/ml Monitor platelets by anticoagulation protocol: Yes   Plan:  Increase heparin  infusion to 1600 units/hr with no bolus Check aPTT in 6 hours and daily while on heparin  Continue to monitor via aPTT until levels are correlated Monitor daily heparin  level and CBC F/u ability to transition back to Eliquis   Young Hensen, PharmD, BCPS Clinical Pharmacist 09/19/2023 5:09 AM

## 2023-09-19 NOTE — Hospital Course (Addendum)
 Tommy Rowe is a 68 y.o. male with medical history significant of recurrent episodes of A-fib RVR failed multiple cardioversion, paroxysmal atrial fibrillation on Eliquis  status post recent cardioversion in June 2025, new onset of hyperthyroidism, chronic combined systolic and diastolic heart failure, CAD status post PCI with DES to LAD in June 2023, essential hypertension, hyperlipidemia, left bundle-branch block who present emergency department complaining of atrial fibrillation with associated dizziness.  Patient reported he has been scheduled for ablation at [facility] on 10/30/2023.   Patient denies any chest pain, chest pressure however he is complaining about dizziness with palpitation.  No other complaint at this time.   Patient has recent hospitalization discharged on 09/09/2023 with similar presentation during that time patient developed A-fib RVR with failed cardioversion requiring amiodarone  drip cardiology recommended amiodarone  given limited option in setting of left bundle branch block.  After treated with amiodarone  drip patient converted to sinus rhythm eventually and cardiology arranged EP evaluation outpatient and recommended Eliquis  and metoprolol  on discharge.  ED Course:  found to have A-fib RVR heart rate 144 which has been improved to 103 blood pressure borderline soft otherwise hemodynamically stable. CBC unremarkable., BMP unremarkable.  Normal mag level. Elevated troponin 24. TSH low and elevated T4.   EKG showed wide-complex atrial flutter with RVR. Given amiodarone  bolus 150 mg and being started on amiodarone  drip.   ED physician spoke with on-call cardiology recommended to start amiodarone  bolus and drip and will evaluat    Assessment & Plan:   Principal Problem:   Paroxysmal atrial fibrillation with RVR (HCC) Active Problems:   Hyperthyroidism   Hyperlipidemia   Essential hypertension   Chronic combined systolic and diastolic CHF (congestive heart failure) (HCC)    History of CAD (coronary artery disease)   LBBB (left bundle branch block)   GAD (generalized anxiety disorder)   Insomnia   Elevated troponin    Assessment and Plan:  Paroxysmal fibrillation with RVR History of left bundle branch block - POA A-fib with RVR-rate as high as 143 -Transiently on amiodarone  drip, s/p bolus-  switched to p.o. by cardiology this amiodarone  at 400 mg p.o. twice daily - Continue Eliquis  - continue Lopressor  12.5 mg twice daily. - Cardiology obtain further cardiac imaging  - Consulted EP currently plan for ablation later in July.   - Magnesium and potassium optimized cardioversion and supposed to have an EP outpatient vs patient self-reported appointment at [facility] for ablation 10/30/2023. Patient requesting for it to be done here.  - Hyperparathyroidism which could be secondary from side effect of ?? amiodarone  at this time cardiology has been recommended to continue amiodarone     -Obtain echocardiogram.  Last echo in 09/19/2021. -Continue to trend electrolyte and repeat EKG in the morning.    Elevated troponin-secondary to demand ischemia -Demand ischemia, denies any chest pain - Elevated troponin 24, 30    New diagnosis of hyperthyroidism -Elevated T4 and low TSH.  Patient being recently started on methimazole  2 weeks ago.  Continue methimazole  5 mg 3 times daily   Hyperlipidemia -Continue Crestor    Chronic combined systolic and diastolic heart failure reduced EF 45 to 50% - w LBBB Left bundle branch block -Due to soft blood pressure at the baseline limited GDMT adding medication.  Continue metoprolol  and spironolactone .   History of CAD s/p DES stent Stable  - Continue Crestor , metoprolol  and Eliquis    Generalized anxiety disorder -Continue Lexapro .   Insomnia -Continue Unisom  at bedtime   BPH - Continue Flomax .  Essential  hypertension -Stable on current meds, including metoprolol , Aldactone 

## 2023-09-19 NOTE — Progress Notes (Signed)
 Progress Note  Patient Name: Tommy Rowe Date of Encounter: 09/19/2023 Primary Cardiologist: [facility]  Subjective   Overnight converted to sinus rhythm. Patient notes no CP, SOB, Palpitations.   Vital Signs    Vitals:   09/19/23 0500 09/19/23 0600 09/19/23 0612 09/19/23 0700  BP: 133/83 122/70  138/81  Pulse: 68 (!) 54  65  Resp: 14 15  16   Temp:   98.3 F (36.8 C)   TempSrc:      SpO2: 100% 95%  100%  Weight:      Height:       No intake or output data in the 24 hours ending 09/19/23 0748 Filed Weights   09/18/23 1656  Weight: 90.7 kg    Physical Exam   GEN: No acute distress.   Neck: No JVD Cardiac: RRR, no murmurs, rubs, or gallops.  Respiratory: Clear to auscultation bilaterally. GI: Soft, nontender, non-distended  MS: No edema  Labs  EKG: SBRAD Telemetry: SR to sinus bradycardia   Chemistry Recent Labs  Lab 09/18/23 1700 09/19/23 0437  NA 138 136  K 4.3 4.2  CL 103 105  CO2 23 26  GLUCOSE 114* 116*  BUN 19 17  CREATININE 0.94 0.81  CALCIUM  9.3 8.4*  PROT  --  6.4*  ALBUMIN  --  3.5  AST  --  19  ALT  --  21  ALKPHOS  --  46  BILITOT  --  1.2  GFRNONAA >60 >60  ANIONGAP 12 5     Hematology Recent Labs  Lab 09/18/23 1700 09/19/23 0437  WBC 4.5 4.2  RBC 4.42 4.33  HGB 14.3 13.7  HCT 41.0 40.1  MCV 92.8 92.6  MCH 32.4 31.6  MCHC 34.9 34.2  RDW 12.1 12.1  PLT 134* 123*     Cardiac Studies   Cardiac Studies & Procedures   ______________________________________________________________________________________________ CARDIAC CATHETERIZATION  CARDIAC CATHETERIZATION 09/18/2021  Conclusion   Mid LAD-2 lesion is 100% stenosed.   Mid LAD-1 lesion is 70% stenosed.   2nd Diag lesion is 75% stenosed.   Dist RCA lesion is 50% stenosed.   A drug-eluting stent was successfully placed using a STENT ONYX FRONTIER 2.5X22.   Post intervention, there is a 0% residual stenosis.   There is moderate left ventricular systolic  dysfunction.   LV end diastolic pressure is mildly elevated.   The left ventricular ejection fraction is 35-45% by visual estimate.  Conclusion Moderately reduced left ventricular function with anterior apical akinesis EF around 35 to 40%  Coronary Left main large relatively free of disease LAD large with a 75% proximal 100% mid TIMI 0 flow Diagonal 2 had a 80% proximal lesion TIMI-3 flow Circumflex large minor irregularities RCA with large minor irregularities with a 50% distal lesion TIMI-3 flow  Intervention Successful PCI and stent of mid LAD with DES stent Onyx frontier 2.5 x 22 mm stent was deployed Postdilated with a [place] trek Neo at 14 atm Lesion was reduced from 100% down to 0% and TIMI-3 flow was restored from TIMI 0 Minx was deployed in the right groin Patient tolerated procedure well No complications  Findings Coronary Findings Diagnostic  Dominance: Right  Left Anterior Descending Mid LAD-1 lesion is 70% stenosed. Mid LAD-2 lesion is 100% stenosed. Vessel is the culprit lesion. The lesion is type C, located at the major branch, discrete and thrombotic. The lesion was not previously treated . The stenosis was measured by a visual reading. Pressure wire/FFR was not performed  on the lesion. IVUS was not performed.  Second Diagonal Branch 2nd Diag lesion is 75% stenosed.  Right Coronary Artery Dist RCA lesion is 50% stenosed. Vessel is not the culprit lesion. The lesion is type A, located at the major branch, focal and discrete. The lesion was not previously treated . The stenosis was measured by a visual reading. Pressure wire/FFR was not performed on the lesion. IVUS was not performed. No optical coherence tomography (OCT) was performed.  Intervention  Mid LAD-2 lesion Stent Lesion length:  18 mm. CATH VISTA GUIDE 6FR XB3.5 guide catheter was inserted. Lesion crossed with guidewire using a WIRE G HI TQ BMW 190. Pre-stent angioplasty was performed using a BALLN  EUPHORA RX 2.5X15. Maximum pressure:  12 atm. Inflation time:  10 sec. A drug-eluting stent was successfully placed using a STENT ONYX FRONTIER 2.5X22. Maximum pressure: 12 atm. Inflation time: 10 sec. Minimum lumen area:  2.6 mm. Stent strut is well apposed. Stent does not overlap previously placed stentPost-stent angioplasty was performed using a BALLN [place] TREK NEO RX 2.5X15. Maximum pressure:  14 atm. Inflation time:  10 sec. Post-Intervention Lesion Assessment The intervention was successful. Intentional subintimal strategy was not used. Embolic protection device was not deployed. The guidewire was not threaded through a graft to reach the lesion. Pre-interventional TIMI flow is 0. Post-intervention TIMI flow is 3. Treated lesion length:  18 mm. No complications occurred at this lesion. Pressure gradient/FFR was not measured. Aaron AasUltrasound (IVUS) was not performed on the lesion. No optical coherence tomography (OCT) was performed. There is a 0% residual stenosis post intervention.   CARDIAC CATHETERIZATION  CARDIAC CATHETERIZATION 04/11/2021  Conclusion   Mid LAD lesion is 40% stenosed.   The left ventricular systolic function is normal.   LV end diastolic pressure is normal.   There is no aortic valve stenosis.  Mild mid LAD disease, treat medically.  Findings Coronary Findings Diagnostic  Dominance: Right  Left Anterior Descending Mid LAD lesion is 40% stenosed.  Intervention  No interventions have been documented.     ECHOCARDIOGRAM  ECHOCARDIOGRAM COMPLETE 09/19/2021  Narrative ECHOCARDIOGRAM REPORT    Patient Name:   Tommy Rowe Date of Exam: 09/19/2021 Medical Rec #:  [id number]      Height:       72.0 in Accession #:    [id number]     Weight:       200.1 lb Date of Birth:  06/12/1955     BSA:          2.131 m Patient Age:    68 years       BP:           93/37 mmHg Patient Gender: M              HR:           65 bpm. Exam Location:  [facility]  Procedure: 2D Echo,  Cardiac Doppler and Color Doppler  Indications:     NSTEMI I21.4  History:         Patient has prior history of Echocardiogram examinations, most recent 08/27/2019. Arrythmias:Atrial Fibrillation; Risk Factors:Hypertension and Dyslipidemia. Anxiety.  Sonographer:     Broadus Canes Referring Phys:  [id number] XILIN NIU Diagnosing Phys: Marylen Snowman   Sonographer Comments: Suboptimal apical window. IMPRESSIONS   1. Left ventricular ejection fraction, by estimation, is 45 to 50%. The left ventricle has mildly decreased function. The left ventricle demonstrates regional wall motion abnormalities (see scoring diagram/findings for description). There  is mild left ventricular hypertrophy. Left ventricular diastolic parameters are consistent with Grade I diastolic dysfunction (impaired relaxation). There is hypokinesis of the left ventricular, mid-apical anterior wall. 2. Right ventricular systolic function is normal. The right ventricular size is normal. 3. The mitral valve is normal in structure. No evidence of mitral valve regurgitation. No evidence of mitral stenosis. 4. The aortic valve is normal in structure. Aortic valve regurgitation is not visualized. No aortic stenosis is present.  Conclusion(s)/Recommendation(s): Technically difficult study. No subcostal view completed.  FINDINGS Left Ventricle: Left ventricular ejection fraction, by estimation, is 45 to 50%. The left ventricle has mildly decreased function. The left ventricle demonstrates regional wall motion abnormalities. The left ventricular internal cavity size was normal in size. There is mild left ventricular hypertrophy. Left ventricular diastolic parameters are consistent with Grade I diastolic dysfunction (impaired relaxation).  Right Ventricle: The right ventricular size is normal. Right vetricular wall thickness was not well visualized. Right ventricular systolic function is normal.  Left Atrium: Left atrial size was normal in  size.  Right Atrium: Right atrial size was normal in size.  Pericardium: There is no evidence of pericardial effusion.  Mitral Valve: The mitral valve is normal in structure. No evidence of mitral valve regurgitation. No evidence of mitral valve stenosis. MV peak gradient, 4.7 mmHg. The mean mitral valve gradient is 2.0 mmHg.  Tricuspid Valve: The tricuspid valve is not well visualized. Tricuspid valve regurgitation is trivial.  Aortic Valve: The aortic valve is normal in structure. Aortic valve regurgitation is not visualized. No aortic stenosis is present. Aortic valve mean gradient measures 4.0 mmHg. Aortic valve peak gradient measures 6.7 mmHg. Aortic valve area, by VTI measures 3.03 cm.  Pulmonic Valve: The pulmonic valve was not well visualized.  Aorta: The aortic root is normal in size and structure.  Venous: The inferior vena cava was not well visualized.  IAS/Shunts: The interatrial septum was not well visualized.   LEFT VENTRICLE PLAX 2D LVIDd:         4.50 cm      Diastology LVIDs:         3.30 cm      LV e' medial:    6.42 cm/s LV PW:         1.10 cm      LV E/e' medial:  13.1 LV IVS:        1.70 cm      LV e' lateral:   8.59 cm/s LVOT diam:     2.00 cm      LV E/e' lateral: 9.8 LV SV:         68 LV SV Index:   32 LVOT Area:     3.14 cm  LV Volumes (MOD) LV vol d, MOD A2C: 82.4 ml LV vol d, MOD A4C: 116.0 ml LV vol s, MOD A2C: 63.6 ml LV vol s, MOD A4C: 59.9 ml LV SV MOD A2C:     18.8 ml LV SV MOD A4C:     116.0 ml LV SV MOD BP:      35.4 ml  RIGHT VENTRICLE RV Basal diam:  3.30 cm RV S prime:     17.70 cm/s TAPSE (M-mode): 2.4 cm  LEFT ATRIUM           Index        RIGHT ATRIUM           Index LA diam:      3.10 cm 1.45 cm/m   RA Area:  15.50 cm LA Vol (A4C): 67.9 ml 31.86 ml/m  RA Volume:   35.30 ml  16.56 ml/m AORTIC VALVE AV Area (Vmax):    2.63 cm AV Area (Vmean):   2.77 cm AV Area (VTI):     3.03 cm AV Vmax:           129.00 cm/s AV  Vmean:          92.800 cm/s AV VTI:            0.226 m AV Peak Grad:      6.7 mmHg AV Mean Grad:      4.0 mmHg LVOT Vmax:         108.00 cm/s LVOT Vmean:        81.900 cm/s LVOT VTI:          0.218 m LVOT/AV VTI ratio: 0.96  AORTA Ao Root diam: 3.40 cm  MITRAL VALVE               TRICUSPID VALVE MV Area (PHT): 3.24 cm    TR Peak grad:   11.0 mmHg MV Area VTI:   2.04 cm    TR Vmax:        166.00 cm/s MV Peak grad:  4.7 mmHg MV Mean grad:  2.0 mmHg    SHUNTS MV Vmax:       1.08 m/s    Systemic VTI:  0.22 m MV Vmean:      61.8 cm/s   Systemic Diam: 2.00 cm MV Decel Time: 234 msec MV E velocity: 84.00 cm/s MV A velocity: 75.00 cm/s MV E/A ratio:  1.12  Marylen Snowman Electronically signed by Marylen Snowman Signature Date/Time: 09/19/2021/9:48:22 AM    Final          ______________________________________________________________________________________________        Assessment & Plan    Persistent Atrial fibrillation and Atrial Flutter Hyperthyroidism - Continuing AC; unable to ablate today (discussed with EP) transitioned back to DOAC and can eat - transition to PO Amiodarone  today - Ultimately I worry he will have return to RVR if he does not get aggressive arrhythmia care or aggressive endocrinology care: presently he is on methimazole , still waiting to hear back from endocrinology at [facility] and currently managed by [facility]; discussed care - I have reached out to EP at out institution about expediting ablation; they will discuss and, if appropriate, formally see - we have discussed the risks of AIT; given three admission and limited options unless the above can be expeditited will plan for amiodarone  400 mg PO BID for 10 days then 200 mg PO daily; encouraged him to follow up with his non [facility] Cardiologist for potential expediting care - will Cancel BP echo   CAD HLD Known LBBB - asymptomatic  - no ASA because of AC - continue statin  Recovered HF - euvolemic; LVEF ~  50% after CAD medical management   Discharge Planning: Once definite Hyperthyroidism planning and (potential) expediting of ablation can be arranged, patient is reasonable for Discharge.  Discussed with Dr. Eilene Grater, Dr. Daneil Dunker; cancelled echo  For questions or updates, please contact [facility] Please consult [email or web address] for contact info under Cardiology/STEMI.      Gloriann Larger, MD FASE [facility] Cardiologist [facility]  [address] [place], [address] [place], [place] [address] [phone]  7:48 AM

## 2023-09-19 NOTE — Progress Notes (Signed)
 CT for ablation consideration.

## 2023-09-21 ENCOUNTER — Telehealth: Payer: Self-pay | Admitting: Cardiology

## 2023-09-21 NOTE — Telephone Encounter (Signed)
 Outpatient service line: Tachycardia  Patient with history of atrial fibrillation/flutter status post failed DCCV.  Per chart review it appears that EP has possible plans for ablation.  Has pending CT scan.  He is calling reporting he is in A-fib again and with heart rates roughly in the 130s.  He is relatively asymptomatic without any significant complaints of shortness of breath, chest pain.  Does have some mild complaints of dizziness but not functionally limited by this.  Plan: Discussed goal for heart rate around 110.  Increasing Lopressor  from 12.5 mg twice daily to 25 mg twice daily.  Discussed he can take this up to every 6 hours if needed as long as blood pressure is tolerating Patient had reported that he had scheduled ablation for July 2 although I do not see this has been formally scheduled. Sending correspondence to EP to make aware and to provide any other recommendations.  He is compliant with his medications and Eliquis . Of note he previously has been followed by [facility], but he would like to follow-up with us  if availability is better.

## 2023-09-22 ENCOUNTER — Telehealth: Payer: Self-pay | Admitting: Internal Medicine

## 2023-09-22 DIAGNOSIS — E0590 Thyrotoxicosis, unspecified without thyrotoxic crisis or storm: Secondary | ICD-10-CM

## 2023-09-22 NOTE — Telephone Encounter (Signed)
 Patient Phone Call   Received page regarding patient, called back. Patient stated he is back in afib. Called yesterday, and instructed to take metoprolol  25mg , which he has since taken three times.SABRA He reports his heart rates are in the 130s-140s. He is currently on methimazole . He is scheduled for afib ablation July 2nd. He reports his external throat is hurting more than previously, and is tender. BP 102/77, HR 141. Repeated HR 141. Has essentially been taking metoprolol  tartrate 25mg  q8h with uncontrolled heart rates  Plan: -Increase metoprolol  tartrate from 25mg  to 50mg  q6h (will take 6p, 12a, 6a, 12p) -Pt to call back at 9pm to report heart rates -Strict ED return precautions, especially if HR remains uncontrolled > 140 after this intervention -Ambulatory referral to [facility]ocrinology at [facility], phone number given to pt to call tomorrow to schedule appointment.    Prentice Netters, MD Cardiology

## 2023-09-23 ENCOUNTER — Inpatient Hospital Stay (HOSPITAL_COMMUNITY)

## 2023-09-23 ENCOUNTER — Inpatient Hospital Stay (HOSPITAL_COMMUNITY)
Admission: EM | Admit: 2023-09-23 | Discharge: 2023-10-04 | DRG: 273 | Disposition: A | Discharge status: Home / Self Care | Attending: Internal Medicine | Admitting: Internal Medicine

## 2023-09-23 ENCOUNTER — Emergency Department (HOSPITAL_COMMUNITY)

## 2023-09-23 ENCOUNTER — Encounter (HOSPITAL_COMMUNITY): Payer: Self-pay

## 2023-09-23 ENCOUNTER — Telehealth: Payer: Self-pay

## 2023-09-23 ENCOUNTER — Other Ambulatory Visit: Payer: Self-pay

## 2023-09-23 DIAGNOSIS — I110 Hypertensive heart disease with heart failure: Secondary | ICD-10-CM | POA: Diagnosis present

## 2023-09-23 DIAGNOSIS — I5043 Acute on chronic combined systolic (congestive) and diastolic (congestive) heart failure: Secondary | ICD-10-CM | POA: Diagnosis not present

## 2023-09-23 DIAGNOSIS — I5020 Unspecified systolic (congestive) heart failure: Secondary | ICD-10-CM

## 2023-09-23 DIAGNOSIS — I428 Other cardiomyopathies: Secondary | ICD-10-CM | POA: Diagnosis present

## 2023-09-23 DIAGNOSIS — D696 Thrombocytopenia, unspecified: Secondary | ICD-10-CM | POA: Diagnosis present

## 2023-09-23 DIAGNOSIS — D6869 Other thrombophilia: Secondary | ICD-10-CM | POA: Diagnosis present

## 2023-09-23 DIAGNOSIS — Z8249 Family history of ischemic heart disease and other diseases of the circulatory system: Secondary | ICD-10-CM | POA: Diagnosis not present

## 2023-09-23 DIAGNOSIS — I5021 Acute systolic (congestive) heart failure: Secondary | ICD-10-CM

## 2023-09-23 DIAGNOSIS — Z79890 Hormone replacement therapy (postmenopausal): Secondary | ICD-10-CM

## 2023-09-23 DIAGNOSIS — I4589 Other specified conduction disorders: Secondary | ICD-10-CM | POA: Diagnosis present

## 2023-09-23 DIAGNOSIS — I4819 Other persistent atrial fibrillation: Secondary | ICD-10-CM | POA: Diagnosis not present

## 2023-09-23 DIAGNOSIS — N400 Enlarged prostate without lower urinary tract symptoms: Secondary | ICD-10-CM | POA: Diagnosis present

## 2023-09-23 DIAGNOSIS — I4892 Unspecified atrial flutter: Principal | ICD-10-CM

## 2023-09-23 DIAGNOSIS — I4821 Permanent atrial fibrillation: Principal | ICD-10-CM | POA: Diagnosis present

## 2023-09-23 DIAGNOSIS — E785 Hyperlipidemia, unspecified: Secondary | ICD-10-CM | POA: Diagnosis present

## 2023-09-23 DIAGNOSIS — I340 Nonrheumatic mitral (valve) insufficiency: Secondary | ICD-10-CM | POA: Diagnosis not present

## 2023-09-23 DIAGNOSIS — F411 Generalized anxiety disorder: Secondary | ICD-10-CM | POA: Diagnosis present

## 2023-09-23 DIAGNOSIS — Z87891 Personal history of nicotine dependence: Secondary | ICD-10-CM

## 2023-09-23 DIAGNOSIS — I471 Supraventricular tachycardia, unspecified: Secondary | ICD-10-CM | POA: Diagnosis present

## 2023-09-23 DIAGNOSIS — I4891 Unspecified atrial fibrillation: Secondary | ICD-10-CM | POA: Diagnosis present

## 2023-09-23 DIAGNOSIS — Z955 Presence of coronary angioplasty implant and graft: Secondary | ICD-10-CM

## 2023-09-23 DIAGNOSIS — I2510 Atherosclerotic heart disease of native coronary artery without angina pectoris: Secondary | ICD-10-CM | POA: Diagnosis present

## 2023-09-23 DIAGNOSIS — E0581 Other thyrotoxicosis with thyrotoxic crisis or storm: Secondary | ICD-10-CM | POA: Diagnosis present

## 2023-09-23 DIAGNOSIS — Z79899 Other long term (current) drug therapy: Secondary | ICD-10-CM | POA: Diagnosis not present

## 2023-09-23 DIAGNOSIS — E0591 Thyrotoxicosis, unspecified with thyrotoxic crisis or storm: Secondary | ICD-10-CM | POA: Diagnosis not present

## 2023-09-23 DIAGNOSIS — E871 Hypo-osmolality and hyponatremia: Secondary | ICD-10-CM

## 2023-09-23 DIAGNOSIS — R7989 Other specified abnormal findings of blood chemistry: Secondary | ICD-10-CM

## 2023-09-23 DIAGNOSIS — I959 Hypotension, unspecified: Secondary | ICD-10-CM | POA: Diagnosis present

## 2023-09-23 DIAGNOSIS — E0590 Thyrotoxicosis, unspecified without thyrotoxic crisis or storm: Secondary | ICD-10-CM

## 2023-09-23 DIAGNOSIS — I483 Typical atrial flutter: Secondary | ICD-10-CM | POA: Diagnosis present

## 2023-09-23 DIAGNOSIS — I5042 Chronic combined systolic (congestive) and diastolic (congestive) heart failure: Secondary | ICD-10-CM | POA: Diagnosis not present

## 2023-09-23 DIAGNOSIS — I447 Left bundle-branch block, unspecified: Secondary | ICD-10-CM | POA: Diagnosis present

## 2023-09-23 DIAGNOSIS — Z7984 Long term (current) use of oral hypoglycemic drugs: Secondary | ICD-10-CM | POA: Diagnosis not present

## 2023-09-23 DIAGNOSIS — T462X5A Adverse effect of other antidysrhythmic drugs, initial encounter: Secondary | ICD-10-CM | POA: Diagnosis present

## 2023-09-23 DIAGNOSIS — Z825 Family history of asthma and other chronic lower respiratory diseases: Secondary | ICD-10-CM

## 2023-09-23 DIAGNOSIS — I10 Essential (primary) hypertension: Secondary | ICD-10-CM | POA: Diagnosis not present

## 2023-09-23 DIAGNOSIS — Z7901 Long term (current) use of anticoagulants: Secondary | ICD-10-CM

## 2023-09-23 DIAGNOSIS — I252 Old myocardial infarction: Secondary | ICD-10-CM

## 2023-09-23 LAB — ECHOCARDIOGRAM COMPLETE
Area-P 1/2: 5.27 cm2
Calc EF: 26.2 %
Height: 72 in
MV VTI: 1.94 cm2
S' Lateral: 4.8 cm
Single Plane A2C EF: 32.4 %
Single Plane A4C EF: 17.9 %
Weight: 3033.53 [oz_av]

## 2023-09-23 LAB — BASIC METABOLIC PANEL WITH GFR
Anion gap: 9 (ref 5–15)
Anion gap: 9 (ref 5–15)
BUN: 20 mg/dL (ref 8–23)
BUN: 21 mg/dL (ref 8–23)
CO2: 21 mmol/L — ABNORMAL LOW (ref 22–32)
CO2: 23 mmol/L (ref 22–32)
Calcium: 8.7 mg/dL — ABNORMAL LOW (ref 8.9–10.3)
Calcium: 8.7 mg/dL — ABNORMAL LOW (ref 8.9–10.3)
Chloride: 103 mmol/L (ref 98–111)
Chloride: 103 mmol/L (ref 98–111)
Creatinine, Ser: 0.94 mg/dL (ref 0.61–1.24)
Creatinine, Ser: 0.88 mg/dL (ref 0.61–1.24)
GFR, Estimated: >60 mL/min (ref >60)
GFR, Estimated: >60 mL/min (ref >60)
Glucose, Bld: 128 mg/dL — ABNORMAL HIGH (ref 70–99)
Glucose, Bld: 129 mg/dL — ABNORMAL HIGH (ref 70–99)
Potassium: 3.9 mmol/L (ref 3.5–5.1)
Potassium: 4 mmol/L (ref 3.5–5.1)
Sodium: 133 mmol/L — ABNORMAL LOW (ref 135–145)
Sodium: 135 mmol/L (ref 135–145)

## 2023-09-23 LAB — HEPATIC FUNCTION PANEL
ALT: 20 U/L (ref 0–44)
AST: 16 U/L (ref 15–41)
Albumin: 3.4 g/dL — ABNORMAL LOW (ref 3.5–5.0)
Alkaline Phosphatase: 40 U/L (ref 38–126)
Bilirubin, Direct: 0.3 mg/dL — ABNORMAL HIGH (ref 0.0–0.2)
Indirect Bilirubin: 1.3 mg/dL — ABNORMAL HIGH (ref 0.3–0.9)
Total Bilirubin: 1.6 mg/dL — ABNORMAL HIGH (ref 0.0–1.2)
Total Protein: 6.6 g/dL (ref 6.5–8.1)

## 2023-09-23 LAB — GLUCOSE, CAPILLARY
Glucose-Capillary: 194 mg/dL — ABNORMAL HIGH (ref 70–99)
Glucose-Capillary: 149 mg/dL — ABNORMAL HIGH (ref 70–99)

## 2023-09-23 LAB — CBC
HCT: 37.3 % — ABNORMAL LOW (ref 39.0–52.0)
HCT: 40.5 % (ref 39.0–52.0)
Hemoglobin: 12.8 g/dL — ABNORMAL LOW (ref 13.0–17.0)
Hemoglobin: 14 g/dL (ref 13.0–17.0)
MCH: 32.3 pg (ref 26.0–34.0)
MCH: 32.5 pg (ref 26.0–34.0)
MCHC: 34.3 g/dL (ref 30.0–36.0)
MCHC: 34.6 g/dL (ref 30.0–36.0)
MCV: 93.3 fL (ref 80.0–100.0)
MCV: 94.7 fL (ref 80.0–100.0)
Platelets: 118 10*3/uL — ABNORMAL LOW (ref 150–400)
Platelets: 146 10*3/uL — ABNORMAL LOW (ref 150–400)
RBC: 3.94 MIL/uL — ABNORMAL LOW (ref 4.22–5.81)
RBC: 4.34 MIL/uL (ref 4.22–5.81)
RDW: 12.1 % (ref 11.5–15.5)
RDW: 12.3 % (ref 11.5–15.5)
WBC: 9.1 10*3/uL (ref 4.0–10.5)
WBC: 9.3 10*3/uL (ref 4.0–10.5)
nRBC: 0 % (ref 0.0–0.2)
nRBC: 0 % (ref 0.0–0.2)

## 2023-09-23 LAB — HEMOGLOBIN A1C
Hgb A1c MFr Bld: 5 % (ref 4.8–5.6)
Mean Plasma Glucose: 96.8 mg/dL

## 2023-09-23 LAB — TROPONIN I (HIGH SENSITIVITY)
Troponin I (High Sensitivity): 19 ng/L — ABNORMAL HIGH (ref <18)
Troponin I (High Sensitivity): 17 ng/L (ref <18)
Troponin I (High Sensitivity): 17 ng/L (ref <18)

## 2023-09-23 LAB — PHOSPHORUS: Phosphorus: 3.4 mg/dL (ref 2.5–4.6)

## 2023-09-23 LAB — T4, FREE: Free T4: 2.95 ng/dL — ABNORMAL HIGH (ref 0.61–1.12)

## 2023-09-23 LAB — TSH: TSH: <0.01 u[IU]/mL — ABNORMAL LOW (ref 0.350–4.500)

## 2023-09-23 LAB — MAGNESIUM
Magnesium: 1.7 mg/dL (ref 1.7–2.4)
Magnesium: 2.2 mg/dL (ref 1.7–2.4)

## 2023-09-23 LAB — MRSA NEXT GEN BY PCR, NASAL: MRSA by PCR Next Gen: NOT DETECTED

## 2023-09-23 MED ORDER — IODINE STRONG (LUGOLS) 5 % PO SOLN
0.2000 mL | Freq: Three times a day (TID) | ORAL | Status: DC
  Administered 2023-09-23 (×3): 0.2 mL via ORAL
  Filled 2023-09-23 (×4): qty 0.2

## 2023-09-23 MED ORDER — PROPYLTHIOURACIL 50 MG PO TABS
100.0000 mg | ORAL_TABLET | Freq: Three times a day (TID) | ORAL | Status: DC
  Administered 2023-09-23 – 2023-09-24 (×4): 100 mg via ORAL
  Filled 2023-09-23 (×5): qty 2

## 2023-09-23 MED ORDER — PERFLUTREN LIPID MICROSPHERE
1.0000 mL | INTRAVENOUS | Status: AC | PRN
  Administered 2023-09-23: 2 mL via INTRAVENOUS

## 2023-09-23 MED ORDER — CHLORHEXIDINE GLUCONATE CLOTH 2 % EX PADS
6.0000 | MEDICATED_PAD | Freq: Every day | CUTANEOUS | Status: DC
  Administered 2023-09-23 – 2023-10-04 (×9): 6 via TOPICAL

## 2023-09-23 MED ORDER — POLYETHYLENE GLYCOL 3350 17 G PO PACK: 17.0000 g | PACK | Freq: Every day | ORAL | Status: DC | PRN

## 2023-09-23 MED ORDER — PROPRANOLOL HCL 60 MG PO TABS
60.0000 mg | ORAL_TABLET | Freq: Four times a day (QID) | ORAL | Status: DC
  Administered 2023-09-23 (×3): 60 mg via ORAL
  Filled 2023-09-23 (×5): qty 1

## 2023-09-23 MED ORDER — INSULIN ASPART 100 UNIT/ML IJ SOLN
0.0000 [IU] | Freq: Three times a day (TID) | INTRAMUSCULAR | Status: DC
  Administered 2023-09-23 – 2023-09-24 (×2): 3 [IU] via SUBCUTANEOUS
  Administered 2023-09-24: 2 [IU] via SUBCUTANEOUS
  Administered 2023-09-25: 5 [IU] via SUBCUTANEOUS
  Administered 2023-09-25 – 2023-09-26 (×3): 2 [IU] via SUBCUTANEOUS
  Administered 2023-09-26: 5 [IU] via SUBCUTANEOUS
  Administered 2023-09-27: 3 [IU] via SUBCUTANEOUS
  Administered 2023-09-27 – 2023-09-28 (×3): 2 [IU] via SUBCUTANEOUS
  Administered 2023-09-29: 3 [IU] via SUBCUTANEOUS
  Administered 2023-09-29 – 2023-10-01 (×5): 2 [IU] via SUBCUTANEOUS

## 2023-09-23 MED ORDER — INSULIN ASPART 100 UNIT/ML IJ SOLN
0.0000 [IU] | Freq: Every day | INTRAMUSCULAR | Status: DC
  Administered 2023-09-24: 2 [IU] via SUBCUTANEOUS

## 2023-09-23 MED ORDER — APIXABAN 5 MG PO TABS
5.0000 mg | ORAL_TABLET | Freq: Two times a day (BID) | ORAL | Status: DC
  Administered 2023-09-23 – 2023-10-04 (×22): 5 mg via ORAL
  Filled 2023-09-23 (×22): qty 1

## 2023-09-23 MED ORDER — MORPHINE SULFATE (PF) 2 MG/ML IV SOLN
INTRAVENOUS | Status: AC
  Administered 2023-09-23: 1 mg via INTRAVENOUS
  Filled 2023-09-23: qty 1

## 2023-09-23 MED ORDER — PROPRANOLOL HCL 40 MG PO TABS
40.0000 mg | ORAL_TABLET | Freq: Four times a day (QID) | ORAL | Status: DC
  Administered 2023-09-23 – 2023-09-26 (×10): 40 mg via ORAL
  Filled 2023-09-23 (×11): qty 1

## 2023-09-23 MED ORDER — THIAMINE MONONITRATE 100 MG PO TABS
100.0000 mg | ORAL_TABLET | Freq: Every day | ORAL | Status: DC
  Administered 2023-09-23 – 2023-10-04 (×12): 100 mg via ORAL
  Filled 2023-09-23 (×12): qty 1

## 2023-09-23 MED ORDER — MELATONIN 3 MG PO TABS
3.0000 mg | ORAL_TABLET | Freq: Every day | ORAL | Status: DC
  Administered 2023-09-24 – 2023-10-03 (×10): 3 mg via ORAL
  Filled 2023-09-23 (×10): qty 1

## 2023-09-23 MED ORDER — MAGNESIUM SULFATE 2 GM/50ML IV SOLN
2.0000 g | Freq: Once | INTRAVENOUS | Status: AC
  Administered 2023-09-23: 2 g via INTRAVENOUS
  Filled 2023-09-23: qty 50

## 2023-09-23 MED ORDER — HYDROMORPHONE HCL 1 MG/ML IJ SOLN
0.5000 mg | INTRAMUSCULAR | Status: DC | PRN
  Administered 2023-09-23: 0.5 mg via INTRAVENOUS
  Filled 2023-09-23 (×2): qty 0.5

## 2023-09-23 MED ORDER — HYDROMORPHONE HCL 1 MG/ML IJ SOLN
0.5000 mg | Freq: Once | INTRAMUSCULAR | Status: AC
  Administered 2023-09-23: 0.5 mg via INTRAVENOUS
  Filled 2023-09-23: qty 0.5

## 2023-09-23 MED ORDER — MORPHINE SULFATE (PF) 2 MG/ML IV SOLN: 1.0000 mg | Freq: Once | INTRAVENOUS | Status: AC

## 2023-09-23 MED ORDER — ROSUVASTATIN CALCIUM 20 MG PO TABS
20.0000 mg | ORAL_TABLET | Freq: Every day | ORAL | Status: DC
  Administered 2023-09-23 – 2023-10-03 (×11): 20 mg via ORAL
  Filled 2023-09-23 (×11): qty 1

## 2023-09-23 MED ORDER — LACTATED RINGERS IV BOLUS
1000.0000 mL | Freq: Once | INTRAVENOUS | Status: AC
  Administered 2023-09-23: 1000 mL via INTRAVENOUS

## 2023-09-23 MED ORDER — NALOXONE HCL 0.4 MG/ML IJ SOLN: 0.4000 mg | INTRAMUSCULAR | Status: DC | PRN

## 2023-09-23 MED ORDER — TAMSULOSIN HCL 0.4 MG PO CAPS: 0.4000 mg | ORAL_CAPSULE | Freq: Every morning | ORAL | Status: DC

## 2023-09-23 MED ORDER — ESCITALOPRAM OXALATE 10 MG PO TABS
20.0000 mg | ORAL_TABLET | Freq: Every day | ORAL | Status: DC
  Administered 2023-09-23 – 2023-09-26 (×4): 20 mg via ORAL
  Filled 2023-09-23 (×4): qty 2

## 2023-09-23 MED ORDER — FOLIC ACID 1 MG PO TABS
1.0000 mg | ORAL_TABLET | Freq: Every day | ORAL | Status: DC
  Administered 2023-09-23 – 2023-10-04 (×12): 1 mg via ORAL
  Filled 2023-09-23 (×12): qty 1

## 2023-09-23 MED ORDER — HYDROCORTISONE SOD SUC (PF) 100 MG IJ SOLR
100.0000 mg | Freq: Three times a day (TID) | INTRAMUSCULAR | Status: DC
  Administered 2023-09-23 – 2023-09-26 (×10): 100 mg via INTRAVENOUS
  Filled 2023-09-23 (×10): qty 2

## 2023-09-23 MED ORDER — METOPROLOL TARTRATE 50 MG PO TABS
50.0000 mg | ORAL_TABLET | Freq: Four times a day (QID) | ORAL | Status: DC
  Administered 2023-09-23: 50 mg via ORAL
  Filled 2023-09-23: qty 1

## 2023-09-23 MED ORDER — DOCUSATE SODIUM 100 MG PO CAPS
100.0000 mg | ORAL_CAPSULE | Freq: Two times a day (BID) | ORAL | Status: DC | PRN
  Administered 2023-09-26: 100 mg via ORAL
  Filled 2023-09-23 (×2): qty 1

## 2023-09-23 NOTE — Progress Notes (Signed)
 Advanced Heart Failure Rounding Note  Cardiologist: None  Chief Complaint: A fbi RVR Hypothyroidism Subjective:   Remains in  A Fib RVR.   Denies SOB.    Objective:   Weight Range: 86 kg Body mass index is 25.71 kg/m.   Vital Signs:   Temp:  [98.4 F (36.9 C)-99.4 F (37.4 C)] 98.4 F (36.9 C) (06/23 0830) Pulse Rate:  [137-146] 139 (06/23 0730) Resp:  [17-36] 21 (06/23 0730) BP: (84-102)/(55-81) 102/81 (06/23 0730) SpO2:  [90 %-100 %] 90 % (06/23 0730) Weight:  [86 kg-89.8 kg] 86 kg (06/23 0600) Last BM Date : 09/23/23  Weight change: Filed Weights   09/23/23 0044 09/23/23 0600  Weight: 89.8 kg 86 kg    Intake/Output:   Intake/Output Summary (Last 24 hours) at 09/23/2023 0905 Last data filed at 09/23/2023 0600 Gross per 24 hour  Intake 1045.81 ml  Output --  Net 1045.81 ml      Physical Exam    General:   No resp difficulty Neck: supple. no JVD.  Cor: PMI nondisplaced. Tachy Regular rate & rhythm. No rubs, gallops or murmurs. Lungs: clear Abdomen: soft, nontender, nondistended.  Extremities: no cyanosis, clubbing, rash, edema Neuro: alert & oriented x3    Telemetry    A flutter RVR.   EKG      Labs    CBC Recent Labs    09/23/23 0050 09/23/23 0558  WBC 9.1 9.3  HGB 14.0 12.8*  HCT 40.5 37.3*  MCV 93.3 94.7  PLT 146* 118*   Basic Metabolic Panel Recent Labs    93/76/74 0050 09/23/23 0201 09/23/23 0558  NA 133*  --  135  K 3.9  --  4.0  CL 103  --  103  CO2 21*  --  23  GLUCOSE 128*  --  129*  BUN 20  --  21  CREATININE 0.94  --  0.88  CALCIUM  8.7*  --  8.7*  MG  --  1.7 2.2  PHOS  --   --  3.4   Liver Function Tests Recent Labs    09/23/23 0201  AST 16  ALT 20  ALKPHOS 40  BILITOT 1.6*  PROT 6.6  ALBUMIN 3.4*   No results for input(s): LIPASE, AMYLASE in the last 72 hours. Cardiac Enzymes No results for input(s): CKTOTAL, CKMB, CKMBINDEX, TROPONINI in the last 72 hours.  BNP: BNP (last 3  results) Recent Labs    09/04/23 0727  BNP 154.9*    ProBNP (last 3 results) No results for input(s): PROBNP in the last 8760 hours.   D-Dimer No results for input(s): DDIMER in the last 72 hours. Hemoglobin A1C No results for input(s): HGBA1C in the last 72 hours. Fasting Lipid Panel No results for input(s): CHOL, HDL, LDLCALC, TRIG, CHOLHDL, LDLDIRECT in the last 72 hours. Thyroid  Function Tests Recent Labs    09/23/23 0201  TSH <0.010*    Other results:   Imaging    DG Chest 2 View Result Date: 09/23/2023 CLINICAL DATA:  Cardiac palpitations EXAM: CHEST - 2 VIEW COMPARISON:  09/18/2023 FINDINGS: Cardiac shadow is within normal limits. Lungs are well aerated bilaterally. No focal infiltrate or effusion is seen. No bony abnormality is noted. IMPRESSION: No active cardiopulmonary disease. Electronically Signed   By: Oneil Devonshire M.D.   On: 09/23/2023 01:19     Medications:     Scheduled Medications:  apixaban   5 mg Oral BID   Chlorhexidine  Gluconate Cloth  6 each  Topical Daily   escitalopram   20 mg Oral QHS   folic acid  1 mg Oral Daily   hydrocortisone sod succinate (SOLU-CORTEF) inj  100 mg Intravenous Q8H   Iodine Strong (Lugols)  0.2 mL Oral TID   propranolol  60 mg Oral QID   propylthiouracil  100 mg Oral Q8H   rosuvastatin   20 mg Oral QHS   thiamine  100 mg Oral Daily    Infusions:   PRN Medications: docusate sodium , HYDROmorphone  (DILAUDID ) injection, naLOXone (NARCAN)  injection, polyethylene glycol    Patient Profile   Admitted with A flutter RVR and Thyroid  Storm  Assessment/Plan  1. A flutter  RVR Followed by [facility], Dr Debby , EP and was seen 09/13/23. Considering ablation in July or August.  Previously on amio , sotalol, dofetilide, flecainide, propafenone, and dronedarone .   He has had multiple cardioversion. Failed to covert at 100J, 150 J.  Has also had ablation x3.  - Continue eliquis  5 mg twice a day  . -Consult EP - Started on propanalol.    2 . Thyroid  Storm  TSH, undetectable.  T4, 2.95  Per CCM. On soldumedrol, PTU, and Propanalol. Will need outpatient follow up with Endocrinology   3. Chronic HFmEF, NICM  Most recent EF  45-50%. Repeat Echo  Appears euvolemic.  GDMT limited with soft BP.   Length of Stay: 0  Greig Mosses, NP  09/23/2023, 9:05 AM  Advanced Heart Failure Team Pager [phone] (M-F; 7a - 5p)  Please contact [facility] for night-coverage after hours (5p -7a ) and weekends on [email or web address]

## 2023-09-23 NOTE — ED Notes (Signed)
 Pt did well ambulating to the RR

## 2023-09-23 NOTE — ED Provider Notes (Signed)
 [facility] AT [facility] Provider Note   CSN: [id number] Arrival date & time: 09/23/23  0034     Patient presents with: Palpitations   Tommy Rowe is a 68 y.o. male.   The history is provided by the patient.  Palpitations He has a history of hypertension, hyperlipidemia, hyperthyroidism, atrial fibrillation and comes in because of ongoing problems with rapid heart rate.  He was discharged from the hospital 3 days ago, but had resumption of atrial fibrillation with rapid heart rate that day.  He has tried increasing his metoprolol  dose but continues to have heart rate 140-145.  He is also noted for the last 2 days he has some soreness in his neck and pain in his chest when he takes a deep breath.  He is anticoagulated on apixaban .   Prior to Admission medications   Medication Sig Start Date End Date Taking? Authorizing Provider  desonide (DESOWEN) 0.05 % ointment Apply 1 Application topically daily as needed.    [provider]  doxylamine , Sleep, (UNISOM ) 25 MG tablet Take 12.5 mg by mouth at bedtime as needed for sleep.    [provider]  dronedarone  (MULTAQ ) 400 MG tablet Take 1 tablet (400 mg total) by mouth 2 (two) times daily with a meal. 09/19/23 10/19/23  Shahmehdi, Adriana LABOR, MD  ELIQUIS  5 MG TABS tablet Take 5 mg by mouth 2 (two) times daily. 08/17/21   [provider]  escitalopram  (LEXAPRO ) 20 MG tablet Take 20 mg by mouth at bedtime. 11/05/16   [provider]  methimazole  (TAPAZOLE ) 5 MG tablet Take 1 tablet (5 mg total) by mouth 3 (three) times daily. 09/05/23 10/05/23  Shah, Vipul, MD  metoprolol  tartrate (LOPRESSOR ) 25 MG tablet Take 0.5 tablets (12.5 mg total) by mouth 2 (two) times daily. 09/19/23 10/19/23  Shahmehdi, Seyed A, MD  Multiple Vitamin (MULTIVITAMIN WITH MINERALS) TABS tablet Take 1 tablet by mouth every morning. Centrum Silver for Men 50+    [provider]  rosuvastatin  (CRESTOR ) 20 MG  tablet Take 20 mg by mouth at bedtime.    [provider]  spironolactone  (ALDACTONE ) 25 MG tablet Take 0.5 tablets (12.5 mg total) by mouth every morning. 09/20/23 10/20/23  Shahmehdi, Adriana LABOR, MD  tamsulosin  (FLOMAX ) 0.4 MG CAPS capsule Take 1 capsule (0.4 mg total) by mouth daily. Patient taking differently: Take 0.4 mg by mouth every morning. 08/29/23   Francisca Redell BROCKS, MD  triamcinolone cream (KENALOG) 0.1 % Apply 1 Application topically daily as needed.    [provider]    Allergies: Patient has no known allergies.    Review of Systems  Cardiovascular:  Positive for palpitations.  All other systems reviewed and are negative.   Updated Vital Signs BP (!) 84/59 (BP Location: Right Arm)   Pulse (!) 146   Temp 99.4 F (37.4 C)   Resp 18   Ht 6' (1.829 m)   Wt 89.8 kg   SpO2 96%   BMI 26.85 kg/m   Physical Exam Vitals and nursing note reviewed.   68 year old male, resting comfortably and in no acute distress. Vital signs are significant for rapid heart rate and low blood pressure. Oxygen saturation is 96%, which is normal. Head is normocephalic and atraumatic. PERRLA, EOMI. Oropharynx is clear. Neck is nontender and supple without adenopathy or JVD. Lungs are clear without rales, wheezes, or rhonchi. Chest is nontender. Heart is tachycardic without murmur. Abdomen is soft, flat, nontender. Extremities  have no cyanosis or edema, full range of motion is present. Skin is warm and dry without rash. Neurologic: Awake and alert, moves all extremities equally.  (all labs ordered are listed, but only abnormal results are displayed) Labs Reviewed  BASIC METABOLIC PANEL WITH GFR - Abnormal; Notable for the following components:      Result Value   Sodium 133 (*)    CO2 21 (*)    Glucose, Bld 128 (*)    Calcium  8.7 (*)    All other components within normal limits  CBC - Abnormal; Notable for the following components:   Platelets 146 (*)    All other  components within normal limits  TROPONIN I (HIGH SENSITIVITY)    EKG: EKG Interpretation Date/Time:  Monday September 23 2023 00:44:20 EDT Ventricular Rate:  145 PR Interval:  82 QRS Duration:  132 QT Interval:  302 QTC Calculation: 469 R Axis:   72  Text Interpretation: Atrial flutter with rapid ventricular response with occasional Premature ventricular complexes Left bundle branch block Abnormal ECG When compared with ECG of 19-Sep-2023 05:25, Atrial flutter has replaced Sinus rhythm Confirmed by Raford Lenis ([id number]) on 09/23/2023 1:23:48 AM  Radiology: [facility] Chest 2 View Result Date: 09/23/2023 CLINICAL DATA:  Cardiac palpitations EXAM: CHEST - 2 VIEW COMPARISON:  09/18/2023 FINDINGS: Cardiac shadow is within normal limits. Lungs are well aerated bilaterally. No focal infiltrate or effusion is seen. No bony abnormality is noted. IMPRESSION: No active cardiopulmonary disease. Electronically Signed   By: Oneil Devonshire M.D.   On: 09/23/2023 01:19     Procedures  Cardiac monitor shows atrial flutter with 2-1 conduction, heart rate 140, per my interpretation.                                 Medical Decision Making Amount and/or Complexity of Data Reviewed Labs: ordered. Radiology: ordered.  Risk Decision regarding hospitalization.   Recurrent atrial flutter in the setting of hyperthyroidism.  He has not been stable following cardioversion, so I do not think repeat cardioversion will be helpful.  I have reviewed his past records, and TSH was undetectable and T4 elevated at 2.85 on 09/18/2023.  He had been hospitalized 09/18/2023-09/19/2023 with paroxysmal atrial fibrillation and hyperthyroidism, had been started on methimazole  about 1 month ago.  He is scheduled for cardiac ablation on 10/02/2023.  I suspect that his atrial fibrillation/atrial flutter will be very difficult to control until his hyperthyroidism is controlled.  I have reviewed his electrocardiogram, and my interpretation is atrial  flutter with rapid ventricular response.  Repeat ECG showed similar findings.  I have discussed case with Dr. Agapito of cardiology service who is also concerned that atrial flutter will not be able to be controlled until his hyperthyroidism is controlled.  There is concern for thyroid  storm.  I have reviewed his laboratory tests, and my interpretation is mild hyponatremia which is not clinically significant, elevated random glucose level which will need to be followed as an outpatient, normal CBC, borderline elevated troponin which is actually decreased compared with his baseline and repeat troponin is normal, normal magnesium , mildly elevated total bilirubin, TSH not detectable and T4 higher than what it had been recently.  I have discussed case with Dr. Maree of pulmonary critical care service who agrees to admit him to ICU for management of possible thyroid  storm.  CRITICAL CARE Performed by: Lenis Raford Total critical care time: 85 minutes Critical care  time was exclusive of separately billable procedures and treating other patients. Critical care was necessary to treat or prevent imminent or life-threatening deterioration. Critical care was time spent personally by me on the following activities: development of treatment plan with patient and/or surrogate as well as nursing, discussions with consultants, evaluation of patient's response to treatment, examination of patient, obtaining history from patient or surrogate, ordering and performing treatments and interventions, ordering and review of laboratory studies, ordering and review of radiographic studies, pulse oximetry and re-evaluation of patient's condition.     Final diagnoses:  None    ED Discharge Orders     None          Raford Lenis, MD 09/23/23 [phone]

## 2023-09-23 NOTE — Progress Notes (Addendum)
 09/23/2023 PTU, stress steroids Metoprolol  to propranolol as latter helps reduce T4 conversion DC amio indefinitely Add lugol's PRN dilaudid  Not sure DCCV will help if thyroid  still unchecked but will d/w AHF Maintain normal electrolytes If needs DCCV likely will need some pressors as well Refractory management apparently can involve cholestyramine and even PLEX PRN  Rolan Sharps MD PCCM

## 2023-09-23 NOTE — ED Notes (Addendum)
 Introduced myself to patient. Wife is at bedside. Patient states he took 50mg  of metoprolol  at 12 and 6pm at the direction of his cardiologist. States it has not helped his heart rate. Rates Chest pain 6/10.

## 2023-09-23 NOTE — Consult Note (Addendum)
 ELECTROPHYSIOLOGY CONSULT NOTE    Patient ID: Tommy Rowe MRN: [id number], DOB/AGE: 06/05/1955 68 y.o.  Admit date: 09/23/2023 Date of Consult: 09/23/2023  Primary Physician: Darra Hamilton, PA-C Primary Cardiologist: None  Electrophysiologist: Dr. Kennyth   Referring Provider: Dr. Claudene  Patient Profile: Tommy Rowe is a 68 y.o. male with a history of AF/AFL, CAD s/p DES to LAD 08/2021, HTN, HLD, LBBB, hyperthyroidism & anxiety who is being seen today for the evaluation of AFwRVR at the request of Dr. Gardenia.  HPI:  Tommy Rowe is a 68 y.o. male who presented to [facility] ER on 6/23 with reports of AFwRVR.    Seen inpatient 09/19/23 by Dr. Kennyth. He was on amiodarone  after a back surgery ~in 2022 for approximately 2 years. It was stopped as he thought he was having lightheadedness with the medication. He had a DCCV has had 3 recent DCCV - the first held NSR for ~ 2-3 weeks, the others failed immediately. Most recent ECHO 09/11/23 at [facility] showed LVEF 55%. During 6/19 admit, he was restarted on amiodarone  and transitioned to dronedarone  400 mg BID as plan for bridge to ablation. Pt preferred to have ablation as soon as possible (at either [facility] or [facility])   He was planned for an ablation at [facility] on 10/29/33 but has had at least 2 recent admits with [facility].  He reportedly improved on amiodarone . He was discovered to have hyperthyroidism and was started on methimazole .   On 09/22/23, he recognized he was in [facility] at home and took 25mg  Metoprolol  x3 tabs without success. He spoke with his primary Cardiology team at [facility] and was instructed to increase Metoprolol  to 50 mg BID on 6/22 but this did not reduce his HR either prompting ER evaluation. He reported on admit that he had tenderness in his neck, thyroid , chest pain / pain with deep inspiration, lightheadedness, & dizziness.  No LOC. He had been on amiodarone  PTA. On arrival, BP was low and he was not given medications to slow his HR.  He was thought to be in AFL in 140's.  TSH on admit <0.010, free T4 2.95.   He denies dyspnea, PND, orthopnea, nausea, vomiting, dizziness, syncope, edema, weight gain, or early satiety. Reports sweating, chest pressure prior to admit, diarrhea and throat pain.    Labs Potassium4.0 (06/23 9441) Magnesium   2.2 (06/23 0558) Creatinine, ser  0.88 (06/23 0558) PLT  118* (06/23 0558) HGB  12.8* (06/23 0558) WBC 9.3 (06/23 0558) Troponin I (High Sensitivity)17 (06/23 0558).    Past Medical History:  Diagnosis Date   A-fib (HCC)    Anxiety    HLD (hyperlipidemia)    HTN (hypertension)      Surgical History:  Past Surgical History:  Procedure Laterality Date   CORONARY STENT INTERVENTION N/A 09/18/2021   Procedure: CORONARY STENT INTERVENTION;  Surgeon: Florencio Cara BIRCH, MD;  Location: [facility] INVASIVE CV LAB;  Service: Cardiovascular;  Laterality: N/A;   FACIAL FRACTURE SURGERY  1980   LEFT HEART CATH AND CORONARY ANGIOGRAPHY Left 04/11/2021   Procedure: LEFT HEART CATH AND CORONARY ANGIOGRAPHY;  Surgeon: Fernand Denyse LABOR, MD;  Location: [facility] INVASIVE CV LAB;  Service: Cardiovascular;  Laterality: Left;   LEFT HEART CATH AND CORONARY ANGIOGRAPHY N/A 09/18/2021   Procedure: LEFT HEART CATH AND CORONARY ANGIOGRAPHY;  Surgeon: Florencio Cara BIRCH, MD;  Location: [facility] INVASIVE CV LAB;  Service: Cardiovascular;  Laterality: N/A;   LUMBAR LAMINECTOMY/DECOMPRESSION MICRODISCECTOMY N/A 08/26/2019   Procedure: RIGHT  L4-5 MICRODISCECTOMY;  Surgeon: Clois Fret, MD;  Location: [facility] ORS;  Service: Neurosurgery;  Laterality: N/A;     Medications Prior to Admission  Medication Sig Dispense Refill Last Dose/Taking   desonide (DESOWEN) 0.05 % ointment Apply 1 Application topically daily as needed.   Past Month   doxylamine , Sleep, (UNISOM ) 25 MG tablet Take 12.5 mg by mouth at bedtime as needed for sleep.   09/22/2023 Bedtime   dronedarone  (MULTAQ ) 400 MG tablet Take 1 tablet (400 mg total) by mouth  2 (two) times daily with a meal. 60 tablet 0 09/22/2023 Evening   ELIQUIS  5 MG TABS tablet Take 5 mg by mouth 2 (two) times daily.   09/22/2023 at  7:30 PM   escitalopram  (LEXAPRO ) 20 MG tablet Take 20 mg by mouth at bedtime.  1 09/22/2023 Bedtime   methimazole  (TAPAZOLE ) 5 MG tablet Take 1 tablet (5 mg total) by mouth 3 (three) times daily. 90 tablet 0 09/22/2023 Bedtime   metoprolol  tartrate (LOPRESSOR ) 25 MG tablet Take 0.5 tablets (12.5 mg total) by mouth 2 (two) times daily. 30 tablet 2 09/22/2023 Bedtime   Multiple Vitamin (MULTIVITAMIN WITH MINERALS) TABS tablet Take 1 tablet by mouth every morning. Centrum Silver for Men 50+   09/22/2023 Morning   rosuvastatin  (CRESTOR ) 20 MG tablet Take 20 mg by mouth at bedtime.   09/22/2023 Bedtime   spironolactone  (ALDACTONE ) 25 MG tablet Take 0.5 tablets (12.5 mg total) by mouth every morning. 30 tablet 2 09/22/2023 Morning   tamsulosin  (FLOMAX ) 0.4 MG CAPS capsule Take 1 capsule (0.4 mg total) by mouth daily. (Patient taking differently: Take 0.4 mg by mouth every morning.) 30 capsule 11 09/22/2023 Morning    Inpatient Medications:   apixaban   5 mg Oral BID   Chlorhexidine  Gluconate Cloth  6 each Topical Daily   escitalopram   20 mg Oral QHS   folic acid  1 mg Oral Daily   hydrocortisone sod succinate (SOLU-CORTEF) inj  100 mg Intravenous Q8H   Iodine Strong (Lugols)  0.2 mL Oral TID   propranolol  60 mg Oral QID   propylthiouracil  100 mg Oral Q8H   rosuvastatin   20 mg Oral QHS   thiamine  100 mg Oral Daily    Allergies: No Known Allergies  Family History  Problem Relation Age of Onset   COPD Mother    Heart attack Father    Valvular heart disease Sister      Physical Exam: Vitals:   09/23/23 0600 09/23/23 0700 09/23/23 0730 09/23/23 0830  BP: 95/72 97/78 102/81   Pulse: (!) 139 (!) 138 (!) 139   Resp: (!) 23 (!) 22 (!) 21   Temp:    98.4 F (36.9 C)  TempSrc:    Oral  SpO2: 93% 90% 90%   Weight: 86 kg     Height:        GEN- NAD,  A&O x 3, normal affect HEENT: Normocephalic, atraumatic Lungs- CTAB, Normal effort.  Heart- Regular rate and rhythm, No M/G/R.  GI- Soft, NT, ND.  Extremities- No clubbing, cyanosis, or edema   Radiology/Studies: DG Chest 2 View Result Date: 09/23/2023 CLINICAL DATA:  Cardiac palpitations EXAM: CHEST - 2 VIEW COMPARISON:  09/18/2023 FINDINGS: Cardiac shadow is within normal limits. Lungs are well aerated bilaterally. No focal infiltrate or effusion is seen. No bony abnormality is noted. IMPRESSION: No active cardiopulmonary disease. Electronically Signed   By: Oneil Devonshire M.D.   On: 09/23/2023 01:19   DG Chest Portable  1 View Result Date: 09/18/2023 CLINICAL DATA:  sob EXAM: PORTABLE CHEST 1 VIEW COMPARISON:  Chest x-ray 09/08/2023 FINDINGS: The heart and mediastinal contours are within normal limits. No focal consolidation. No pulmonary edema. No pleural effusion. No pneumothorax. No acute osseous abnormality. IMPRESSION: No active disease. Electronically Signed   By: Morgane  Naveau M.D.   On: 09/18/2023 18:31   DG Chest Port 1 View Result Date: 09/08/2023 CLINICAL DATA:  Chest pain and tachycardia. EXAM: PORTABLE CHEST 1 VIEW COMPARISON:  Chest x-ray 09/17/2021 FINDINGS: External pacer paddles are noted. The heart is normal in size. The mediastinal and hilar contours are normal. The lungs are clear. No infiltrates, effusions or edema. No pneumothorax. No pulmonary lesions. The bony thorax is intact. IMPRESSION: No acute cardiopulmonary findings. Electronically Signed   By: MYRTIS Stammer M.D.   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 Pending ablation at [facility] 10/30/23, multiple recent failed DCCV. Prior LVEF 45-50% in 08/2021.  -amiodarone  stopped on admit    -hyperthyroidism treatment per PCCM > PTU, solu-cortef, propranolol, IVF -home methimazole  on hold -Eliquis  for stroke prophylaxis  -anticipate given hyperthyroidism, DCCV will not likely be successful  -follow electrolytes closely  -decision regarding timing of ablation TBD   Secondary Hypercoagulable State  -continue Eliquis  5mg  BID, dose reviewed and appropriate by age / wt      For questions or updates, please contact [facility] Please consult [email or web address] for contact info under     Signed, Daphne Barrack, NP-C, AGACNP-BC [facility] - Electrophysiology  09/23/2023, 9:27 AM  I have seen, examined the patient, and reviewed the above assessment and plan.    HPI: Patient presented to the ED on 09/23/2023 with recurrence of atrial fibrillation.  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  Patient was admitted to the ICU for management.  He remains in atrial flutter with rapid rates.  At rest reports feeling okay.  General: Well developed, in no acute distress.  Neck: No JVD.  Cardiac: Tachycardic, regular Resp: Normal work of breathing.  Ext: No edema.  Neuro: No gross focal deficits.  Psych: Normal affect.   Assessment and Plan:  Patient has history of paroxysmal AF dating back several years. Had previously been on amiodarone . Highly symptomatic with rapid rates during episodes. He has subsequently developed hyperthyroidism.  Hyperthyroidism appears out of proportion to remote history of amiodarone  use.  Hyperthyroidism is likely the driving factor for his difficult to control atrial fibrillation.  Attempts to treat atrial fibrillation are unlikely to be successful until thyroid  can be adequately addressed.   #. Symptomatic paroxysmal atrial fibrillation:  #. Atrial flutter: #. Secondary hypercoagulable state due to AF:  -We had previously discussed trying to expedite catheter ablation given repeated ED admissions for atrial fibrillation.  Given new  concern for thyroid  storm, addressing his thyroid  disease is first priority.  Unlikely to have much success maintaining sinus rhythm until thyroid  can be adequately managed.  Ultimately, he would benefit from catheter ablation as his atrial fibrillation predates his thyroid  disease.  But we will wait until thyroid  issues have been adequately treated.  For now, we will try to rate control with beta-blockade and reattempt cardioversion when patient is closer to discharge. -Continue Eliquis  5 mg twice daily.   #. Hyperthyroidism:  - Avoid  amiodarone  for now.  Appreciate critical care management assistance in managing hyperthyroidism.  Thyroid  ultrasound is pending.  Ultimately, needs endocrinology evaluation and management.  Fonda Kitty, MD 09/23/2023 11:13 PM

## 2023-09-23 NOTE — H&P (Addendum)
 NAME:  Tommy Rowe, MRN:  [id number], DOB:  1955/05/22, LOS: 0 ADMISSION DATE:  09/23/2023, CONSULTATION DATE:  09/23/23 REFERRING MD:  Maree, CHIEF COMPLAINT:  afib rvr   History of Present Illness:  Tommy Eastridge is a 68 y.o. M with PMH significant for atrial fibrillation, anxiety, HL, HTN, LBBB and CAD status post PCI with DES to LAD in June 2023, and hyperthyroidism who is scheduled for an ablation at [facility] 10/30/23 but was admitted twice recently with paroxysmal atrial fibrillation/flutter  that failed cardioversion x3 but improved with amiodarone .  He was also recently diagnosed with hyperthyroidism and started on methimazole .    On 6/22 pt again developed RVR, he took three doses of home metoprolol , dose increased from 25,g to 50mg , however HR remained >140 so presented to the ED.  His BP was borderline low with MAP's in the 70's and pt c/o dyspnea and chest pain.  He also reports tremors and anxiety  Cardiology planning repeat cardioversion, PCCM consulted for admission in this setting   Pertinent  Medical History   has a past medical history of A-fib (HCC), Anxiety, HLD (hyperlipidemia), and HTN (hypertension).   Significant Hospital Events: Including procedures, antibiotic start and stop dates in addition to other pertinent events   6/23 admit with afib rvr and possible thyroid  storm  Interim History / Subjective:  Pt resting in bed in NAD  Objective    Blood pressure 99/78, pulse (!) 141, temperature 99.4 F (37.4 C), resp. rate (!) 21, height 6' (1.829 m), weight 89.8 kg, SpO2 98%.       No intake or output data in the 24 hours ending 09/23/23 0444 Filed Weights   09/23/23 0044  Weight: 89.8 kg    General:  well nourished M resting in bed in NAD  HEENT: MM pink/moist Neuro: alert and oriented CV: s1s2 tachycardic, irregular, no m/r/g PULM: clear bilaterally on RA GI: soft, non-tender  Extremities: warm/dry, no edema  Skin: no rashes or lesions   Resolved  problem list   Assessment and Plan    Atrial Flutter with RVR in the setting of hyperthyroidism and possible thyroid  storm HFrEF, CAD, HL Hypomagnesemia  Trop 19,  TSH <0.010, T4 2.95 EF 45-50% Grave's Disease possibly worsening with amiodarone  during recent admission -cardiology planning repeat DCCV at higher joules, continue metoprolol  50mg  q6hrs along with Eliquis  -admit to ICU  -start solucortef 100mg  q8hrs -PTU 100mg  q8hrs -1L LR -hold home tapazole   -trend troponin -replete and trend electrolytes  -has outpatient referral to endocrinology and ablation scheduled at [facility] next month  Anxiety -continue lexapro   Best Practice (right click and Reselect all SmartList Selections daily)   Diet/type: Regular consistency (see orders) DVT prophylaxis DOAC Pressure ulcer(s): N/A GI prophylaxis: N/A Lines: N/A Foley:  N/A Code Status:  full code Last date of multidisciplinary goals of care discussion [pt and wife updated at the bedside ]  Labs   CBC: Recent Labs  Lab 09/18/23 1700 09/19/23 0437 09/23/23 0050  WBC 4.5 4.2 9.1  NEUTROABS 2.5  --   --   HGB 14.3 13.7 14.0  HCT 41.0 40.1 40.5  MCV 92.8 92.6 93.3  PLT 134* 123* 146*    Basic Metabolic Panel: Recent Labs  Lab 09/18/23 1700 09/19/23 0437 09/23/23 0050 09/23/23 0201  NA 138 136 133*  --   K 4.3 4.2 3.9  --   CL 103 105 103  --   CO2 23 26 21*  --   GLUCOSE  114* 116* 128*  --   BUN 19 17 20   --   CREATININE 0.94 0.81 0.94  --   CALCIUM  9.3 8.4* 8.7*  --   MG 1.9 2.5*  --  1.7   GFR: Estimated Creatinine Clearance: 84.8 mL/min (by C-G formula based on SCr of 0.94 mg/dL). Recent Labs  Lab 09/18/23 1700 09/19/23 0437 09/23/23 0050  WBC 4.5 4.2 9.1    Liver Function Tests: Recent Labs  Lab 09/19/23 0437 09/23/23 0201  AST 19 16  ALT 21 20  ALKPHOS 46 40  BILITOT 1.2 1.6*  PROT 6.4* 6.6  ALBUMIN 3.5 3.4*   No results for input(s): LIPASE, AMYLASE in the last 168 hours. No  results for input(s): AMMONIA in the last 168 hours.  ABG No results found for: PHART, PCO2ART, PO2ART, HCO3, TCO2, ACIDBASEDEF, O2SAT   Coagulation Profile: No results for input(s): INR, PROTIME in the last 168 hours.  Cardiac Enzymes: No results for input(s): CKTOTAL, CKMB, CKMBINDEX, TROPONINI in the last 168 hours.  HbA1C: Hgb A1c MFr Bld  Date/Time Value Ref Range Status  09/17/2021 09:07 AM 5.7 (H) 4.8 - 5.6 % Final    Comment:    (NOTE) Pre diabetes:          5.7%-6.4%  Diabetes:              >6.4%  Glycemic control for   <7.0% adults with diabetes     CBG: No results for input(s): GLUCAP in the last 168 hours.  Review of Systems:   Please see the history of present illness. All other systems reviewed and are negative    Past Medical History:  He,  has a past medical history of A-fib (HCC), Anxiety, HLD (hyperlipidemia), and HTN (hypertension).   Surgical History:   Past Surgical History:  Procedure Laterality Date   CORONARY STENT INTERVENTION N/A 09/18/2021   Procedure: CORONARY STENT INTERVENTION;  Surgeon: Florencio Cara BIRCH, MD;  Location: [facility] INVASIVE CV LAB;  Service: Cardiovascular;  Laterality: N/A;   FACIAL FRACTURE SURGERY  1980   LEFT HEART CATH AND CORONARY ANGIOGRAPHY Left 04/11/2021   Procedure: LEFT HEART CATH AND CORONARY ANGIOGRAPHY;  Surgeon: Fernand Denyse LABOR, MD;  Location: [facility] INVASIVE CV LAB;  Service: Cardiovascular;  Laterality: Left;   LEFT HEART CATH AND CORONARY ANGIOGRAPHY N/A 09/18/2021   Procedure: LEFT HEART CATH AND CORONARY ANGIOGRAPHY;  Surgeon: Florencio Cara BIRCH, MD;  Location: [facility] INVASIVE CV LAB;  Service: Cardiovascular;  Laterality: N/A;   LUMBAR LAMINECTOMY/DECOMPRESSION MICRODISCECTOMY N/A 08/26/2019   Procedure: RIGHT L4-5 MICRODISCECTOMY;  Surgeon: Clois Fret, MD;  Location: [facility] ORS;  Service: Neurosurgery;  Laterality: N/A;     Social History:   reports that he quit smoking  about 35 years ago. His smoking use included cigarettes. He has been exposed to tobacco smoke. He has never used smokeless tobacco. He reports that he does not drink alcohol and does not use drugs.   Family History:  His family history includes COPD in his mother; Heart attack in his father; Valvular heart disease in his sister.   Allergies No Known Allergies   Home Medications  Prior to Admission medications   Medication Sig Start Date End Date Taking? Authorizing Provider  desonide (DESOWEN) 0.05 % ointment Apply 1 Application topically daily as needed.   Yes [provider]  doxylamine , Sleep, (UNISOM ) 25 MG tablet Take 12.5 mg by mouth at bedtime as needed for sleep.   Yes [provider]  dronedarone  (  MULTAQ ) 400 MG tablet Take 1 tablet (400 mg total) by mouth 2 (two) times daily with a meal. 09/19/23 10/19/23 Yes Shahmehdi, Seyed A, MD  ELIQUIS  5 MG TABS tablet Take 5 mg by mouth 2 (two) times daily. 08/17/21  Yes [provider]  escitalopram  (LEXAPRO ) 20 MG tablet Take 20 mg by mouth at bedtime. 11/05/16  Yes [provider]  methimazole  (TAPAZOLE ) 5 MG tablet Take 1 tablet (5 mg total) by mouth 3 (three) times daily. 09/05/23 10/05/23 Yes Maree Hue, MD  metoprolol  tartrate (LOPRESSOR ) 25 MG tablet Take 0.5 tablets (12.5 mg total) by mouth 2 (two) times daily. 09/19/23 10/19/23 Yes Shahmehdi, Adriana LABOR, MD  Multiple Vitamin (MULTIVITAMIN WITH MINERALS) TABS tablet Take 1 tablet by mouth every morning. Centrum Silver for Men 50+   Yes [provider]  rosuvastatin  (CRESTOR ) 20 MG tablet Take 20 mg by mouth at bedtime.   Yes [provider]  spironolactone  (ALDACTONE ) 25 MG tablet Take 0.5 tablets (12.5 mg total) by mouth every morning. 09/20/23 10/20/23 Yes Shahmehdi, Seyed A, MD  tamsulosin  (FLOMAX ) 0.4 MG CAPS capsule Take 1 capsule (0.4 mg total) by mouth daily. Patient taking differently: Take 0.4 mg by mouth every morning. 08/29/23  Yes  Francisca Redell BROCKS, MD     Critical care time:  45 minutes     CRITICAL CARE Performed by: Leita SAUNDERS Tayjon Halladay   Total critical care time: 45 minutes  Critical care time was exclusive of separately billable procedures and treating other patients.  Critical care was necessary to treat or prevent imminent or life-threatening deterioration.  Critical care was time spent personally by me on the following activities: development of treatment plan with patient and/or surrogate as well as nursing, discussions with consultants, evaluation of patient's response to treatment, examination of patient, obtaining history from patient or surrogate, ordering and performing treatments and interventions, ordering and review of laboratory studies, ordering and review of radiographic studies, pulse oximetry and re-evaluation of patient's condition.     Leita SAUNDERS Anwen Cannedy, PA-C [facility] See Amion for pager If no response to pager , please call [phone] until 7pm After 7:00 pm call Elink  [phone]

## 2023-09-23 NOTE — H&P (Signed)
 NAME:  Tommy Rowe, MRN:  [id number], DOB:  [birth year], LOS: 0 ADMISSION DATE:  09/23/2023, CONSULTATION DATE:  09/23/2023 REFERRING MD:  Dr Raford, CHIEF COMPLAINT:  Hypotension/Atrial Flutter  History of Present Illness:  68 y/o male with PMH for  A fib with RVR with several electric cardioversions which have failed on Eliquis , Hyperthyroidism, CAD s/p DES to LAD 08/2021, HTN, HLD, LBBB who presented after going back into A fib with RVR, took 25mg  Metoprolol  x 3 without success after speaking to Cardiology, the dose was then increased to 50mg  twice yesterday but this did not help either.  He therefore [facility] ED. Cardiology evaluation pending.  He c/o tenderness in his neck/Thyroid  and also c/p chest pains with deep inspiration.  He also c/o light headedness and dizziness but had no LOC.  There is concern that he may have Thyroid  storm from contribution from Amiodarone . His BP was low in ED and so he was not given any meds to slow the HR down.  He was noted to be in Atrial Flutter with rapid response in 140's.   TSH <0.010 and FT4 2.95.  Pertinent  Medical History  A-fib (HCC), Anxiety, HLD (hyperlipidemia), and HTN (hypertension).  Significant Hospital Events: Including procedures, antibiotic start and stop dates in addition to other pertinent events   6/23: admit to ICU  Interim History / Subjective:  N/a  Objective    Blood pressure 100/76, pulse (!) 139, temperature 99.4 F (37.4 C), resp. rate 20, height 6' (1.829 m), weight 89.8 kg, SpO2 94%.       No intake or output data in the 24 hours ending 09/23/23 0534 Filed Weights   09/23/23 0044  Weight: 89.8 kg    Examination: General: Alert Awake, NAD HENT: supple no icterus EOMI, mild tenderness with palpation of thyroid  Lungs: CTA no wheezes no rales Cardiovascular: irreg irreg, tachy no murmurs no rubs Abdomen: soft nt nd bs pos no guarding Extremities: no cyanosis, clubbing or edema Neuro: AAO x3, CN II to XII  grossly intact   Resolved problem list   Assessment and Plan  Atrial fib/flutter with RVR Patient will likely require electric cardioversion however, best time now versus after some therapy for his Thyroid  Storm Case was d/w Cards Dr Agapito and plan for cardioversion later today Thyroid  storm Currently besides his Fib/Flutter, no neuropsych symptoms (besides dizziness) and no GI or temperature issues (99.4) Likely precipitated by or contribution form Amiodarone  Will need Endocrine evaluation Start with PTU and steroids, may require Iodine solution Hypotension Secondary to and irregular and rapid heart rate along with Hyperthyroid Should see improvements with improved thyroid  and rapid hart rate CAD sp LAD stent by history Normal troponins Continue with Eliquis   Best Practice (right click and Reselect all SmartList Selections daily)   Diet/type: NPO w/ oral meds DVT prophylaxis DOAC Pressure ulcer(s): N/A GI prophylaxis: N/A Lines: N/A Foley:  N/A Code Status:  full code   Labs   CBC: Recent Labs  Lab 09/18/23 1700 09/19/23 0437 09/23/23 0050  WBC 4.5 4.2 9.1  NEUTROABS 2.5  --   --   HGB 14.3 13.7 14.0  HCT 41.0 40.1 40.5  MCV 92.8 92.6 93.3  PLT 134* 123* 146*    Basic Metabolic Panel: Recent Labs  Lab 09/18/23 1700 09/19/23 0437 09/23/23 0050 09/23/23 0201  NA 138 136 133*  --   K 4.3 4.2 3.9  --   CL 103 105 103  --   CO2 23  26 21*  --   GLUCOSE 114* 116* 128*  --   BUN 19 17 20   --   CREATININE 0.94 0.81 0.94  --   CALCIUM  9.3 8.4* 8.7*  --   MG 1.9 2.5*  --  1.7   GFR: Estimated Creatinine Clearance: 84.8 mL/min (by C-G formula based on SCr of 0.94 mg/dL). Recent Labs  Lab 09/18/23 1700 09/19/23 0437 09/23/23 0050  WBC 4.5 4.2 9.1    Liver Function Tests: Recent Labs  Lab 09/19/23 0437 09/23/23 0201  AST 19 16  ALT 21 20  ALKPHOS 46 40  BILITOT 1.2 1.6*  PROT 6.4* 6.6  ALBUMIN 3.5 3.4*   No results for input(s): LIPASE,  AMYLASE in the last 168 hours. No results for input(s): AMMONIA in the last 168 hours.  ABG No results found for: PHART, PCO2ART, PO2ART, HCO3, TCO2, ACIDBASEDEF, O2SAT   Coagulation Profile: No results for input(s): INR, PROTIME in the last 168 hours.  Cardiac Enzymes: No results for input(s): CKTOTAL, CKMB, CKMBINDEX, TROPONINI in the last 168 hours.  HbA1C: Hgb A1c MFr Bld  Date/Time Value Ref Range Status  09/17/2021 09:07 AM 5.7 (H) 4.8 - 5.6 % Final    Comment:    (NOTE) Pre diabetes:          5.7%-6.4%  Diabetes:              >6.4%  Glycemic control for   <7.0% adults with diabetes     CBG: No results for input(s): GLUCAP in the last 168 hours.  Review of Systems:   Palpitations, chest pain with deep breaths and thyroid  pain, light headed, dizziness, no LOC, positive palpitations  Past Medical History:  He,  has a past medical history of A-fib (HCC), Anxiety, HLD (hyperlipidemia), and HTN (hypertension).   Surgical History:   Past Surgical History:  Procedure Laterality Date   CORONARY STENT INTERVENTION N/A 09/18/2021   Procedure: CORONARY STENT INTERVENTION;  Surgeon: Florencio Cara BIRCH, MD;  Location: [facility] INVASIVE CV LAB;  Service: Cardiovascular;  Laterality: N/A;   FACIAL FRACTURE SURGERY  1980   LEFT HEART CATH AND CORONARY ANGIOGRAPHY Left 04/11/2021   Procedure: LEFT HEART CATH AND CORONARY ANGIOGRAPHY;  Surgeon: Fernand Denyse LABOR, MD;  Location: [facility] INVASIVE CV LAB;  Service: Cardiovascular;  Laterality: Left;   LEFT HEART CATH AND CORONARY ANGIOGRAPHY N/A 09/18/2021   Procedure: LEFT HEART CATH AND CORONARY ANGIOGRAPHY;  Surgeon: Florencio Cara BIRCH, MD;  Location: [facility] INVASIVE CV LAB;  Service: Cardiovascular;  Laterality: N/A;   LUMBAR LAMINECTOMY/DECOMPRESSION MICRODISCECTOMY N/A 08/26/2019   Procedure: RIGHT L4-5 MICRODISCECTOMY;  Surgeon: Clois Fret, MD;  Location: [facility] ORS;  Service: Neurosurgery;  Laterality:  N/A;     Social History:   reports that he quit smoking about 35 years ago. His smoking use included cigarettes. He has been exposed to tobacco smoke. He has never used smokeless tobacco. He reports that he does not drink alcohol and does not use drugs.   Family History:  His family history includes COPD in his mother; Heart attack in his father; Valvular heart disease in his sister.   Allergies No Known Allergies   Home Medications  Prior to Admission medications   Medication Sig Start Date End Date Taking? Authorizing Provider  desonide (DESOWEN) 0.05 % ointment Apply 1 Application topically daily as needed.   Yes [provider]  doxylamine , Sleep, (UNISOM ) 25 MG tablet Take 12.5 mg by mouth at bedtime as needed for sleep.  Yes [provider]  dronedarone  (MULTAQ ) 400 MG tablet Take 1 tablet (400 mg total) by mouth 2 (two) times daily with a meal. 09/19/23 10/19/23 Yes Shahmehdi, Seyed A, MD  ELIQUIS  5 MG TABS tablet Take 5 mg by mouth 2 (two) times daily. 08/17/21  Yes [provider]  escitalopram  (LEXAPRO ) 20 MG tablet Take 20 mg by mouth at bedtime. 11/05/16  Yes [provider]  methimazole  (TAPAZOLE ) 5 MG tablet Take 1 tablet (5 mg total) by mouth 3 (three) times daily. 09/05/23 10/05/23 Yes Maree Hue, MD  metoprolol  tartrate (LOPRESSOR ) 25 MG tablet Take 0.5 tablets (12.5 mg total) by mouth 2 (two) times daily. 09/19/23 10/19/23 Yes Shahmehdi, Adriana LABOR, MD  Multiple Vitamin (MULTIVITAMIN WITH MINERALS) TABS tablet Take 1 tablet by mouth every morning. Centrum Silver for Men 50+   Yes [provider]  rosuvastatin  (CRESTOR ) 20 MG tablet Take 20 mg by mouth at bedtime.   Yes [provider]  spironolactone  (ALDACTONE ) 25 MG tablet Take 0.5 tablets (12.5 mg total) by mouth every morning. 09/20/23 10/20/23 Yes Shahmehdi, Seyed A, MD  tamsulosin  (FLOMAX ) 0.4 MG CAPS capsule Take 1 capsule (0.4 mg total) by mouth daily. Patient taking  differently: Take 0.4 mg by mouth every morning. 08/29/23  Yes Francisca Redell BROCKS, MD     Critical care time: 72   The patient is critically ill with multiple organ system failure and requires high complexity decision making for assessment and support, frequent evaluation and titration of therapies, advanced monitoring, review of radiographic studies and interpretation of complex data.   Critical Care Time devoted to patient care services, exclusive of separately billable procedures, described in this note is 36 minutes.   Orlin Maree MD [facility] See Amion for pager  If no response to pager , please call [phone] until 7pm After 7:00 pm call Elink  [phone] 09/23/2023, 5:34 AM

## 2023-09-23 NOTE — Progress Notes (Addendum)
 eLink Physician-Brief Progress Note Patient Name: Tommy Rowe DOB: 1955/12/31 MRN: [id number]   Date of Service  09/23/2023  HPI/Events of Note  eICU Brief new admit note:  68 y.o. M with PMH significant for atrial fibrillation, anxiety, HL, HTN, LBBB and CAD status post PCI with DES to LAD in June 2023, 6/23 admit with afib rvr and possible thyroid  storm   Now in ICU for  Recurring A fib RVR, anxiety. Scheduled for cardioversion for 10/30/2023. Suspected recurring thyroid  storm.  Now complaining of chest pain, not new ongoing for last 72 hrs.  HFrEF Low magnesium  level.   Data: Reviewed Mag 1.7 LFT ok Cr normal. No acidosis. Trop now at 17 down from 19 CBC normal, no sepsis. TSH low at < 0.010 T4 elevated.  Camera: Chest pain, no diaphoresis. VS; HR 125 to 130, MAP is good. On room air. Discussed with patient and bed side RN. Asking for morphine .   eICU Interventions  Morphine  1 mg IV stat ordered. Looks non cardiac pain. Tropos were almost fine.  Keep Mag level > 2, K above 4. S/p thyroid  storm treatment from ED/ICU Stat EKG reviewed and compared to old showing LBBB, old. Prolonged qtc interval. On apixaban  already.  S/p storm rx.        Intervention Category Major Interventions: Arrhythmia - evaluation and management Intermediate Interventions: Pain - evaluation and management Evaluation Type: New Patient Evaluation  Jodelle ONEIDA Hutching 09/23/2023, 5:28 AM  05:51 Camera high alert. Planning for Cardioversion. Morphine  not helping. Can not give amiodarone   due to suspected thyroid  storm. Bed side Cards going to talk to Dr Loreli, CCM team to be there when doing cardioversion. eHUB RN has given Dr Loreli no to doc. And notified same.   06:22 Still having the chest pain. Request for more pain meds.  Cards says no to nitro doesn't want to tank his pressure.  Cards also says no cardioversion till after endocrine comes to see if they can get down his HR from their  side. - just got metoprolol  50 mg tablet for HR control.  - dilaudid  low dose once stat. Narcan prn for reversal if needed

## 2023-09-23 NOTE — ED Triage Notes (Signed)
 Pt complaining of his heart beating fast since yesterday. He called his cardiologist yesterday and they increased his Metoprolol  to 25 mg. That did not produce any results. Today they increased it to 50 mg, which did not do anything.

## 2023-09-23 NOTE — ED Notes (Signed)
 Patient was assisted to the bathroom for a bowel movement. Patient was able to walk without difficulty was being assisted. Patient is now resting back in bed.

## 2023-09-23 NOTE — Telephone Encounter (Signed)
 LM with pt's Wife letting her know that per Dr. Kennyth - I have cancelled his CT for tomorrow 6/24 and his ablation on 7/2 with Dr. Kennyth due to the pt being in ICU at this time.   We will be in touch to get him rescheduled after he is feeling better.

## 2023-09-23 NOTE — ED Notes (Signed)
 Patient was seen by intensivist and cardiologist at bedside.

## 2023-09-23 NOTE — Consult Note (Signed)
 Cardiology Consultation:   Patient ID: Tommy Rowe MRN: [id number]; DOB: 12-29-1955  Admit date: 09/23/2023 Date of Consult: 09/23/2023  Primary Care Provider: Darra Hamilton, PA-C [facility] Cardiologist: None  [facility] Electrophysiologist:  None    Patient Profile:   Tommy Rowe is a 68 y.o. male with a hx of atrial fibrillation, NSTEMI, HLD, HTN, hyperthyroidism, HFrEF, GAD, LBBB, CAD, insomnia who is being seen today for the evaluation of afib with RVR at the request of the ED.  History of Present Illness:   Tommy Rowe called the cardiology 6/22 evening and spoke with myself for A-fib with RVR.  He reported he was having heart rates in the 140s-150s on metoprolol  tartrate 25 mg approximately every 8 hours, per instruction of the cardiology team from the day prior.  Last evening, we increased his metoprolol  to tartrate to 50 mg every 6 hours, with plan to check his heart rates in the late evening.  Unfortunately, his heart rates remained elevated in the 140s, and thus he called back, and I recommended he come to the emergency room.  In the ED, he was found to be in atrial flutter with ventricular rates in the 140s.  He reports for the past few weeks he has felt diaphoretic.  He has had new diarrhea and tremors.  He has been feeling especially anxious.  His throat is tender to palpation, and he has been feeling it regularly due to soreness.  He has had palpitations for at least 1-2 weeks.  Review of his lab results, his TSH has become progressively more undetectable.  His free T4 has been rising.  Of note, he received IV amiodarone  a few weeks ago for A-fib with RVR, around the time he was diagnosed with Graves disease.  Past Medical History:  Diagnosis Date   A-fib (HCC)    Anxiety    HLD (hyperlipidemia)    HTN (hypertension)     Past Surgical History:  Procedure Laterality Date   CORONARY STENT INTERVENTION N/A 09/18/2021   Procedure: CORONARY STENT INTERVENTION;   Surgeon: Florencio Cara BIRCH, MD;  Location: [facility] INVASIVE CV LAB;  Service: Cardiovascular;  Laterality: N/A;   FACIAL FRACTURE SURGERY  1980   LEFT HEART CATH AND CORONARY ANGIOGRAPHY Left 04/11/2021   Procedure: LEFT HEART CATH AND CORONARY ANGIOGRAPHY;  Surgeon: Fernand Denyse LABOR, MD;  Location: [facility] INVASIVE CV LAB;  Service: Cardiovascular;  Laterality: Left;   LEFT HEART CATH AND CORONARY ANGIOGRAPHY N/A 09/18/2021   Procedure: LEFT HEART CATH AND CORONARY ANGIOGRAPHY;  Surgeon: Florencio Cara BIRCH, MD;  Location: [facility] INVASIVE CV LAB;  Service: Cardiovascular;  Laterality: N/A;   LUMBAR LAMINECTOMY/DECOMPRESSION MICRODISCECTOMY N/A 08/26/2019   Procedure: RIGHT L4-5 MICRODISCECTOMY;  Surgeon: Clois Fret, MD;  Location: [facility] ORS;  Service: Neurosurgery;  Laterality: N/A;     Home Medications:  Prior to Admission medications   Medication Sig Start Date End Date Taking? Authorizing Provider  desonide (DESOWEN) 0.05 % ointment Apply 1 Application topically daily as needed.   Yes [provider]  doxylamine , Sleep, (UNISOM ) 25 MG tablet Take 12.5 mg by mouth at bedtime as needed for sleep.   Yes [provider]  dronedarone  (MULTAQ ) 400 MG tablet Take 1 tablet (400 mg total) by mouth 2 (two) times daily with a meal. 09/19/23 10/19/23 Yes Shahmehdi, Seyed A, MD  ELIQUIS  5 MG TABS tablet Take 5 mg by mouth 2 (two) times daily. 08/17/21  Yes [provider]  escitalopram  (LEXAPRO ) 20 MG  tablet Take 20 mg by mouth at bedtime. 11/05/16  Yes [provider]  methimazole  (TAPAZOLE ) 5 MG tablet Take 1 tablet (5 mg total) by mouth 3 (three) times daily. 09/05/23 10/05/23 Yes Maree Hue, MD  metoprolol  tartrate (LOPRESSOR ) 25 MG tablet Take 0.5 tablets (12.5 mg total) by mouth 2 (two) times daily. 09/19/23 10/19/23 Yes Shahmehdi, Adriana LABOR, MD  Multiple Vitamin (MULTIVITAMIN WITH MINERALS) TABS tablet Take 1 tablet by mouth every morning. Centrum Silver for Men 50+   Yes  [provider]  rosuvastatin  (CRESTOR ) 20 MG tablet Take 20 mg by mouth at bedtime.   Yes [provider]  spironolactone  (ALDACTONE ) 25 MG tablet Take 0.5 tablets (12.5 mg total) by mouth every morning. 09/20/23 10/20/23 Yes Shahmehdi, Seyed A, MD  tamsulosin  (FLOMAX ) 0.4 MG CAPS capsule Take 1 capsule (0.4 mg total) by mouth daily. Patient taking differently: Take 0.4 mg by mouth every morning. 08/29/23  Yes Francisca Redell BROCKS, MD    Inpatient Medications: Scheduled Meds:  Continuous Infusions:  magnesium  sulfate bolus IVPB 2 g (09/23/23 0435)   PRN Meds: docusate sodium , polyethylene glycol  Allergies:   No Known Allergies  Social History:   Social History   Socioeconomic History   Marital status: Married    Spouse name: Not on file   Number of children: Not on file   Years of education: Not on file   Highest education level: Not on file  Occupational History   Not on file  Tobacco Use   Smoking status: Former    Current packs/day: 0.00    Types: Cigarettes    Quit date: 04/02/1988    Years since quitting: 35.4    Passive exposure: Past   Smokeless tobacco: Never  Vaping Use   Vaping status: Not on file  Substance and Sexual Activity   Alcohol use: No   Drug use: Never   Sexual activity: Not on file  Other Topics Concern   Not on file  Social History Narrative   Not on file   Social Drivers of Health   Financial Resource Strain: Not on file  Food Insecurity: No Food Insecurity (09/04/2023)   Hunger Vital Sign    Worried About Running Out of Food in the Last Year: Never true    Ran Out of Food in the Last Year: Never true  Transportation Needs: No Transportation Needs (09/04/2023)   PRAPARE - Administrator, Civil Service (Medical): No    Lack of Transportation (Non-Medical): No  Physical Activity: Not on file  Stress: Not on file  Social Connections: Moderately Integrated (09/04/2023)   Social Connection and Isolation Panel     Frequency of Communication with Friends and Family: More than three times a week    Frequency of Social Gatherings with Friends and Family: More than three times a week    Attends Religious Services: More than 4 times per year    Active Member of [employer] or Organizations: No    Attends Banker Meetings: Never    Marital Status: Married  Catering manager Violence: Not At Risk (09/04/2023)   Humiliation, Afraid, Rape, and Kick questionnaire    Fear of Current or Ex-Partner: No    Emotionally Abused: No    Physically Abused: No    Sexually Abused: No    Family History:   Family History  Problem Relation Age of Onset   COPD Mother    Heart attack Father    Valvular heart disease  Sister      ROS:   Review of Systems: [y] = yes, [ ]  = no      General: Weight gain [ ] ; Weight loss Davis.Dad ]; Anorexia Davis.Dad ]; Fatigue [ ] ; Fever [ ] ; Chills [ ] ; Weakness [ ]    Cardiac: Chest pain/pressure [ ] ; Resting SOB [ ] ; Exertional SOB [ ] ; Orthopnea [ ] ; Pedal Edema [ ] ; Palpitations Davis.Dad ]; Syncope [ ] ; Presyncope [ ] ; Paroxysmal nocturnal dyspnea [ ]    Pulmonary: Cough [ ] ; Wheezing [ ] ; Hemoptysis [ ] ; Sputum [ ] ; Snoring [ ]    GI: Vomiting [ ] ; Dysphagia [ ] ; Melena [ ] ; Hematochezia [ ] ; Heartburn [ ] ; Abdominal pain [ ] ; Constipation [ ] ; Diarrhea Davis.Dad ]; BRBPR [ ]    GU: Hematuria [ ] ; Dysuria [ ] ; Nocturia [ ]  Vascular: Pain in legs with walking [ ] ; Pain in feet with lying flat [ ] ; Non-healing sores [ ] ; Stroke [ ] ; TIA [ ] ; Slurred speech [ ] ;   Neuro: Headaches [ ] ; Vertigo [ ] ; Seizures [ ] ; Paresthesias [ ] ;Blurred vision [ ] ; Diplopia [ ] ; Vision changes [ ]    Ortho/Skin: Arthritis [ ] ; Joint pain [ ] ; Muscle pain [ ] ; Joint swelling [ ] ; Back Pain [ ] ; Rash [ ]    Psych: Depression [ ] ; Anxiety [ ]    Heme: Bleeding problems [ ] ; Clotting disorders [ ] ; Anemia [ ]    Endocrine: Diabetes [ ] ; Thyroid  dysfunction [ ]    Physical Exam/Data:   Vitals:   09/23/23 0345 09/23/23 0400 09/23/23  0415 09/23/23 0430  BP: 99/63 (!) 88/68 90/75 99/78   Pulse: (!) 141 (!) 141 (!) 138 (!) 141  Resp: 17 (!) 22 (!) 23 (!) 21  Temp:      SpO2: 96% 95% 97% 98%  Weight:      Height:       No intake or output data in the 24 hours ending 09/23/23 0435    09/23/2023   12:44 AM 09/18/2023    4:56 PM 09/04/2023    7:19 AM  Last 3 Weights  Weight (lbs) 198 lb 200 lb 205 lb  Weight (kg) 89.812 kg 90.719 kg 92.987 kg     Body mass index is 26.85 kg/m.  General:  Well nourished, well developed, anxious appearing, diaphoresis HEENT: normal Lymph: no adenopathy Neck: no JVD Endocrine:  No thryomegaly Vascular: No carotid bruits; FA pulses 2+ bilaterally without bruits  Cardiac:  normal S1, S2; tachycardic regular rhythm; no murmur  Lungs:  clear to auscultation bilaterally, no wheezing, rhonchi or rales  Abd: soft, nontender, no hepatomegaly  Ext: no edema Musculoskeletal:  No deformities, BUE and BLE strength normal and equal Skin: warm and dry  Neuro:  CNs 2-12 intact, no focal abnormalities noted Psych:  Normal affect   EKG:  The EKG was personally reviewed and demonstrates: Supraventricular tachycardia with left bundle branch pattern and ventricular rate 145 bpm Telemetry:  Telemetry was personally reviewed and demonstrates: SVT  Relevant CV Studies:  Echo (09/19/21): 1. Left ventricular ejection fraction, by estimation, is 45 to 50%. The  left ventricle has mildly decreased function. The left ventricle  demonstrates regional wall motion abnormalities (see scoring  diagram/findings for description). There is mild left  ventricular hypertrophy. Left ventricular diastolic parameters are  consistent with Grade I diastolic dysfunction (impaired relaxation). There  is hypokinesis of the left ventricular, mid-apical anterior wall.   2. Right ventricular systolic function is normal.  The right ventricular  size is normal.   3. The mitral valve is normal in structure. No evidence of mitral  valve  regurgitation. No evidence of mitral stenosis.   4. The aortic valve is normal in structure. Aortic valve regurgitation is  not visualized. No aortic stenosis is present.   Laboratory Data:  High Sensitivity Troponin:   Recent Labs  Lab 09/08/23 1832 09/18/23 1700 09/18/23 1954 09/23/23 0050 09/23/23 0201  TROPONINIHS 25* 24* 30* 19* 17     Chemistry Recent Labs  Lab 09/18/23 1700 09/19/23 0437 09/23/23 0050  NA 138 136 133*  K 4.3 4.2 3.9  CL 103 105 103  CO2 23 26 21*  GLUCOSE 114* 116* 128*  BUN 19 17 20   CREATININE 0.94 0.81 0.94  CALCIUM  9.3 8.4* 8.7*  GFRNONAA >60 >60 >60  ANIONGAP 12 5 9     Recent Labs  Lab 09/19/23 0437 09/23/23 0201  PROT 6.4* 6.6  ALBUMIN 3.5 3.4*  AST 19 16  ALT 21 20  ALKPHOS 46 40  BILITOT 1.2 1.6*   Hematology Recent Labs  Lab 09/18/23 1700 09/19/23 0437 09/23/23 0050  WBC 4.5 4.2 9.1  RBC 4.42 4.33 4.34  HGB 14.3 13.7 14.0  HCT 41.0 40.1 40.5  MCV 92.8 92.6 93.3  MCH 32.4 31.6 32.3  MCHC 34.9 34.2 34.6  RDW 12.1 12.1 12.1  PLT 134* 123* 146*   BNPNo results for input(s): BNP, PROBNP in the last 168 hours.  DDimer No results for input(s): DDIMER in the last 168 hours.  Radiology/Studies:  DG Chest 2 View Result Date: 09/23/2023 CLINICAL DATA:  Cardiac palpitations EXAM: CHEST - 2 VIEW COMPARISON:  09/18/2023 FINDINGS: Cardiac shadow is within normal limits. Lungs are well aerated bilaterally. No focal infiltrate or effusion is seen. No bony abnormality is noted. IMPRESSION: No active cardiopulmonary disease. Electronically Signed   By: Oneil Devonshire M.D.   On: 09/23/2023 01:19    Assessment and Plan:   Supraventricular tachycardia, most likely atrial flutter with 2: 1 conduction Graves' disease, c/f thyroid  storm HFrEF Presented with supraventricular tachycardia, which appears to be most likely atrial flutter with 2: 1 conduction in the setting of progressively worsened Graves' disease.  I suspect  that he had underlying Graves', which was exacerbated after he received amiodarone  a few weeks ago and attempted treatment of his atrial fibrillation.  His most recent symptoms including diarrhea, anxiety, hyperdefecation, tremors, and anxiety are concerning for thyroid  storm.  His atrial flutter has been difficult to rate control even with max dose metoprolol  tartrate 50 mg every 6 hours.  He is borderline hypotensive with systolic blood pressures in the 90s.  He is on methimazole , but would benefit from more aggressive treatment for his Graves' disease.  Given stability, he would benefit from ICU admission, likely with attempt at DCCV since we are limited for other rate control agents.   -Continue metoprolol  tartrate 50 mg every 6 hours -Would retrial DCCV, endorses compliance with apixaban  -Given prior failure of DCCV with 100 J X2 and 150 J X1, would recommend initial energy dose of 200 to 300 J. -tele -continue apixaban  -we will continue to follow     For questions or updates, please contact [facility] Please consult [email or web address] for contact info under     Signed, Prentice FORBES Netters, MD  09/23/2023 4:35 AM

## 2023-09-23 NOTE — TOC Initial Note (Signed)
 Transition of Care [facility]) - Initial/Assessment Note    Patient Details  Name: Tommy Rowe MRN: [id number] Date of Birth: 10/22/1955  Transition of [facility] CM/SW Contact:    [name] Phone Number: [phone] 09/23/2023, 3:17 PM  Clinical Narrative:    [facility] CSW met with patient at bedside. Patient stated that he lives at home with spouse. Patient stated that he does not have a history of HH services. Patient stated that he does not use any equipment. Patient stated that he has a scale at home. Patient stated that he drives and is currently working, self employed. Patient stated that he has a PCP. CSW explained that a hospital follow up will be scheduled closer towards dc. Patient agrees. Wife will provide transportation at dc.   TOC will continue following.                Expected Discharge Plan: Home/Self Care Barriers to Discharge: Continued Medical Work up   Patient Goals and CMS Choice            Expected Discharge Plan and Services       Living arrangements for the past 2 months: Single Family Home                                      Prior Living Arrangements/Services Living arrangements for the past 2 months: Single Family Home Lives with:: Spouse Patient language and need for interpreter reviewed:: Yes Do you feel safe going back to the place where you live?: Yes      Need for Family Participation in Patient Care: No (Comment) Care giver support system in place?: Yes (comment)      Activities of Daily Living      Permission Sought/Granted                  Emotional Assessment Appearance:: Appears stated age Attitude/Demeanor/Rapport: Engaged Affect (typically observed): Appropriate Orientation: : Oriented to Self, Oriented to Place, Oriented to  Time, Oriented to Situation Alcohol / Substance Use: Not Applicable Psych Involvement: No (comment)  Admission diagnosis:  Atrial fibrillation (HCC) [I48.91] Hyperthyroidism  [E05.90] Hyponatremia [E87.1] Elevated troponin [R79.89] Atrial flutter with rapid ventricular response (HCC) [I48.92] Patient Active Problem List   Diagnosis Date Noted   Atrial fibrillation (HCC) 09/23/2023   Atrial flutter with rapid ventricular response (HCC) 09/23/2023   Chronic combined systolic and diastolic CHF (congestive heart failure) (HCC) 09/18/2023   History of CAD (coronary artery disease) 09/18/2023   LBBB (left bundle branch block) 09/18/2023   GAD (generalized anxiety disorder) 09/18/2023   Insomnia 09/18/2023   Elevated troponin 09/18/2023   Thrombocytopenia (HCC) 09/08/2023   HFrEF (heart failure with moderately reduced ejection fraction) (HCC) 09/08/2023   Hyperthyroidism 09/05/2023   Paroxysmal atrial fibrillation with RVR (HCC) 09/04/2023   NSTEMI (non-ST elevated myocardial infarction) (HCC) 09/17/2021   Depression with anxiety 09/17/2021   Atrial fibrillation, chronic (HCC) 09/17/2021   Hyperlipidemia 09/17/2021   Essential hypertension 09/17/2021   Leukocytosis 09/17/2021   Shortness of breath 04/06/2021   Lumbar radiculopathy 08/26/2019   Anxiety 11/26/2016   Alternating exotropia 02/07/2016   Diplopia 02/07/2016   Hypertropia of left eye 02/07/2016   PCP:  Darra Hamilton, PA-C Pharmacy:   [id number] - [place], [place] - [address] [facility] [address] [place]  [address] Phone: [phone] Fax: [phone]  [facility] [address] [place] [address] Phone: [phone] Fax: [phone]     Social Drivers of Health (SDOH) Social History: SDOH Screenings   Food Insecurity: No Food Insecurity (09/04/2023)  Housing: Low Risk  (09/04/2023)  Transportation Needs: No Transportation Needs (09/04/2023)  Utilities: Not At Risk (09/04/2023)  Depression (PHQ2-9): Low Risk  (03/12/2022)  Social Connections: Moderately Integrated (09/04/2023)  Tobacco Use:  Medium Risk (09/23/2023)   SDOH Interventions:     Readmission Risk Interventions     No data to display

## 2023-09-24 ENCOUNTER — Ambulatory Visit

## 2023-09-24 ENCOUNTER — Inpatient Hospital Stay (HOSPITAL_COMMUNITY)

## 2023-09-24 ENCOUNTER — Telehealth (HOSPITAL_COMMUNITY): Payer: Self-pay | Admitting: Pharmacy Technician

## 2023-09-24 ENCOUNTER — Other Ambulatory Visit (HOSPITAL_COMMUNITY): Payer: Self-pay

## 2023-09-24 DIAGNOSIS — E785 Hyperlipidemia, unspecified: Secondary | ICD-10-CM | POA: Diagnosis not present

## 2023-09-24 DIAGNOSIS — I4892 Unspecified atrial flutter: Secondary | ICD-10-CM | POA: Diagnosis not present

## 2023-09-24 DIAGNOSIS — I10 Essential (primary) hypertension: Secondary | ICD-10-CM | POA: Diagnosis not present

## 2023-09-24 DIAGNOSIS — I340 Nonrheumatic mitral (valve) insufficiency: Secondary | ICD-10-CM

## 2023-09-24 LAB — CBC
HCT: 39.5 % (ref 39.0–52.0)
Hemoglobin: 13.7 g/dL (ref 13.0–17.0)
MCH: 32.5 pg (ref 26.0–34.0)
MCHC: 34.7 g/dL (ref 30.0–36.0)
MCV: 93.8 fL (ref 80.0–100.0)
Platelets: 126 10*3/uL — ABNORMAL LOW (ref 150–400)
RBC: 4.21 MIL/uL — ABNORMAL LOW (ref 4.22–5.81)
RDW: 12.4 % (ref 11.5–15.5)
WBC: 8.9 10*3/uL (ref 4.0–10.5)
nRBC: 0 % (ref 0.0–0.2)

## 2023-09-24 LAB — GLUCOSE, CAPILLARY
Glucose-Capillary: 134 mg/dL — ABNORMAL HIGH (ref 70–99)
Glucose-Capillary: 164 mg/dL — ABNORMAL HIGH (ref 70–99)
Glucose-Capillary: 141 mg/dL — ABNORMAL HIGH (ref 70–99)
Glucose-Capillary: 224 mg/dL — ABNORMAL HIGH (ref 70–99)

## 2023-09-24 LAB — BASIC METABOLIC PANEL WITH GFR
Anion gap: 5 (ref 5–15)
BUN: 20 mg/dL (ref 8–23)
CO2: 27 mmol/L (ref 22–32)
Calcium: 8.7 mg/dL — ABNORMAL LOW (ref 8.9–10.3)
Chloride: 102 mmol/L (ref 98–111)
Creatinine, Ser: 0.97 mg/dL (ref 0.61–1.24)
GFR, Estimated: >60 mL/min (ref >60)
Glucose, Bld: 150 mg/dL — ABNORMAL HIGH (ref 70–99)
Potassium: 4.8 mmol/L (ref 3.5–5.1)
Sodium: 134 mmol/L — ABNORMAL LOW (ref 135–145)

## 2023-09-24 LAB — PHOSPHORUS: Phosphorus: 2.6 mg/dL (ref 2.5–4.6)

## 2023-09-24 LAB — MAGNESIUM: Magnesium: 2.1 mg/dL (ref 1.7–2.4)

## 2023-09-24 LAB — T3, FREE: T3, Free: 4.5 pg/mL — ABNORMAL HIGH (ref 2.0–4.4)

## 2023-09-24 MED ORDER — PROPOFOL 10 MG/ML IV BOLUS
100.0000 mg | Freq: Once | INTRAVENOUS | Status: DC
  Filled 2023-09-24 (×2): qty 20

## 2023-09-24 MED ORDER — IODINE STRONG (LUGOLS) 5 % PO SOLN
0.4000 mL | Freq: Three times a day (TID) | ORAL | Status: DC
  Administered 2023-09-24 – 2023-09-27 (×11): 0.4 mL via ORAL
  Filled 2023-09-24 (×12): qty 0.4

## 2023-09-24 MED ORDER — PROPYLTHIOURACIL 50 MG PO TABS
200.0000 mg | ORAL_TABLET | Freq: Three times a day (TID) | ORAL | Status: DC
  Administered 2023-09-24 – 2023-09-25 (×3): 200 mg via ORAL
  Filled 2023-09-24 (×4): qty 4

## 2023-09-24 MED ORDER — ETOMIDATE 2 MG/ML IV SOLN
INTRAVENOUS | Status: AC
  Administered 2023-09-24: 15 mg via INTRAVENOUS
  Filled 2023-09-24: qty 10

## 2023-09-24 MED ORDER — TAMSULOSIN HCL 0.4 MG PO CAPS
0.4000 mg | ORAL_CAPSULE | Freq: Every day | ORAL | Status: DC
  Administered 2023-09-24 – 2023-10-04 (×11): 0.4 mg via ORAL
  Filled 2023-09-24 (×11): qty 1

## 2023-09-24 MED ORDER — FENTANYL CITRATE PF 50 MCG/ML IJ SOSY
100.0000 ug | PREFILLED_SYRINGE | Freq: Once | INTRAMUSCULAR | Status: AC
  Administered 2023-09-24: 100 ug via INTRAVENOUS

## 2023-09-24 MED ORDER — ETOMIDATE 2 MG/ML IV SOLN: 10.0000 mg | Freq: Once | INTRAVENOUS | Status: AC

## 2023-09-24 MED ORDER — SODIUM CHLORIDE 0.9 % IV SOLN: INTRAVENOUS | Status: AC

## 2023-09-24 MED ORDER — FENTANYL CITRATE PF 50 MCG/ML IJ SOSY
100.0000 ug | PREFILLED_SYRINGE | Freq: Once | INTRAMUSCULAR | Status: DC
  Filled 2023-09-24 (×2): qty 2

## 2023-09-24 NOTE — Progress Notes (Signed)
   NAME:  Tommy Rowe, MRN:  [id number], DOB:  April 25, 1955, LOS: 1 ADMISSION DATE:  09/23/2023, CONSULTATION DATE:  09/23/2023 REFERRING MD:  Dr Raford, CHIEF COMPLAINT:  Hypotension/Atrial Flutter  History of Present Illness:  67 y/o male with PMH for  A fib with RVR with several electric cardioversions which have failed on Eliquis , Hyperthyroidism, CAD s/p DES to LAD 08/2021, HTN, HLD, LBBB who presented after going back into A fib with RVR, took 25mg  Metoprolol  x 3 without success after speaking to Cardiology, the dose was then increased to 50mg  twice yesterday but this did not help either.  He therefore came to [facility] ED. Cardiology evaluation pending.  He c/o tenderness in his neck/Thyroid  and also c/p chest pains with deep inspiration.  He also c/o light headedness and dizziness but had no LOC.  There is concern that he may have Thyroid  storm from contribution from Amiodarone . His BP was low in ED and so he was not given any meds to slow the HR down.  He was noted to be in Atrial Flutter with rapid response in 140's.   TSH <0.010 and FT4 2.95.  Pertinent  Medical History  A-fib (HCC), Anxiety, HLD (hyperlipidemia), and HTN (hypertension).  Significant Hospital Events: Including procedures, antibiotic start and stop dates in addition to other pertinent events   6/23: admit to ICU  Interim History / Subjective:  No events. Denies symptoms Remains in RVR  Objective    Blood pressure 96/69, pulse (!) 142, temperature 98.1 F (36.7 C), temperature source Oral, resp. rate 18, height 6' (1.829 m), weight 86 kg, SpO2 95%.        Intake/Output Summary (Last 24 hours) at 09/24/2023 0735 Last data filed at 09/23/2023 0900 Gross per 24 hour  Intake 1 ml  Output --  Net 1 ml   Filed Weights   09/23/23 0044 09/23/23 0600  Weight: 89.8 kg 86 kg    Examination: No distress Ext warm Aox3 Appears euvolemic Lungs pretty clear  Labs look fine  Resolved problem list   Assessment and  Plan  Hyperthyroidism-driven Afib/RVR- amio also contributed but sounds like thyroid  issues predated.  Symptomatically feels better but remains in RVR.  Thyroid  US  benign. 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

## 2023-09-24 NOTE — Telephone Encounter (Signed)
 Patient Product/process development scientist completed.    The patient is insured through [facility]. Patient has Medicare and is not eligible for a copay card, but may be able to apply for patient assistance or Medicare RX Payment Plan (Patient Must reach out to their plan, if eligible for payment plan), if available.    Ran test claim for Entresto 24-26 mg and the current 30 day co-pay is $47.00.  Ran test claim for Farxiga 10 mg and the current 30 day co-pay is $47.00.  Ran test claim for Jardiance 10 mg and the current 30 day co-pay is $47.00.  Ran test claim for dofetilide (Tikosyn) 500 mcg and the current 30 day co-pay is $47.00.  This test claim was processed through [facility]- copay amounts may vary at other pharmacies due to pharmacy/plan contracts, or as the patient moves through the different stages of their insurance plan.     Reyes Sharps, CPHT Pharmacy Technician III Certified Patient Advocate [facility] Patient Advocate Team Direct Number: [phone]  Fax: [phone]

## 2023-09-24 NOTE — Plan of Care (Signed)
  Problem: Education: Goal: Knowledge of General Education information will improve Description: Including pain rating scale, medication(s)/side effects and non-pharmacologic comfort measures Outcome: Progressing   Problem: Health Behavior/Discharge Planning: Goal: Ability to manage health-related needs will improve Outcome: Progressing   Problem: Clinical Measurements: Goal: Ability to maintain clinical measurements within normal limits will improve Outcome: Progressing Goal: Will remain free from infection Outcome: Progressing Goal: Diagnostic test results will improve Outcome: Progressing Goal: Respiratory complications will improve Outcome: Progressing Goal: Cardiovascular complication will be avoided Outcome: Not Progressing   Problem: Activity: Goal: Risk for activity intolerance will decrease Outcome: Not Progressing   Problem: Nutrition: Goal: Adequate nutrition will be maintained Outcome: Progressing   Problem: Coping: Goal: Level of anxiety will decrease Outcome: Progressing   Problem: Elimination: Goal: Will not experience complications related to bowel motility Outcome: Progressing Goal: Will not experience complications related to urinary retention Outcome: Progressing   Problem: Pain Managment: Goal: General experience of comfort will improve and/or be controlled Outcome: Progressing   Problem: Safety: Goal: Ability to remain free from injury will improve Outcome: Progressing   Problem: Skin Integrity: Goal: Risk for impaired skin integrity will decrease Outcome: Progressing   Problem: Education: Goal: Ability to describe self-care measures that may prevent or decrease complications (Diabetes Survival Skills Education) will improve Outcome: Progressing Goal: Individualized Educational Video(s) Outcome: Progressing   Problem: Coping: Goal: Ability to adjust to condition or change in health will improve Outcome: Progressing   Problem: Fluid  Volume: Goal: Ability to maintain a balanced intake and output will improve Outcome: Progressing   Problem: Health Behavior/Discharge Planning: Goal: Ability to identify and utilize available resources and services will improve Outcome: Progressing Goal: Ability to manage health-related needs will improve Outcome: Progressing   Problem: Metabolic: Goal: Ability to maintain appropriate glucose levels will improve Outcome: Progressing   Problem: Nutritional: Goal: Maintenance of adequate nutrition will improve Outcome: Progressing Goal: Progress toward achieving an optimal weight will improve Outcome: Progressing   Problem: Skin Integrity: Goal: Risk for impaired skin integrity will decrease Outcome: Progressing   Problem: Tissue Perfusion: Goal: Adequacy of tissue perfusion will improve Outcome: Progressing   Problem: Education: Goal: Knowledge of disease or condition will improve Outcome: Progressing   Problem: Education: Goal: Understanding of medication regimen will improve Outcome: Progressing   Problem: Education: Goal: Individualized Educational Video(s) Outcome: Progressing   Problem: Activity: Goal: Ability to tolerate increased activity will improve Outcome: Progressing   Problem: Cardiac: Goal: Ability to achieve and maintain adequate cardiopulmonary perfusion will improve Outcome: Not Progressing   Problem: Health Behavior/Discharge Planning: Goal: Ability to safely manage health-related needs after discharge will improve Outcome: Progressing  Patient in aflutter cardioversion today unsuccessful

## 2023-09-24 NOTE — Progress Notes (Addendum)
 Patient Name: Tommy Rowe Date of Encounter: 09/24/2023  Primary Cardiologist: None Electrophysiologist: None  Interval Summary   The patient is doing well today.  At this time, the patient denies chest pain, shortness of breath, or any new concerns.  Vital Signs    Vitals:   09/24/23 0300 09/24/23 0331 09/24/23 0400 09/24/23 0500  BP: 93/81  100/76 101/82  Pulse:      Resp: 18  20 18   Temp:  98.1 F (36.7 C)    TempSrc:  Oral    SpO2: 95%  96% 97%  Weight:      Height:        Intake/Output Summary (Last 24 hours) at 09/24/2023 0651 Last data filed at 09/23/2023 0900 Gross per 24 hour  Intake 1 ml  Output --  Net 1 ml   Filed Weights   09/23/23 0044 09/23/23 0600  Weight: 89.8 kg 86 kg    Physical Exam    GEN- The patient is well appearing, alert and oriented x 3 today.   Lungs- Clear to ausculation bilaterally, normal work of breathing Cardiac- Regular rate and rhythm, no murmurs, rubs or gallops GI- soft, NT, ND, + BS Extremities- no clubbing or cyanosis. No edema  Telemetry    AFL 135-145 bpm  (personally reviewed)  Hospital Course    Tommy Rowe is a 68 y.o. male with PMH AF/AFL, CAD s/p DES to LAD 08/2021, HTN, HLD, LBBB, hyperthyroidism & anxiety admitted for [facility]. Evidence of hyperthyroid / thyroid  storm.    Assessment & Plan    Symptomatic Paroxysmal Atrial Fibrillation / Atrial Flutter  On amiodarone  PTA. Pending ablation at [facility] 10/30/23, multiple recent failed DCCV. Prior LVEF 45-50% in 08/2021. LVEF 09/23/23 20-25% -primary treatment is controlling thyroid   -plan for TEE / DCCV > assess QTc, then tikosyn loading (if amio level low enough to start) -not a candidate for amiodarone  -ultimately will benefit from catheter ablation as his AF pre-dates his thyroid  issues, timing to be determined  -continue propranolol  -hyperthyroidism treatment per PCCM, appreciate assistance  -monitor electrolytes  Secondary Hypercoagulable State  -continue  Eliquis  5mg  BID, dose reviewed and appropriate by age / wt -pt reports no missed doses   Hyperthyroidism  -per PCCM -will need outpatient endocrine close follow up   For questions or updates, please contact [facility] Please consult [email or web address] for contact info under     Signed, Daphne Barrack, NP-C, AGACNP-BC [facility] - Electrophysiology  09/24/2023, 9:50 AM  I have seen, examined the patient, and reviewed the above assessment and plan.    Interval:  No acute overnight events. Patient reports feeling relatively well. No new or acute complaints. Remains in AFL with rapid rates on tele.   General: Well developed, in no acute distress.  Neck: No JVD.  Cardiac: Tachycardic, regular rhythm.  Resp: Normal work of breathing.  Ext: No edema.  Neuro: No gross focal deficits.  Psych: Normal affect.   Assessment and Plan:  Patient has history of paroxysmal AF dating back several years. Had previously been on amiodarone . Highly symptomatic with rapid rates during episodes. He has subsequently developed hyperthyroidism.  Hyperthyroidism appears out of proportion to remote history of amiodarone  use.  Hyperthyroidism is likely the driving factor for his difficult to control atrial fibrillation.  Attempts to treat atrial fibrillation are unlikely to be successful until thyroid  can be adequately addressed.   #. Symptomatic paroxysmal atrial fibrillation:  #. Atrial flutter: #. Secondary hypercoagulable state due  to AF:  -We had previously discussed trying to expedite catheter ablation given repeated ED admissions for atrial fibrillation.  Given new concern for thyroid  storm, addressing his thyroid  disease is first priority.  Unlikely to have much success maintaining sinus rhythm until thyroid  can be adequately managed.  Ultimately, he would benefit from catheter ablation as his atrial fibrillation predates his thyroid  disease.  But we will wait until thyroid  issues have been  adequately treated.   -For now, we will try to rate control with beta-blockade. Cardioversion today. If Qtc in sinus is normal then likely pursue Tikosyn loading as bridge to ablation. Check amiodarone  level in the interim.  -Continue Eliquis  5 mg twice daily.   #. Hyperthyroidism:  - Avoid amiodarone  for now.  Appreciate critical care management assistance in managing hyperthyroidism.  Thyroid  ultrasound is pending.  Ultimately, needs endocrinology evaluation and management.  Fonda Kitty, MD, [facility], [facility] Cardiac Electrophysiology

## 2023-09-24 NOTE — Procedures (Signed)
 Sedation for TEE and cardioversion  Time out obtained  ETCO2/pulse ox/tele all operational.  Suction and ambu bag at bedside.   Pre-sedation vitals  HR 145 (flutter) rr 18 sats 99% 2 lpm ETCO2 32   Pt administered 100 mcg IV fent  Followed initially by 10 mg IV etomidate .   Goal sedation achieved On-going telemetry/pulse ox and end tidal CO2 monitored w/ Pulse ox mid 90s, ETCO2 (highest was 36) RR as low as 12 TEE completed  Then defib X 1.  Did have apnea alarm X 1 < 20 seconds but never saw increased ETCO2, decrease in HR and desaturation  Pt shortly after started to reach for bite block.  At this point he had converted to af w/ RVR  I gave another 5 mg etomidate  IV. We proceeded w/ 2 more attempts at defibrillation as carried out by cardiology   We did have brief ~ 45 -90 sec of NSR w/ runs of atrial tach. As pt began to wake up rhythm became more irregular and then transitioned back to flutter in 140s.   I remained at bedside post procedure.  At time of completion HR still 146 RR 16 sats 93% RR 14 ETCO2 32. Pt awake, talking, knew staffs name but had no recollection of procedure.   Tolerated sedation well.    He was fully awake around 4:38

## 2023-09-24 NOTE — Progress Notes (Signed)
 0730 patient alert on room air able to make all needs known sitting in chair in a flutter 147. Patient reports having dizziness at times.

## 2023-09-24 NOTE — Progress Notes (Signed)
 Advanced Heart Failure Rounding Note  Cardiologist: None  Chief Complaint: A Flutter RVR Hypothyroidism Subjective:   Remains in  A Flutter RVR   Complaining of dizziness. Denies SOB.    Objective:   Weight Range: 86 kg Body mass index is 25.71 kg/m.   Vital Signs:   Temp:  [97.5 F (36.4 C)-98.1 F (36.7 C)] 97.5 F (36.4 C) (06/24 0742) Pulse Rate:  [140-142] 142 (06/23 2148) Resp:  [16-20] 18 (06/24 0500) BP: (89-113)/(63-85) 90/68 (06/24 1000) SpO2:  [90 %-100 %] 96 % (06/24 1000) Last BM Date : 09/23/23  Weight change: Filed Weights   09/23/23 0044 09/23/23 0600  Weight: 89.8 kg 86 kg    Intake/Output:   Intake/Output Summary (Last 24 hours) at 09/24/2023 1022 Last data filed at 09/24/2023 0900 Gross per 24 hour  Intake 30 ml  Output --  Net 30 ml      Physical Exam   General:   No resp difficulty Neck: supple. no JVD.  Cor: PMI nondisplaced. Tachy Regular rate & rhythm. No rubs, gallops or murmurs. Lungs: clear Abdomen: soft, nontender, nondistended.  Extremities: no cyanosis, clubbing, rash, edema Neuro: alert & oriented x3    Telemetry    A flutter 130-140s.   EKG      Labs    CBC Recent Labs    09/23/23 0558 09/24/23 0316  WBC 9.3 8.9  HGB 12.8* 13.7  HCT 37.3* 39.5  MCV 94.7 93.8  PLT 118* 126*   Basic Metabolic Panel Recent Labs    93/76/74 0558 09/24/23 0316  NA 135 134*  K 4.0 4.8  CL 103 102  CO2 23 27  GLUCOSE 129* 150*  BUN 21 20  CREATININE 0.88 0.97  CALCIUM  8.7* 8.7*  MG 2.2 2.1  PHOS 3.4 2.6   Liver Function Tests Recent Labs    09/23/23 0201  AST 16  ALT 20  ALKPHOS 40  BILITOT 1.6*  PROT 6.6  ALBUMIN 3.4*   No results for input(s): LIPASE, AMYLASE in the last 72 hours. Cardiac Enzymes No results for input(s): CKTOTAL, CKMB, CKMBINDEX, TROPONINI in the last 72 hours.  BNP: BNP (last 3 results) Recent Labs    09/04/23 0727  BNP 154.9*    ProBNP (last 3 results) No  results for input(s): PROBNP in the last 8760 hours.   D-Dimer No results for input(s): DDIMER in the last 72 hours. Hemoglobin A1C Recent Labs    09/23/23 1608  HGBA1C 5.0   Fasting Lipid Panel No results for input(s): CHOL, HDL, LDLCALC, TRIG, CHOLHDL, LDLDIRECT in the last 72 hours. Thyroid  Function Tests Recent Labs    09/23/23 0201  TSH <0.010*  T3FREE 4.5*    Other results:   Imaging    ECHOCARDIOGRAM COMPLETE Result Date: 09/23/2023    ECHOCARDIOGRAM REPORT   Patient Name:   MARVEN VELEY Date of Exam: 09/23/2023 Medical Rec #:  [id number]      Height:       72.0 in Accession #:    [id number]     Weight:       189.6 lb Date of Birth:  06-Oct-1955     BSA:          2.083 m Patient Age:    67 years       BP:           104/84 mmHg Patient Gender: M              HR:  139 bpm. Exam Location:  Inpatient Procedure: 2D Echo, Cardiac Doppler, Color Doppler and Intracardiac            Opacification Agent (Both Spectral and Color Flow Doppler were            utilized during procedure). Indications:    I50.40* Unspecified combined systolic (congestive) and diastolic                 (congestive) heart failure  History:        Patient has prior history of Echocardiogram examinations, most                 recent 09/19/2021. Previous Myocardial Infarction and CAD,                 Abnormal ECG, Arrythmias:Atrial Fibrillation, Atrial Flutter and                 LBBB; Signs/Symptoms:Shortness of Breath, Dyspnea and                 Dizziness/Lightheadedness.  Sonographer:    Ellouise Mose RDCS Referring Phys: [id number] Linley Moskal D Jaymz Traywick IMPRESSIONS  1. Left ventricular ejection fraction, by estimation, is 20 to 25%. The left ventricle has severely decreased function. The left ventricle demonstrates global hypokinesis. The left ventricular internal cavity size was moderately to severely dilated. Left ventricular diastolic parameters are indeterminate.  2. Right ventricular systolic function  is moderately reduced. The right ventricular size is mildly enlarged. There is mildly elevated pulmonary artery systolic pressure. The estimated right ventricular systolic pressure is 42.2 mmHg.  3. The mitral valve is degenerative. Moderate to severe mitral valve regurgitation. Mild mitral stenosis. Restrictive posterior leafleft, central MR jet. Recommend repeat echo when better compensated.  4. The aortic valve is tricuspid. There is mild calcification of the aortic valve. Aortic valve regurgitation is trivial. Aortic valve sclerosis is present, with no evidence of aortic valve stenosis.  5. The inferior vena cava is dilated in size with <50% respiratory variability, suggesting right atrial pressure of 15 mmHg. Conclusion(s)/Recommendation(s): Cannot exclude the possibility of apical thrombus. Large amount of contrast hang up in the apex. FINDINGS  Left Ventricle: Left ventricular ejection fraction, by estimation, is 20 to 25%. The left ventricle has severely decreased function. The left ventricle demonstrates global hypokinesis. Definity contrast agent was given IV to delineate the left ventricular endocardial borders. The left ventricular internal cavity size was moderately to severely dilated. There is no left ventricular hypertrophy. Left ventricular diastolic parameters are indeterminate. Right Ventricle: The right ventricular size is mildly enlarged. No increase in right ventricular wall thickness. Right ventricular systolic function is moderately reduced. There is mildly elevated pulmonary artery systolic pressure. The tricuspid regurgitant velocity is 2.61 m/s, and with an assumed right atrial pressure of 15 mmHg, the estimated right ventricular systolic pressure is 42.2 mmHg. Left Atrium: Left atrial size was normal in size. Right Atrium: Right atrial size was normal in size. Pericardium: There is no evidence of pericardial effusion. Mitral Valve: The mitral valve is degenerative in appearance. Moderate  to severe mitral valve regurgitation. Mild mitral valve stenosis. MV peak gradient, 9.5 mmHg. The mean mitral valve gradient is 3.0 mmHg. Tricuspid Valve: The tricuspid valve is normal in structure. Tricuspid valve regurgitation is mild . No evidence of tricuspid stenosis. Aortic Valve: The aortic valve is tricuspid. There is mild calcification of the aortic valve. Aortic valve regurgitation is trivial. Aortic valve sclerosis is present, with no evidence of aortic valve  stenosis. Pulmonic Valve: The pulmonic valve was normal in structure. Pulmonic valve regurgitation is not visualized. No evidence of pulmonic stenosis. Aorta: The aortic root and ascending aorta are structurally normal, with no evidence of dilitation. Venous: The inferior vena cava is dilated in size with less than 50% respiratory variability, suggesting right atrial pressure of 15 mmHg. IAS/Shunts: No atrial level shunt detected by color flow Doppler.  LEFT VENTRICLE PLAX 2D LVIDd:         5.30 cm      Diastology LVIDs:         4.80 cm      LV e' medial:    4.68 cm/s LV PW:         1.10 cm      LV E/e' medial:  25.9 LV IVS:        1.20 cm      LV e' lateral:   14.90 cm/s LVOT diam:     2.20 cm      LV E/e' lateral: 8.1 LV SV:         45 LV SV Index:   22 LVOT Area:     3.80 cm  LV Volumes (MOD) LV vol d, MOD A2C: 185.0 ml LV vol d, MOD A4C: 156.0 ml LV vol s, MOD A2C: 125.0 ml LV vol s, MOD A4C: 128.0 ml LV SV MOD A2C:     60.0 ml LV SV MOD A4C:     156.0 ml LV SV MOD BP:      45.4 ml RIGHT VENTRICLE             IVC RV S prime:     11.20 cm/s  IVC diam: 2.60 cm TAPSE (M-mode): 1.2 cm LEFT ATRIUM             Index        RIGHT ATRIUM           Index LA diam:        4.10 cm 1.97 cm/m   RA Area:     12.20 cm LA Vol (A2C):   52.9 ml 25.40 ml/m  RA Volume:   27.30 ml  13.11 ml/m LA Vol (A4C):   50.6 ml 24.30 ml/m LA Biplane Vol: 56.3 ml 27.03 ml/m  AORTIC VALVE LVOT Vmax:   86.20 cm/s LVOT Vmean:  57.900 cm/s LVOT VTI:    0.119 m  AORTA Ao Root  diam: 3.65 cm Ao Asc diam:  3.70 cm MITRAL VALVE                TRICUSPID VALVE MV Area (PHT): 5.27 cm     TR Peak grad:   27.2 mmHg MV Area VTI:   1.94 cm     TR Vmax:        261.00 cm/s MV Peak grad:  9.5 mmHg MV Mean grad:  3.0 mmHg     SHUNTS MV Vmax:       1.54 m/s     Systemic VTI:  0.12 m MV Vmean:      83.4 cm/s    Systemic Diam: 2.20 cm MV Decel Time: 144 msec MV E velocity: 121.00 cm/s Morene Brownie Electronically signed by Morene Brownie Signature Date/Time: 09/23/2023/3:27:26 PM    Final    US  THYROID  Result Date: 09/23/2023 CLINICAL DATA:  Hyperthyroid.  Thyrotoxicosis. EXAM: THYROID  ULTRASOUND TECHNIQUE: Ultrasound examination of the thyroid  gland and adjacent soft tissues was performed. COMPARISON:  None Available. FINDINGS: Parenchymal Echotexture: Normal Isthmus: 0.5 cm  Right lobe: 4.6 x 2.4 x 1.8 cm Left lobe: 5.2 x 1.5 x 1.8 cm _________________________________________________________ Estimated total number of nodules >/= 1 cm: 0 Number of spongiform nodules >/=  2 cm not described below (TR1): 0 Number of mixed cystic and solid nodules >/= 1.5 cm not described below (TR2): 0 _________________________________________________________ No discrete nodules are seen within the thyroid  gland. No enlarged or abnormal appearing lymph nodes are identified. IMPRESSION: Normal thyroid  ultrasound. Electronically Signed   By: Marcey Moan M.D.   On: 09/23/2023 14:23     Medications:     Scheduled Medications:  apixaban   5 mg Oral BID   Chlorhexidine  Gluconate Cloth  6 each Topical Daily   escitalopram   20 mg Oral QHS   fentaNYL  (SUBLIMAZE ) injection  100 mcg Intravenous Once   folic acid  1 mg Oral Daily   hydrocortisone sod succinate (SOLU-CORTEF) inj  100 mg Intravenous Q8H   insulin aspart  0-15 Units Subcutaneous TID WC   insulin aspart  0-5 Units Subcutaneous QHS   Iodine Strong (Lugols)  0.4 mL Oral TID   melatonin  3 mg Oral QHS   propofol   100 mg Intravenous Once    propranolol  40 mg Oral QID   propylthiouracil  200 mg Oral Q8H   rosuvastatin   20 mg Oral QHS   tamsulosin   0.4 mg Oral Daily   thiamine  100 mg Oral Daily    Infusions:  sodium chloride       PRN Medications: docusate sodium , HYDROmorphone  (DILAUDID ) injection, naLOXone (NARCAN)  injection, polyethylene glycol    Patient Profile   Admitted with A flutter RVR and Thyroid  Storm  Assessment/Plan  1. A flutter  RVR Followed by [facility], Dr Debby , EP and was seen 09/13/23. Considering ablation in July or August.  Discussed previous AA. We are trying to find out.  He is unsure but confirmed Amio and Multaq .    He has had multiple cardioversion. Failed to covert at 100J, 150 J.  Has also had ablation x3.  - Continue eliquis  5 mg twice a day . -EP appreciated.  - Continue propanalol.  - Plan TEE/DC-CV today.   2 . Thyroid  Storm  TSH, undetectable.  T4, 2.95  Per CCM. On soldumedrol, PTU, and Propanalol. Will need outpatient follow up with Endocrinology   3. Chronic HFmEF, NICM ---> Acute HFrEf  Suspect tachy mediated.  Echo LVEF down to 20-25% with smoke. Will need TEE today. He has been on Eliquis . Appears euvolemic.  GDMT limited with soft BP.    Informed Consent   Shared Decision Making/Informed Consent   The risks [stroke, cardiac arrhythmias rarely resulting in the need for a temporary or permanent pacemaker, skin irritation or burns, esophageal damage, perforation (1:10,000 risk), bleeding, pharyngeal hematoma as well as other potential complications associated with conscious sedation including aspiration, arrhythmia, respiratory failure and death], benefits (treatment guidance, restoration of normal sinus rhythm, diagnostic support) and alternatives of a transesophageal echocardiogram guided cardioversion were discussed in detail with Mr. Hume and he is willing to proceed.        Length of Stay: 1  Orris Perin, NP  09/24/2023, 10:22 AM  Advanced Heart  Failure Team Pager [phone] (M-F; 7a - 5p)  Please contact [facility] for night-coverage after hours (5p -7a ) and weekends on [email or web address]

## 2023-09-25 DIAGNOSIS — Z79899 Other long term (current) drug therapy: Secondary | ICD-10-CM

## 2023-09-25 LAB — BASIC METABOLIC PANEL WITH GFR
Anion gap: 8 (ref 5–15)
Anion gap: 9 (ref 5–15)
BUN: 32 mg/dL — ABNORMAL HIGH (ref 8–23)
BUN: 29 mg/dL — ABNORMAL HIGH (ref 8–23)
CO2: 23 mmol/L (ref 22–32)
CO2: 25 mmol/L (ref 22–32)
Calcium: 8.8 mg/dL — ABNORMAL LOW (ref 8.9–10.3)
Calcium: 9.1 mg/dL (ref 8.9–10.3)
Chloride: 103 mmol/L (ref 98–111)
Chloride: 104 mmol/L (ref 98–111)
Creatinine, Ser: 0.86 mg/dL (ref 0.61–1.24)
Creatinine, Ser: 0.85 mg/dL (ref 0.61–1.24)
GFR, Estimated: >60 mL/min (ref >60)
GFR, Estimated: >60 mL/min (ref >60)
Glucose, Bld: 176 mg/dL — ABNORMAL HIGH (ref 70–99)
Glucose, Bld: 134 mg/dL — ABNORMAL HIGH (ref 70–99)
Potassium: 4.4 mmol/L (ref 3.5–5.1)
Potassium: 4.5 mmol/L (ref 3.5–5.1)
Sodium: 134 mmol/L — ABNORMAL LOW (ref 135–145)
Sodium: 138 mmol/L (ref 135–145)

## 2023-09-25 LAB — CBC
HCT: 39.3 % (ref 39.0–52.0)
Hemoglobin: 13.4 g/dL (ref 13.0–17.0)
MCH: 31.5 pg (ref 26.0–34.0)
MCHC: 34.1 g/dL (ref 30.0–36.0)
MCV: 92.5 fL (ref 80.0–100.0)
Platelets: 143 10*3/uL — ABNORMAL LOW (ref 150–400)
RBC: 4.25 MIL/uL (ref 4.22–5.81)
RDW: 12.4 % (ref 11.5–15.5)
WBC: 8.5 10*3/uL (ref 4.0–10.5)
nRBC: 0 % (ref 0.0–0.2)

## 2023-09-25 LAB — PHOSPHORUS: Phosphorus: 3.5 mg/dL (ref 2.5–4.6)

## 2023-09-25 LAB — TSH: TSH: <0.01 u[IU]/mL — ABNORMAL LOW (ref 0.350–4.500)

## 2023-09-25 LAB — GLUCOSE, CAPILLARY
Glucose-Capillary: 218 mg/dL — ABNORMAL HIGH (ref 70–99)
Glucose-Capillary: 225 mg/dL — ABNORMAL HIGH (ref 70–99)
Glucose-Capillary: 138 mg/dL — ABNORMAL HIGH (ref 70–99)
Glucose-Capillary: 180 mg/dL — ABNORMAL HIGH (ref 70–99)

## 2023-09-25 LAB — MAGNESIUM
Magnesium: 2 mg/dL (ref 1.7–2.4)
Magnesium: 2 mg/dL (ref 1.7–2.4)

## 2023-09-25 LAB — T4, FREE: Free T4: 2.54 ng/dL — ABNORMAL HIGH (ref 0.61–1.12)

## 2023-09-25 MED ORDER — SODIUM CHLORIDE 0.9% FLUSH
3.0000 mL | Freq: Two times a day (BID) | INTRAVENOUS | Status: DC
  Administered 2023-09-25 – 2023-10-04 (×17): 3 mL via INTRAVENOUS

## 2023-09-25 MED ORDER — DOFETILIDE 500 MCG PO CAPS
500.0000 ug | ORAL_CAPSULE | Freq: Two times a day (BID) | ORAL | Status: DC
  Administered 2023-09-25: 500 ug via ORAL
  Filled 2023-09-25 (×2): qty 1

## 2023-09-25 MED ORDER — SODIUM CHLORIDE 0.9% FLUSH
3.0000 mL | INTRAVENOUS | Status: DC | PRN
  Administered 2023-09-28: 3 mL via INTRAVENOUS

## 2023-09-25 MED ORDER — PROPYLTHIOURACIL 50 MG PO TABS
200.0000 mg | ORAL_TABLET | ORAL | Status: DC
  Administered 2023-09-25 – 2023-09-26 (×6): 200 mg via ORAL
  Filled 2023-09-25 (×8): qty 4

## 2023-09-25 MED ORDER — MAGNESIUM SULFATE 2 GM/50ML IV SOLN
2.0000 g | Freq: Once | INTRAVENOUS | Status: AC
  Administered 2023-09-25: 2 g via INTRAVENOUS
  Filled 2023-09-25: qty 50

## 2023-09-25 MED ORDER — CHOLESTYRAMINE LIGHT 4 G PO PACK
4.0000 g | PACK | Freq: Four times a day (QID) | ORAL | Status: DC
  Administered 2023-09-25: 4 g via ORAL
  Filled 2023-09-25 (×4): qty 1

## 2023-09-25 MED ORDER — SODIUM CHLORIDE 0.9 % IV SOLN: 250.0000 mL | INTRAVENOUS | Status: AC | PRN

## 2023-09-25 NOTE — Plan of Care (Signed)
  Problem: Education: Goal: Knowledge of General Education information will improve Description: Including pain rating scale, medication(s)/side effects and non-pharmacologic comfort measures Outcome: Progressing   Problem: Health Behavior/Discharge Planning: Goal: Ability to manage health-related needs will improve Outcome: Progressing   Problem: Clinical Measurements: Goal: Ability to maintain clinical measurements within normal limits will improve Outcome: Progressing Goal: Will remain free from infection Outcome: Progressing Goal: Diagnostic test results will improve Outcome: Progressing Goal: Cardiovascular complication will be avoided Outcome: Progressing   Problem: Coping: Goal: Level of anxiety will decrease Outcome: Progressing   Problem: Pain Managment: Goal: General experience of comfort will improve and/or be controlled Outcome: Progressing   Problem: Safety: Goal: Ability to remain free from injury will improve Outcome: Progressing

## 2023-09-25 NOTE — CV Procedure (Signed)
   TRANSESOPHAGEAL ECHOCARDIOGRAM GUIDED DIRECT CURRENT CARDIOVERSION  NAME:  Tommy Rowe    MRN: [id number] DOB:  [birth year]    ADMIT DATE: 09/23/2023  INDICATIONS: Symptomatic atrial flutter/fibrillation  PROCEDURE:   Informed consent was obtained prior to the procedure. The risks, benefits and alternatives for the procedure were discussed and the patient comprehended these risks.  Risks include, but are not limited to, cough, sore throat, vomiting, nausea, somnolence, esophageal and stomach trauma or perforation, bleeding, low blood pressure, aspiration, pneumonia, infection, trauma to the teeth and death.    After a procedural time-out, the oropharynx was anesthetized and the patient was sedated by the anesthesia service. The transesophageal probe was inserted in the esophagus and stomach without difficulty and multiple views were obtained. Sedation by CCM.   COMPLICATIONS:    Complications: No complications Patient tolerated procedure well.  KEY FINDINGS:  LV: severely reduced function RV: Severely reduced function  No significant valvular abnormalities LAA: no thrombus  CARDIOVERSION:     Indications:  Symptomatic Atrial Flutter  Procedure Details:  Once the TEE was complete, the patient had the defibrillator pads placed in the anterior and posterior position. Once an appropriate level of sedation was confirmed, the patient was cardioverted x 3 with 200J of biphasic synchronized energy.  After the first shock he converted from AFL to atrial fibrillation. He converted briefly into NSR after the next two shocks but unfortunately converted back into AFL shortly after. There were no apparent complications.  The patient had normal neuro status and respiratory status post procedure with vitals stable as recorded elsewhere.  Adequate airway was maintained throughout and vital signs monitored per protocol.  Thais Silberstein Advanced Heart Failure 10:32 AM

## 2023-09-25 NOTE — Progress Notes (Addendum)
 Patient Name: Tommy Rowe Date of Encounter: 09/25/2023  Primary Cardiologist: None Electrophysiologist: None  Interval Summary   Patient is doing well. No acute concerns at this time. Remains in AFL. Looking forward to going home when he is able.   Wife at bedside.   Vital Signs    Vitals:   09/25/23 0600 09/25/23 0700 09/25/23 0800 09/25/23 1021  BP: (!) 89/78 (!) 86/69 96/74 103/88  Pulse:      Resp:      Temp:      TempSrc:      SpO2: 96% 91% 95% 96%  Weight:      Height:        Intake/Output Summary (Last 24 hours) at 09/25/2023 1025 Last data filed at 09/24/2023 1935 Gross per 24 hour  Intake 390.48 ml  Output --  Net 390.48 ml   Filed Weights   09/23/23 0600 09/24/23 0500 09/25/23 0500  Weight: 86 kg 86 kg 86 kg    Physical Exam    GEN- The patient is well appearing, alert and oriented x 3 today.   Lungs- Clear to ausculation bilaterally, normal work of breathing Cardiac- Regular rate and rhythm (AFL 140's), no murmurs, rubs or gallops GI- soft, NT, ND, + BS Extremities- no clubbing or cyanosis. No edema  Telemetry    AFL 140-145  (personally reviewed)  Hospital Course    Tommy Rowe is a 68 y.o. male with PMH AF/AFL, CAD s/p DES to LAD 08/2021, HTN, HLD, LBBB, hyperthyroidism & anxiety admitted for [facility]. Evidence of hyperthyroid / thyroid  storm.    Assessment & Plan    Symptomatic Paroxysmal Atrial Fibrillation / Atrial Flutter  On amiodarone  PTA. Pending ablation at [facility] 10/30/23, multiple recent failed DCCV. Prior LVEF 45-50% in 08/2021. LVEF 09/23/23 20-25%  -primary treatment aimed at hyperthyroidism -QTc in NSR ~459ms (not corrected for QRS) by tele review post DCCV  -not a candidate for further amiodarone  -await amiodarone  level > last consistent PO amio use was 8-9 months ago -AF pre-dates hyperthyroidism, he ultimately will benefit from catheter ablation, timing to be determined  -continue propranolol   Secondary Hypercoagulable  State  -continue Eliquis  5mg  BID, dose reviewed and appropriate by age / wt   Hyperthyroidism  -per PCCM  -will need outpatient endocrine follow up    For questions or updates, please contact [facility] Please consult [email or web address] for contact info under     Signed, Daphne Barrack, NP-C, AGACNP-BC [facility] - Electrophysiology  09/25/2023, 10:27 AM  I have seen, examined the patient, and reviewed the above assessment and plan.    Interval: Patient underwent cardioversion yesterday with ERAF.  Now back in atrial flutter with 2-1 conduction.  No acute overnight events. Patient reports feeling relatively well. No new or acute complaints.   General: Well developed, in no acute distress.  Neck: No JVD.  Cardiac: Tachycardic, regular rhythm.  Resp: Normal work of breathing.  Ext: No edema.  Neuro: No gross focal deficits.  Psych: Normal affect.   Assessment and Plan:  Patient has history of paroxysmal AF dating back several years. Had previously been on amiodarone . Highly symptomatic with rapid rates during episodes. He has subsequently developed hyperthyroidism.  Hyperthyroidism appears out of proportion to remote history of amiodarone  use.  Hyperthyroidism is likely the driving factor for his difficult to control atrial fibrillation.  Attempts to treat atrial fibrillation are unlikely to be successful until thyroid  can be adequately addressed.   #. Symptomatic paroxysmal  atrial fibrillation:  #. Atrial flutter: #. Secondary hypercoagulable state due to AF:  -We had previously discussed trying to expedite catheter ablation given repeated ED admissions for atrial fibrillation.  Given new concern for thyroid  storm, addressing his thyroid  disease is first priority.  Unlikely to have much success maintaining sinus rhythm until thyroid  can be adequately managed.  Ultimately, he would benefit from catheter ablation as his atrial fibrillation predates his thyroid  disease.   But we will wait until thyroid  issues have been adequately treated.   -Given ERAF following cardioversion, we will pursue Tikosyn loading in efforts to maintain sinus rhythm as bridge to thyroid  disease treatment and/or catheter ablation.  Patient states that he has not taken oral amiodarone  for months.  He has received IV boluses intermittently when coming to the ER, but suspect his overall exposure in the past 1 to 2 months has been quite low.  We will discuss with pharmacy regarding timing of Tikosyn. -Continue Eliquis  5 mg twice daily.   #. Hyperthyroidism:  - Avoid amiodarone  for now.  Appreciate critical care management assistance in managing hyperthyroidism.  Ultimately, needs endocrinology evaluation and management.   Fonda Kitty, MD 09/25/2023 9:33 PM

## 2023-09-25 NOTE — Progress Notes (Signed)
 Advanced Heart Failure Rounding Note  Cardiologist: None  Chief Complaint: A Flutter RVR Hypothyroidism Subjective:   6/24 Failed Cardioversion    Denies SOB.    Objective:   Weight Range: 86 kg Body mass index is 25.71 kg/m.   Vital Signs:   Temp:  [97.7 F (36.5 C)-98.4 F (36.9 C)] 98 F (36.7 C) (06/25 0329) Pulse Rate:  [144-146] 146 (06/24 1820) Resp:  [14-18] 14 (06/25 0300) BP: (82-119)/(66-92) 86/69 (06/25 0700) SpO2:  [86 %-98 %] 91 % (06/25 0700) Weight:  [86 kg] 86 kg (06/25 0500) Last BM Date : 09/24/23  Weight change: Filed Weights   09/23/23 0600 09/24/23 0500 09/25/23 0500  Weight: 86 kg 86 kg 86 kg    Intake/Output:   Intake/Output Summary (Last 24 hours) at 09/25/2023 0858 Last data filed at 09/24/2023 1935 Gross per 24 hour  Intake 420.48 ml  Output --  Net 420.48 ml      Physical Exam   General:   No resp difficulty Neck: supple. no JVD.  Cor: PMI nondisplaced. Regular rate & rhythm. No rubs, gallops or murmurs. Lungs: clear Abdomen: soft, nontender, nondistended.  Extremities: no cyanosis, clubbing, rash, edema Neuro: alert & oriented x3   Telemetry   A flutter 130-140s   EKG      Labs    CBC Recent Labs    09/24/23 0316 09/25/23 0402  WBC 8.9 8.5  HGB 13.7 13.4  HCT 39.5 39.3  MCV 93.8 92.5  PLT 126* 143*   Basic Metabolic Panel Recent Labs    93/75/74 0316 09/25/23 0402  NA 134* 134*  K 4.8 4.4  CL 102 103  CO2 27 23  GLUCOSE 150* 176*  BUN 20 32*  CREATININE 0.97 0.86  CALCIUM  8.7* 8.8*  MG 2.1 2.0  PHOS 2.6 3.5   Liver Function Tests Recent Labs    09/23/23 0201  AST 16  ALT 20  ALKPHOS 40  BILITOT 1.6*  PROT 6.6  ALBUMIN 3.4*   No results for input(s): LIPASE, AMYLASE in the last 72 hours. Cardiac Enzymes No results for input(s): CKTOTAL, CKMB, CKMBINDEX, TROPONINI in the last 72 hours.  BNP: BNP (last 3 results) Recent Labs    09/04/23 0727  BNP 154.9*     ProBNP (last 3 results) No results for input(s): PROBNP in the last 8760 hours.   D-Dimer No results for input(s): DDIMER in the last 72 hours. Hemoglobin A1C Recent Labs    09/23/23 1608  HGBA1C 5.0   Fasting Lipid Panel No results for input(s): CHOL, HDL, LDLCALC, TRIG, CHOLHDL, LDLDIRECT in the last 72 hours. Thyroid  Function Tests Recent Labs    09/23/23 0201 09/25/23 0402  TSH <0.010* <0.010*  T3FREE 4.5*  --     Other results:   Imaging    No results found.    Medications:     Scheduled Medications:  apixaban   5 mg Oral BID   Chlorhexidine  Gluconate Cloth  6 each Topical Daily   escitalopram   20 mg Oral QHS   fentaNYL  (SUBLIMAZE ) injection  100 mcg Intravenous Once   folic acid  1 mg Oral Daily   hydrocortisone sod succinate (SOLU-CORTEF) inj  100 mg Intravenous Q8H   insulin aspart  0-15 Units Subcutaneous TID WC   insulin aspart  0-5 Units Subcutaneous QHS   Iodine Strong (Lugols)  0.4 mL Oral TID   melatonin  3 mg Oral QHS   propofol   100 mg Intravenous Once  propranolol  40 mg Oral QID   propylthiouracil  200 mg Oral Q8H   rosuvastatin   20 mg Oral QHS   tamsulosin   0.4 mg Oral Daily   thiamine  100 mg Oral Daily    Infusions:  sodium chloride  Stopped (09/24/23 1645)    PRN Medications: docusate sodium , HYDROmorphone  (DILAUDID ) injection, naLOXone (NARCAN)  injection, polyethylene glycol    Patient Profile   Admitted with A flutter RVR and Thyroid  Storm  Assessment/Plan  1. A flutter  RVR Followed by [facility], Dr Debby , EP and was seen 09/13/23. Considering ablation in July or August.  Discussed previous AA. We are trying to find out.  He is unsure but confirmed Amio and Multaq .    He has had multiple cardioversion. Failed to covert at 100J, 150 J.  Has also had ablation x3.  6/24 Failed DC-CV < 1 minute SR. Back in A flutter RVR.  - Continue eliquis  5 mg twice a day . -EP appreciated.  - Continue  propanalol.  - Checking amio level. EP considering Tikosyn. Needs better rate control prior to discharge.   2 . Thyroid  Storm  TSH, undetectable.  T4, 2.95 -->2.5  Per CCM. On soldumedrol, PTU, and Propanalol. Increase PTU 200 every 4 hours.   Will need outpatient follow up with Endocrinology   3. Chronic HFmEF, NICM ---> Acute HFrEf  Suspect tachy mediated.  Echo LVEF down to 20-25% with smoke.  - TEE no clot.  - Appears euvolemic.  GDMT limited with soft BP.          Length of Stay: 2  Greig Mosses, NP  09/25/2023, 8:58 AM  Advanced Heart Failure Team Pager [phone] (M-F; 7a - 5p)  Please contact [facility] for night-coverage after hours (5p -7a ) and weekends on [email or web address]

## 2023-09-25 NOTE — Progress Notes (Addendum)
 Pharmacy: Dofetilide (Tikosyn) - Initial Consult Assessment and Electrolyte Replacement  Pharmacy consulted to assist in monitoring and replacing electrolytes in this 68 y.o. male admitted on 09/23/2023 undergoing dofetilide initiation. First dofetilide dose: 09/25/23 PM  Assessment:  Patient Exclusion Criteria: If any screening criteria checked as Yes, then  patient  should NOT receive dofetilide until criteria item is corrected.  If "Yes" please indicate correction plan.  YES  NO Patient  Exclusion Criteria Correction Plan/Comments   []   [x]   Baseline QTc interval is greater than or equal to 440 msec. IF above YES box checked dofetilide contraindicated unless patient has ICD; then may proceed if QTc 500-550 msec or with known ventricular conduction abnormalities may proceed with QTc 550-600 msec. QTc =   Baseline QTc in NSR 410 msec per EP   []   [x]   Patient is known or suspected to have a digoxin  level greater than 2 ng/ml: No results found for: DIGOXIN      []   [x]   Creatinine clearance less than 20 ml/min (calculated using Cockcroft-Gault, actual body weight and serum creatinine): Estimated Creatinine Clearance: 92.7 mL/min (by C-G formula based on SCr of 0.86 mg/dL).     []   [x]  Patient has received drugs known to prolong the QT intervals within the last 48 hour (examples: phenothiazines, tricyclics or tetracyclic antidepressants, macrolides, 1st generation H-1 antihistamines (especially diphenhydramine), fluoroquinolones, azoles, ondansetron , metoclopramide, promethazine).   Updated information on QT prolonging agents is available to be searched on the following database:QT prolonging agents -If SSRI or antihistamine needed, preferred options are sertraline and loratadine respectively     []   [x]  Patient received a dose of a thiazide diuretic in the last 48 hours [including hydrochlorothiazide  (Oretic ) alone or in any combination including triamterene (Dyazide,  Maxzide)].    []   [x]  Patient received a medication known to increase dofetilide plasma concentrations prior to initial dofetilide dose:  Trimethoprim  (Primsol, Proloprim ) in the last 36 hours Verapamil (Calan, Verelan) in the last 36 hours or a sustained release dose in the last 72 hours Megestrol (Megace) in the last 5 days  Cimetidine (Tagamet) in the last 6 hours Ketoconazole (Nizoral) in the last 24 hours Itraconazole (Sporanox) in the last 48 hours  Prochlorperazine (Compazine) in the last 36 hours     []   [x]   Patient is known to have a history of torsades de pointes; congenital or acquired long QT syndromes.    []   [x]   Patient has received a Class 1 and Class 3 antiarrhythmic with less than 2 half-lives since last dose. (Disopyramide, Quinidine, Procainamide, Lidocaine , Mexiletine, Flecainide, Propafenone, Sotalol, Dronedarone )    [x]   []   Patient has received amiodarone  therapy in the past 3 months or amiodarone  level is greater than 0.3 ng/ml. Patient received amiodarone  infusion for ~6 hours on 09/18/23, cumulative IV amiodarone  dose ~250 mg. Discussed with EP, suspect amiodarone  level will be low as patient did not receive a full load. Will proceed with Tikosyn initiation at this time.    Labs:    Component Value Date/Time   K 4.4 09/25/2023 0402   MG 2.0 09/25/2023 0402     Plan: Select One Calculated CrCl  Dose q12h  [x]  > 60 ml/min 500 mcg  []  40-60 ml/min 250 mcg  []  20-40 ml/min 125 mcg   [x]   Physician selected initial dose within range recommended for patients level of renal function - will monitor for response.  []   Physician selected initial dose outside of range recommended for  patients level of renal function - will discuss if the dose should be altered at this time.   Patient has been appropriately anticoagulated with Eliquis  5 mg PO BID.  Potassium: K >/= 4: Appropriate to initiate Tikosyn, no replacement needed    Magnesium : Mg >2: Appropriate  to initiate Tikosyn. Supplementing with Mg 2 gm IV x1 today.  Thank you for allowing pharmacy to participate in this patient's care   Morna Breach, PharmD PGY-1 Acute Care Pharmacy Resident 09/25/2023 11:10 AM

## 2023-09-26 DIAGNOSIS — E785 Hyperlipidemia, unspecified: Secondary | ICD-10-CM | POA: Diagnosis not present

## 2023-09-26 DIAGNOSIS — I4892 Unspecified atrial flutter: Secondary | ICD-10-CM | POA: Diagnosis not present

## 2023-09-26 DIAGNOSIS — I10 Essential (primary) hypertension: Secondary | ICD-10-CM | POA: Diagnosis not present

## 2023-09-26 LAB — GLUCOSE, CAPILLARY
Glucose-Capillary: 135 mg/dL — ABNORMAL HIGH (ref 70–99)
Glucose-Capillary: 144 mg/dL — ABNORMAL HIGH (ref 70–99)
Glucose-Capillary: 221 mg/dL — ABNORMAL HIGH (ref 70–99)
Glucose-Capillary: 106 mg/dL — ABNORMAL HIGH (ref 70–99)
Glucose-Capillary: 114 mg/dL — ABNORMAL HIGH (ref 70–99)

## 2023-09-26 LAB — BASIC METABOLIC PANEL WITH GFR
Anion gap: 7 (ref 5–15)
BUN: 26 mg/dL — ABNORMAL HIGH (ref 8–23)
CO2: 25 mmol/L (ref 22–32)
Calcium: 8.8 mg/dL — ABNORMAL LOW (ref 8.9–10.3)
Chloride: 105 mmol/L (ref 98–111)
Creatinine, Ser: 0.86 mg/dL (ref 0.61–1.24)
GFR, Estimated: >60 mL/min (ref >60)
Glucose, Bld: 163 mg/dL — ABNORMAL HIGH (ref 70–99)
Potassium: 4.2 mmol/L (ref 3.5–5.1)
Sodium: 137 mmol/L (ref 135–145)

## 2023-09-26 LAB — HEPATIC FUNCTION PANEL
ALT: 15 U/L (ref 0–44)
AST: 12 U/L — ABNORMAL LOW (ref 15–41)
Albumin: 2.9 g/dL — ABNORMAL LOW (ref 3.5–5.0)
Alkaline Phosphatase: 36 U/L — ABNORMAL LOW (ref 38–126)
Bilirubin, Direct: <0.1 mg/dL (ref 0.0–0.2)
Total Bilirubin: 0.7 mg/dL (ref 0.0–1.2)
Total Protein: 4.9 g/dL — ABNORMAL LOW (ref 6.5–8.1)

## 2023-09-26 LAB — CBC
HCT: 36.2 % — ABNORMAL LOW (ref 39.0–52.0)
Hemoglobin: 12.5 g/dL — ABNORMAL LOW (ref 13.0–17.0)
MCH: 32.1 pg (ref 26.0–34.0)
MCHC: 34.5 g/dL (ref 30.0–36.0)
MCV: 93.1 fL (ref 80.0–100.0)
Platelets: 136 10*3/uL — ABNORMAL LOW (ref 150–400)
RBC: 3.89 MIL/uL — ABNORMAL LOW (ref 4.22–5.81)
RDW: 12.2 % (ref 11.5–15.5)
WBC: 6.6 10*3/uL (ref 4.0–10.5)
nRBC: 0 % (ref 0.0–0.2)

## 2023-09-26 LAB — MAGNESIUM: Magnesium: 2 mg/dL (ref 1.7–2.4)

## 2023-09-26 LAB — T4, FREE: Free T4: 2.27 ng/dL — ABNORMAL HIGH (ref 0.61–1.12)

## 2023-09-26 LAB — PHOSPHORUS: Phosphorus: 4.2 mg/dL (ref 2.5–4.6)

## 2023-09-26 LAB — T3, FREE: T3, Free: 1.8 pg/mL — ABNORMAL LOW (ref 2.0–4.4)

## 2023-09-26 MED ORDER — DOFETILIDE 250 MCG PO CAPS
250.0000 ug | ORAL_CAPSULE | Freq: Two times a day (BID) | ORAL | Status: DC
  Administered 2023-09-26: 250 ug via ORAL
  Filled 2023-09-26 (×3): qty 1

## 2023-09-26 MED ORDER — METHIMAZOLE 5 MG PO TABS
5.0000 mg | ORAL_TABLET | Freq: Three times a day (TID) | ORAL | Status: DC
  Administered 2023-09-26 – 2023-10-04 (×24): 5 mg via ORAL
  Filled 2023-09-26 (×27): qty 1

## 2023-09-26 MED ORDER — SPIRONOLACTONE 12.5 MG HALF TABLET
12.5000 mg | ORAL_TABLET | Freq: Every day | ORAL | Status: DC
  Administered 2023-09-26: 12.5 mg via ORAL
  Filled 2023-09-26 (×2): qty 1

## 2023-09-26 MED ORDER — HYDROCORTISONE SOD SUC (PF) 100 MG IJ SOLR
100.0000 mg | INTRAMUSCULAR | Status: AC
  Administered 2023-09-27 – 2023-09-28 (×2): 100 mg via INTRAVENOUS
  Filled 2023-09-26 (×2): qty 2

## 2023-09-26 MED ORDER — PROPRANOLOL HCL 20 MG PO TABS
20.0000 mg | ORAL_TABLET | Freq: Four times a day (QID) | ORAL | Status: DC
  Administered 2023-09-26 (×3): 20 mg via ORAL
  Filled 2023-09-26 (×5): qty 1

## 2023-09-26 NOTE — Progress Notes (Signed)
 Advanced Heart Failure Rounding Note  Cardiologist: None  Chief Complaint: A Flutter RVR Hypothyroidism Subjective:   6/24 Failed Cardioversion  6/25 Started on Tikosyn. Converted to SR at 2330  Denies SOB.      Objective:   Weight Range: 86.1 kg Body mass index is 25.74 kg/m.   Vital Signs:   Temp:  [97.5 F (36.4 C)-98.3 F (36.8 C)] 97.9 F (36.6 C) (06/26 0834) BP: (85-108)/(57-88) 108/71 (06/26 0700) SpO2:  [89 %-96 %] 95 % (06/26 0600) Weight:  [86.1 kg] 86.1 kg (06/26 0157) Last BM Date : 09/25/23  Weight change: Filed Weights   09/24/23 0500 09/25/23 0500 09/26/23 0157  Weight: 86 kg 86 kg 86.1 kg    Intake/Output:   Intake/Output Summary (Last 24 hours) at 09/26/2023 0942 Last data filed at 09/26/2023 0100 Gross per 24 hour  Intake 170 ml  Output --  Net 170 ml      Physical Exam   General:   No resp difficulty Neck: supple. no JVD.  Cor: PMI nondisplaced. Regular rate & rhythm. No rubs, gallops or murmurs. Lungs: clear Abdomen: soft, nontender, nondistended.  Extremities: no cyanosis, clubbing, rash, edema Neuro: alert & oriented x3   Telemetry  SR 50-60s    EKG      Labs    CBC Recent Labs    09/25/23 0402 09/26/23 0323  WBC 8.5 6.6  HGB 13.4 12.5*  HCT 39.3 36.2*  MCV 92.5 93.1  PLT 143* 136*   Basic Metabolic Panel Recent Labs    93/74/74 0402 09/25/23 1130 09/26/23 0323  NA 134* 138 137  K 4.4 4.5 4.2  CL 103 104 105  CO2 23 25 25   GLUCOSE 176* 134* 163*  BUN 32* 29* 26*  CREATININE 0.86 0.85 0.86  CALCIUM  8.8* 9.1 8.8*  MG 2.0 2.0 2.0  PHOS 3.5  --  4.2   Liver Function Tests Recent Labs    09/26/23 0323  AST 12*  ALT 15  ALKPHOS 36*  BILITOT 0.7  PROT 4.9*  ALBUMIN 2.9*   No results for input(s): LIPASE, AMYLASE in the last 72 hours. Cardiac Enzymes No results for input(s): CKTOTAL, CKMB, CKMBINDEX, TROPONINI in the last 72 hours.  BNP: BNP (last 3 results) Recent Labs     09/04/23 0727  BNP 154.9*    ProBNP (last 3 results) No results for input(s): PROBNP in the last 8760 hours.   D-Dimer No results for input(s): DDIMER in the last 72 hours. Hemoglobin A1C Recent Labs    09/23/23 1608  HGBA1C 5.0   Fasting Lipid Panel No results for input(s): CHOL, HDL, LDLCALC, TRIG, CHOLHDL, LDLDIRECT in the last 72 hours. Thyroid  Function Tests Recent Labs    09/25/23 0402  TSH <0.010*  T3FREE 1.8*    Other results:   Imaging    No results found.    Medications:     Scheduled Medications:  apixaban   5 mg Oral BID   Chlorhexidine  Gluconate Cloth  6 each Topical Daily   dofetilide  250 mcg Oral BID   escitalopram   20 mg Oral QHS   folic acid  1 mg Oral Daily   [START ON 09/27/2023] hydrocortisone sod succinate (SOLU-CORTEF) inj  100 mg Intravenous Q24H   insulin aspart  0-15 Units Subcutaneous TID WC   insulin aspart  0-5 Units Subcutaneous QHS   Iodine Strong (Lugols)  0.4 mL Oral TID   melatonin  3 mg Oral QHS   methIMAzole   5 mg Oral TID   propranolol  40 mg Oral QID   rosuvastatin   20 mg Oral QHS   sodium chloride  flush  3 mL Intravenous Q12H   tamsulosin   0.4 mg Oral Daily   thiamine  100 mg Oral Daily    Infusions:  sodium chloride       PRN Medications: sodium chloride , docusate sodium , HYDROmorphone  (DILAUDID ) injection, polyethylene glycol, sodium chloride  flush    Patient Profile   Admitted with A flutter RVR and Thyroid  Storm  Assessment/Plan  1. A flutter  RVR Followed by [facility], Dr Debby , EP and was seen 09/13/23. Considering ablation in July or August.  Discussed previous AA. We are trying to find out.  He is unsure but confirmed Amio and Multaq .    He has had multiple cardioversion. Failed to covert at 100J, 150 J.  Has also had ablation x3.  6/24 Failed DC-CV < 1 minute SR. Back in A flutter RVR.  EP started Tikosyn yesterday. Converted to SR 2230. Bradycardic. Tikosyn dose has been  cut back.  -Remains on propanolol. - Continue eliquis  5 mg twice a day.  Follow K /Mag   2 . Thyroid  Storm  TSH, undetectable.  T4, 2.95 -->2.5  Per CCM.  On solumedrol and Propanalol. Switched to methimazole  5 mg three time a day.     Endocrinology appointment made for 10/03/23.  3. Chronic HFmEF, NICM ---> Acute HFrEf  Suspect tachy mediated.  Echo LVEF down to 20-25% with smoke.  - TEE no clot.  -Appears euvolemic.  GDMT limited with soft BP.  Consider spiro tomorrow.     Ambulate.  Length of Stay: 3  Orton Capell, NP  09/26/2023, 9:42 AM  Advanced Heart Failure Team Pager [phone] (M-F; 7a - 5p)  Please contact [facility] for night-coverage after hours (5p -7a ) and weekends on [email or web address]

## 2023-09-26 NOTE — Progress Notes (Signed)
   NAME:  Tommy Rowe, MRN:  [id number], DOB:  12/29/1955, LOS: 3 ADMISSION DATE:  09/23/2023, CONSULTATION DATE:  09/23/2023 REFERRING MD:  Dr Raford, CHIEF COMPLAINT:  Hypotension/Atrial Flutter  History of Present Illness:  68 y/o male with PMH for  A fib with RVR with several electric cardioversions which have failed on Eliquis , Hyperthyroidism, CAD s/p DES to LAD 08/2021, HTN, HLD, LBBB who presented after going back into A fib with RVR, took 25mg  Metoprolol  x 3 without success after speaking to Cardiology, the dose was then increased to 50mg  twice yesterday but this did not help either.  He therefore came to [facility] ED. Cardiology evaluation pending.  He c/o tenderness in his neck/Thyroid  and also c/p chest pains with deep inspiration.  He also c/o light headedness and dizziness but had no LOC.  There is concern that he may have Thyroid  storm from contribution from Amiodarone . His BP was low in ED and so he was not given any meds to slow the HR down.  He was noted to be in Atrial Flutter with rapid response in 140's.   TSH <0.010 and FT4 2.95.  Pertinent  Medical History  A-fib (HCC), Anxiety, HLD (hyperlipidemia), and HTN (hypertension).  Significant Hospital Events: Including procedures, antibiotic start and stop dates in addition to other pertinent events   6/23: admit to ICU  Interim History / Subjective:  Converted overnight with tikosyn Intervals are a bit long, EP making adjustments  Objective    Blood pressure 108/71, pulse (!) 146, temperature 97.9 F (36.6 C), temperature source Oral, resp. rate 14, height 6' (1.829 m), weight 86.1 kg, SpO2 95%.        Intake/Output Summary (Last 24 hours) at 09/26/2023 9092 Last data filed at 09/26/2023 0100 Gross per 24 hour  Intake 170 ml  Output --  Net 170 ml   Filed Weights   09/24/23 0500 09/25/23 0500 09/26/23 0157  Weight: 86 kg 86 kg 86.1 kg    Examination: No distress Ext warm Aox3 Moves to command Ambulatory and  transfers  Labs look good  Resolved problem list   Assessment and Plan  Hyperthyroidism-driven Afib/RVR- amio also contributed but sounds like thyroid  issues predated.  Symptomatically feels better but remains in RVR.  Thyroid  US  benign.  Improving with usual therapy Tachycardia-mediated cardiomyopathy Swirl on TTE- TEE no clot BPH Hx CAD HTN HLD - PTU to methimazole  - Anti-arrhythmics for EP - Lugols x 1 more day - Wean hydrocortisone - AC - Likely okay for progressive later today if EP okay with it  Rolan Sharps MD PCCM

## 2023-09-26 NOTE — Progress Notes (Addendum)
 Electrophysiology Rounding Note  Patient Name: Tommy Rowe Date of Encounter: 09/26/2023  Primary Cardiologist: None  Electrophysiologist: None    Subjective   Pt converted to sinus rhythm after first dose on Tikosyn 500 mcg BID (no post dose EKG obtained)  QTc from EKG post am dose with reduced Tikosyn to 250 mcg shows borderline QTc at 491 ms (manually corrected)  The patient is doing well today.  At this time, the patient denies chest pain, shortness of breath, or any new concerns.  Inpatient Medications    Scheduled Meds:  apixaban   5 mg Oral BID   Chlorhexidine  Gluconate Cloth  6 each Topical Daily   dofetilide  250 mcg Oral BID   escitalopram   20 mg Oral QHS   folic acid  1 mg Oral Daily   [START ON 09/27/2023] hydrocortisone sod succinate (SOLU-CORTEF) inj  100 mg Intravenous Q24H   insulin aspart  0-15 Units Subcutaneous TID WC   insulin aspart  0-5 Units Subcutaneous QHS   Iodine Strong (Lugols)  0.4 mL Oral TID   melatonin  3 mg Oral QHS   methIMAzole   5 mg Oral TID   propranolol  20 mg Oral QID   rosuvastatin   20 mg Oral QHS   sodium chloride  flush  3 mL Intravenous Q12H   spironolactone   12.5 mg Oral Daily   tamsulosin   0.4 mg Oral Daily   thiamine  100 mg Oral Daily   Continuous Infusions:  PRN Meds: docusate sodium , HYDROmorphone  (DILAUDID ) injection, polyethylene glycol, sodium chloride  flush   Vital Signs    Vitals:   09/26/23 0500 09/26/23 0600 09/26/23 0700 09/26/23 0834  BP: 99/71 (!) 93/57 108/71   Pulse:      Resp:      Temp:    97.9 F (36.6 C)  TempSrc:    Oral  SpO2: 94% 95%    Weight:      Height:        Intake/Output Summary (Last 24 hours) at 09/26/2023 1127 Last data filed at 09/26/2023 0100 Gross per 24 hour  Intake 170 ml  Output --  Net 170 ml   Filed Weights   09/24/23 0500 09/25/23 0500 09/26/23 0157  Weight: 86 kg 86 kg 86.1 kg    Physical Exam    GEN- pleasant adult male sitting up in chair in NAD, A&O x 3.  Normal affect.  Lungs- CTAB, Normal effort.  Heart- Regular rate and rhythm. No M/G/R GI- Soft, NT, ND Extremities- No clubbing, cyanosis, or edema Skin- no rash or lesion  Labs    CBC Recent Labs    09/25/23 0402 09/26/23 0323  WBC 8.5 6.6  HGB 13.4 12.5*  HCT 39.3 36.2*  MCV 92.5 93.1  PLT 143* 136*   Basic Metabolic Panel Recent Labs    93/74/74 0402 09/25/23 1130 09/26/23 0323  NA 134* 138 137  K 4.4 4.5 4.2  CL 103 104 105  CO2 23 25 25   GLUCOSE 176* 134* 163*  BUN 32* 29* 26*  CREATININE 0.86 0.85 0.86  CALCIUM  8.8* 9.1 8.8*  MG 2.0 2.0 2.0  PHOS 3.5  --  4.2    Telemetry    AFL 140's until 2310 when pt converted to SB 50's-SR 60's (personally reviewed)  Patient Profile     Tommy Rowe is a 68 y.o. male with a past medical history significant for persistent atrial fibrillation.  They were admitted for tikosyn load.   Assessment & Plan  Symptomatic Paroxysmal Atrial Fibrillation Hx of prior amiodarone  use. Multiple failed DCCV's in recent months. Prior LVEF 45-50% in 08/2021. LVEF 09/23/23 20-25%   -Primary treatment aimed at correction of hyperthyroidism  -Pt converted to sinus rhythm on Tikosyn 500 mcg BID > dose reduced this am to 250 mcg due to prolonged QT.   -will review post AM dose EKG with MD  -First dose 6/25 PM. Pt would not be able to discharge until after EKG reviewed of 6/28 am dosing of Tikosyn  -Continue Eliquis  -Creatinine, ser  0.86 (06/26 0323) -Magnesium   2.0 (06/26 0323) -Potassium4.2 (06/26 0323) -No electrolyte supplementation needed  Secondary Hypercoagulable State  -continue Eliquis  5mg  BID, dose reviewed and appropriate by age / wt   Hyperthyroidism  -per PCCM   For questions or updates, please contact [facility] Please consult [email or web address] for contact info under Cardiology/STEMI.  Signed, Daphne Barrack, NP-C, AGACNP-BC [facility] - Electrophysiology  09/26/2023, 11:37 AM  I have seen, examined  the patient, and reviewed the above assessment and plan.    Interval:  Patient received first dose of Tikosyn last night. Converted to sinus rhythm. Reports feeling much better.   General: Well developed, in no acute distress.  Neck: No JVD.  Cardiac: Normal rate, regular rhythm.  Resp: Normal work of breathing.  Ext: No edema.  Neuro: No gross focal deficits.  Psych: Normal affect.   Cr: 0.86 K: 4.2  EKG: Sinus brady with RBBB, Qtc ~480-494ms  Assessment and Plan:  Patient has history of paroxysmal AF dating back several years. Had previously been on amiodarone . Highly symptomatic with rapid rates during episodes. He has subsequently developed hyperthyroidism.  Hyperthyroidism appears out of proportion to remote history of amiodarone  use.  Hyperthyroidism is likely the driving factor for his difficult to control atrial fibrillation.    We had previously discussed trying to expedite catheter ablation given repeated ED admissions for atrial fibrillation.  Given new concern for thyroid  storm, addressing his thyroid  disease is first priority.  Unlikely to have much success maintaining sinus rhythm until thyroid  can be adequately managed.  Ultimately, he would benefit from catheter ablation as his atrial fibrillation predates his thyroid  disease.  But we will wait until thyroid  issues have been adequately treated.    Given ERAF following cardioversion, we will pursue Tikosyn loading in efforts to maintain sinus rhythm as bridge to thyroid  disease treatment and/or catheter ablation.  Patient states that he has not taken oral amiodarone  for months.  He has received IV boluses intermittently when coming to the ER, but suspect his overall exposure in the past 1 to 2 months has been quite low.  We discussed with pharmacy regarding timing of Tikosyn. He received first dose evening of 6/25 and converted to sinus within hours.    #. Symptomatic paroxysmal atrial fibrillation:  #. Atrial flutter: #.  Secondary hypercoagulable state due to AF:  - Start/continue Tikosyn 250 mcg with ECG 2 hours after each dose.  - Continue routine monitoring of kidney function and electrolytes, replete as necessary.  - Continue Eliquis  5mg  BID.   #. Hyperthyroidism:  - Avoiding amiodarone  as above.  Appreciate critical care management assistance in managing hyperthyroidism.  Ultimately, needs endocrinology evaluation and management.  Fonda Kitty, MD 09/26/2023 9:08 PM

## 2023-09-26 NOTE — Progress Notes (Signed)
 Pharmacy: Dofetilide (Tikosyn) - Follow Up Assessment and Electrolyte Replacement  Pharmacy consulted to assist in monitoring and replacing electrolytes in this 68 y.o. male admitted on 09/23/2023 undergoing dofetilide initiation. First dofetilide dose: dofetilide 500 mcg BID starting 09/25/23 PM.  Labs:    Component Value Date/Time   K 4.2 09/26/2023 0323   MG 2.0 09/26/2023 0323     Plan: Potassium: K >/= 4: No additional supplementation needed  Magnesium : Mg > 2: No additional supplementation needed  Thank you for allowing pharmacy to participate in this patient's care   Morna Breach, PharmD PGY2 Cardiology Pharmacy Resident 09/26/2023 6:25 AM

## 2023-09-26 NOTE — Progress Notes (Signed)
 eLink Physician-Brief Progress Note Patient Name: Tommy Rowe DOB: [birth year] MRN: [id number]   Date of Service  09/26/2023  HPI/Events of Note  QTC 552  eICU Interventions  Bedside RN instructed to notify Electrophysiology who are managing patient's Tikosyn.        Davonna Ertl [place] Hable 09/26/2023, 8:47 PM

## 2023-09-26 NOTE — Progress Notes (Signed)
 Morning EKG reviewed     Shows remains in NSR with borderline QTc at 491 ms corrected for QRS.  Continue  Tikosyn 250 mcg BID.   Potassium4.2 (06/26 0323) Magnesium   2.0 (06/26 0323) Creatinine, ser  0.86 (06/26 0323)  Pt should not be discharged before am dose of Tikosyn on Saturday 6/28.  Wil need EKG post dose for review before discharge.   Daphne Barrack, NP-C, AGACNP-BC [facility] - Electrophysiology  09/26/2023, 5:14 PM

## 2023-09-27 DIAGNOSIS — I4891 Unspecified atrial fibrillation: Secondary | ICD-10-CM | POA: Diagnosis not present

## 2023-09-27 DIAGNOSIS — E0591 Thyrotoxicosis, unspecified with thyrotoxic crisis or storm: Secondary | ICD-10-CM | POA: Diagnosis not present

## 2023-09-27 LAB — MAGNESIUM: Magnesium: 1.9 mg/dL (ref 1.7–2.4)

## 2023-09-27 LAB — GLUCOSE, CAPILLARY
Glucose-Capillary: 96 mg/dL (ref 70–99)
Glucose-Capillary: 130 mg/dL — ABNORMAL HIGH (ref 70–99)
Glucose-Capillary: 129 mg/dL — ABNORMAL HIGH (ref 70–99)
Glucose-Capillary: 166 mg/dL — ABNORMAL HIGH (ref 70–99)
Glucose-Capillary: 99 mg/dL (ref 70–99)

## 2023-09-27 LAB — BASIC METABOLIC PANEL WITH GFR
Anion gap: 8 (ref 5–15)
BUN: 30 mg/dL — ABNORMAL HIGH (ref 8–23)
CO2: 25 mmol/L (ref 22–32)
Calcium: 8.6 mg/dL — ABNORMAL LOW (ref 8.9–10.3)
Chloride: 104 mmol/L (ref 98–111)
Creatinine, Ser: 0.89 mg/dL (ref 0.61–1.24)
GFR, Estimated: >60 mL/min (ref >60)
Glucose, Bld: 118 mg/dL — ABNORMAL HIGH (ref 70–99)
Potassium: 4 mmol/L (ref 3.5–5.1)
Sodium: 137 mmol/L (ref 135–145)

## 2023-09-27 LAB — CBC
HCT: 35.1 % — ABNORMAL LOW (ref 39.0–52.0)
Hemoglobin: 12 g/dL — ABNORMAL LOW (ref 13.0–17.0)
MCH: 32 pg (ref 26.0–34.0)
MCHC: 34.2 g/dL (ref 30.0–36.0)
MCV: 93.6 fL (ref 80.0–100.0)
Platelets: 136 10*3/uL — ABNORMAL LOW (ref 150–400)
RBC: 3.75 MIL/uL — ABNORMAL LOW (ref 4.22–5.81)
RDW: 12.3 % (ref 11.5–15.5)
WBC: 6.5 10*3/uL (ref 4.0–10.5)
nRBC: 0 % (ref 0.0–0.2)

## 2023-09-27 LAB — T4, FREE: Free T4: 2 ng/dL — ABNORMAL HIGH (ref 0.61–1.12)

## 2023-09-27 LAB — PHOSPHORUS: Phosphorus: 3.5 mg/dL (ref 2.5–4.6)

## 2023-09-27 MED ORDER — SERTRALINE HCL 50 MG PO TABS
50.0000 mg | ORAL_TABLET | Freq: Every day | ORAL | Status: DC
  Administered 2023-09-27 – 2023-10-03 (×7): 50 mg via ORAL
  Filled 2023-09-27 (×7): qty 1

## 2023-09-27 MED ORDER — MAGNESIUM SULFATE 2 GM/50ML IV SOLN
2.0000 g | Freq: Once | INTRAVENOUS | Status: AC
  Administered 2023-09-27: 2 g via INTRAVENOUS
  Filled 2023-09-27: qty 50

## 2023-09-27 MED ORDER — EMPAGLIFLOZIN 10 MG PO TABS
10.0000 mg | ORAL_TABLET | Freq: Every day | ORAL | Status: DC
  Administered 2023-09-27 – 2023-10-02 (×6): 10 mg via ORAL
  Filled 2023-09-27 (×6): qty 1

## 2023-09-27 MED ORDER — PROPRANOLOL HCL 10 MG PO TABS
10.0000 mg | ORAL_TABLET | Freq: Four times a day (QID) | ORAL | Status: DC
  Administered 2023-09-27 – 2023-09-28 (×5): 10 mg via ORAL
  Filled 2023-09-27 (×6): qty 1

## 2023-09-27 MED ORDER — DOFETILIDE 125 MCG PO CAPS
125.0000 ug | ORAL_CAPSULE | Freq: Two times a day (BID) | ORAL | Status: DC
  Administered 2023-09-27 – 2023-10-04 (×14): 125 ug via ORAL
  Filled 2023-09-27 (×18): qty 1

## 2023-09-27 MED ORDER — SPIRONOLACTONE 25 MG PO TABS
25.0000 mg | ORAL_TABLET | Freq: Every day | ORAL | Status: DC
  Administered 2023-09-27: 25 mg via ORAL
  Filled 2023-09-27 (×2): qty 1

## 2023-09-27 MED ORDER — IODINE STRONG (LUGOLS) 5 % PO SOLN
0.4000 mL | Freq: Three times a day (TID) | ORAL | Status: AC
  Administered 2023-09-27: 0.4 mL via ORAL
  Filled 2023-09-27: qty 0.4

## 2023-09-27 NOTE — Plan of Care (Signed)
  Problem: Education: Goal: Knowledge of General Education information will improve Description: Including pain rating scale, medication(s)/side effects and non-pharmacologic comfort measures Outcome: Progressing   Problem: Clinical Measurements: Goal: Ability to maintain clinical measurements within normal limits will improve Outcome: Progressing Goal: Will remain free from infection Outcome: Adequate for Discharge Goal: Diagnostic test results will improve Outcome: Adequate for Discharge Goal: Respiratory complications will improve Outcome: Adequate for Discharge

## 2023-09-27 NOTE — Progress Notes (Addendum)
 PROGRESS NOTE   Tommy Rowe  FMW:[id number]    DOB: Mar 21, 1956    DOA: 09/23/2023  PCP: Darra Hamilton, PA-C   I have briefly reviewed patients previous medical records in [facility].   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, independent, medical history significant for PAF with recurrent episodes of RVR, failed multiple cardioversions-most recent in June 2025, on Eliquis , scheduled for ablation at [facility] 10/30/2023, newly diagnosed hyperthyroidism, chronic combined systolic and diastolic CHF, CAD s/p DES to LAD June 2023, HTN, HLD, LBBB, multiple recent hospitalizations (for this year), 6/4 - 6/5 A-fib with RVR, failed DCCV attempt with 100 J x 2 and 120 J x 1, diagnosed with new hyperthyroidism, reverted to sinus rhythm after Amiodarone  drip and discharged on metoprolol , Eliquis  and methimazole , 6/8 - 6/9: Back with A-fib with RVR, reloaded with amiodarone , converted to sinus rhythm discharged back on metoprolol , 6/18 - 6/19: Again with A-fib with RVR, briefly on amiodarone  drip and discharged on amiodarone , metoprolol ;, developed RVR on 6/22 and took 3 doses of home metoprolol  25-50, heart rate persisted >140 and presented to the ED where he was borderline hypotensive, dyspneic and in chest pain, anxiety, tremors, tenderness in his neck/thyroid .  He was admitted to ICU for thyroid  storm/hyperthyroidism driven A-fib/RVR contributed by amiodarone  as well.  Converted to sinus rhythm after Tikosyn . , Also treating for hyperthyroidism.  Stabilized and care transferred to [facility] on 6/27.  EP Cardiology and AHF team following.   Assessment & Plan:   Paroxysmal A-fib with RVR Precipitated by recently diagnosed hypothyroidism and contributed by amiodarone  that he was started on recent hospitalization Followed by Dr. Debby, EP cardiology at [facility] clinic.  Considering ablation, noted date for 10/30/2023 Failed multiple recent cardioversions EP  Cardiology started Tikosyn  on 6/25 and patient converted to sinus rhythm. Per EP, will need to complete 6 doses of observe Tikosyn  while inpatient before he can discharge.  Last dose would be on 6/20 8 AM and will need EKG post dosing. Continue propranolol  20 Mg 4 times daily and Eliquis .  Hypethyroidism/thyroid  storm TSH undetectable T4: 2.95 > 2.5 > 2.27 > 2 Normal thyroid  ultrasound. Managed by CCM in ICU Completing hydrocortisone  IV 100 mg daily on 6/28 Supposed to complete Lugol's iodine  on 6/27.  PTU was switched to methimazole .  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  TEE without clot. Clinically euvolemic Continue Jardiance 10 Mg daily, Aldactone  12.5 Mg daily, continue propranolol .  Essential hypertension Soft blood pressures.  Continue regimen as above and closely monitor  Hyperlipidemia Continue rosuvastatin .  CAD s/p stent Stable.  Chronic thrombocytopenia Stable.  Body mass index is 25.8 kg/m.   DVT prophylaxis: SCDs Start: 09/23/23 0432     Code Status: Full Code:  Family Communication: Spouse at bedside Disposition:  Status is: Inpatient Remains inpatient appropriate because: Ongoing IV Solu-Medrol , Tikosyn  with close monitoring on telemetry for QTc prolongation.     Consultants:   PCCM [facility] team EP Cardiology  Procedures:     Subjective:  Patient reports that he feels well.  Denies complaints.  Specifically denies chest pain, dyspnea, palpitations, dizziness or lightheadedness.  No neck pain.  Ambulating independently to the bathroom in the halls.  Tolerating diet.  Objective:   Vitals:   09/27/23 0400 09/27/23 0500 09/27/23 0700 09/27/23 0800  BP: (!) 101/58 (!) 95/55 106/72 (!) 101/59  Pulse:      Resp:      Temp:      TempSrc:      SpO2:      Weight:  86.3 kg     Height:        General exam: Middle-age male, well-built and nourished, sitting up comfortably in chair without distress. Respiratory system: Clear to auscultation. Respiratory effort normal. Cardiovascular system: S1 & S2 heard, RRR. No JVD, murmurs, rubs, gallops or clicks. No pedal edema.  Telemetry personally reviewed at bedside: Sinus bradycardia in the 50s, BBB morphology.  Occasionally in the 40s. Gastrointestinal system: Abdomen is nondistended, soft and nontender. No organomegaly or masses felt. Normal bowel sounds heard. Central nervous system: Alert and oriented. No focal neurological deficits. Extremities: Symmetric 5 x 5 power. Skin: No rashes, lesions or ulcers Psychiatry: Judgement and insight appear normal. Mood & affect appropriate.  Neck: No goiter appreciated.    Data Reviewed:   I have personally reviewed following labs and imaging studies   CBC: Recent Labs  Lab 09/25/23 0402 09/26/23 0323 09/27/23 0249  WBC 8.5 6.6 6.5  HGB 13.4 12.5* 12.0*  HCT 39.3 36.2* 35.1*  MCV 92.5 93.1 93.6  PLT 143* 136* 136*    Basic Metabolic Panel: Recent Labs  Lab 09/23/23 0558 09/24/23 0316 09/25/23 0402 09/25/23 1130 09/26/23 0323 09/27/23 0249  NA 135 134* 134* 138 137 137  K 4.0 4.8 4.4 4.5 4.2 4.0  CL 103 102 103 104 105 104  CO2 23 27 23 25 25 25   GLUCOSE 129* 150* 176* 134* 163* 118*  BUN 21 20 32* 29* 26* 30*  CREATININE 0.88 0.97 0.86 0.85 0.86 0.89  CALCIUM  8.7* 8.7* 8.8* 9.1 8.8* 8.6*  MG 2.2 2.1 2.0 2.0 2.0 1.9  PHOS 3.4 2.6 3.5  --  4.2 3.5    Liver Function Tests: Recent Labs  Lab 09/23/23 0201 09/26/23 0323  AST 16 12*  ALT 20 15  ALKPHOS 40 36*  BILITOT 1.6* 0.7  PROT 6.6 4.9*  ALBUMIN 3.4* 2.9*    CBG: Recent Labs  Lab 09/26/23 1641 09/26/23 2127 09/27/23 0624  GLUCAP 106* 114* 96    Microbiology Studies:   Recent Results (from the past 240 hours)  MRSA Next Gen by PCR, Nasal     Status: None   Collection Time:  09/23/23  4:31 AM   Specimen: Nasal Mucosa; Nasal Swab  Result Value Ref Range Status   MRSA by PCR Next Gen NOT DETECTED NOT DETECTED Final    Comment: (NOTE) The GeneXpert MRSA Assay (FDA approved for NASAL specimens only), is one component of a comprehensive MRSA colonization surveillance program. It is not intended to diagnose MRSA infection nor to guide or monitor treatment for MRSA infections. Test performance is not FDA approved in patients less than 105 years old. Performed at [facility], [address], [place], [place] [address]     Radiology Studies:  No results found.  Scheduled Meds:    apixaban   5 mg Oral BID   Chlorhexidine  Gluconate Cloth  6 each Topical Daily   dofetilide   125 mcg Oral BID   empagliflozin  10 mg Oral Daily   escitalopram   20 mg Oral QHS   folic acid   1 mg Oral Daily   hydrocortisone  sod succinate (SOLU-CORTEF ) inj  100 mg Intravenous Q24H   insulin  aspart  0-15 Units Subcutaneous TID WC   insulin   aspart  0-5 Units Subcutaneous QHS   Iodine  Strong (Lugols)  0.4 mL Oral TID   melatonin  3 mg Oral QHS   methIMAzole   5 mg Oral TID   propranolol   20 mg Oral QID   rosuvastatin   20 mg Oral QHS   sodium chloride  flush  3 mL Intravenous Q12H   spironolactone   12.5 mg Oral Daily   tamsulosin   0.4 mg Oral Daily   thiamine   100 mg Oral Daily    Continuous Infusions:    magnesium  sulfate bolus IVPB       LOS: 4 days     Trenda Mar, MD,  FACP, [facility], [facility]   [facility] [place]      To contact the attending provider between 7A-7P or the covering provider during after hours 7P-7A, please log into the web site [email or web address] and access using universal [place] password for that web site. If you do not have the password, please call the hospital operator.  09/27/2023, 8:52 AM

## 2023-09-27 NOTE — Progress Notes (Addendum)
 EKG from yesterday evening 09/26/2023 reviewed     Shows remains in NSR with stable QTc at 484 ms in V2 (manually corrected for QRS).  Continue  Tikosyn  250 mcg BID.   Potassium4.0 (06/27 0249) Magnesium   1.9 (06/27 0249) Creatinine, ser  0.89 (06/27 0249)   Pt will need to get 6 doses of observed tikosyn  while inpatient before he can discharge.  Last dose would be am of 6/28 and would require follow up EKG post dosing.    Daphne Barrack, NP-C, AGACNP-BC [facility] - Electrophysiology  09/27/2023, 6:41 AM

## 2023-09-27 NOTE — Progress Notes (Signed)
 Advanced Heart Failure Rounding Note  Cardiologist: None  Chief Complaint: A Flutter RVR Hypothyroidism Subjective:   6/24 Failed Cardioversion  6/25 Started on Tikosyn . Converted to SR at 2330  HR briefly up to the 140s this morning while in the bathroom, quickly settled down and back to baseline. A little anxious from that.   Feels great. Family at bedside. Has been ambulating, denies dizziness.   Objective:   Weight Range: 86.3 kg Body mass index is 25.8 kg/m.   Vital Signs:   Temp:  [97.6 F (36.4 C)-98.7 F (37.1 C)] 97.6 F (36.4 C) (06/27 0329) BP: (95-113)/(55-78) 106/72 (06/27 0700) SpO2:  [94 %-98 %] 98 % (06/26 1800) Weight:  [86.3 kg] 86.3 kg (06/27 0500) Last BM Date : 09/25/23  Weight change: Filed Weights   09/25/23 0500 09/26/23 0157 09/27/23 0500  Weight: 86 kg 86.1 kg 86.3 kg   Intake/Output:   Intake/Output Summary (Last 24 hours) at 09/27/2023 0742 Last data filed at 09/26/2023 2124 Gross per 24 hour  Intake 603 ml  Output --  Net 603 ml    Physical Exam  General:  well appearing, sitting up in chair.  No respiratory difficulty Neck:  JVD flat.  Cor: PMI nondisplaced. Regular rate & rhythm. No rubs, gallops or murmurs. Lungs: clear Extremities: no cyanosis, clubbing, rash, edema  Neuro: alert & oriented x 3. Moves all 4 extremities w/o difficulty. Affect pleasant.  Telemetry  NSR 60s 0-4 PVCs/hr (Personally reviewed)    EKG   No new EKG to review Labs    CBC Recent Labs    09/26/23 0323 09/27/23 0249  WBC 6.6 6.5  HGB 12.5* 12.0*  HCT 36.2* 35.1*  MCV 93.1 93.6  PLT 136* 136*   Basic Metabolic Panel Recent Labs    93/73/74 0323 09/27/23 0249  NA 137 137  K 4.2 4.0  CL 105 104  CO2 25 25  GLUCOSE 163* 118*  BUN 26* 30*  CREATININE 0.86 0.89  CALCIUM  8.8* 8.6*  MG 2.0 1.9  PHOS 4.2 3.5   Liver Function Tests Recent Labs    09/26/23 0323  AST 12*  ALT 15  ALKPHOS 36*  BILITOT 0.7  PROT 4.9*  ALBUMIN 2.9*    No results for input(s): LIPASE, AMYLASE in the last 72 hours. Cardiac Enzymes No results for input(s): CKTOTAL, CKMB, CKMBINDEX, TROPONINI in the last 72 hours.  BNP: BNP (last 3 results) Recent Labs    09/04/23 0727  BNP 154.9*    ProBNP (last 3 results) No results for input(s): PROBNP in the last 8760 hours.   D-Dimer No results for input(s): DDIMER in the last 72 hours. Hemoglobin A1C No results for input(s): HGBA1C in the last 72 hours.  Fasting Lipid Panel No results for input(s): CHOL, HDL, LDLCALC, TRIG, CHOLHDL, LDLDIRECT in the last 72 hours. Thyroid  Function Tests Recent Labs    09/25/23 0402  TSH <0.010*  T3FREE 1.8*   Other results:  Imaging   No results found.   Medications:   Scheduled Medications:  apixaban   5 mg Oral BID   Chlorhexidine  Gluconate Cloth  6 each Topical Daily   dofetilide   250 mcg Oral BID   escitalopram   20 mg Oral QHS   folic acid   1 mg Oral Daily   hydrocortisone  sod succinate (SOLU-CORTEF ) inj  100 mg Intravenous Q24H   insulin  aspart  0-15 Units Subcutaneous TID WC   insulin  aspart  0-5 Units Subcutaneous QHS   Iodine   Strong (Lugols)  0.4 mL Oral TID   melatonin  3 mg Oral QHS   methIMAzole   5 mg Oral TID   propranolol   20 mg Oral QID   rosuvastatin   20 mg Oral QHS   sodium chloride  flush  3 mL Intravenous Q12H   spironolactone   12.5 mg Oral Daily   tamsulosin   0.4 mg Oral Daily   thiamine   100 mg Oral Daily    Infusions:  magnesium  sulfate bolus IVPB      PRN Medications: docusate sodium , HYDROmorphone  (DILAUDID ) injection, polyethylene glycol, sodium chloride  flush  Patient Profile  Admitted with A flutter RVR and Thyroid  Storm/ Assessment/Plan  1. A flutter RVR Followed by [facility], Dr Debby, EP and was seen 09/13/23. Considering ablation in July or August.  Discussed previous AA. We are trying to find out.  He is unsure but confirmed Amio and Multaq .    He has had  multiple cardioversion's. Failed to covert at 100J, 150 J.  Has also had ablation x3.  6/24 Failed DC-CV < 1 minute SR. Back in A flutter RVR.  EP started Tikosyn  6/25. Converted to SR 2230. Bradycardic. Tikosyn  dose has been cut back.  - Remains on propanolol. - Continue eliquis  5 mg twice a day.  Follow K /Mag   2 . Thyroid  Storm  TSH, undetectable.  T4, 2.95 -->2.5  Per CCM.  Now off solumedrol. Remains on Propanalol.  Continue methimazole  5 mg TID.    Endocrinology appointment made for 10/03/23.  3. Chronic HFmEF, NICM ---> Acute HFrEf  Suspect tachy mediated.  Echo LVEF down to 20-25% with smoke.  - TEE no clot.  -Appears euvolemic.  - GDMT limited with soft BP.   - Continue spiro 12.5 mg daily - Start SGLT2i today. Denies hx UTI or retention. A1c 5  Continue to ambulate.   Length of Stay: 4  Beckey LITTIE Coe, NP  09/27/2023, 7:42 AM  Advanced Heart Failure Team Pager [phone] (M-F; 7a - 5p)  Please contact [facility] for night-coverage after hours (5p -7a ) and weekends on [email or web address]

## 2023-09-27 NOTE — Progress Notes (Addendum)
 Patient Name: Tommy Rowe Date of Encounter: 09/27/2023  Primary Cardiologist: None Electrophysiologist: None  Interval Summary   RN reports the patient did not get his tikosyn  dose overnight.    The patient is doing well today.  At this time, the patient denies chest pain, shortness of breath, or any new concerns.  Vital Signs    Vitals:   09/27/23 0400 09/27/23 0500 09/27/23 0700 09/27/23 0800  BP: (!) 101/58 (!) 95/55 106/72 (!) 101/59  Pulse:      Resp:      Temp:      TempSrc:      SpO2:      Weight:  86.3 kg    Height:        Intake/Output Summary (Last 24 hours) at 09/27/2023 0847 Last data filed at 09/26/2023 2124 Gross per 24 hour  Intake 603 ml  Output --  Net 603 ml   Filed Weights   09/25/23 0500 09/26/23 0157 09/27/23 0500  Weight: 86 kg 86.1 kg 86.3 kg    Physical Exam    GEN- The patient is well appearing, alert and oriented x 3 today.   Lungs- Clear to ausculation bilaterally, normal work of breathing Cardiac- Regular rate and rhythm, no murmurs, rubs or gallops GI- soft, NT, ND, + BS Extremities- no clubbing or cyanosis. No edema  Telemetry    SB 40's to SR 60's (personally reviewed)  Hospital Course    ISRAEL WERTS is a 68 y.o. male admitted with AFwRVR in the setting of hyperthyroidism.    Assessment & Plan    Symptomatic Paroxysmal Atrial Fibrillation  Atrial Flutter Hx of prior amiodarone  use. Multiple failed DCCV's in recent months. Prior LVEF 45-50% in 08/2021. LVEF 09/23/23 20-25%. Pt converted after 1st dose of 500 mcg Tikosyn .  Dose held on 6/26 pm. Dose reduced to 125 mcg on am of 6/27.    -primary therapy aimed at treatment of hyperthyroidism  -EKG reviewed with Dr. Kennyth, dose reduced to 125 mcg  -continue Eliquis   -K+ 4 / Mg 1.9  -supplement Mg+  -pt's first dose (1 of 6 observed) of Tikosyn  was on 6/25 PM.  Given skipped dose on PM of 6/26, he will need to remain in hospital until Sunday AM after EKG review of 6th  dose on Saturday night.   Secondary Hypercoagulable State  -continue Eliquis  5mg  BID, dose reviewed and appropriate by age / wt   Hyperthyroidism  -per [facility]  -will need close outpatient Endocrine follow up      For questions or updates, please contact [facility] Please consult [email or web address] for contact info under     Signed, Daphne Barrack, NP-C, AGACNP-BC [facility] - Electrophysiology  09/27/2023, 8:47 AM   I have seen, examined the patient, and reviewed the above assessment and plan.    Interval: Tikosyn  was not given last night. Remains in sinus. No acute overnight events. Patient reports feeling relatively well. No new or acute complaints.   General: Well developed, in no acute distress.  Neck: No JVD.  Cardiac: Bradycardic, regular rhythm.  Resp: Normal work of breathing.  Ext: No edema.  Neuro: No gross focal deficits.  Psych: Normal affect.   Cr: 0.89 K: 4.0  EKG: Qtc ~475ms when correcting for wide QRS  Assessment and Plan:  Patient has history of paroxysmal AF dating back several years. Had previously been on amiodarone . Highly symptomatic with rapid rates during episodes. He has subsequently developed hyperthyroidism.  Hyperthyroidism appears  out of proportion to remote history of amiodarone  use.  Hyperthyroidism is likely the driving factor for his difficult to control atrial fibrillation.     We had previously discussed trying to expedite catheter ablation given repeated ED admissions for atrial fibrillation.  Given new concern for thyroid  storm, addressing his thyroid  disease is first priority.  Unlikely to have much success maintaining sinus rhythm until thyroid  can be adequately managed.  Ultimately, he would benefit from catheter ablation as his atrial fibrillation predates his thyroid  disease.  But we will wait until thyroid  issues have been adequately treated.     Given ERAF following cardioversion, we will pursue Tikosyn  loading in  efforts to maintain sinus rhythm as bridge to thyroid  disease treatment and/or catheter ablation.  Patient states that he has not taken oral amiodarone  for months.  He has received IV boluses intermittently when coming to the ER, but suspect his overall exposure in the past 1 to 2 months has been quite low.  We discussed with pharmacy regarding timing of Tikosyn . He received first dose evening of 6/25 and converted to sinus within hours.    #. Symptomatic paroxysmal atrial fibrillation:  #. Atrial flutter: #. Secondary hypercoagulable state due to AF:  #. High risk medication use: - Resume Tikosyn  this morning at 125 mcg with ECG 2 hours after each dose. He will complete loading on Sunday morning. - Continue routine monitoring of kidney function and electrolytes, replete as necessary.  - Continue Eliquis  5mg  BID.   #. Hyperthyroidism:  - Avoiding amiodarone  as above.  Appreciate critical care management assistance in managing hyperthyroidism.  Ultimately, needs endocrinology evaluation and management.  Fonda Kitty, MD 09/27/2023 6:57 PM

## 2023-09-27 NOTE — Progress Notes (Signed)
 Pharmacy: Dofetilide  (Tikosyn ) - Follow Up Assessment and Electrolyte Replacement  Pharmacy consulted to assist in monitoring and replacing electrolytes in this 68 y.o. male admitted on 09/23/2023 undergoing dofetilide  initiation. First dofetilide  dose: 09/25/23 PM. Current dose of dofetilide : 250 mcg every 12 hours.  Labs:    Component Value Date/Time   K 4.0 09/27/2023 0249   MG 1.9 09/27/2023 0249     Plan: Potassium: K >/= 4: No additional supplementation needed  Magnesium : Mg 1.8-2: Give Mg 2 gm IV x1    Patient has not required any potassium replacement, no potassium chloride  recommended for this patient at discharge.  Thank you for allowing pharmacy to participate in this patient's care   Morna Breach, PharmD PGY2 Cardiology Pharmacy Resident 09/27/2023 6:08 AM

## 2023-09-27 NOTE — Plan of Care (Signed)
  Problem: Education: Goal: Knowledge of General Education information will improve Description: Including pain rating scale, medication(s)/side effects and non-pharmacologic comfort measures Outcome: Progressing   Problem: Health Behavior/Discharge Planning: Goal: Ability to manage health-related needs will improve Outcome: Progressing   Problem: Clinical Measurements: Goal: Ability to maintain clinical measurements within normal limits will improve Outcome: Progressing Goal: Will remain free from infection Outcome: Progressing Goal: Diagnostic test results will improve Outcome: Progressing Goal: Respiratory complications will improve Outcome: Progressing Goal: Cardiovascular complication will be avoided Outcome: Progressing   Problem: Activity: Goal: Risk for activity intolerance will decrease Outcome: Progressing   Problem: Nutrition: Goal: Adequate nutrition will be maintained Outcome: Progressing   Problem: Coping: Goal: Level of anxiety will decrease Outcome: Progressing   Problem: Elimination: Goal: Will not experience complications related to bowel motility Outcome: Progressing Goal: Will not experience complications related to urinary retention Outcome: Progressing   Problem: Pain Managment: Goal: General experience of comfort will improve and/or be controlled Outcome: Progressing   Problem: Safety: Goal: Ability to remain free from injury will improve Outcome: Progressing   Problem: Skin Integrity: Goal: Risk for impaired skin integrity will decrease Outcome: Progressing   Problem: Education: Goal: Ability to describe self-care measures that may prevent or decrease complications (Diabetes Survival Skills Education) will improve Outcome: Progressing Goal: Individualized Educational Video(s) Outcome: Progressing   Problem: Coping: Goal: Ability to adjust to condition or change in health will improve Outcome: Progressing   Problem: Fluid  Volume: Goal: Ability to maintain a balanced intake and output will improve Outcome: Progressing   Problem: Health Behavior/Discharge Planning: Goal: Ability to identify and utilize available resources and services will improve Outcome: Progressing Goal: Ability to manage health-related needs will improve Outcome: Progressing   Problem: Metabolic: Goal: Ability to maintain appropriate glucose levels will improve Outcome: Progressing   Problem: Nutritional: Goal: Maintenance of adequate nutrition will improve Outcome: Progressing Goal: Progress toward achieving an optimal weight will improve Outcome: Progressing   Problem: Skin Integrity: Goal: Risk for impaired skin integrity will decrease Outcome: Progressing   Problem: Tissue Perfusion: Goal: Adequacy of tissue perfusion will improve Outcome: Progressing   Problem: Education: Goal: Knowledge of disease or condition will improve Outcome: Progressing Goal: Understanding of medication regimen will improve Outcome: Progressing Goal: Individualized Educational Video(s) Outcome: Progressing   Problem: Activity: Goal: Ability to tolerate increased activity will improve Outcome: Progressing   Problem: Cardiac: Goal: Ability to achieve and maintain adequate cardiopulmonary perfusion will improve Outcome: Progressing   Problem: Health Behavior/Discharge Planning: Goal: Ability to safely manage health-related needs after discharge will improve Outcome: Progressing

## 2023-09-27 NOTE — TOC Progression Note (Signed)
 Transition of Care [facility]) - Progression Note    Patient Details  Name: Tommy Rowe MRN: [id number] Date of Birth: Dec 20, 1955  [facility] CM/SW Contact  [name] Phone Number: [phone] 09/27/2023, 10:06 AM  Clinical Narrative:   HF CSW/NCM will continue to follow and monitor patient for safety dc planning.   TOC will continue following.     Expected Discharge Plan: Home/Self Care Barriers to Discharge: Continued Medical Work up  Expected Discharge Plan and Services       Living arrangements for the past 2 months: Single Family Home                                       Social Determinants of Health (SDOH) Interventions SDOH Screenings   Food Insecurity: No Food Insecurity (09/23/2023)  Housing: Low Risk  (09/23/2023)  Transportation Needs: No Transportation Needs (09/23/2023)  Utilities: Not At Risk (09/23/2023)  Depression (PHQ2-9): Low Risk  (03/12/2022)  Social Connections: Moderately Integrated (09/23/2023)  Tobacco Use: Medium Risk (09/23/2023)    Readmission Risk Interventions     No data to display

## 2023-09-28 DIAGNOSIS — I4891 Unspecified atrial fibrillation: Secondary | ICD-10-CM | POA: Diagnosis not present

## 2023-09-28 DIAGNOSIS — E0591 Thyrotoxicosis, unspecified with thyrotoxic crisis or storm: Secondary | ICD-10-CM | POA: Diagnosis not present

## 2023-09-28 LAB — CBC
HCT: 39.3 % (ref 39.0–52.0)
Hemoglobin: 13.4 g/dL (ref 13.0–17.0)
MCH: 31.9 pg (ref 26.0–34.0)
MCHC: 34.1 g/dL (ref 30.0–36.0)
MCV: 93.6 fL (ref 80.0–100.0)
Platelets: 156 10*3/uL (ref 150–400)
RBC: 4.2 MIL/uL — ABNORMAL LOW (ref 4.22–5.81)
RDW: 12.2 % (ref 11.5–15.5)
WBC: 8.5 10*3/uL (ref 4.0–10.5)
nRBC: 0 % (ref 0.0–0.2)

## 2023-09-28 LAB — BASIC METABOLIC PANEL WITH GFR
Anion gap: 6 (ref 5–15)
BUN: 17 mg/dL (ref 8–23)
CO2: 30 mmol/L (ref 22–32)
Calcium: 8.6 mg/dL — ABNORMAL LOW (ref 8.9–10.3)
Chloride: 104 mmol/L (ref 98–111)
Creatinine, Ser: 0.78 mg/dL (ref 0.61–1.24)
GFR, Estimated: >60 mL/min (ref >60)
Glucose, Bld: 89 mg/dL (ref 70–99)
Potassium: 3.8 mmol/L (ref 3.5–5.1)
Sodium: 140 mmol/L (ref 135–145)

## 2023-09-28 LAB — MAGNESIUM: Magnesium: 2 mg/dL (ref 1.7–2.4)

## 2023-09-28 LAB — GLUCOSE, CAPILLARY
Glucose-Capillary: 144 mg/dL — ABNORMAL HIGH (ref 70–99)
Glucose-Capillary: 120 mg/dL — ABNORMAL HIGH (ref 70–99)
Glucose-Capillary: 149 mg/dL — ABNORMAL HIGH (ref 70–99)
Glucose-Capillary: 115 mg/dL — ABNORMAL HIGH (ref 70–99)

## 2023-09-28 LAB — AMIODARONE LEVEL
Amiodarone Lvl: <100 ng/mL — ABNORMAL LOW (ref 1000–2500)
N-Desethyl-Amiodarone: <100 ng/mL

## 2023-09-28 LAB — T4, FREE: Free T4: 2.12 ng/dL — ABNORMAL HIGH (ref 0.61–1.12)

## 2023-09-28 LAB — PHOSPHORUS: Phosphorus: 4 mg/dL (ref 2.5–4.6)

## 2023-09-28 MED ORDER — LORAZEPAM 0.5 MG PO TABS
0.5000 mg | ORAL_TABLET | Freq: Once | ORAL | Status: AC
  Administered 2023-09-28: 0.5 mg via ORAL
  Filled 2023-09-28: qty 1

## 2023-09-28 MED ORDER — METOPROLOL TARTRATE 25 MG PO TABS
25.0000 mg | ORAL_TABLET | Freq: Two times a day (BID) | ORAL | Status: DC
  Administered 2023-09-29 (×3): 25 mg via ORAL
  Filled 2023-09-28 (×3): qty 1

## 2023-09-28 MED ORDER — METOPROLOL TARTRATE 25 MG PO TABS
25.0000 mg | ORAL_TABLET | Freq: Three times a day (TID) | ORAL | Status: DC
  Administered 2023-09-28: 25 mg via ORAL
  Filled 2023-09-28: qty 1

## 2023-09-28 MED ORDER — PROPRANOLOL HCL 20 MG PO TABS
20.0000 mg | ORAL_TABLET | Freq: Four times a day (QID) | ORAL | Status: DC
  Filled 2023-09-28 (×2): qty 1

## 2023-09-28 MED ORDER — ZOLPIDEM TARTRATE 5 MG PO TABS: 5.0000 mg | ORAL_TABLET | Freq: Once | ORAL | Status: DC

## 2023-09-28 MED ORDER — POTASSIUM CHLORIDE CRYS ER 20 MEQ PO TBCR
40.0000 meq | EXTENDED_RELEASE_TABLET | Freq: Once | ORAL | Status: AC
  Administered 2023-09-28: 40 meq via ORAL
  Filled 2023-09-28: qty 2

## 2023-09-28 NOTE — Progress Notes (Signed)
 Pharmacy: Dofetilide  (Tikosyn ) - Follow Up Assessment and Electrolyte Replacement  Pharmacy consulted to assist in monitoring and replacing electrolytes in this 68 y.o. male admitted on 09/23/2023 undergoing dofetilide  initiation. First dofetilide  dose: 6/25  Labs:    Component Value Date/Time   K 3.8 09/28/2023 0445   MG 2.0 09/28/2023 0445     Plan: Potassium: K 3.8-3.9:  Give KCl 40 mEq po x1   Magnesium : Mg > 2: No additional supplementation needed    Thank you for allowing pharmacy to participate in this patient's care   Jinnie Door, PharmD, BCPS, BCCP Clinical Pharmacist  Please check AMION for all [facility] phone numbers After 10:00 PM, call Main Pharmacy [phone]

## 2023-09-28 NOTE — Progress Notes (Signed)
  Progress Note  Patient Name: Tommy Rowe Date of Encounter: 09/28/2023 [facility] Cardiologist: None   Interval Summary   Went back into atrial fibrillation last night.  No chest pain or shortness of breath but does note palpitations.  No other acute complaints.  Vital Signs Vitals:   09/28/23 0237 09/28/23 0449 09/28/23 0500 09/28/23 0907  BP: 113/70 102/69  105/84  Pulse:  (!) 139    Resp:  20  18  Temp:  98.6 F (37 C)  99.5 F (37.5 C)  TempSrc:  Oral  Oral  SpO2:  99%  99%  Weight:   85.6 kg   Height:        Intake/Output Summary (Last 24 hours) at 09/28/2023 0916 Last data filed at 09/28/2023 0706 Gross per 24 hour  Intake 366 ml  Output --  Net 366 ml      09/28/2023    5:00 AM 09/27/2023    5:00 AM 09/26/2023    1:57 AM  Last 3 Weights  Weight (lbs) 188 lb 11.2 oz 190 lb 4.1 oz 189 lb 13.1 oz  Weight (kg) 85.594 kg 86.3 kg 86.1 kg      Telemetry/ECG  Atrial fibrillation- Personally Reviewed  Physical Exam  GEN: No acute distress.   Neck: No JVD Cardiac: Irregular, tachycardic, no murmurs, rubs, or gallops.  Respiratory: Clear to auscultation bilaterally. GI: Soft, nontender, non-distended  MS: No edema  Assessment & Plan  1.  Persistent atrial fibrillation: Likely exacerbated by thyroid  storm.  He is on dofetilide  125 mcg twice daily.  He is unfortunately gone back into atrial fibrillation.  Tommy Rowe continue dofetilide  as this is his last option to remain in sinus rhythm.  If he does not convert through the weekend, Tommy Rowe require cardioversion on Monday.  As he is tachycardic with lower blood pressures, Tommy Rowe hold Aldactone  and propranolol  and continue metoprolol .  2.  Secondary hypercoagulable state: Continue Eliquis   3.  Hyperthyroidism: Avoiding amiodarone .  Medical therapy per primary team   For questions or updates, please contact [facility] Please consult [email or web address] for contact info under       Signed, Maicy Filip Gladis Norton, MD ]

## 2023-09-28 NOTE — Progress Notes (Signed)
 09/28/23  00:35 2300 dose of propranolol  held & [facility] on call, Terry Hurst, MD contacted regarding SB HR 40's-50's. Verification received to continue holding the 2300 propranolol  for now.   02:12 Call received from CCMD regarding HR in the 140's. Pt asymptomatic & attempting to sleep. 12 lead EKG obtained w/ A-fib RVR rate of 130.   02:29 [facility] provider Donnel, MD paged regarding afib RVR, call back & order received to give 2300 dose of previously held propranolol  & dayshift team will f/u this AM.

## 2023-09-28 NOTE — Progress Notes (Signed)
 PROGRESS NOTE   Tommy Rowe  FMW:[id number]    DOB: 1955-10-04    DOA: 09/23/2023  PCP: Darra Hamilton, PA-C   I have briefly reviewed patients previous medical records in [facility]   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, independent, medical history significant for PAF with recurrent episodes of RVR, failed multiple cardioversions-most recent in June 2025, on Eliquis , scheduled for ablation at [facility] 10/30/2023, newly diagnosed hyperthyroidism, chronic combined systolic and diastolic CHF, CAD s/p DES to LAD June 2023, HTN, HLD, LBBB, multiple recent hospitalizations (for this year), 6/4 - 6/5 A-fib with RVR, failed DCCV attempt with 100 J x 2 and 120 J x 1, diagnosed with new hyperthyroidism, reverted to sinus rhythm after Amiodarone  drip and discharged on metoprolol , Eliquis  and methimazole , 6/8 - 6/9: Back with A-fib with RVR, reloaded with amiodarone , converted to sinus rhythm discharged back on metoprolol , 6/18 - 6/19: Again with A-fib with RVR, briefly on amiodarone  drip and discharged on amiodarone , metoprolol ;, developed RVR on 6/22 and took 3 doses of home metoprolol  25-50, heart rate persisted >140 and presented to the ED where he was borderline hypotensive, dyspneic and in chest pain, anxiety, tremors, tenderness in his neck/thyroid .  He was admitted to ICU for thyroid  storm/hyperthyroidism driven A-fib/RVR contributed by amiodarone  as well.  Converted to sinus rhythm after Tikosyn . , Also treating for hyperthyroidism.  Stabilized and care transferred to [facility] on 6/27.  EP Cardiology and AHF team following.   Assessment & Plan:   Persistent A-fib with RVR Precipitated by recently diagnosed hypothyroidism and contributed by amiodarone  that he was started on recent hospitalization Followed by Dr. Debby, EP cardiology at [facility] clinic.  Considering ablation, noted date for 10/30/2023 Failed multiple recent cardioversions EP  Cardiology started Tikosyn  on 6/25 and patient converted to sinus rhythm. Per EP, will need to complete 6 doses of observed Tikosyn  while inpatient before he can discharge.  Last dose would be on 6/29 AM and will need EKG post dosing. Continue Eliquis . 6/29: Overnight 6/28, was in persistent A-fib from approximately 2:10 AM to 9:30 AM when he reverted to sinus rhythm but has been in and out of A-fib even this morning.  EP Cardiology have stopped propranolol  and started metoprolol  25 Mg 3 times daily. May still need cardioversion on Monday.  Hypethyroidism/thyroid  storm TSH undetectable T4: 2.95 > 2.5 > 2.27 > 2 Normal thyroid  ultrasound. Managed by CCM in ICU Completed IV hydrocortisone  and Lugol's iodine . PTU was switched to methimazole .  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  TEE without clot. Clinically euvolemic Continue Jardiance 10 Mg daily.  Due to soft blood pressures, Aldactone  discontinued by cardiology and propranolol  was switched to Metoprolol .  Essential hypertension Soft blood pressures.  Continue regimen as above and closely monitor  Hyperlipidemia Continue rosuvastatin .  CAD s/p stent Stable.  Chronic thrombocytopenia Resolved today.  Body mass index is 25.59 kg/m.   DVT prophylaxis: SCDs Start: 09/23/23 0432     Code Status: Full Code:  Family Communication: None at bedside. Disposition:  Status is: Inpatient Remains inpatient appropriate because: Going in and out of A-fib, on Tikosyn  with close monitoring on telemetry and EKGs.     Consultants:   PCCM [facility] team EP Cardiology  Procedures:     Subjective:  Does feel when he  goes into A-fib with RVR, has palpitations.  When seen this morning, asymptomatic.  Objective:   Vitals:   09/28/23 0449 09/28/23 0500 09/28/23 0907  09/28/23 1123  BP: 102/69  105/84 94/62  Pulse: (!) 139     Resp: 20  18   Temp: 98.6 F (37 C)  99.5 F (37.5 C) 98.7 F (37.1 C)  TempSrc: Oral  Oral Oral  SpO2: 99%  99% 96%  Weight:  85.6 kg    Height:        General exam: Middle-age male, well-built and nourished, sitting up comfortably in reclining chair. Respiratory system: Clear to auscultation. Respiratory effort normal. Cardiovascular system: S1 & S2 heard, RRR. No JVD, murmurs, rubs, gallops or clicks. No pedal edema.  Telemetry personally reviewed and endings as noted above. Gastrointestinal system: Abdomen is nondistended, soft and nontender. No organomegaly or masses felt. Normal bowel sounds heard. Central nervous system: Alert and oriented. No focal neurological deficits. Extremities: Symmetric 5 x 5 power. Skin: No rashes, lesions or ulcers Psychiatry: Judgement and insight appear normal. Mood & affect appropriate.  Neck: No goiter appreciated.    Data Reviewed:   I have personally reviewed following labs and imaging studies   CBC: Recent Labs  Lab 09/26/23 0323 09/27/23 0249 09/28/23 0445  WBC 6.6 6.5 8.5  HGB 12.5* 12.0* 13.4  HCT 36.2* 35.1* 39.3  MCV 93.1 93.6 93.6  PLT 136* 136* 156    Basic Metabolic Panel: Recent Labs  Lab 09/24/23 0316 09/25/23 0402 09/25/23 1130 09/26/23 0323 09/27/23 0249 09/28/23 0445  NA 134* 134* 138 137 137 140  K 4.8 4.4 4.5 4.2 4.0 3.8  CL 102 103 104 105 104 104  CO2 27 23 25 25 25 30   GLUCOSE 150* 176* 134* 163* 118* 89  BUN 20 32* 29* 26* 30* 17  CREATININE 0.97 0.86 0.85 0.86 0.89 0.78  CALCIUM  8.7* 8.8* 9.1 8.8* 8.6* 8.6*  MG 2.1 2.0 2.0 2.0 1.9 2.0  PHOS 2.6 3.5  --  4.2 3.5 4.0    Liver Function Tests: Recent Labs  Lab 09/23/23 0201 09/26/23 0323  AST 16 12*  ALT 20 15  ALKPHOS 40 36*  BILITOT 1.6* 0.7  PROT 6.6 4.9*  ALBUMIN 3.4* 2.9*    CBG: Recent Labs  Lab 09/27/23 2118 09/28/23 0825 09/28/23 1122  GLUCAP 99 144* 120*     Microbiology Studies:   Recent Results (from the past 240 hours)  MRSA Next Gen by PCR, Nasal     Status: None   Collection Time: 09/23/23  4:31 AM   Specimen: Nasal Mucosa; Nasal Swab  Result Value Ref Range Status   MRSA by PCR Next Gen NOT DETECTED NOT DETECTED Final    Comment: (NOTE) The GeneXpert MRSA Assay (FDA approved for NASAL specimens only), is one component of a comprehensive MRSA colonization surveillance program. It is not intended to diagnose MRSA infection nor to guide or monitor treatment for MRSA infections. Test performance is not FDA approved in patients less than 36 years old. Performed at [facility], [address], [place], [place] [address]     Radiology Studies:  No results found.  Scheduled Meds:    apixaban   5 mg Oral BID   Chlorhexidine  Gluconate Cloth  6 each Topical Daily   dofetilide   125 mcg Oral BID   empagliflozin  10 mg Oral Daily   folic acid   1 mg Oral Daily   insulin  aspart  0-15 Units  Subcutaneous TID WC   insulin  aspart  0-5 Units Subcutaneous QHS   melatonin  3 mg Oral QHS   methIMAzole   5 mg Oral TID   metoprolol  tartrate  25 mg Oral TID   rosuvastatin   20 mg Oral QHS   sertraline  50 mg Oral QHS   sodium chloride  flush  3 mL Intravenous Q12H   tamsulosin   0.4 mg Oral Daily   thiamine   100 mg Oral Daily    Continuous Infusions:       LOS: 5 days     Trenda Mar, MD,  FACP, [facility], [facility], [facility]   [facility]      To contact the attending provider between 7A-7P or the covering provider during after hours 7P-7A, please log into the web site [email or web address] and access using universal [place] password for that web site. If you do not have the password, please call the hospital operator.  09/28/2023, 3:33 PM

## 2023-09-29 ENCOUNTER — Other Ambulatory Visit: Payer: Self-pay

## 2023-09-29 DIAGNOSIS — E0591 Thyrotoxicosis, unspecified with thyrotoxic crisis or storm: Secondary | ICD-10-CM | POA: Diagnosis not present

## 2023-09-29 DIAGNOSIS — I4891 Unspecified atrial fibrillation: Secondary | ICD-10-CM | POA: Diagnosis not present

## 2023-09-29 LAB — CBC
HCT: 39.7 % (ref 39.0–52.0)
Hemoglobin: 13.6 g/dL (ref 13.0–17.0)
MCH: 32 pg (ref 26.0–34.0)
MCHC: 34.3 g/dL (ref 30.0–36.0)
MCV: 93.4 fL (ref 80.0–100.0)
Platelets: 164 10*3/uL (ref 150–400)
RBC: 4.25 MIL/uL (ref 4.22–5.81)
RDW: 12.3 % (ref 11.5–15.5)
WBC: 7 10*3/uL (ref 4.0–10.5)
nRBC: 0 % (ref 0.0–0.2)

## 2023-09-29 LAB — BASIC METABOLIC PANEL WITH GFR
Anion gap: 7 (ref 5–15)
BUN: 20 mg/dL (ref 8–23)
CO2: 28 mmol/L (ref 22–32)
Calcium: 8.3 mg/dL — ABNORMAL LOW (ref 8.9–10.3)
Chloride: 104 mmol/L (ref 98–111)
Creatinine, Ser: 0.89 mg/dL (ref 0.61–1.24)
GFR, Estimated: >60 mL/min (ref >60)
Glucose, Bld: 98 mg/dL (ref 70–99)
Potassium: 4.4 mmol/L (ref 3.5–5.1)
Sodium: 139 mmol/L (ref 135–145)

## 2023-09-29 LAB — GLUCOSE, CAPILLARY
Glucose-Capillary: 175 mg/dL — ABNORMAL HIGH (ref 70–99)
Glucose-Capillary: 128 mg/dL — ABNORMAL HIGH (ref 70–99)
Glucose-Capillary: 94 mg/dL (ref 70–99)
Glucose-Capillary: 157 mg/dL — ABNORMAL HIGH (ref 70–99)
Glucose-Capillary: 134 mg/dL — ABNORMAL HIGH (ref 70–99)

## 2023-09-29 LAB — PHOSPHORUS: Phosphorus: 4.1 mg/dL (ref 2.5–4.6)

## 2023-09-29 LAB — T4, FREE: Free T4: 1.78 ng/dL — ABNORMAL HIGH (ref 0.61–1.12)

## 2023-09-29 LAB — MAGNESIUM: Magnesium: 2.1 mg/dL (ref 1.7–2.4)

## 2023-09-29 MED ORDER — METOPROLOL SUCCINATE ER 25 MG PO TB24
25.0000 mg | ORAL_TABLET | Freq: Two times a day (BID) | ORAL | Status: DC
  Filled 2023-09-29: qty 1

## 2023-09-29 MED ORDER — DIGOXIN 0.25 MG/ML IJ SOLN
0.5000 mg | Freq: Once | INTRAMUSCULAR | Status: AC
  Administered 2023-09-29: 0.5 mg via INTRAVENOUS
  Filled 2023-09-29: qty 2

## 2023-09-29 MED ORDER — DIGOXIN 0.25 MG/ML IJ SOLN
0.2500 mg | Freq: Once | INTRAMUSCULAR | Status: AC
  Administered 2023-09-30: 0.25 mg via INTRAVENOUS
  Filled 2023-09-29: qty 1

## 2023-09-29 MED ORDER — METOPROLOL TARTRATE 25 MG PO TABS
25.0000 mg | ORAL_TABLET | Freq: Once | ORAL | Status: AC
  Administered 2023-09-29: 25 mg via ORAL
  Filled 2023-09-29: qty 1

## 2023-09-29 NOTE — Progress Notes (Signed)
 Patient remains afib 138-170, BP 125/75, sats 97% RA.  Pt reports intermittent chest discomfort. - Dr. Donnel notified of sustained RVR.

## 2023-09-29 NOTE — Progress Notes (Signed)
 RN notified that patient is back in A-fib RVR   heart rate initially was up to 130-140s, went up to 160s, blood pressure is stable   Patient presenting with thyroid  storm and A-fib RVR, EF is 20-25%. He is on Tikosyn , metoprolol . Change metoprolol  to Toprol -XL 25 mg twice daily.  Given thyroid  storm is not a candidate for IV amiodarone . Will give him IV digoxin  500 mcg x 1 now followed by 250 mcg.   Keep him n.p.o. after midnight if he remains in A-fib then cardiovert him tomorrow  Please note he has heart failure cardioversion and ablation x 3 in the past so probably almost going to permanent A-fib and also thyroid  storm complicating it.

## 2023-09-29 NOTE — Progress Notes (Signed)
  Progress Note  Patient Name: Tommy Rowe Date of Encounter: 09/29/2023 [facility] Cardiologist: None   Interval Summary   Continues to have episodes of atrial fibrillation.  Was in atrial fibrillation for 3 hours overnight last night.  Has palpitations when he is in atrial fibrillation.  Has converted back to sinus rhythm.  QTc remained stable.  Vital Signs Vitals:   09/28/23 2257 09/29/23 0115 09/29/23 0346 09/29/23 0809  BP: 111/85  107/71 105/66  Pulse: 62 (!) 132 (!) 57 61  Resp: 16  18   Temp: 97.9 F (36.6 C)  (!) 97.5 F (36.4 C) 97.6 F (36.4 C)  TempSrc: Oral  Oral Oral  SpO2: 96%  98% 99%  Weight:   85.1 kg   Height:        Intake/Output Summary (Last 24 hours) at 09/29/2023 1012 Last data filed at 09/28/2023 2248 Gross per 24 hour  Intake 243 ml  Output --  Net 243 ml      09/29/2023    3:46 AM 09/28/2023    5:00 AM 09/27/2023    5:00 AM  Last 3 Weights  Weight (lbs) 187 lb 11.2 oz 188 lb 11.2 oz 190 lb 4.1 oz  Weight (kg) 85.14 kg 85.594 kg 86.3 kg      Telemetry/ECG  Sinus rhythm with intermittent atrial fibrillation- Personally Reviewed  Physical Exam  GEN: No acute distress.   Neck: No JVD Cardiac: RRR, no murmurs, rubs, or gallops.  Respiratory: Clear to auscultation bilaterally. GI: Soft, nontender, non-distended  MS: No edema  Assessment & Plan   1.  Persistent atrial fibrillation: Likely exacerbated by thyroid  storm.  On dofetilide  125 mcg twice daily.  QTc remained stable.  Has had intermittent episodes of atrial fibrillation.  Kilynn Fitzsimmons hold overnight for further monitoring of his arrhythmia.  He may be able to go home with intermittent episodes of atrial fibrillation and plans for ablation soon.  2.  Secondary hypercoagulable state: Continue Eliquis   3.  Hyperthyroidism: Avoiding amiodarone .  Medical therapy per primary team   For questions or updates, please contact [facility] Please consult [email or web address] for  contact info under       Signed, Arian Mcquitty Gladis Norton, MD

## 2023-09-29 NOTE — Progress Notes (Signed)
 PROGRESS NOTE   Tommy Rowe  FMW:[id number]    DOB: Jan 02, 1956    DOA: 09/23/2023  PCP: Darra Hamilton, PA-C   I have briefly reviewed patients previous medical records in [facility].   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, independent, medical history significant for PAF with recurrent episodes of RVR, failed multiple cardioversions-most recent in June 2025, on Eliquis , scheduled for ablation at [facility] 10/30/2023, newly diagnosed hyperthyroidism, chronic combined systolic and diastolic CHF, CAD s/p DES to LAD June 2023, HTN, HLD, LBBB, multiple recent hospitalizations (for this year), 6/4 - 6/5 A-fib with RVR, failed DCCV attempt with 100 J x 2 and 120 J x 1, diagnosed with new hyperthyroidism, reverted to sinus rhythm after Amiodarone  drip and discharged on metoprolol , Eliquis  and methimazole , 6/8 - 6/9: Back with A-fib with RVR, reloaded with amiodarone , converted to sinus rhythm discharged back on metoprolol , 6/18 - 6/19: Again with A-fib with RVR, briefly on amiodarone  drip and discharged on amiodarone , metoprolol ;, developed RVR on 6/22 and took 3 doses of home metoprolol  25-50, heart rate persisted >140 and presented to the ED where he was borderline hypotensive, dyspneic and in chest pain, anxiety, tremors, tenderness in his neck/thyroid .  He was admitted to ICU for thyroid  storm/hyperthyroidism driven A-fib/RVR contributed by amiodarone  as well.  Converted to sinus rhythm after Tikosyn . , Also treating for hyperthyroidism.  Stabilized and care transferred to [facility] on 6/27.  EP Cardiology and AHF team following.  Continues to be in and out of A-fib with RVR.   Assessment & Plan:   Persistent A-fib with RVR Precipitated by recently diagnosed hypothyroidism and contributed by amiodarone  that he was started on recent hospitalization Followed by Dr. Debby, EP cardiology at [facility] clinic.  Considering ablation, noted date for  10/30/2023 Failed multiple recent cardioversions EP Cardiology started Tikosyn  on 6/25 Continue Eliquis .  Continue metoprolol  tartrate 25 Mg twice daily and Tikosyn  125 Mg twice daily. Over the weekend including last night, was back in A-fib with RVR for several hours, reverted to sinus bradycardia.  Discussed with Dr. Inocencio, DCCV not likely to help.  Hypethyroidism/thyroid  storm TSH undetectable T4: 2.95 > 2.5 > 2.27 > 2 Normal thyroid  ultrasound. Managed by CCM in ICU Completed IV hydrocortisone  and Lugol's iodine . PTU was switched to methimazole .  Currently on methimazole  5 Mg 3 times daily, continue Has endocrinology appointment for 10/03/2023 with Dr. Komal Motwani.  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  TEE without clot. Clinically euvolemic Continue Jardiance 10 Mg daily.  Due to soft blood pressures, Aldactone  discontinued by cardiology and propranolol  was switched to Metoprolol .  Essential hypertension Soft blood pressures.  Continue regimen as above and closely monitor  Hyperlipidemia Continue rosuvastatin .  CAD s/p stent Stable.  Chronic thrombocytopenia Resolved today.  Body mass index is 25.46 kg/m.   DVT prophylaxis: SCDs Start: 09/23/23 0432     Code Status: Full Code:  Family Communication: Spouse at bedside. Disposition:  Status is: Inpatient Remains inpatient appropriate because: Going in and out of A-fib, on Tikosyn  with close monitoring on telemetry and EKGs.     Consultants:   PCCM [facility] team EP Cardiology  Procedures:     Subjective:  Back in A-fib last night, patient does feel it-has some palpitations.  Denies complaints currently.  Objective:   Vitals:   09/29/23 0115 09/29/23 0346 09/29/23 0809 09/29/23 1036  BP:  107/71  105/66 118/81  Pulse: (!) 132 (!) 57 61   Resp:  18    Temp:  (!) 97.5 F (36.4 C) 97.6 F (36.4 C)   TempSrc:  Oral Oral   SpO2:   98% 99%   Weight:  85.1 kg    Height:        General exam: Middle-age male, well-built and nourished, and steadily ambulating in the room without discomfort or distress. Respiratory system: Clear to auscultation. Respiratory effort normal. Cardiovascular system: S1 & S2 heard, RRR. No JVD, murmurs, rubs, gallops or clicks. No pedal edema.  Telemetry personally reviewed: Was in A-fib with RVR up to 120s from 10:40 PM on 6/28 to 2:40 AM on 6/29, since then has been in sinus bradycardia in the 50s. Gastrointestinal system: Abdomen is nondistended, soft and nontender. No organomegaly or masses felt. Normal bowel sounds heard. Central nervous system: Alert and oriented. No focal neurological deficits. Extremities: Symmetric 5 x 5 power. Skin: No rashes, lesions or ulcers Psychiatry: Judgement and insight appear normal. Mood & affect appropriate.  Neck: No goiter appreciated.    Data Reviewed:   I have personally reviewed following labs and imaging studies   CBC: Recent Labs  Lab 09/27/23 0249 09/28/23 0445 09/29/23 0533  WBC 6.5 8.5 7.0  HGB 12.0* 13.4 13.6  HCT 35.1* 39.3 39.7  MCV 93.6 93.6 93.4  PLT 136* 156 164    Basic Metabolic Panel: Recent Labs  Lab 09/25/23 0402 09/25/23 1130 09/26/23 0323 09/27/23 0249 09/28/23 0445 09/29/23 0533  NA 134* 138 137 137 140 139  K 4.4 4.5 4.2 4.0 3.8 4.4  CL 103 104 105 104 104 104  CO2 23 25 25 25 30 28   GLUCOSE 176* 134* 163* 118* 89 98  BUN 32* 29* 26* 30* 17 20  CREATININE 0.86 0.85 0.86 0.89 0.78 0.89  CALCIUM  8.8* 9.1 8.8* 8.6* 8.6* 8.3*  MG 2.0 2.0 2.0 1.9 2.0 2.1  PHOS 3.5  --  4.2 3.5 4.0 4.1    Liver Function Tests: Recent Labs  Lab 09/23/23 0201 09/26/23 0323  AST 16 12*  ALT 20 15  ALKPHOS 40 36*  BILITOT 1.6* 0.7  PROT 6.6 4.9*  ALBUMIN 3.4* 2.9*    CBG: Recent Labs  Lab 09/28/23 1638 09/28/23 2133 09/29/23 0808  GLUCAP 149* 115* 175*    Microbiology Studies:   Recent Results (from the past  240 hours)  MRSA Next Gen by PCR, Nasal     Status: None   Collection Time: 09/23/23  4:31 AM   Specimen: Nasal Mucosa; Nasal Swab  Result Value Ref Range Status   MRSA by PCR Next Gen NOT DETECTED NOT DETECTED Final    Comment: (NOTE) The GeneXpert MRSA Assay (FDA approved for NASAL specimens only), is one component of a comprehensive MRSA colonization surveillance program. It is not intended to diagnose MRSA infection nor to guide or monitor treatment for MRSA infections. Test performance is not FDA approved in patients less than 7 years old. Performed at [facility], [address], [place], [place] [address]     Radiology Studies:  No results found.  Scheduled Meds:    apixaban   5 mg Oral BID   Chlorhexidine  Gluconate Cloth  6 each Topical Daily   dofetilide   125 mcg Oral BID   empagliflozin  10 mg Oral Daily   folic acid   1 mg Oral Daily   insulin  aspart  0-15 Units Subcutaneous TID WC   insulin   aspart  0-5 Units Subcutaneous QHS   melatonin  3 mg Oral QHS   methIMAzole   5 mg Oral TID   metoprolol  tartrate  25 mg Oral BID   rosuvastatin   20 mg Oral QHS   sertraline  50 mg Oral QHS   sodium chloride  flush  3 mL Intravenous Q12H   tamsulosin   0.4 mg Oral Daily   thiamine   100 mg Oral Daily    Continuous Infusions:       LOS: 6 days     Trenda Mar, MD,  FACP, [facility], [facility], [facility]   [facility] [place]      To contact the attending provider between 7A-7P or the covering provider during after hours 7P-7A, please log into the web site [email or web address] and access using universal  password for that web site. If you do not have the password, please call the hospital operator.  09/29/2023, 10:53 AM

## 2023-09-29 NOTE — Progress Notes (Signed)
 Pharmacy: Dofetilide  (Tikosyn ) - Follow Up Assessment and Electrolyte Replacement  Pharmacy consulted to assist in monitoring and replacing electrolytes in this 68 y.o. male admitted on 09/23/2023 undergoing dofetilide  initiation. First dofetilide  dose: 6/25  Labs:    Component Value Date/Time   K 4.4 09/29/2023 0533   MG 2.1 09/29/2023 0533     Plan: Potassium: K >/= 4: No additional supplementation needed  Magnesium : Mg > 2: No additional supplementation needed     Thank you for allowing pharmacy to participate in this patient's care   Jinnie Door, PharmD, BCPS, BCCP Clinical Pharmacist  Please check AMION for all [facility] Pharmacy phone numbers After 10:00 PM, call Main Pharmacy [phone]

## 2023-09-29 NOTE — Progress Notes (Signed)
 Patient sustaining sinus brady 55-58. Orders to hold PM metoprolol .  Patient then converted to rapid atrial fib RVR 120s-130s.  Awaiting metoprolol  order to be re entered for tonight.  Currently unable to pull metoprolol  and administer to patient.

## 2023-09-29 NOTE — Progress Notes (Signed)
 Patient heart rate 170s on rounds, asymptomatic.  SBP 98-112, sats 96% RA.  Dr. Donnel notified and RRT.  PM TIkosyn  administered as well as PO metoprolol  scheduled and additional PP metoprolol  25 mg as ordered by MD.

## 2023-09-30 DIAGNOSIS — I4891 Unspecified atrial fibrillation: Secondary | ICD-10-CM | POA: Diagnosis not present

## 2023-09-30 DIAGNOSIS — E0591 Thyrotoxicosis, unspecified with thyrotoxic crisis or storm: Secondary | ICD-10-CM | POA: Diagnosis not present

## 2023-09-30 LAB — CBC
HCT: 40.2 % (ref 39.0–52.0)
Hemoglobin: 13.7 g/dL (ref 13.0–17.0)
MCH: 31.4 pg (ref 26.0–34.0)
MCHC: 34.1 g/dL (ref 30.0–36.0)
MCV: 92.2 fL (ref 80.0–100.0)
Platelets: 174 10*3/uL (ref 150–400)
RBC: 4.36 MIL/uL (ref 4.22–5.81)
RDW: 12.4 % (ref 11.5–15.5)
WBC: 7.7 10*3/uL (ref 4.0–10.5)
nRBC: 0 % (ref 0.0–0.2)

## 2023-09-30 LAB — PHOSPHORUS: Phosphorus: 3.8 mg/dL (ref 2.5–4.6)

## 2023-09-30 LAB — BASIC METABOLIC PANEL WITH GFR
Anion gap: 8 (ref 5–15)
BUN: 17 mg/dL (ref 8–23)
CO2: 29 mmol/L (ref 22–32)
Calcium: 8.4 mg/dL — ABNORMAL LOW (ref 8.9–10.3)
Chloride: 102 mmol/L (ref 98–111)
Creatinine, Ser: 0.92 mg/dL (ref 0.61–1.24)
GFR, Estimated: >60 mL/min (ref >60)
Glucose, Bld: 98 mg/dL (ref 70–99)
Potassium: 4.1 mmol/L (ref 3.5–5.1)
Sodium: 139 mmol/L (ref 135–145)

## 2023-09-30 LAB — GLUCOSE, CAPILLARY
Glucose-Capillary: 118 mg/dL — ABNORMAL HIGH (ref 70–99)
Glucose-Capillary: 121 mg/dL — ABNORMAL HIGH (ref 70–99)
Glucose-Capillary: 131 mg/dL — ABNORMAL HIGH (ref 70–99)
Glucose-Capillary: 109 mg/dL — ABNORMAL HIGH (ref 70–99)
Glucose-Capillary: 122 mg/dL — ABNORMAL HIGH (ref 70–99)

## 2023-09-30 LAB — T4, FREE: Free T4: 1.79 ng/dL — ABNORMAL HIGH (ref 0.61–1.12)

## 2023-09-30 LAB — MAGNESIUM: Magnesium: 2 mg/dL (ref 1.7–2.4)

## 2023-09-30 MED ORDER — METOPROLOL TARTRATE 25 MG PO TABS
25.0000 mg | ORAL_TABLET | Freq: Four times a day (QID) | ORAL | Status: DC
  Administered 2023-09-30 – 2023-10-04 (×15): 25 mg via ORAL
  Filled 2023-09-30 (×15): qty 1

## 2023-09-30 MED ORDER — METOPROLOL TARTRATE 5 MG/5ML IV SOLN
5.0000 mg | Freq: Once | INTRAVENOUS | Status: AC
  Administered 2023-09-30: 5 mg via INTRAVENOUS
  Filled 2023-09-30: qty 5

## 2023-09-30 MED ORDER — MAGNESIUM SULFATE 2 GM/50ML IV SOLN
2.0000 g | Freq: Once | INTRAVENOUS | Status: AC
  Administered 2023-09-30: 2 g via INTRAVENOUS
  Filled 2023-09-30: qty 50

## 2023-09-30 MED ORDER — METOPROLOL TARTRATE 5 MG/5ML IV SOLN
INTRAVENOUS | Status: AC
  Filled 2023-09-30: qty 5

## 2023-09-30 NOTE — Progress Notes (Signed)
 PROGRESS NOTE   Tommy Rowe  FMW:[id number]    DOB: 1955-09-30    DOA: 09/23/2023  PCP: Darra Hamilton, PA-C   I have briefly reviewed patients previous medical records in [facility].   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, independent, medical history significant for PAF with recurrent episodes of RVR, failed multiple cardioversions-most recent in June 2025, on Eliquis , scheduled for ablation at [facility] 10/30/2023, newly diagnosed hyperthyroidism, chronic combined systolic and diastolic CHF, CAD s/p DES to LAD June 2023, HTN, HLD, LBBB, multiple recent hospitalizations (for this year), 6/4 - 6/5 A-fib with RVR, failed DCCV attempt with 100 J x 2 and 120 J x 1, diagnosed with new hyperthyroidism, reverted to sinus rhythm after Amiodarone  drip and discharged on metoprolol , Eliquis  and methimazole , 6/8 - 6/9: Back with A-fib with RVR, reloaded with amiodarone , converted to sinus rhythm discharged back on metoprolol , 6/18 - 6/19: Again with A-fib with RVR, briefly on amiodarone  drip and discharged on amiodarone , metoprolol ;, developed RVR on 6/22 and took 3 doses of home metoprolol  25-50, heart rate persisted >140 and presented to the ED where he was borderline hypotensive, dyspneic and in chest pain, anxiety, tremors, tenderness in his neck/thyroid .  He was admitted to ICU for thyroid  storm/hyperthyroidism driven A-fib/RVR contributed by amiodarone  as well.  Converted to sinus rhythm after Tikosyn . , Also treating for hyperthyroidism.  Stabilized and care transferred to [facility] on 6/27.  EP Cardiology and AHF team following.  Continues to be in and out of A-fib with RVR.   Assessment & Plan:   Persistent A-fib with RVR, intractable Precipitated by recently diagnosed hypothyroidism and contributed by amiodarone  that he was started on recent hospitalization Followed by Dr. Debby, EP cardiology at [facility] clinic.  Considering ablation, noted date  for 10/30/2023 Failed multiple recent cardioversions EP Cardiology started Tikosyn  on 6/25, has completed load. Continue Eliquis .  Continue metoprolol  tartrate -increased to 25 mg every 6 hours and Tikosyn  125 Mg twice daily. Over the weekend continued with on and off A-fib with symptomatic RVR despite multiple medication adjustments.  Discussed with EP team, considering inpatient A-fib ablation given difficulty to control rate.  Timing not clear,?  7/2.  Hypethyroidism/thyroid  storm TSH undetectable T4: 2.95 > 2.5 > 2.27 > 2 >1.79 Normal thyroid  ultrasound. Managed by CCM in ICU Completed IV hydrocortisone  and Lugol's iodine . PTU was switched to methimazole .  Currently on methimazole  5 Mg 3 times daily, continue Has endocrinology appointment for 10/03/2023 with Dr. [name].  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  TEE without clot. Remains clinically euvolemic Continue Jardiance 10 Mg daily.  Due to soft blood pressures, Aldactone  discontinued by cardiology and propranolol  was switched to Metoprolol .  Essential hypertension Soft blood pressures.  Continue regimen as above and closely monitor  Hyperlipidemia Continue rosuvastatin .  CAD s/p stent Stable.  Chronic thrombocytopenia Resolved today.  Body mass index is 25.5 kg/m.   DVT prophylaxis: SCDs Start: 09/23/23 0432     Code Status: Full Code:  Family Communication: Spouse and granddaughter from [place] , at bedside. Disposition:  Status is: Inpatient Remains inpatient appropriate because: Going in and out of A-fib, on Tikosyn  with close monitoring on telemetry and EKGs.     Consultants:   PCCM [facility] team EP Cardiology  Procedures:     Subjective:  Symptomatic A-fib/palpitations overnight.  No other complaints.  Objective:   Vitals:  09/30/23 0745 09/30/23 0842 09/30/23 1222 09/30/23 1312  BP: (!) 112/100 101/83 (!) 85/62 101/74   Pulse:  (!) 128 88   Resp:  16 16   Temp:  99.5 F (37.5 C) 97.7 F (36.5 C)   TempSrc:  Oral Oral   SpO2:   93%   Weight:      Height:        General exam: Middle-age male, well-built and nourished, sitting up comfortably in bed without distress. Respiratory system: Clear to auscultation.  No increased work of breathing. Cardiovascular system: S1 & S2 heard, RRR. No JVD, murmurs, rubs, gallops or clicks. No pedal edema.  Telemetry personally reviewed: A-fib overnight for several hours with rate in the 130s-140s.  Remained with RVR this morning as well. Gastrointestinal system: Abdomen is nondistended, soft and nontender. No organomegaly or masses felt. Normal bowel sounds heard. Central nervous system: Alert and oriented. No focal neurological deficits. Extremities: Symmetric 5 x 5 power. Skin: No rashes, lesions or ulcers Psychiatry: Judgement and insight appear normal. Mood & affect appropriate.  Neck: No goiter appreciated.    Data Reviewed:   I have personally reviewed following labs and imaging studies   CBC: Recent Labs  Lab 09/28/23 0445 09/29/23 0533 09/30/23 0513  WBC 8.5 7.0 7.7  HGB 13.4 13.6 13.7  HCT 39.3 39.7 40.2  MCV 93.6 93.4 92.2  PLT 156 164 174    Basic Metabolic Panel: Recent Labs  Lab 09/26/23 0323 09/27/23 0249 09/28/23 0445 09/29/23 0533 09/30/23 0513  NA 137 137 140 139 139  K 4.2 4.0 3.8 4.4 4.1  CL 105 104 104 104 102  CO2 25 25 30 28 29   GLUCOSE 163* 118* 89 98 98  BUN 26* 30* 17 20 17   CREATININE 0.86 0.89 0.78 0.89 0.92  CALCIUM  8.8* 8.6* 8.6* 8.3* 8.4*  MG 2.0 1.9 2.0 2.1 2.0  PHOS 4.2 3.5 4.0 4.1 3.8    Liver Function Tests: Recent Labs  Lab 09/26/23 0323  AST 12*  ALT 15  ALKPHOS 36*  BILITOT 0.7  PROT 4.9*  ALBUMIN 2.9*    CBG: Recent Labs  Lab 09/30/23 0607 09/30/23 0823 09/30/23 1220  GLUCAP 118* 121* 131*    Microbiology Studies:   Recent Results (from the past 240 hours)  MRSA Next Gen by PCR,  Nasal     Status: None   Collection Time: 09/23/23  4:31 AM   Specimen: Nasal Mucosa; Nasal Swab  Result Value Ref Range Status   MRSA by PCR Next Gen NOT DETECTED NOT DETECTED Final    Comment: (NOTE) The GeneXpert MRSA Assay (FDA approved for NASAL specimens only), is one component of a comprehensive MRSA colonization surveillance program. It is not intended to diagnose MRSA infection nor to guide or monitor treatment for MRSA infections. Test performance is not FDA approved in patients less than 49 years old. Performed at [facility], [address] [place], [place] [address]     Radiology Studies:  No results found.  Scheduled Meds:    apixaban   5 mg Oral BID   Chlorhexidine  Gluconate Cloth  6 each Topical Daily   dofetilide   125 mcg Oral BID   empagliflozin  10 mg Oral Daily   folic acid   1 mg Oral Daily   insulin  aspart  0-15 Units Subcutaneous TID WC   insulin  aspart  0-5 Units Subcutaneous QHS   melatonin  3 mg Oral QHS   methIMAzole   5 mg Oral  TID   metoprolol  tartrate  25 mg Oral Q6H   rosuvastatin   20 mg Oral QHS   sertraline  50 mg Oral QHS   sodium chloride  flush  3 mL Intravenous Q12H   tamsulosin   0.4 mg Oral Daily   thiamine   100 mg Oral Daily    Continuous Infusions:       LOS: 7 days     Trenda Mar, MD,  FACP, [facility], [facility], [facility]   [facility] [place]      To contact the attending provider between 7A-7P or the covering provider during after hours 7P-7A, please log into the web site [email or web address] and access using universal [place] password for that web site. If you do not have the password, please call the hospital operator.  09/30/2023, 4:10 PM

## 2023-09-30 NOTE — Progress Notes (Signed)
 Pharmacy: Dofetilide  (Tikosyn ) - Follow Up Assessment and Electrolyte Replacement  Pharmacy consulted to assist in monitoring and replacing electrolytes in this 68 y.o. male admitted on 09/23/2023 undergoing dofetilide  initiation. First dofetilide  dose: 6/25.  Labs:    Component Value Date/Time   K 4.1 09/30/2023 0513   MG 2.0 09/30/2023 0513     Plan: Potassium: K >/= 4: No additional supplementation needed  Magnesium : Mg 1.8-2: Give Mg 2 gm IV x1    Thank you for allowing pharmacy to participate in this patient's care,  Suzen Sour, PharmD, BCCCP Clinical Pharmacist  Phone: [phone] 09/30/2023 8:11 AM  Please check AMION for all [facility] Pharmacy phone numbers After 10:00 PM, call Main Pharmacy [phone]

## 2023-09-30 NOTE — Progress Notes (Addendum)
 Patient has converted to SR 60's EKG was done earlier today, manually measured QT 320-336ms, QTc 462-476ms, this is not accounting for his BBB, so QTc is even better QTc in SR using telemetry is  Continue Tikosyn , continue metoprolol  tartrate. Tikosyn  load is completed, no need to continue post dose EKGs  Working on logistics of trying to get an in patient AFib/flutter ablation done prior to discharge  Charlies Arthur, PA-C

## 2023-09-30 NOTE — TOC Progression Note (Signed)
 Transition of Care [facility]) - Progression Note    Patient Details  Name: AHAN EISENBERGER MRN: [id number] Date of Birth: 1955/05/17  [facility]) CM/SW Contact  [name] Phone Number: [phone] 09/30/2023, 11:02 AM  Clinical Narrative:   HF CSW met with patient at bedside. Patient stated that he did not need anything at this time. HF CSW/NCM will continue to follow and monitor for safe dc planning.   TOC will continue following.     Expected Discharge Plan: Home/Self Care Barriers to Discharge: Continued Medical Work up  Expected Discharge Plan and Services       Living arrangements for the past 2 months: Single Family Home                                       Social Determinants of Health (SDOH) Interventions SDOH Screenings   Food Insecurity: No Food Insecurity (09/23/2023)  Housing: Low Risk  (09/23/2023)  Transportation Needs: No Transportation Needs (09/23/2023)  Utilities: Not At Risk (09/23/2023)  Depression (PHQ2-9): Low Risk  (03/12/2022)  Social Connections: Moderately Integrated (09/23/2023)  Tobacco Use: Medium Risk (09/23/2023)    Readmission Risk Interventions     No data to display

## 2023-09-30 NOTE — Progress Notes (Addendum)
 Rounding Note   Patient Name: Tommy Rowe Date of Encounter: 09/30/2023  [facility] Cardiologist: None  Dr. Florencio  Subjective  He feels the AFib, palpitations, in bed feels off but not lightheaded, no CP, SOB  Scheduled Meds:  apixaban   5 mg Oral BID   Chlorhexidine  Gluconate Cloth  6 each Topical Daily   dofetilide   125 mcg Oral BID   empagliflozin  10 mg Oral Daily   folic acid   1 mg Oral Daily   insulin  aspart  0-15 Units Subcutaneous TID WC   insulin  aspart  0-5 Units Subcutaneous QHS   melatonin  3 mg Oral QHS   methIMAzole   5 mg Oral TID   metoprolol  succinate  25 mg Oral BID   rosuvastatin   20 mg Oral QHS   sertraline  50 mg Oral QHS   sodium chloride  flush  3 mL Intravenous Q12H   tamsulosin   0.4 mg Oral Daily   thiamine   100 mg Oral Daily   Continuous Infusions:  PRN Meds: docusate sodium , HYDROmorphone  (DILAUDID ) injection, polyethylene glycol, sodium chloride  flush   Vital Signs  Vitals:   09/29/23 2300 09/30/23 0000 09/30/23 0400 09/30/23 0542  BP: 92/64  119/64   Pulse: (!) 43 (!) 52 73   Resp: 18  20   Temp: 98 F (36.7 C)  98.5 F (36.9 C)   TempSrc: Oral  Oral   SpO2: (!) 87% (!) 88% 95%   Weight:    85.3 kg  Height:        Intake/Output Summary (Last 24 hours) at 09/30/2023 0727 Last data filed at 09/30/2023 0500 Gross per 24 hour  Intake 123 ml  Output 1275 ml  Net -1152 ml      09/30/2023    5:42 AM 09/29/2023    3:46 AM 09/28/2023    5:00 AM  Last 3 Weights  Weight (lbs) 188 lb 187 lb 11.2 oz 188 lb 11.2 oz  Weight (kg) 85.276 kg 85.14 kg 85.594 kg      Telemetry SBr/SR 50's-60 >> back in rapid Afib ~2AM - Personally Reviewed  ECG  AFib 134bpm, manually measured QT , QTc (better given QRS ) - Personally Reviewed  Physical Exam  GEN: No acute distress.   Neck: No JVD Cardiac: irreg-irreg, tachycardic, no murmurs, rubs, or gallops.  Respiratory: CTA b/l. GI: Soft, nontender, non-distended   MS: No edema; No deformity. Neuro:  Nonfocal  Psych: Normal affect   Labs High Sensitivity Troponin:   Recent Labs  Lab 09/18/23 1700 09/18/23 1954 09/23/23 0050 09/23/23 0201 09/23/23 0558  TROPONINIHS 24* 30* 19* 17 17     Chemistry Recent Labs  Lab 09/26/23 0323 09/27/23 0249 09/28/23 0445 09/29/23 0533 09/30/23 0513  NA 137   < > 140 139 139  K 4.2   < > 3.8 4.4 4.1  CL 105   < > 104 104 102  CO2 25   < > 30 28 29   GLUCOSE 163*   < > 89 98 98  BUN 26*   < > 17 20 17   CREATININE 0.86   < > 0.78 0.89 0.92  CALCIUM  8.8*   < > 8.6* 8.3* 8.4*  MG 2.0   < > 2.0 2.1 2.0  PROT 4.9*  --   --   --   --   ALBUMIN 2.9*  --   --   --   --   AST 12*  --   --   --   --  ALT 15  --   --   --   --   ALKPHOS 36*  --   --   --   --   BILITOT 0.7  --   --   --   --   GFRNONAA >60   < > >60 >60 >60  ANIONGAP 7   < > 6 7 8    < > = values in this interval not displayed.    Lipids No results for input(s): CHOL, TRIG, HDL, LABVLDL, LDLCALC, CHOLHDL in the last 168 hours.  Hematology Recent Labs  Lab 09/28/23 0445 09/29/23 0533 09/30/23 0513  WBC 8.5 7.0 7.7  RBC 4.20* 4.25 4.36  HGB 13.4 13.6 13.7  HCT 39.3 39.7 40.2  MCV 93.6 93.4 92.2  MCH 31.9 32.0 31.4  MCHC 34.1 34.3 34.1  RDW 12.2 12.3 12.4  PLT 156 164 174   Thyroid   Recent Labs  Lab 09/25/23 0402 09/26/23 0323 09/30/23 0513  TSH <0.010*  --   --   FREET4 2.54*   < > 1.79*   < > = values in this interval not displayed.    BNPNo results for input(s): BNP, PROBNP in the last 168 hours.  DDimer No results for input(s): DDIMER in the last 168 hours.   Radiology  No results found.  Cardiac Studies   09/23/23: TTE 1. Left ventricular ejection fraction, by estimation, is 20 to 25%. The  left ventricle has severely decreased function. The left ventricle  demonstrates global hypokinesis. The left ventricular internal cavity size  was moderately to severely dilated.  Left ventricular  diastolic parameters are indeterminate.   2. Right ventricular systolic function is moderately reduced. The right  ventricular size is mildly enlarged. There is mildly elevated pulmonary  artery systolic pressure. The estimated right ventricular systolic  pressure is 42.2 mmHg.   3. The mitral valve is degenerative. Moderate to severe mitral valve  regurgitation. Mild mitral stenosis. Restrictive posterior leafleft,  central MR jet. Recommend repeat echo when better compensated.   4. The aortic valve is tricuspid. There is mild calcification of the  aortic valve. Aortic valve regurgitation is trivial. Aortic valve  sclerosis is present, with no evidence of aortic valve stenosis.   5. The inferior vena cava is dilated in size with <50% respiratory  variability, suggesting right atrial pressure of 15 mmHg.   Conclusion(s)/Recommendation(s): Cannot exclude the possibility of apical  thrombus. Large amount of contrast hang up in the apex.    07/29/22 TTEat [facility] showed LVEF improved to 50-55%, mild to moderate MR, mild AI and TR.   NSTEMI/ CAD in 08/2021, cardiac cath at outside facility at the time showed 100% mid LAD 2 and 70% mid LAD 1, 75% D2, and 50% dist RCA. He was treated with DES to mid LAD.  He was noted with moderate LV systolic dysfunction with EF 35-45%    Patient Profile   68 y.o. male w/PMHx of: HTN, HLD, LBBB, hyperthyroidism CAD (stent to LAD 08/2021)  AFib Chronic combined CHF  Admitted with symptomatic rapid AFib/flutter   This is pts third hospital visit for AF RVR in June. His amiodarone  was previously stopped with TSH of 0.017 and free T4 2.21. Seen by Dr. Debby 6/13 and arranged for AF ablation 7/30.  On Multaq  out patient though did get IV amiodarone  here w/presentation of RVR EP brought on board    Assessment & Plan   Paroxysmal AFib CHA2DS2Vasc is 4, on Eliquis , appropriately dosed Tikosyn  load  is completed (dose #8 last night) K+ 4.1 Mag 2.0 Creat  0.92 EKGs reviewed with Dr. Kennyth, QTc stable C/w paroxysms of RVR  CURRENT IN PATIENT med list looks ok from a medication interaction perspective/Tikosyn  He was on lexapro  out patient >>> zoloft here please do not change back to lexapro  without clear plans to f/u EKG closely/EP aware  RVR: Was on propranolol  > lopressor  > Toprol  25mg  BID planned for today Will give dose of IV lopressor  now  Reviewed with Dr. Kennyth > will stay with Metoprolol  tartrate > 25mg  Q6 Pt aware of his AFib in bed, palpitations, makes him feel off Not lightheaded, no CP, SOB      For questions or updates, please contact [facility] Please consult [email or web address] for contact info under     Signed, Charlies Macario Arthur, PA-C  09/30/2023, 7:27 AM    I have seen, examined the patient, and reviewed the above assessment and plan.    Interval: Back in AF with RVR. Otherwise no acute events. Patient symptomatic in AF but reports feeling relatively well.   General: Well developed, in no acute distress.  Neck: No JVD.  Cardiac: Tachycardic, irregular rhythm.  Resp: Normal work of breathing.  Ext: No edema.  Neuro: No gross focal deficits.  Psych: Normal affect.   Cr: 0.92 K: 4.1   EKG: AF with RVR   Assessment and Plan:  Patient has history of paroxysmal AF dating back several years. Had previously been on amiodarone . Highly symptomatic with rapid rates during episodes. He has subsequently developed hyperthyroidism.  Hyperthyroidism appears out of proportion to remote history of amiodarone  use.  Hyperthyroidism is likely the driving factor for his difficult to control atrial fibrillation.     We had previously discussed trying to expedite catheter ablation given repeated ED admissions for atrial fibrillation.  Given new concern for thyroid  storm, addressing his thyroid  disease is first priority.  Unlikely to have much success maintaining sinus rhythm until thyroid  can be adequately managed.   Ultimately, he would benefit from catheter ablation as his atrial fibrillation predates his thyroid  disease.  But we will wait until thyroid  issues have been adequately treated.     Given ERAF following cardioversion, we will pursue Tikosyn  loading in efforts to maintain sinus rhythm as bridge to thyroid  disease treatment and/or catheter ablation.  Patient states that he has not taken oral amiodarone  for months.  He has received IV boluses intermittently when coming to the ER, but suspect his overall exposure in the past 1 to 2 months has been quite low.  We discussed with pharmacy regarding timing of Tikosyn . He received first dose evening of 6/25 and converted to sinus within hours. Unfortunately, had recurrence of AF on Tikosyn  125mcg.   #. Symptomatic paroxysmal atrial fibrillation:  #. Atrial flutter: #. Secondary hypercoagulable state due to AF:  #. High risk medication use: - Continue Tikosyn  this morning at 125 mcg BID. - Continue routine monitoring of kidney function and electrolytes, replete as necessary.  - Continue Eliquis  5mg  BID. - We will see if we can arrange for inpatient AF ablation given difficult to control AF despite AAD therapy and low LVEF, likely tachycardia induced.   #. Hyperthyroidism:  - Avoiding amiodarone  as above.  Appreciate medicine team assistance in managing hyperthyroidism.  Ultimately, needs endocrinology evaluation and management. He has outpt Endocrine appt on July 3.   Fonda Kennyth, MD 09/30/2023 1:51 PM

## 2023-09-30 NOTE — Progress Notes (Signed)
 Advanced Heart Failure Rounding Note  Cardiologist: None  Chief Complaint: AFlutter RVR  Subjective:   6/24: Failed Cardioversion  6/25: Started on Tikosyn . Converted to SR at 2330 6/29: Back in AFL RVR 1900  VR in 140-160s.   Family at bedside. Sitting up in bed. Feeling well. Anxious about return to AFL.   Objective:    Weight Range: 85.3 kg Body mass index is 25.5 kg/m.   Vital Signs:   Temp:  [97.7 F (36.5 C)-98.5 F (36.9 C)] 98.5 F (36.9 C) (06/30 0400) Pulse Rate:  [43-157] 73 (06/30 0400) Resp:  [16-20] 20 (06/30 0400) BP: (92-127)/(62-89) 119/64 (06/30 0400) SpO2:  [87 %-97 %] 95 % (06/30 0400) Weight:  [85.3 kg] 85.3 kg (06/30 0542) Last BM Date : 09/28/23  Weight change: Filed Weights   09/28/23 0500 09/29/23 0346 09/30/23 0542  Weight: 85.6 kg 85.1 kg 85.3 kg   Intake/Output:  Intake/Output Summary (Last 24 hours) at 09/30/2023 0817 Last data filed at 09/30/2023 0500 Gross per 24 hour  Intake 123 ml  Output 1275 ml  Net -1152 ml    Physical Exam   General: Well appearing. No distress on RA Cardiac: JVP flat. S1 and S2 present. No murmurs or rub. Extremities: Warm and dry.  No edema.  Neuro: Alert and oriented x3. Affect pleasant. Moves all extremities without difficulty.  Telemetry   AFL 140-160s since 6/29 1900 (personally reviewed)   EKG    No new EKG to revie  Labs    CBC Recent Labs    09/29/23 0533 09/30/23 0513  WBC 7.0 7.7  HGB 13.6 13.7  HCT 39.7 40.2  MCV 93.4 92.2  PLT 164 174   Basic Metabolic Panel Recent Labs    93/70/74 0533 09/30/23 0513  NA 139 139  K 4.4 4.1  CL 104 102  CO2 28 29  GLUCOSE 98 98  BUN 20 17  CREATININE 0.89 0.92  CALCIUM  8.3* 8.4*  MG 2.1 2.0  PHOS 4.1 3.8   BNP (last 3 results) Recent Labs    09/04/23 0727  BNP 154.9*   Medications:    Scheduled Medications:  metoprolol  tartrate       apixaban   5 mg Oral BID   Chlorhexidine  Gluconate Cloth  6 each Topical Daily    dofetilide   125 mcg Oral BID   empagliflozin  10 mg Oral Daily   folic acid   1 mg Oral Daily   insulin  aspart  0-15 Units Subcutaneous TID WC   insulin  aspart  0-5 Units Subcutaneous QHS   melatonin  3 mg Oral QHS   methIMAzole   5 mg Oral TID   metoprolol  tartrate  25 mg Oral Q6H   rosuvastatin   20 mg Oral QHS   sertraline  50 mg Oral QHS   sodium chloride  flush  3 mL Intravenous Q12H   tamsulosin   0.4 mg Oral Daily   thiamine   100 mg Oral Daily   Infusions:  magnesium  sulfate bolus IVPB     PRN Medications: metoprolol  tartrate, docusate sodium , HYDROmorphone  (DILAUDID ) injection, polyethylene glycol, sodium chloride  flush  Patient Profile   68 y.o. male with history of AF RVR s/p ablation x3 on Eliquis , hyperthyroid, CAD s/p DES to LAD, HTN, and LBBB. Admitted with AFL RVR and Thyroid  Storm.  Assessment/Plan   1. A flutter RVR - Followed by [facility], Dr Debby, EP and was seen 09/13/23. Considering ablation in July or August.  - Discussed previous AF ablation scheduled  7/30. On Multaq .  - He has had multiple cardioversion's. Failed to covert at 100J, 150 J.  Has also had ablation x3.  - 6/24 Failed DC-CV < 1 minute SR. ERAA. - EP started Tikosyn  6/25. Converted to SR with bradycardia. Tikosyn  dose has been cut back.  - 6/29 developed AFL RVR 140-160s again overnight.  - Management per EP: Lopressor  25 mg q6h, Tikosyn  125 mcg bid - Continue eliquis  5 mg bid  2 . Thyroid  Storm  - TSH, undetectable.  - T4 2.54>2.12>1.79 - off solumedrol. Propanolol>Lopressor  - continue methimazole  5 mg tid   3. Chronic HFmEF, NICM > Acute HFrEF - Suspect tachy mediated.  - Echo LVEF down to 20-25% with smoke. TEE no clot.  - Appears euvolemic.  - GDMT limited with soft BP.   - Continue Jardiance 10 mg daily  AHF will continue to follow from a distance. Please re-consult if further assistance needed. Will follow up with [facility] 10/10/23.  Length of Stay: 7  [place] Vandy Fong, NP   09/30/2023, 8:17 AM  Advanced Heart Failure Team Pager [phone] (M-F; 7a - 5p)  Please contact [facility] for night-coverage after hours (5p -7a ) and weekends on [email or web address]

## 2023-09-30 NOTE — Progress Notes (Signed)
 Patient had a brief period of sinus in the 60s went back to afib rvr, IV digoxin  doses administered per orders.

## 2023-09-30 NOTE — Progress Notes (Signed)
 Notified on call provider Morse Clause patient continues afib rvr 120-150,  remains asymptomatic.  SBP 118.  No new orders.

## 2023-10-01 DIAGNOSIS — I4891 Unspecified atrial fibrillation: Secondary | ICD-10-CM | POA: Diagnosis not present

## 2023-10-01 DIAGNOSIS — E0591 Thyrotoxicosis, unspecified with thyrotoxic crisis or storm: Secondary | ICD-10-CM | POA: Diagnosis not present

## 2023-10-01 LAB — BASIC METABOLIC PANEL WITH GFR
Anion gap: 6 (ref 5–15)
BUN: 19 mg/dL (ref 8–23)
CO2: 27 mmol/L (ref 22–32)
Calcium: 8.5 mg/dL — ABNORMAL LOW (ref 8.9–10.3)
Chloride: 104 mmol/L (ref 98–111)
Creatinine, Ser: 0.92 mg/dL (ref 0.61–1.24)
GFR, Estimated: >60 mL/min (ref >60)
Glucose, Bld: 96 mg/dL (ref 70–99)
Potassium: 4.2 mmol/L (ref 3.5–5.1)
Sodium: 137 mmol/L (ref 135–145)

## 2023-10-01 LAB — CBC
HCT: 42.9 % (ref 39.0–52.0)
Hemoglobin: 14.6 g/dL (ref 13.0–17.0)
MCH: 31.5 pg (ref 26.0–34.0)
MCHC: 34 g/dL (ref 30.0–36.0)
MCV: 92.5 fL (ref 80.0–100.0)
Platelets: 185 10*3/uL (ref 150–400)
RBC: 4.64 MIL/uL (ref 4.22–5.81)
RDW: 12.4 % (ref 11.5–15.5)
WBC: 6.4 10*3/uL (ref 4.0–10.5)
nRBC: 0 % (ref 0.0–0.2)

## 2023-10-01 LAB — PHOSPHORUS: Phosphorus: 3.7 mg/dL (ref 2.5–4.6)

## 2023-10-01 LAB — GLUCOSE, CAPILLARY
Glucose-Capillary: 147 mg/dL — ABNORMAL HIGH (ref 70–99)
Glucose-Capillary: 133 mg/dL — ABNORMAL HIGH (ref 70–99)
Glucose-Capillary: 98 mg/dL (ref 70–99)

## 2023-10-01 LAB — MAGNESIUM: Magnesium: 2.1 mg/dL (ref 1.7–2.4)

## 2023-10-01 LAB — T4, FREE: Free T4: 1.54 ng/dL — ABNORMAL HIGH (ref 0.61–1.12)

## 2023-10-01 NOTE — Progress Notes (Signed)
 Pharmacy: Dofetilide  (Tikosyn ) - Follow Up Assessment and Electrolyte Replacement  Pharmacy consulted to assist in monitoring and replacing electrolytes in this 68 y.o. male admitted on 09/23/2023 undergoing dofetilide  initiation. First dofetilide  dose: 6/25.  Labs:    Component Value Date/Time   K 4.2 10/01/2023 0500   MG 2.1 10/01/2023 0500     Plan: Potassium: K >/= 4: No additional supplementation needed  Magnesium : Mg > 2: No additional supplementation needed    Ozell Jamaica, PharmD, BCPS, [facility] Clinical Pharmacist [phone] Please check AMION for all [facility] Pharmacy numbers 10/01/2023

## 2023-10-01 NOTE — Progress Notes (Signed)
 PROGRESS NOTE   Tommy Rowe  FMW:[id number]    DOB: 1955-07-16    DOA: 09/23/2023  PCP: Darra Hamilton, PA-C   I have briefly reviewed patients previous medical records in [facility].   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, independent, medical history significant for PAF with recurrent episodes of RVR, failed multiple cardioversions-most recent in June 2025, on Eliquis , scheduled for ablation at [facility] 10/30/2023, newly diagnosed hyperthyroidism, chronic combined systolic and diastolic CHF, CAD s/p DES to LAD June 2023, HTN, HLD, LBBB, multiple recent hospitalizations (for this year), 6/4 - 6/5 A-fib with RVR, failed DCCV attempt with 100 J x 2 and 120 J x 1, diagnosed with new hyperthyroidism, reverted to sinus rhythm after Amiodarone  drip and discharged on metoprolol , Eliquis  and methimazole , 6/8 - 6/9: Back with A-fib with RVR, reloaded with amiodarone , converted to sinus rhythm discharged back on metoprolol , 6/18 - 6/19: Again with A-fib with RVR, briefly on amiodarone  drip and discharged on amiodarone , metoprolol ;, developed RVR on 6/22 and took 3 doses of home metoprolol  25-50, heart rate persisted >140 and presented to the ED where he was borderline hypotensive, dyspneic and in chest pain, anxiety, tremors, tenderness in his neck/thyroid .  He was admitted to ICU for thyroid  storm/hyperthyroidism driven A-fib/RVR contributed by amiodarone  as well.  Converted to sinus rhythm after Tikosyn . , Also treating for hyperthyroidism.  Stabilized and care transferred to [facility] on 6/27.  EP Cardiology and AHF team following.  Continues to be in and out of A-fib with RVR, now mostly in RVR, planned A-fib/flutter ablation on Thursday 7/3.   Assessment & Plan:   Persistent A-fib with RVR, intractable Precipitated by recently diagnosed hyperthyroidism and contributed by amiodarone  that he was started on recent hospitalization Followed by Dr. Debby, EP  cardiology at [facility] clinic.   Failed multiple recent cardioversions EP Cardiology started Tikosyn  on 6/25, has completed load. Continue Eliquis .  Continue metoprolol  tartrate 25 mg every 6 hours and Tikosyn  125 Mg twice daily. Since the weekend, has been mostly in A-fib with RVR despite several medication adjustments.  EP cardiology closely following and plan a flutter/fib ablation on 7/3.  Hypethyroidism/thyroid  storm TSH undetectable T4: 2.95 > 2.5 > 2.27 > 2 >1.79 >1.54. Normal thyroid  ultrasound. Managed by CCM in ICU Completed IV hydrocortisone  and Lugol's iodine . PTU was switched to methimazole .  Currently on methimazole  5 Mg 3 times daily, continue Has endocrinology appointment for 10/03/2023 with Dr. [name].  This appointment will need to be rescheduled since patient will still be hospitalized at that time.  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  TEE without clot. Remains clinically euvolemic Continue Jardiance 10 Mg daily.  Due to soft blood pressures, Aldactone  discontinued by cardiology and propranolol  was switched to Metoprolol .  Essential hypertension Soft blood pressures.  Continue regimen as above and closely monitor  Hyperlipidemia Continue rosuvastatin .  CAD s/p stent Stable.  Chronic thrombocytopenia Resolved.  Body mass index is 26.82 kg/m.   DVT prophylaxis: SCDs Start: 09/23/23 0432     Code Status: Full Code:  Family Communication: None at bedside. Disposition:  Inpatient appropriate due to ongoing A-fib with RVR, intractable, awaiting ablation 7/3.     Consultants:   PCCM [facility] team EP Cardiology  Procedures:     Subjective:  No new complaints.  Mildly symptomatic with A-fib with RVR/some palpitations.  No dyspnea or chest pain.  No dizziness or lightheadedness.  Objective:   Vitals:   10/01/23 0400 10/01/23 0405 10/01/23 0700 10/01/23 1216  BP: 104/86   114/64   Pulse:   (!) 137 92  Resp:   16 20  Temp: 97.7 F (36.5 C)  98.8 F (37.1 C) 97.6 F (36.4 C)  TempSrc: Oral  Oral Oral  SpO2: 96%  100% 94%  Weight:  89.7 kg    Height:        General exam: Middle-age male, well-built and nourished, sitting up comfortably in bed without distress. Respiratory system: Clear to auscultation.  No increased work of breathing. Cardiovascular system: S1 & S2 heard, RRR. No JVD, murmurs, rubs, gallops or clicks. No pedal edema.  Telemetry personally reviewed: Ongoing A-fib with RVR ranging between 130-150s, had been going on since 5:30 PM on 6/30. Gastrointestinal system: Abdomen is nondistended, soft and nontender. No organomegaly or masses felt. Normal bowel sounds heard. Central nervous system: Alert and oriented. No focal neurological deficits. Extremities: Symmetric 5 x 5 power. Skin: No rashes, lesions or ulcers Psychiatry: Judgement and insight appear normal. Mood & affect appropriate.  Neck: No goiter appreciated.    Data Reviewed:   I have personally reviewed following labs and imaging studies   CBC: Recent Labs  Lab 09/29/23 0533 09/30/23 0513 10/01/23 0500  WBC 7.0 7.7 6.4  HGB 13.6 13.7 14.6  HCT 39.7 40.2 42.9  MCV 93.4 92.2 92.5  PLT 164 174 185    Basic Metabolic Panel: Recent Labs  Lab 09/27/23 0249 09/28/23 0445 09/29/23 0533 09/30/23 0513 10/01/23 0500  NA 137 140 139 139 137  K 4.0 3.8 4.4 4.1 4.2  CL 104 104 104 102 104  CO2 25 30 28 29 27   GLUCOSE 118* 89 98 98 96  BUN 30* 17 20 17 19   CREATININE 0.89 0.78 0.89 0.92 0.92  CALCIUM  8.6* 8.6* 8.3* 8.4* 8.5*  MG 1.9 2.0 2.1 2.0 2.1  PHOS 3.5 4.0 4.1 3.8 3.7    Liver Function Tests: Recent Labs  Lab 09/26/23 0323  AST 12*  ALT 15  ALKPHOS 36*  BILITOT 0.7  PROT 4.9*  ALBUMIN 2.9*    CBG: Recent Labs  Lab 09/30/23 2123 10/01/23 0754 10/01/23 1213  GLUCAP 122* 147* 133*    Microbiology Studies:   Recent Results (from the past 240  hours)  MRSA Next Gen by PCR, Nasal     Status: None   Collection Time: 09/23/23  4:31 AM   Specimen: Nasal Mucosa; Nasal Swab  Result Value Ref Range Status   MRSA by PCR Next Gen NOT DETECTED NOT DETECTED Final    Comment: (NOTE) The GeneXpert MRSA Assay (FDA approved for NASAL specimens only), is one component of a comprehensive MRSA colonization surveillance program. It is not intended to diagnose MRSA infection nor to guide or monitor treatment for MRSA infections. Test performance is not FDA approved in patients less than 40 years old. Performed at [facility], [address], [place], [place] [address]     Radiology Studies:  No results found.  Scheduled Meds:    apixaban   5 mg Oral BID   Chlorhexidine  Gluconate Cloth  6 each Topical Daily   dofetilide   125 mcg Oral BID   empagliflozin  10 mg Oral Daily   folic acid   1 mg Oral Daily   insulin  aspart  0-15 Units Subcutaneous TID WC   insulin  aspart  0-5 Units Subcutaneous QHS   melatonin  3 mg Oral QHS   methIMAzole   5 mg Oral TID   metoprolol  tartrate  25 mg Oral Q6H   rosuvastatin   20 mg Oral QHS   sertraline  50 mg Oral QHS   sodium chloride  flush  3 mL Intravenous Q12H   tamsulosin   0.4 mg Oral Daily   thiamine   100 mg Oral Daily    Continuous Infusions:       LOS: 8 days     Trenda Mar, MD,  FACP, [facility], [facility], [facility]   [facility] [place]      To contact the attending provider between 7A-7P or the covering provider during after hours 7P-7A, please log into the web site [email or web address] and access using universal [place] password for that web site. If you do not have the password, please call the hospital operator.  10/01/2023, 2:36 PM

## 2023-10-01 NOTE — Progress Notes (Cosign Needed Addendum)
 Rounding Note   Patient Name: Tommy Rowe Date of Encounter: 10/01/2023  [facility] Cardiologist: None  Dr. Florencio  Subjective  Unchanged: he feels the AFib, palpitations, in bed feels off but not lightheaded, no CP, SOB  Scheduled Meds:  apixaban   5 mg Oral BID   Chlorhexidine  Gluconate Cloth  6 each Topical Daily   dofetilide   125 mcg Oral BID   empagliflozin  10 mg Oral Daily   folic acid   1 mg Oral Daily   insulin  aspart  0-15 Units Subcutaneous TID WC   insulin  aspart  0-5 Units Subcutaneous QHS   melatonin  3 mg Oral QHS   methIMAzole   5 mg Oral TID   metoprolol  tartrate  25 mg Oral Q6H   rosuvastatin   20 mg Oral QHS   sertraline  50 mg Oral QHS   sodium chloride  flush  3 mL Intravenous Q12H   tamsulosin   0.4 mg Oral Daily   thiamine   100 mg Oral Daily   Continuous Infusions:  PRN Meds: docusate sodium , HYDROmorphone  (DILAUDID ) injection, polyethylene glycol, sodium chloride  flush   Vital Signs  Vitals:   09/30/23 2354 10/01/23 0400 10/01/23 0405 10/01/23 0700  BP: 98/64 104/86  114/64  Pulse:    (!) 137  Resp: 16   16  Temp: 98.6 F (37 C) 97.7 F (36.5 C)  98.8 F (37.1 C)  TempSrc: Oral Oral  Oral  SpO2: 95% 96%  100%  Weight:   89.7 kg   Height:        Intake/Output Summary (Last 24 hours) at 10/01/2023 0824 Last data filed at 10/01/2023 9257 Gross per 24 hour  Intake 723 ml  Output 0 ml  Net 723 ml      10/01/2023    4:05 AM 09/30/2023    5:42 AM 09/29/2023    3:46 AM  Last 3 Weights  Weight (lbs) 197 lb 12 oz 188 lb 187 lb 11.2 oz  Weight (kg) 89.7 kg 85.276 kg 85.14 kg      Telemetry SB/SR 50's-60 in/out though predominantly rapid Afib 130's mostly - Personally Reviewed  ECG  No new EKGs  Physical Exam  unchanged GEN: No acute distress.   Neck: No JVD Cardiac: irreg-irreg, tachycardic, no murmurs, rubs, or gallops.  Respiratory: CTA b/l. GI: Soft, nontender, non-distended  MS: No edema; No deformity. Neuro:   Nonfocal  Psych: Normal affect   Labs High Sensitivity Troponin:   Recent Labs  Lab 09/18/23 1700 09/18/23 1954 09/23/23 0050 09/23/23 0201 09/23/23 0558  TROPONINIHS 24* 30* 19* 17 17     Chemistry Recent Labs  Lab 09/26/23 0323 09/27/23 0249 09/29/23 0533 09/30/23 0513 10/01/23 0500  NA 137   < > 139 139 137  K 4.2   < > 4.4 4.1 4.2  CL 105   < > 104 102 104  CO2 25   < > 28 29 27   GLUCOSE 163*   < > 98 98 96  BUN 26*   < > 20 17 19   CREATININE 0.86   < > 0.89 0.92 0.92  CALCIUM  8.8*   < > 8.3* 8.4* 8.5*  MG 2.0   < > 2.1 2.0 2.1  PROT 4.9*  --   --   --   --   ALBUMIN 2.9*  --   --   --   --   AST 12*  --   --   --   --   ALT 15  --   --   --   --  ALKPHOS 36*  --   --   --   --   BILITOT 0.7  --   --   --   --   GFRNONAA >60   < > >60 >60 >60  ANIONGAP 7   < > 7 8 6    < > = values in this interval not displayed.    Lipids No results for input(s): CHOL, TRIG, HDL, LABVLDL, LDLCALC, CHOLHDL in the last 168 hours.  Hematology Recent Labs  Lab 09/29/23 0533 09/30/23 0513 10/01/23 0500  WBC 7.0 7.7 6.4  RBC 4.25 4.36 4.64  HGB 13.6 13.7 14.6  HCT 39.7 40.2 42.9  MCV 93.4 92.2 92.5  MCH 32.0 31.4 31.5  MCHC 34.3 34.1 34.0  RDW 12.3 12.4 12.4  PLT 164 174 185   Thyroid   Recent Labs  Lab 09/25/23 0402 09/26/23 0323 10/01/23 0500  TSH <0.010*  --   --   FREET4 2.54*   < > 1.54*   < > = values in this interval not displayed.    BNPNo results for input(s): BNP, PROBNP in the last 168 hours.  DDimer No results for input(s): DDIMER in the last 168 hours.   Radiology  No results found.  Cardiac Studies   09/23/23: TTE 1. Left ventricular ejection fraction, by estimation, is 20 to 25%. The  left ventricle has severely decreased function. The left ventricle  demonstrates global hypokinesis. The left ventricular internal cavity size  was moderately to severely dilated.  Left ventricular diastolic parameters are indeterminate.    2. Right ventricular systolic function is moderately reduced. The right  ventricular size is mildly enlarged. There is mildly elevated pulmonary  artery systolic pressure. The estimated right ventricular systolic  pressure is 42.2 mmHg.   3. The mitral valve is degenerative. Moderate to severe mitral valve  regurgitation. Mild mitral stenosis. Restrictive posterior leafleft,  central MR jet. Recommend repeat echo when better compensated.   4. The aortic valve is tricuspid. There is mild calcification of the  aortic valve. Aortic valve regurgitation is trivial. Aortic valve  sclerosis is present, with no evidence of aortic valve stenosis.   5. The inferior vena cava is dilated in size with <50% respiratory  variability, suggesting right atrial pressure of 15 mmHg.   Conclusion(s)/Recommendation(s): Cannot exclude the possibility of apical  thrombus. Large amount of contrast hang up in the apex.    07/29/22 TTE at [facility] showed LVEF improved to 50-55%, mild to moderate MR, mild AI and TR.   NSTEMI/ CAD in 08/2021, cardiac cath at outside facility at the time showed 100% mid LAD 2 and 70% mid LAD 1, 75% D2, and 50% dist RCA. He was treated with DES to mid LAD.  He was noted with moderate LV systolic dysfunction with EF 35-45%    Patient Profile   68 y.o. male w/PMHx of: HTN, HLD, LBBB, hyperthyroidism CAD (stent to LAD 08/2021)  AFib Chronic combined CHF  Admitted with symptomatic rapid AFib/flutter And suspect/felt to be Thyroid  storm  This is pts third hospital visit for AF RVR in June.  His was on amiodarone  was previously though stopped several months (~7-38months)  ago (prior to development of thyroid  issues), he states stopped 2/2 maintaining normal rhythm. Had subsequent recurrence of AFib and started on multaq  recently, following with his with Dr. Debby 6/13 and arranged for AF ablation 7/30.   On Multaq  out patient though did get IV amiodarone  here w/presentation of RVR EP  brought on board  Assessment & Plan   Paroxysmal AFib CHA2DS2Vasc is 4, on Eliquis , appropriately dosed Tikosyn  load is completed C/w paroxysms of RVR  CURRENT IN PATIENT med list looks ok from a medication interaction perspective/Tikosyn  He was on lexapro  out patient >>> zoloft here please do not change back to lexapro  without clear plans to f/u EKG closely/EP aware  RVR: Was on propranolol  > lopressor  > titrated further 6/30 Continue tikosyn  Pt aware of his AFib in bed, palpitations, makes him feel off Not lightheaded, no CP, SOB  Planned for AFib/flutter ablation Thursday 10/03/23 w/Dr. Camnitz Pre-ablation morphology CT may not be ideal given his HRs, I will reach out to CT RN coordinator, will proceed without CT if needed ADDEND: >> given RVR, CT images will be poor quality > will proceed without  2. NICM Appears well compensated AHF team has s/o > will follow out patient  Dr. Kennyth has been bedside  Further with IM /attending service    For questions or updates, please contact [facility] Please consult [email or web address] for contact info under     Signed, Charlies Macario Arthur, PA-C  10/01/2023, 8:24 AM    I have seen, examined the patient, and reviewed the above assessment and plan.    Interval: Atrial fibrillation continues to be paroxysmal.  Some sinus rhythm yesterday and back and age atrial fibrillation currently.  Reports feeling relatively well but is symptomatic with palpitations and A-fib.  Wants to get this addressed before hospital discharge.  No new or acute complaints today.  General: Well developed, in no acute distress.  Neck: No JVD.  Cardiac: Tachycardic, irregular Resp: Normal work of breathing.  Ext: No edema.  Neuro: No gross focal deficits.  Psych: Normal affect.   Assessment and Plan:  Patient has history of paroxysmal AF dating back several years. Had previously been on amiodarone . Highly symptomatic with rapid rates during episodes.  He has subsequently developed hyperthyroidism.  Hyperthyroidism appears out of proportion to remote history of amiodarone  use.  Hyperthyroidism is likely the driving factor for his difficult to control atrial fibrillation.     We had previously discussed trying to expedite catheter ablation given repeated ED admissions for atrial fibrillation.  Given new concern for thyroid  storm, addressing his thyroid  disease is first priority.  Unlikely to have much success maintaining sinus rhythm until thyroid  can be adequately managed.  Ultimately, he would benefit from catheter ablation as his atrial fibrillation predates his thyroid  disease.  But we will wait until thyroid  issues have been adequately treated.     Given ERAF following cardioversion, we will pursue Tikosyn  loading in efforts to maintain sinus rhythm as bridge to thyroid  disease treatment and/or catheter ablation.  Patient states that he has not taken oral amiodarone  for months.  He has received IV boluses intermittently when coming to the ER, but suspect his overall exposure in the past 1 to 2 months has been quite low.  We discussed with pharmacy regarding timing of Tikosyn . He received first dose evening of 6/25 and converted to sinus within hours. Unfortunately, had recurrence of AF on Tikosyn  .   #. Symptomatic paroxysmal atrial fibrillation:  #. Atrial flutter: #. Secondary hypercoagulable state due to AF:  #. High risk medication use: - Continue Tikosyn  125 mcg BID. - Continue routine monitoring of kidney function and electrolytes, replete as necessary.  - Continue Eliquis  5mg  BID. - Catheter ablation for atrial fibrillation and typical atrial flutter on Thursday, 10/03/23.   #. Hyperthyroidism:  - Avoiding amiodarone   as above.  Appreciate medicine team assistance in managing hyperthyroidism.  Ultimately, needs endocrinology evaluation and management. He has outpt Endocrine appt on July 3.  Fonda Kitty, MD, [facility], [facility] Cardiac  Electrophysiology

## 2023-10-02 DIAGNOSIS — I4891 Unspecified atrial fibrillation: Secondary | ICD-10-CM | POA: Diagnosis not present

## 2023-10-02 LAB — T4, FREE: Free T4: 1.67 ng/dL — ABNORMAL HIGH (ref 0.61–1.12)

## 2023-10-02 LAB — BASIC METABOLIC PANEL WITH GFR
Anion gap: 8 (ref 5–15)
BUN: 25 mg/dL — ABNORMAL HIGH (ref 8–23)
CO2: 27 mmol/L (ref 22–32)
Calcium: 8.8 mg/dL — ABNORMAL LOW (ref 8.9–10.3)
Chloride: 102 mmol/L (ref 98–111)
Creatinine, Ser: 0.84 mg/dL (ref 0.61–1.24)
GFR, Estimated: >60 mL/min (ref >60)
Glucose, Bld: 131 mg/dL — ABNORMAL HIGH (ref 70–99)
Potassium: 4.1 mmol/L (ref 3.5–5.1)
Sodium: 137 mmol/L (ref 135–145)

## 2023-10-02 LAB — PHOSPHORUS: Phosphorus: 3.7 mg/dL (ref 2.5–4.6)

## 2023-10-02 LAB — MAGNESIUM: Magnesium: 2 mg/dL (ref 1.7–2.4)

## 2023-10-02 MED ORDER — MAGNESIUM SULFATE 2 GM/50ML IV SOLN
2.0000 g | Freq: Once | INTRAVENOUS | Status: AC
  Administered 2023-10-02: 2 g via INTRAVENOUS
  Filled 2023-10-02: qty 50

## 2023-10-02 NOTE — Progress Notes (Addendum)
 Rounding Note   Patient Name: XAIVIER Rowe Date of Encounter: 10/02/2023  [facility] Cardiologist: None  Dr. Florencio  Subjective  Ambulating in the room without difficulty, at the sink shaving this AM  he feels the AFib, palpitations, in bed feels off but not lightheaded, no CP, SOB  Scheduled Meds:  apixaban   5 mg Oral BID   Chlorhexidine  Gluconate Cloth  6 each Topical Daily   dofetilide   125 mcg Oral BID   empagliflozin  10 mg Oral Daily   folic acid   1 mg Oral Daily   melatonin  3 mg Oral QHS   methIMAzole   5 mg Oral TID   metoprolol  tartrate  25 mg Oral Q6H   rosuvastatin   20 mg Oral QHS   sertraline  50 mg Oral QHS   sodium chloride  flush  3 mL Intravenous Q12H   tamsulosin   0.4 mg Oral Daily   thiamine   100 mg Oral Daily   Continuous Infusions:  PRN Meds: docusate sodium , HYDROmorphone  (DILAUDID ) injection, polyethylene glycol, sodium chloride  flush   Vital Signs  Vitals:   10/01/23 1936 10/01/23 2357 10/02/23 0000 10/02/23 0400  BP: 102/69 98/62 98/62  111/82  Pulse:   (!) 29   Resp:      Temp: 97.9 F (36.6 C) 98 F (36.7 C)  (!) 97.5 F (36.4 C)  TempSrc: Tympanic Oral  Oral  SpO2: 95% 97% 97% 97%  Weight:    92.4 kg  Height:        Intake/Output Summary (Last 24 hours) at 10/02/2023 0726 Last data filed at 10/01/2023 1746 Gross per 24 hour  Intake 363 ml  Output --  Net 363 ml      10/02/2023    4:00 AM 10/01/2023    4:05 AM 09/30/2023    5:42 AM  Last 3 Weights  Weight (lbs) 203 lb 11.3 oz 197 lb 12 oz 188 lb  Weight (kg) 92.4 kg 89.7 kg 85.276 kg      Telemetry SB/SR 50's-60 in/out though predominantly rapid Afib though rates have shown some improvement 110's-130's - Personally Reviewed  ECG  No new EKGs  Physical Exam  Remains unchanged from yesterday GEN: No acute distress.   Neck: No JVD Cardiac: irreg-irreg, tachycardic, no murmurs, rubs, or gallops.  Respiratory: CTA b/l. GI: Soft, nontender, non-distended  MS: No  edema; No deformity. Neuro:  Nonfocal  Psych: Normal affect   Labs High Sensitivity Troponin:   Recent Labs  Lab 09/18/23 1700 09/18/23 1954 09/23/23 0050 09/23/23 0201 09/23/23 0558  TROPONINIHS 24* 30* 19* 17 17     Chemistry Recent Labs  Lab 09/26/23 0323 09/27/23 0249 09/29/23 0533 09/30/23 0513 10/01/23 0500 10/02/23 0414  NA 137   < > 139 139 137  --   K 4.2   < > 4.4 4.1 4.2  --   CL 105   < > 104 102 104  --   CO2 25   < > 28 29 27   --   GLUCOSE 163*   < > 98 98 96  --   BUN 26*   < > 20 17 19   --   CREATININE 0.86   < > 0.89 0.92 0.92  --   CALCIUM  8.8*   < > 8.3* 8.4* 8.5*  --   MG 2.0   < > 2.1 2.0 2.1 2.0  PROT 4.9*  --   --   --   --   --   ALBUMIN  2.9*  --   --   --   --   --   AST 12*  --   --   --   --   --   ALT 15  --   --   --   --   --   ALKPHOS 36*  --   --   --   --   --   BILITOT 0.7  --   --   --   --   --   GFRNONAA >60   < > >60 >60 >60  --   ANIONGAP 7   < > 7 8 6   --    < > = values in this interval not displayed.    Lipids No results for input(s): CHOL, TRIG, HDL, LABVLDL, LDLCALC, CHOLHDL in the last 168 hours.  Hematology Recent Labs  Lab 09/29/23 0533 09/30/23 0513 10/01/23 0500  WBC 7.0 7.7 6.4  RBC 4.25 4.36 4.64  HGB 13.6 13.7 14.6  HCT 39.7 40.2 42.9  MCV 93.4 92.2 92.5  MCH 32.0 31.4 31.5  MCHC 34.3 34.1 34.0  RDW 12.3 12.4 12.4  PLT 164 174 185   Thyroid   Recent Labs  Lab 10/02/23 0414  FREET4 1.67*    BNPNo results for input(s): BNP, PROBNP in the last 168 hours.  DDimer No results for input(s): DDIMER in the last 168 hours.   Radiology  No results found.  Cardiac Studies   09/23/23: TTE 1. Left ventricular ejection fraction, by estimation, is 20 to 25%. The  left ventricle has severely decreased function. The left ventricle  demonstrates global hypokinesis. The left ventricular internal cavity size  was moderately to severely dilated.  Left ventricular diastolic parameters are  indeterminate.   2. Right ventricular systolic function is moderately reduced. The right  ventricular size is mildly enlarged. There is mildly elevated pulmonary  artery systolic pressure. The estimated right ventricular systolic  pressure is 42.2 mmHg.   3. The mitral valve is degenerative. Moderate to severe mitral valve  regurgitation. Mild mitral stenosis. Restrictive posterior leafleft,  central MR jet. Recommend repeat echo when better compensated.   4. The aortic valve is tricuspid. There is mild calcification of the  aortic valve. Aortic valve regurgitation is trivial. Aortic valve  sclerosis is present, with no evidence of aortic valve stenosis.   5. The inferior vena cava is dilated in size with <50% respiratory  variability, suggesting right atrial pressure of 15 mmHg.   Conclusion(s)/Recommendation(s): Cannot exclude the possibility of apical  thrombus. Large amount of contrast hang up in the apex.    07/29/22 TTE at [facility] showed LVEF improved to 50-55%, mild to moderate MR, mild AI and TR.   NSTEMI/ CAD in 08/2021, cardiac cath at outside facility at the time showed 100% mid LAD 2 and 70% mid LAD 1, 75% D2, and 50% dist RCA. He was treated with DES to mid LAD.  He was noted with moderate LV systolic dysfunction with EF 35-45%    Patient Profile   68 y.o. male w/PMHx of: HTN, HLD, LBBB, hyperthyroidism CAD (stent to LAD 08/2021)  AFib Chronic combined CHF  Admitted with symptomatic rapid AFib/flutter And suspect/felt to be Thyroid  storm  This is pts third hospital visit for AF RVR in June.  His was on amiodarone  was previously though stopped several months (~7-25months)  ago (prior to development or knowledge of thyroid  issues), he states was stopped 2/2 maintaining normal rhythm. Had subsequent recurrence of  AFib and started on multaq  recently, following with his with Dr. Debby 6/13 and was arranged for AF ablation 7/30.   On Multaq  out patient though did get IV  amiodarone  here w/presentation of RVR EP brought on board    Assessment & Plan   Paroxysmal AFib CHA2DS2Vasc is 4, on Eliquis , appropriately dosed Tikosyn  load is completed C/w paroxysms of RVR, though rates are improved some  CURRENT IN PATIENT med list looks ok from a medication interaction perspective/Tikosyn  He was on lexapro  out patient >>> zoloft here please do not change back to lexapro  without clear plans to f/u EKG closely/EP aware  RVR: Was on propranolol  > lopressor  > titrated further 6/30 Continue tikosyn  Pt aware of his AFib in bed, palpitations, makes him feel off Not lightheaded, no CP, SOB   T4 has leveled off from yesterday, though remains improved Planned for AFib/flutter ablation tomorrow 10/03/23 w/Dr. Inocencio given RVR, CT images Emylia Latella be poor quality > Ajdin Macke proceed without Dr. Inocencio has been bedside, discussed pprocedure, potential risks/benefots Pt remains agreeable  2. NICM Appears well compensated AHF team has s/o > Link Burgeson follow out patient    Further with IM /attending service    For questions or updates, please contact [facility] Please consult [email or web address] for contact info under     Signed, Charlies Macario Arthur, PA-C  10/02/2023, 7:26 AM    I have seen and examined this patient with Charlies Arthur.  Agree with above, note added to reflect my findings.  Remains in atrial fibrillation/flutter.  Feels palpitations but otherwise no acute complaints.  GEN: No acute distress.   Neck: No JVD Cardiac: Tachycardic, irregular.  Respiratory: normal BS bases bilaterally. GI: Soft, nontender, non-distended  MS: No edema; No deformity. Neuro:  Nonfocal  Skin: warm and dry Psych: Normal affect    Paroxysmal atrial fibrillation/flutter: Dofetilide  load complete.  He is continue to have episodes of atrial fibrillation.  Likely complicated by hyperthyroidism.  His thyroid  is more euvolemic currently and he is having continued episodes.  Due to  that, we Thorsten Climer plan for ablation tomorrow. Nonischemic cardiomyopathy: Likely tachycardia mediated.  Maintenance of sinus rhythm Nechelle Petrizzo be most beneficial.    Ivannia Willhelm M. Meggie Laseter MD 10/02/2023 8:26 AM

## 2023-10-02 NOTE — TOC Progression Note (Signed)
 Transition of Care [facility]) - Progression Note    Patient Details  Name: Tommy Rowe MRN: [id number] Date of Birth: [birth year]  Transition of Care [facility]) CM/SW Contact  [name] Phone Number: [phone] 10/02/2023, 1:12 PM  Clinical Narrative:   HF CSW/NCM will continue to follow and monitor for safe dc planning.    TOC will continue following.     Expected Discharge Plan: Home/Self Care Barriers to Discharge: Continued Medical Work up  Expected Discharge Plan and Services       Living arrangements for the past 2 months: Single Family Home                                       Social Determinants of Health (SDOH) Interventions SDOH Screenings   Food Insecurity: No Food Insecurity (09/23/2023)  Housing: Low Risk  (09/23/2023)  Transportation Needs: No Transportation Needs (09/23/2023)  Utilities: Not At Risk (09/23/2023)  Depression (PHQ2-9): Low Risk  (03/12/2022)  Social Connections: Moderately Integrated (09/23/2023)  Tobacco Use: Medium Risk (09/23/2023)    Readmission Risk Interventions     No data to display

## 2023-10-02 NOTE — Care Management Important Message (Signed)
 Important Message  Patient Details  Name: Tommy Rowe MRN: [id number] Date of Birth: 29-Mar-1956   Important Message Given:  Yes - Medicare IM     Vonzell Arrie Sharps 10/02/2023, 10:27 AM

## 2023-10-02 NOTE — Hospital Course (Addendum)
 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[employer], independent, medical history significant for PAF with recurrent episodes of RVR, failed multiple cardioversions-most recent in June 2025, on Eliquis , scheduled for ablation at [facility] 10/30/2023, newly diagnosed hyperthyroidism, chronic combined systolic and diastolic CHF, CAD s/p DES to LAD June 2023, HTN, HLD, LBBB, multiple recent hospitalizations (for this year),   6/4 - 6/5 A-fib with RVR, failed DCCV attempt with 100 J x 2 and 120 J x 1, diagnosed with new hyperthyroidism, reverted to sinus rhythm after Amiodarone  drip and discharged on metoprolol , Eliquis  and methimazole    6/8 - 6/9: Back with A-fib with RVR, reloaded with amiodarone , converted to sinus rhythm discharged back on metoprolol ,   6/18 - 6/19: Again with A-fib with RVR, briefly on amiodarone  drip and discharged on amiodarone , metoprolol ;,   Developed RVR on 6/22 and took 3 doses of home metoprolol  25-50, heart rate persisted >140 and presented to the ED where he was borderline hypotensive, dyspneic and in chest pain, anxiety, tremors, tenderness in his neck/thyroid .  He was admitted to ICU for thyroid  storm/hyperthyroidism driven A-fib/RVR contributed by amiodarone  as well.  Converted to sinus rhythm after Tikosyn . , Also treating for hyperthyroidism.  Stabilized and care transferred to [facility] on 6/27.  EP Cardiology and AHF team following.  Continues to be in and out of A-fib with RVR, now mostly in RVR, planned A-fib/flutter ablation on Thursday 7/3.   Post ablation patient did well, now remains in normal sinus rhythm.  Cleared by EP cardiology for discharge.   Assessment & Plan:  Principal Problem:   Atrial fibrillation with RVR (HCC) Active Problems:   Atrial flutter with rapid ventricular response (HCC)    Persistent A-fib with RVR, intractable Failed multiple cardioversion and medical treatment.  EP cardiology is following started on  Tikosyn  6/25 along with metoprolol .  Status post ablation 7/3, now in normal sinus rhythm.  Will discharge patient on Tikosyn  125 mcg twice daily, Eliquis  and Toprol -XL.   Hypethyroidism/thyroid  storm TSH undetectable T4: 2.95 > 2.5 > 2.27 > 2 >1.79 >1.54> 1.67>1.47 Normal thyroid  ultrasound. T4 appears to have leveled off. Completed IV hydrocortisone  and Lugol's iodine . PTU was switched to methimazole .  Currently on methimazole  5 Mg 3 times daily.  Has endocrinology appointment for 10/03/2023 with Dr. [name].  This appointment will need to be rescheduled since patient will still be hospitalized at that time.   Acute on chronic HFrEF/NICM Echo with LVEF 20-25% with smoke.  Likely tachycardia mediated Remains clinically euvolemic Management per cardiology team: On Tikosyn , Eliquis , Toprol -XL and Farxiga   Essential hypertension Medications as mentioned above   Hyperlipidemia Continue rosuvastatin .   CAD s/p stent Stable.   Chronic thrombocytopenia Resolved.   Body mass index is 26.82 kg/m.   DVT prophylaxis: SCDs Start: 09/23/23 0432 apixaban  (ELIQUIS ) tablet 5 mg      Code Status: Full Code Family Communication:   Discharge today  Subjective: Seen post ablation, heart rate in 70s.  No complaints, sitting up in the recliner  Examination:  General exam: Appears calm and comfortable  Respiratory system: Clear to auscultation. Respiratory effort normal. Cardiovascular system: Atrial fibrillation/irregularly irregular Gastrointestinal system: Abdomen is nondistended, soft and nontender. No organomegaly or masses felt. Normal bowel sounds heard. Central nervous system: Alert and oriented. No focal neurological deficits. Extremities: Symmetric 5 x 5 power. Skin: No rashes, lesions or ulcers Psychiatry: Judgement and insight appear normal. Mood & affect appropriate.

## 2023-10-02 NOTE — Progress Notes (Signed)
 Pharmacy: Dofetilide  (Tikosyn ) - Follow Up Assessment and Electrolyte Replacement  Pharmacy consulted to assist in monitoring and replacing electrolytes in this 68 y.o. male admitted on 09/23/2023 undergoing dofetilide  initiation. First dofetilide  dose: 6/25.  Labs:    Component Value Date/Time   K 4.1 10/02/2023 0803   MG 2.0 10/02/2023 0414     Plan: Potassium: K >/= 4: No additional supplementation needed  Magnesium : Mg 1.8-2: Give Mg 2 gm IV x1     Tommy Rowe, PharmD, [place], [facility] Clinical Pharmacist [phone] Please check AMION for all [facility] Pharmacy numbers 10/02/2023

## 2023-10-02 NOTE — Progress Notes (Signed)
 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, independent, medical history significant for PAF with recurrent episodes of RVR, failed multiple cardioversions-most recent in June 2025, on Eliquis , scheduled for ablation at [facility] 10/30/2023, newly diagnosed hyperthyroidism, chronic combined systolic and diastolic CHF, CAD s/p DES to LAD June 2023, HTN, HLD, LBBB, multiple recent hospitalizations (for this year),   6/4 - 6/5 A-fib with RVR, failed DCCV attempt with 100 J x 2 and 120 J x 1, diagnosed with new hyperthyroidism, reverted to sinus rhythm after Amiodarone  drip and discharged on metoprolol , Eliquis  and methimazole    6/8 - 6/9: Back with A-fib with RVR, reloaded with amiodarone , converted to sinus rhythm discharged back on metoprolol ,   6/18 - 6/19: Again with A-fib with RVR, briefly on amiodarone  drip and discharged on amiodarone , metoprolol ;,   Developed RVR on 6/22 and took 3 doses of home metoprolol  25-50, heart rate persisted >140 and presented to the ED where he was borderline hypotensive, dyspneic and in chest pain, anxiety, tremors, tenderness in his neck/thyroid .  He was admitted to ICU for thyroid  storm/hyperthyroidism driven A-fib/RVR contributed by amiodarone  as well.  Converted to sinus rhythm after Tikosyn . , Also treating for hyperthyroidism.  Stabilized and care transferred to [facility] on 6/27.  EP Cardiology and AHF team following.  Continues to be in and out of A-fib with RVR, now mostly in RVR, planned A-fib/flutter ablation on Thursday 7/3.    Assessment & Plan:  Principal Problem:   Atrial fibrillation with RVR (HCC) Active Problems:   Atrial flutter with rapid ventricular response (HCC)    Persistent A-fib with RVR, intractable Failed multiple cardioversion and medical treatment.  EP cardiology is following started on  Tikosyn  6/25 along with metoprolol .  Planned ablation 7/3   Hypethyroidism/thyroid  storm TSH undetectable T4: 2.95 > 2.5 > 2.27 > 2 >1.79 >1.54> 1.67 Normal thyroid  ultrasound. T4 appears to have leveled off. Completed IV hydrocortisone  and Lugol's iodine . PTU was switched to methimazole .  Currently on methimazole  5 Mg 3 times daily.  Has endocrinology appointment for 10/03/2023 with Dr. [name].  This appointment will need to be rescheduled since patient will still be hospitalized at that time.   Acute on chronic HFrEF/NICM Echo with LVEF 20-25% with smoke.  Likely tachycardia mediated Remains clinically euvolemic Management per cardiology team   Essential hypertension Soft blood pressures.  Continue regimen as above and closely monitor IV as needed   Hyperlipidemia Continue rosuvastatin .   CAD s/p stent Stable.   Chronic thrombocytopenia Resolved.   Body mass index is 26.82 kg/m.   DVT prophylaxis: SCDs Start: 09/23/23 0432 apixaban  (ELIQUIS ) tablet 5 mg      Code Status: Full Code Family Communication:   Planned cardioversion/ablation by EP   Subjective: Seen at bedside ambulating in the hallway.  Still remains in atrial fibrillation with RVR heart rate in 140s.   Examination:  General exam: Appears calm and comfortable  Respiratory system: Clear to auscultation. Respiratory effort normal. Cardiovascular system: Atrial fibrillation/irregularly irregular Gastrointestinal system: Abdomen is nondistended, soft and nontender. No organomegaly or masses felt. Normal bowel sounds heard. Central nervous system: Alert and oriented. No focal neurological deficits. Extremities: Symmetric 5 x 5 power. Skin: No rashes, lesions or ulcers Psychiatry: Judgement and insight appear normal. Mood & affect appropriate.  Diet Orders (From admission, onward)     Start     Ordered   10/03/23 0001  Diet NPO time specified Except for: Sips with Meds   Diet effective midnight       Question:  Except for  Answer:  Sips with Meds   10/01/23 1238   10/03/23 0001  Diet NPO time specified Except for: Sips with Meds  Diet effective midnight       Question:  Except for  Answer:  Sips with Meds   10/01/23 9167   09/30/23 0804  Diet Heart Room service appropriate? Yes; Fluid consistency: Thin  Diet effective now       Question Answer Comment  Room service appropriate? Yes   Fluid consistency: Thin      09/30/23 0804            Objective: Vitals:   10/02/23 0400 10/02/23 0835 10/02/23 0932 10/02/23 1234  BP: 111/82 97/72 107/77 110/70  Pulse:  (!) 137 84 61  Resp:  20 18 19   Temp: (!) 97.5 F (36.4 C) 99.5 F (37.5 C) 97.8 F (36.6 C) 98.3 F (36.8 C)  TempSrc: Oral Oral Oral Oral  SpO2: 97% 97% 98% 96%  Weight: 92.4 kg     Height:        Intake/Output Summary (Last 24 hours) at 10/02/2023 1351 Last data filed at 10/02/2023 0800 Gross per 24 hour  Intake 480 ml  Output --  Net 480 ml   Filed Weights   09/30/23 0542 10/01/23 0405 10/02/23 0400  Weight: 85.3 kg 89.7 kg 92.4 kg    Scheduled Meds:  apixaban   5 mg Oral BID   Chlorhexidine  Gluconate Cloth  6 each Topical Daily   dofetilide   125 mcg Oral BID   empagliflozin  10 mg Oral Daily   folic acid   1 mg Oral Daily   melatonin  3 mg Oral QHS   methIMAzole   5 mg Oral TID   metoprolol  tartrate  25 mg Oral Q6H   rosuvastatin   20 mg Oral QHS   sertraline  50 mg Oral QHS   sodium chloride  flush  3 mL Intravenous Q12H   tamsulosin   0.4 mg Oral Daily   thiamine   100 mg Oral Daily   Continuous Infusions:  Nutritional status     Body mass index is 27.63 kg/m.  Data Reviewed:   CBC: Recent Labs  Lab 09/27/23 0249 09/28/23 0445 09/29/23 0533 09/30/23 0513 10/01/23 0500  WBC 6.5 8.5 7.0 7.7 6.4  HGB 12.0* 13.4 13.6 13.7 14.6  HCT 35.1* 39.3 39.7 40.2 42.9  MCV 93.6 93.6 93.4 92.2 92.5  PLT 136* 156 164 174 185   Basic Metabolic Panel: Recent Labs  Lab  09/28/23 0445 09/29/23 0533 09/30/23 0513 10/01/23 0500 10/02/23 0414 10/02/23 0803  NA 140 139 139 137  --  137  K 3.8 4.4 4.1 4.2  --  4.1  CL 104 104 102 104  --  102  CO2 30 28 29 27   --  27  GLUCOSE 89 98 98 96  --  131*  BUN 17 20 17 19   --  25*  CREATININE 0.78 0.89 0.92 0.92  --  0.84  CALCIUM  8.6* 8.3* 8.4* 8.5*  --  8.8*  MG 2.0 2.1 2.0 2.1 2.0  --   PHOS 4.0 4.1 3.8 3.7 3.7  --    GFR: Estimated Creatinine Clearance: 94.9 mL/min (by C-G formula based on SCr of 0.84 mg/dL). Liver  Function Tests: Recent Labs  Lab 09/26/23 0323  AST 12*  ALT 15  ALKPHOS 36*  BILITOT 0.7  PROT 4.9*  ALBUMIN 2.9*   No results for input(s): LIPASE, AMYLASE in the last 168 hours. No results for input(s): AMMONIA in the last 168 hours. Coagulation Profile: No results for input(s): INR, PROTIME in the last 168 hours. Cardiac Enzymes: No results for input(s): CKTOTAL, CKMB, CKMBINDEX, TROPONINI in the last 168 hours. BNP (last 3 results) No results for input(s): PROBNP in the last 8760 hours. HbA1C: No results for input(s): HGBA1C in the last 72 hours. CBG: Recent Labs  Lab 09/30/23 1635 09/30/23 2123 10/01/23 0754 10/01/23 1213 10/01/23 1549  GLUCAP 109* 122* 147* 133* 98   Lipid Profile: No results for input(s): CHOL, HDL, LDLCALC, TRIG, CHOLHDL, LDLDIRECT in the last 72 hours. Thyroid  Function Tests: Recent Labs    10/02/23 0414  FREET4 1.67*   Anemia Panel: No results for input(s): VITAMINB12, FOLATE, FERRITIN, TIBC, IRON, RETICCTPCT in the last 72 hours. Sepsis Labs: No results for input(s): PROCALCITON, LATICACIDVEN in the last 168 hours.  Recent Results (from the past 240 hours)  MRSA Next Gen by PCR, Nasal     Status: None   Collection Time: 09/23/23  4:31 AM   Specimen: Nasal Mucosa; Nasal Swab  Result Value Ref Range Status   MRSA by PCR Next Gen NOT DETECTED NOT DETECTED Final    Comment: (NOTE) The  GeneXpert MRSA Assay (FDA approved for NASAL specimens only), is one component of a comprehensive MRSA colonization surveillance program. It is not intended to diagnose MRSA infection nor to guide or monitor treatment for MRSA infections. Test performance is not FDA approved in patients less than 43 years old. Performed at [facility], [address] [place], [place] [address]          Radiology Studies: No results found.         LOS: 9 days   Time spent= 35 mins    Burgess JAYSON Dare, MD [facility]  If 7PM-7AM, please contact night-coverage  10/02/2023, 1:51 PM

## 2023-10-03 ENCOUNTER — Ambulatory Visit: Admitting: "Endocrinology

## 2023-10-03 ENCOUNTER — Telehealth: Payer: Self-pay | Admitting: Cardiology

## 2023-10-03 ENCOUNTER — Telehealth (HOSPITAL_COMMUNITY): Payer: Self-pay | Admitting: Pharmacy Technician

## 2023-10-03 ENCOUNTER — Inpatient Hospital Stay (HOSPITAL_COMMUNITY): Payer: Self-pay | Admitting: Registered Nurse

## 2023-10-03 ENCOUNTER — Encounter (HOSPITAL_COMMUNITY)
Admission: EM | Disposition: A | Discharge status: Home / Self Care | Payer: Self-pay | Source: Home / Self Care | Attending: Internal Medicine

## 2023-10-03 ENCOUNTER — Other Ambulatory Visit (HOSPITAL_COMMUNITY): Payer: Self-pay

## 2023-10-03 DIAGNOSIS — I4891 Unspecified atrial fibrillation: Secondary | ICD-10-CM | POA: Diagnosis not present

## 2023-10-03 DIAGNOSIS — E785 Hyperlipidemia, unspecified: Secondary | ICD-10-CM

## 2023-10-03 DIAGNOSIS — I110 Hypertensive heart disease with heart failure: Secondary | ICD-10-CM

## 2023-10-03 DIAGNOSIS — I483 Typical atrial flutter: Secondary | ICD-10-CM

## 2023-10-03 DIAGNOSIS — I5042 Chronic combined systolic (congestive) and diastolic (congestive) heart failure: Secondary | ICD-10-CM

## 2023-10-03 HISTORY — PX: A-FLUTTER ABLATION: EP1230

## 2023-10-03 HISTORY — PX: ATRIAL FIBRILLATION ABLATION: EP1191

## 2023-10-03 LAB — BASIC METABOLIC PANEL WITH GFR
Anion gap: 6 (ref 5–15)
BUN: 25 mg/dL — ABNORMAL HIGH (ref 8–23)
CO2: 26 mmol/L (ref 22–32)
Calcium: 8.4 mg/dL — ABNORMAL LOW (ref 8.9–10.3)
Chloride: 105 mmol/L (ref 98–111)
Creatinine, Ser: 1.13 mg/dL (ref 0.61–1.24)
GFR, Estimated: >60 mL/min (ref >60)
Glucose, Bld: 102 mg/dL — ABNORMAL HIGH (ref 70–99)
Potassium: 4.5 mmol/L (ref 3.5–5.1)
Sodium: 137 mmol/L (ref 135–145)

## 2023-10-03 LAB — MAGNESIUM: Magnesium: 2 mg/dL (ref 1.7–2.4)

## 2023-10-03 LAB — POCT ACTIVATED CLOTTING TIME: Activated Clotting Time: 268 s

## 2023-10-03 LAB — T4, FREE: Free T4: 1.47 ng/dL — ABNORMAL HIGH (ref 0.61–1.12)

## 2023-10-03 LAB — GLUCOSE, CAPILLARY: Glucose-Capillary: 113 mg/dL — ABNORMAL HIGH (ref 70–99)

## 2023-10-03 LAB — PHOSPHORUS: Phosphorus: 3.8 mg/dL (ref 2.5–4.6)

## 2023-10-03 MED ORDER — FENTANYL CITRATE (PF) 100 MCG/2ML IJ SOLN
INTRAMUSCULAR | Status: AC
  Filled 2023-10-03: qty 2

## 2023-10-03 MED ORDER — ROCURONIUM BROMIDE 10 MG/ML (PF) SYRINGE
PREFILLED_SYRINGE | INTRAVENOUS | Status: DC | PRN
  Administered 2023-10-03: 70 mg via INTRAVENOUS
  Administered 2023-10-03: 20 mg via INTRAVENOUS

## 2023-10-03 MED ORDER — FENTANYL CITRATE (PF) 250 MCG/5ML IJ SOLN
INTRAMUSCULAR | Status: DC | PRN
  Administered 2023-10-03: 100 ug via INTRAVENOUS

## 2023-10-03 MED ORDER — ATROPINE SULFATE 1 MG/ML IV SOLN
INTRAVENOUS | Status: DC | PRN
  Administered 2023-10-03: 1 mg via INTRAVENOUS

## 2023-10-03 MED ORDER — SUGAMMADEX SODIUM 200 MG/2ML IV SOLN
INTRAVENOUS | Status: DC | PRN
  Administered 2023-10-03: 400 mg via INTRAVENOUS

## 2023-10-03 MED ORDER — PHENYLEPHRINE HCL-NACL 20-0.9 MG/250ML-% IV SOLN
INTRAVENOUS | Status: DC | PRN
  Administered 2023-10-03: 60 ug/min via INTRAVENOUS

## 2023-10-03 MED ORDER — PHENYLEPHRINE 80 MCG/ML (10ML) SYRINGE FOR IV PUSH (FOR BLOOD PRESSURE SUPPORT)
PREFILLED_SYRINGE | INTRAVENOUS | Status: DC | PRN
  Administered 2023-10-03 (×2): 160 ug via INTRAVENOUS

## 2023-10-03 MED ORDER — PROPOFOL 10 MG/ML IV BOLUS
INTRAVENOUS | Status: DC | PRN
  Administered 2023-10-03: 50 mg via INTRAVENOUS

## 2023-10-03 MED ORDER — SODIUM CHLORIDE 0.9 % IV SOLN: INTRAVENOUS | Status: DC | PRN

## 2023-10-03 MED ORDER — HEPARIN SODIUM (PORCINE) 1000 UNIT/ML IJ SOLN
INTRAMUSCULAR | Status: DC | PRN
  Administered 2023-10-03: 5000 [IU] via INTRAVENOUS
  Administered 2023-10-03: 14000 [IU] via INTRAVENOUS

## 2023-10-03 MED ORDER — HEPARIN (PORCINE) IN NACL 1000-0.9 UT/500ML-% IV SOLN
INTRAVENOUS | Status: DC | PRN
  Administered 2023-10-03 (×4): 500 mL

## 2023-10-03 MED ORDER — DEXAMETHASONE SODIUM PHOSPHATE 10 MG/ML IJ SOLN
INTRAMUSCULAR | Status: DC | PRN
  Administered 2023-10-03: 10 mg via INTRAVENOUS

## 2023-10-03 MED ORDER — ONDANSETRON HCL 4 MG/2ML IJ SOLN
INTRAMUSCULAR | Status: DC | PRN
  Administered 2023-10-03: 4 mg via INTRAVENOUS

## 2023-10-03 MED ORDER — LIDOCAINE 2% (20 MG/ML) 5 ML SYRINGE
INTRAMUSCULAR | Status: DC | PRN
  Administered 2023-10-03: 60 mg via INTRAVENOUS

## 2023-10-03 MED ORDER — ONDANSETRON HCL 4 MG/2ML IJ SOLN: 4.0000 mg | Freq: Four times a day (QID) | INTRAMUSCULAR | Status: DC | PRN

## 2023-10-03 MED ORDER — ETOMIDATE 2 MG/ML IV SOLN
INTRAVENOUS | Status: DC | PRN
  Administered 2023-10-03: 10 mg via INTRAVENOUS
  Administered 2023-10-03: 2 mg via INTRAVENOUS
  Administered 2023-10-03 (×2): 4 mg via INTRAVENOUS

## 2023-10-03 MED ORDER — SODIUM CHLORIDE 0.9 % IV SOLN
250.0000 mL | INTRAVENOUS | Status: DC | PRN
Start: 2023-10-03 — End: 2023-10-04

## 2023-10-03 MED ORDER — ACETAMINOPHEN 325 MG PO TABS: 650.0000 mg | ORAL_TABLET | ORAL | Status: DC | PRN

## 2023-10-03 MED ORDER — PROTAMINE SULFATE 10 MG/ML IV SOLN
INTRAVENOUS | Status: DC | PRN
  Administered 2023-10-03: 40 mg via INTRAVENOUS

## 2023-10-03 MED ORDER — SODIUM CHLORIDE 0.9% FLUSH: 3.0000 mL | INTRAVENOUS | Status: DC | PRN

## 2023-10-03 NOTE — Progress Notes (Signed)
 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, independent, medical history significant for PAF with recurrent episodes of RVR, failed multiple cardioversions-most recent in June 2025, on Eliquis , scheduled for ablation at [facility] 10/30/2023, newly diagnosed hyperthyroidism, chronic combined systolic and diastolic CHF, CAD s/p DES to LAD June 2023, HTN, HLD, LBBB, multiple recent hospitalizations (for this year),   6/4 - 6/5 A-fib with RVR, failed DCCV attempt with 100 J x 2 and 120 J x 1, diagnosed with new hyperthyroidism, reverted to sinus rhythm after Amiodarone  drip and discharged on metoprolol , Eliquis  and methimazole    6/8 - 6/9: Back with A-fib with RVR, reloaded with amiodarone , converted to sinus rhythm discharged back on metoprolol ,   6/18 - 6/19: Again with A-fib with RVR, briefly on amiodarone  drip and discharged on amiodarone , metoprolol ;,   Developed RVR on 6/22 and took 3 doses of home metoprolol  25-50, heart rate persisted >140 and presented to the ED where he was borderline hypotensive, dyspneic and in chest pain, anxiety, tremors, tenderness in his neck/thyroid .  He was admitted to ICU for thyroid  storm/hyperthyroidism driven A-fib/RVR contributed by amiodarone  as well.  Converted to sinus rhythm after Tikosyn . , Also treating for hyperthyroidism.  Stabilized and care transferred to [facility] on 6/27.  EP Cardiology and AHF team following.  Continues to be in and out of A-fib with RVR, now mostly in RVR, planned A-fib/flutter ablation on Thursday 7/3.    Assessment & Plan:  Principal Problem:   Atrial fibrillation with RVR (HCC) Active Problems:   Atrial flutter with rapid ventricular response (HCC)    Persistent A-fib with RVR, intractable Failed multiple cardioversion and medical treatment.  EP cardiology is following started on  Tikosyn  6/25 along with metoprolol .  Planned ablation 7/3   Hypethyroidism/thyroid  storm TSH undetectable T4: 2.95 > 2.5 > 2.27 > 2 >1.79 >1.54> 1.67>1.47 Normal thyroid  ultrasound. T4 appears to have leveled off. Completed IV hydrocortisone  and Lugol's iodine . PTU was switched to methimazole .  Currently on methimazole  5 Mg 3 times daily.  Has endocrinology appointment for 10/03/2023 with Tommy Rowe.  This appointment will need to be rescheduled since patient will still be hospitalized at that time.   Acute on chronic HFrEF/NICM Echo with LVEF 20-25% with smoke.  Likely tachycardia mediated Remains clinically euvolemic Management per cardiology team   Essential hypertension Soft blood pressures.  Continue regimen as above and closely monitor IV as needed   Hyperlipidemia Continue rosuvastatin .   CAD s/p stent Stable.   Chronic thrombocytopenia Resolved.   Body mass index is 26.82 kg/m.   DVT prophylaxis: SCDs Start: 09/23/23 0432 apixaban  (ELIQUIS ) tablet 5 mg      Code Status: Full Code Family Communication:   Planned cardioversion/ablation by EP   Subjective: Seen post ablation, heart rate in 70s.  No complaints  Examination:  General exam: Appears calm and comfortable  Respiratory system: Clear to auscultation. Respiratory effort normal. Cardiovascular system: Atrial fibrillation/irregularly irregular Gastrointestinal system: Abdomen is nondistended, soft and nontender. No organomegaly or masses felt. Normal bowel sounds heard. Central nervous system: Alert and oriented. No focal neurological deficits. Extremities: Symmetric 5 x 5 power. Skin: No rashes, lesions or ulcers Psychiatry: Judgement and insight appear normal. Mood & affect appropriate.                Diet Orders (From admission, onward)  Start     Ordered   10/03/23 1400  Diet Heart Room service appropriate? Yes; Fluid consistency: Thin  Diet effective now       Question  Answer Comment  Room service appropriate? Yes   Fluid consistency: Thin      10/03/23 1359            Objective: Vitals:   10/03/23 1300 10/03/23 1315 10/03/23 1330 10/03/23 1345  BP: 101/64 106/64 96/64 113/67  Pulse: (!) 56 (!) 59 (!) 57 (!) 57  Resp: 15 16 15 14   Temp:    98.1 F (36.7 C)  TempSrc:      SpO2: 98% 99% 98% 98%  Weight:      Height:        Intake/Output Summary (Last 24 hours) at 10/03/2023 1409 Last data filed at 10/03/2023 1146 Gross per 24 hour  Intake 1280 ml  Output 10 ml  Net 1270 ml   Filed Weights   10/01/23 0405 10/02/23 0400 10/03/23 0504  Weight: 89.7 kg 92.4 kg 84 kg    Scheduled Meds:  apixaban   5 mg Oral BID   Chlorhexidine  Gluconate Cloth  6 each Topical Daily   dofetilide   125 mcg Oral BID   folic acid   1 mg Oral Daily   melatonin  3 mg Oral QHS   methIMAzole   5 mg Oral TID   metoprolol  tartrate  25 mg Oral Q6H   rosuvastatin   20 mg Oral QHS   sertraline  50 mg Oral QHS   sodium chloride  flush  3 mL Intravenous Q12H   tamsulosin   0.4 mg Oral Daily   thiamine   100 mg Oral Daily   Continuous Infusions:  sodium chloride       Nutritional status     Body mass index is 25.12 kg/m.  Data Reviewed:   CBC: Recent Labs  Lab 09/27/23 0249 09/28/23 0445 09/29/23 0533 09/30/23 0513 10/01/23 0500  WBC 6.5 8.5 7.0 7.7 6.4  HGB 12.0* 13.4 13.6 13.7 14.6  HCT 35.1* 39.3 39.7 40.2 42.9  MCV 93.6 93.6 93.4 92.2 92.5  PLT 136* 156 164 174 185   Basic Metabolic Panel: Recent Labs  Lab 09/29/23 0533 09/30/23 0513 10/01/23 0500 10/02/23 0414 10/02/23 0803 10/03/23 0414  NA 139 139 137  --  137 137  K 4.4 4.1 4.2  --  4.1 4.5  CL 104 102 104  --  102 105  CO2 28 29 27   --  27 26  GLUCOSE 98 98 96  --  131* 102*  BUN 20 17 19   --  25* 25*  CREATININE 0.89 0.92 0.92  --  0.84 1.13  CALCIUM  8.3* 8.4* 8.5*  --  8.8* 8.4*  MG 2.1 2.0 2.1 2.0  --  2.0  PHOS 4.1 3.8 3.7 3.7  --  3.8   GFR: Estimated Creatinine Clearance:  70.6 mL/min (by C-G formula based on SCr of 1.13 mg/dL). Liver Function Tests: No results for input(s): AST, ALT, ALKPHOS, BILITOT, PROT, ALBUMIN in the last 168 hours. No results for input(s): LIPASE, AMYLASE in the last 168 hours. No results for input(s): AMMONIA in the last 168 hours. Coagulation Profile: No results for input(s): INR, PROTIME in the last 168 hours. Cardiac Enzymes: No results for input(s): CKTOTAL, CKMB, CKMBINDEX, TROPONINI in the last 168 hours. BNP (last 3 results) No results for input(s): PROBNP in the last 8760 hours. HbA1C: No results for input(s): HGBA1C in the last 72 hours. CBG: Recent  Labs  Lab 09/30/23 2123 10/01/23 0754 10/01/23 1213 10/01/23 1549 10/03/23 1233  GLUCAP 122* 147* 133* 98 113*   Lipid Profile: No results for input(s): CHOL, HDL, LDLCALC, TRIG, CHOLHDL, LDLDIRECT in the last 72 hours. Thyroid  Function Tests: Recent Labs    10/03/23 0414  FREET4 1.47*   Anemia Panel: No results for input(s): VITAMINB12, FOLATE, FERRITIN, TIBC, IRON, RETICCTPCT in the last 72 hours. Sepsis Labs: No results for input(s): PROCALCITON, LATICACIDVEN in the last 168 hours.  No results found for this or any previous visit (from the past 240 hours).       Radiology Studies: EP STUDY Result Date: 10/03/2023 SURGEON:  Soyla Norton, MD PREPROCEDURE DIAGNOSES: 1. Persistent atrial fibrillation. 2. Typical atrial flutter POSTPROCEDURE DIAGNOSES: 1. Persistent atrial fibrillation. 2. Typical atrial flutter PROCEDURES: 1. Comprehensive electrophysiologic study. 2. Coronary sinus pacing and recording. 3. Three-dimensional mapping of atrial fibrillation with additional mapping and ablation of a second discrete focus 4. Ablation of atrial fibrillation with additional mapping and ablation of a second discrete focus 5. Intracardiac echocardiography. 6. Transseptal puncture of an intact septum.  INTRODUCTION:  Tommy Rowe is a 68 y.o. male with a history of paroxysmal atrial fibrillation who now presents for EP study and radiofrequency ablation.  The patient reports initially being diagnosed with atrial fibrillation after presenting with symptomatic palpitations and fatgiue. The patient reports increasing frequency and duration of atrial fibrillation since that time.  The patient has failed medical therapy.  The patient therefore presents today for catheter ablation of atrial fibrillation. DESCRIPTION OF PROCEDURE:  Informed written consent was obtained, and the patient was brought to the electrophysiology lab in a fasting state.  The patient was adequately sedated with intravenous medications as outlined in the anesthesia report.  The patient's left and right groins were prepped and draped in the usual sterile fashion by the EP lab staff.  Using a percutaneous Seldinger technique, 1 8-French hemostasis sheaths were placed in the right femoral vein, and one 7 [place] and one 9-French hemostasis sheaths were placed into the left common femoral vein. The right femoral vein sheath site was preclosed using an Abbott Perclose and closed at the end of the case. A Biosense Webster Octarray catheter was advanced into the atrium to make a right atrial map. Direct ultrasound guidance is used for right and left femoral veins with normal vessel patency. Ultrasound images are captured and stored in the patient's chart. Using ultrasound guidance, the Brockenbrough needle and wire were visualized entering the vessel. Catheter Placement:  A 7-French Biosense Webster Decapolar coronary sinus catheter was introduced through the right common femoral vein and advanced into the coronary sinus for recording and pacing from this location.  Initial Measurements: The patient presented to the electrophysiology lab in atrial flutter. his  QRS measured 140 msec and a QT interval of 303 msec. Intracardiac Echocardiography: An  8-French Biosense Webster AcuNav intracardiac echocardiography catheter was introduced through the right common femoral vein and advanced into the right atrium. Intracardiac echocardiography was performed of the left atrium, and a three-dimensional anatomical rendering of the left atrium was performed using CARTO sound technology.  The patient was noted to have a moderate sized left atrium.  The interatrial septum was prominent but not aneurysmal. All 4 pulmonary veins were visualized and noted to have separate ostia.  The pulmonary veins were moderate in size.  The left atrial appendage was visualized and did not reveal thrombus.   There was no evidence of pulmonary vein stenosis.  Transseptal Puncture: The right common femoral vein sheath was exchanged for one [employer] SL 1 sheath and transseptal access was achieved in a standard fashion using a Bayless pigtail wire under fluoroscopy with intracardiac echocardiography confirmation of the transseptal puncture.  Once transseptal access had been achieved, heparin  was administered intravenously and intra- arterially in order to maintain an ACT of greater than 350 seconds throughout the procedure. 3D Mapping and Ablation:  The Bayless sheath was removed from the body and the Faradrive sheath was exchanged over the wire into the left atrium.  A  Biosense Webster Octarray catheter was advanced into the left atrium through the [employer] sheath.  The Octarray mapping catheter was introduced through the transseptal sheath and positioned over the mouth of all 4 pulmonary veins.  Three-dimensional electroanatomical mapping was performed using CARTO technology.  This demonstrated electrical activity within all four pulmonary veins at baseline.  The [facility] catheter was then placed into the left atrium.  The patient underwent successful sequential electrical isolation of all four pulmonary veins using pulsed field energy with 52 total pulses delivered.  Entrance and  exit block were confirmed. Due to persistence of atrial fibrillation and risk of left atrial flutter, ablation using PFA was performed across the posterior wall. The Farawave catheter was removed from the body and the pentarray catheter was introduced into the left atrium. 3D mapping was performed which confirmed electrical isolation of the pulmonary veins and posterior wall. The sheath was then pulled back into the right atrial and positioned along the cavo-tricuspid isthmus.  A Biosense Webster DF 8mm ablation catheter was placed through the sheath into the right atrium. Mapping along the atrial side of the isthmus was performed.  This demonstrated a standard isthmus.  A series of radiofrequency applications were then delivered along the isthmus.  Complete bidirectional cavotricuspid isthmus block was achieved as confirmed by differential atrial pacing from the low lateral right atrium.  A stimulus to earliest atrial activation across the isthmus measured 137 msec bi-directionally.  The patient was observe without return of conduction through the isthmus. Measurements Following Ablation: In sinus rhythm with RR interval was 1015 msec, with PR 150 msec, QRS 141 msec, and QT 412 msec.  Following ablation the AH interval measured 96 msec with an HV interval of 49 msec. Ventricular pacing was performed, which revealed VA dissociation when pacing at 600 msec. Rapid atrial pacing was performed, which revealed an AV Wenckebach cycle length of <320 msec.  Electroisolation was then again confirmed in all four pulmonary veins.  Pacing was performed along the ablation line which confirmed entrance and exit block. The procedure was therefore considered completed.  All catheters were removed, and the sheaths were aspirated and flushed.  40 mg protamine was given to reverse heparin . The left femoral vein sheaths were closed using the Mynx closure device and the sheaths were removed. The patient was transferred to the recovery  area.  Intracardiac echocardiogram revealed no pericardial effusion.  There were no early apparent complications. CONCLUSIONS: 1. Atrial flutter upon presentation.  2. Successful electrical isolation and anatomical encircling of all four pulmonary veins with pulsed field ablation. 3. Posterior wall isolation using pulse field ablation 4. Cavotricuspid isthmus ablation 5. No early apparent complications. Will Gladis Camnitz,MD 11:43 AM 10/03/2023           LOS: 10 days   Time spent= 35 mins    Burgess JAYSON Dare, MD [facility]  If 7PM-7AM, please contact night-coverage  10/03/2023, 2:09 PM

## 2023-10-03 NOTE — Telephone Encounter (Signed)
 Patient does not want to switch providers anymore. He would like to stay with Dr. Inocencio.

## 2023-10-03 NOTE — TOC Progression Note (Addendum)
 Transition of Care [facility]) - Progression Note    Patient Details  Name: Tommy Rowe MRN: [id number] Date of Birth: [birth year]  Transition of Care [facility]) CM/SW Contact  Justina Delcia Czar, RN Phone Number: [phone] 10/03/2023, 4:50 PM  Clinical Narrative:    Spoke to patient and states wife will provide transportation home. Pt has scale at home for daily weights. Education provided on Living Better with HF. Pt states he will review booklet. Discussed low sodium/heart healthy diet.  Wife will send insurance information, will send to [facility].  [facility] to [facility] on dofetilide  125 mcg has medication in stock and are open tomorrow if pt dc on Friday, July 4.    Will have medication go to [facility] pharmacy on Sat, 7/5 and pt can leave with medications.      Expected Discharge Plan: Home/Self Care Barriers to Discharge: No Barriers Identified  Expected Discharge Plan and Services   Discharge Planning Services: CM Consult   Living arrangements for the past 2 months: Single Family Home                                       Social Determinants of Health (SDOH) Interventions SDOH Screenings   Food Insecurity: No Food Insecurity (09/23/2023)  Housing: Low Risk  (09/23/2023)  Transportation Needs: No Transportation Needs (09/23/2023)  Utilities: Not At Risk (09/23/2023)  Depression (PHQ2-9): Low Risk  (03/12/2022)  Social Connections: Moderately Integrated (09/23/2023)  Tobacco Use: Medium Risk (09/23/2023)    Readmission Risk Interventions     No data to display

## 2023-10-03 NOTE — Transfer of Care (Signed)
 Immediate Anesthesia Transfer of Care Note  Patient: Tommy Rowe  Procedure(s) Performed: ATRIAL FIBRILLATION ABLATION A-FLUTTER ABLATION  Patient Location: Cath Lab  Anesthesia Type:General  Level of Consciousness: awake, alert , and oriented  Airway & Oxygen  Therapy: Patient Spontanous Breathing  Post-op Assessment: Report given to RN and Post -op Vital signs reviewed and stable  Post vital signs: Reviewed and stable  Last Vitals:  Vitals Value Taken Time  BP    Temp    Pulse    Resp    SpO2      Last Pain:  Vitals:   10/03/23 0916  TempSrc:   PainSc: 0-No pain      Patients Stated Pain Goal: 0 (09/29/23 2016)  Complications: There were no known notable events for this encounter.

## 2023-10-03 NOTE — Anesthesia Preprocedure Evaluation (Addendum)
 Anesthesia Evaluation  Patient identified by MRN, date of birth, ID band Patient awake    Reviewed: Allergy & Precautions, NPO status , Patient's Chart, lab work & pertinent test results, reviewed documented beta blocker date and time   Airway Mallampati: III  TM Distance: >3 FB Neck ROM: Full    Dental  (+) Teeth Intact, Dental Advisory Given   Pulmonary former smoker Snores at night, no sleep study    Pulmonary exam normal breath sounds clear to auscultation       Cardiovascular hypertension (100/76 preop), Pt. on medications and Pt. on home beta blockers pulmonary hypertension (mild pHTN on echo)+ Past MI and +CHF (LVEF 20-25%, mod RV dysfunction)  + dysrhythmias (eliquis ) Atrial Fibrillation + Valvular Problems/Murmurs (mod-severe MR, mild MS) MR  Rhythm:Irregular Rate:Normal  Echo 09/24/23: 1. Left ventricular ejection fraction, by estimation, is 20 to 25% . The left ventricle has severely decreased function. The left ventricle demonstrates global hypokinesis. The left ventricular internal cavity size was moderately to severely dilated. Left ventricular diastolic parameters are indeterminate. 2. Right ventricular systolic function is moderately reduced. The right ventricular size is mildly enlarged. There is mildly elevated pulmonary artery systolic pressure. The estimated right ventricular systolic pressure is 42. 2 mmHg. 3. The mitral valve is degenerative. Moderate to severe mitral valve regurgitation. Mild mitral stenosis. Restrictive posterior leafleft, central MR jet. Recommend repeat echo when better compensated. 4. The aortic valve is tricuspid. There is mild calcification of the aortic valve. Aortic valve regurgitation is trivial. Aortic valve sclerosis is present, with no evidence of aortic valve stenosis. 5. The inferior vena cava is dilated in size with < 50% respiratory variability, suggesting right atrial pressure of 15 mmHg.    Neuro/Psych  PSYCHIATRIC DISORDERS Anxiety Depression    negative neurological ROS     GI/Hepatic negative GI ROS, Neg liver ROS,,,  Endo/Other  negative endocrine ROS    Renal/GU negative Renal ROS  negative genitourinary   Musculoskeletal negative musculoskeletal ROS (+)    Abdominal   Peds  Hematology negative hematology ROS (+)   Anesthesia Other Findings   Reproductive/Obstetrics negative OB ROS                              Anesthesia Physical Anesthesia Plan  ASA: 4  Anesthesia Plan: General   Post-op Pain Management: Minimal or no pain anticipated   Induction: Intravenous  PONV Risk Score and Plan: Ondansetron , Dexamethasone  and Treatment may vary due to age or medical condition  Airway Management Planned: Oral ETT  Additional Equipment: ClearSight  Intra-op Plan:   Post-operative Plan: Extubation in OR  Informed Consent: I have reviewed the patients History and Physical, chart, labs and discussed the procedure including the risks, benefits and alternatives for the proposed anesthesia with the patient or authorized representative who has indicated his/her understanding and acceptance.     Dental advisory given  Plan Discussed with: CRNA  Anesthesia Plan Comments:          Anesthesia Quick Evaluation

## 2023-10-03 NOTE — Progress Notes (Addendum)
 Post ablation care/activity instructions are in his AVS EP follow up is in place for Tikosyn  and usual post procedure follow up  Tikosyn  teaching was completed with the patient and his wife bedside, as well as post procedure activity restrictions  This patient is a new TIKOSYN  load during this admission. He will need Rx filled same day of discharge Both [facility] and [facility] community pharmacies are closed Friday 10/04/23 > reopen 10/05/23, usual weekend hours 08:00-16:30 Patients listed pharmacy [facility] (in [place]) does not have dofetilide  [facility] [address] [facility] in [facility] and will be open tomorrow (24 hours), the patient is aware.  CURRENT IN PATIENT med list looks ok from a medication interaction perspective/Tikosyn  He was on lexapro  out patient >>> zoloft here please do not change back to lexapro  without clear plans to f/u EKG closely/EP aware  Charlies Arthur, PA-C

## 2023-10-03 NOTE — Telephone Encounter (Signed)
 Patient Product/process development scientist completed.    The patient is insured through [facility]. Patient has Medicare and is not eligible for a copay card, but may be able to apply for patient assistance or Medicare RX Payment Plan (Patient Must reach out to their plan, if eligible for payment plan), if available.    Ran test claim for dofetilide (Tikosyn) 125 mcg and the current 30 day co-pay is $40.03.   This test claim was processed through [facility]- copay amounts may vary at other pharmacies due to pharmacy/plan contracts, or as the patient moves through the different stages of their insurance plan.     Morgan Arab, CPHT Pharmacy Technician III Certified Patient Advocate [facility] Pharmacy Patient Advocate Team Direct Number: [phone]  Fax: [phone]

## 2023-10-03 NOTE — Anesthesia Procedure Notes (Signed)
 Arterial Line Insertion Start/End7/06/2023 10:10 AM, 10/03/2023 10:16 AM Performed by: Merla Almarie HERO, DO, anesthesiologist  Patient location: Pre-op. Preanesthetic checklist: patient identified, IV checked, site marked, risks and benefits discussed, surgical consent, monitors and equipment checked, pre-op evaluation, timeout performed and anesthesia consent Lidocaine  1% used for infiltration Left, radial was placed Catheter size: 20 G Hand hygiene performed  and maximum sterile barriers used   Attempts: 1 Procedure performed without using ultrasound guided technique. Following insertion, dressing applied. Post procedure assessment: normal and unchanged  Patient tolerated the procedure well with no immediate complications.

## 2023-10-03 NOTE — Anesthesia Postprocedure Evaluation (Signed)
 Anesthesia Post Note  Patient: Tommy Rowe  Procedure(s) Performed: ATRIAL FIBRILLATION ABLATION A-FLUTTER ABLATION     Patient location during evaluation: PACU Anesthesia Type: General Level of consciousness: awake and alert Pain management: pain level controlled Vital Signs Assessment: post-procedure vital signs reviewed and stable Respiratory status: spontaneous breathing, nonlabored ventilation and respiratory function stable Cardiovascular status: blood pressure returned to baseline and stable Postop Assessment: no apparent nausea or vomiting Anesthetic complications: no   There were no known notable events for this encounter.  Last Vitals:  Vitals:   10/03/23 0935 10/03/23 1159  BP: 108/70 97/61  Pulse: (!) 114 65  Resp: 16 14  Temp:  37 C  SpO2: 97% 96%    Last Pain:  Vitals:   10/03/23 1159  TempSrc: Oral  PainSc: 0-No pain   Pain Goal: Patients Stated Pain Goal: 0 (09/29/23 2016)                 Butler Levander Pinal

## 2023-10-03 NOTE — Progress Notes (Signed)
  Progress Note  Patient Name: Tommy Rowe Date of Encounter: 10/03/2023 [facility] Cardiologist: None   Interval Summary   Remains tachycardic.  No other acute complaints.  Vital Signs Vitals:   10/02/23 1608 10/02/23 1935 10/02/23 2359 10/03/23 0504  BP: 112/72 95/63 107/69 109/79  Pulse: (!) 131 60 61 (!) 118  Resp: 18 19 16 18   Temp: 97.8 F (36.6 C) 97.8 F (36.6 C) 97.9 F (36.6 C) (!) 97.4 F (36.3 C)  TempSrc: Oral Oral Oral Oral  SpO2: 98% 96% 97% 98%  Weight:    84 kg  Height:        Intake/Output Summary (Last 24 hours) at 10/03/2023 0732 Last data filed at 10/03/2023 0500 Gross per 24 hour  Intake 960 ml  Output --  Net 960 ml      10/03/2023    5:04 AM 10/02/2023    4:00 AM 10/01/2023    4:05 AM  Last 3 Weights  Weight (lbs) 185 lb 3.2 oz 203 lb 11.3 oz 197 lb 12 oz  Weight (kg) 84.006 kg 92.4 kg 89.7 kg      Telemetry/ECG  Atrial flutter- Personally Reviewed  Physical Exam  GEN: No acute distress.   Neck: No JVD Cardiac: Tachycardic, irregular, no murmurs, rubs, or gallops.  Respiratory: Clear to auscultation bilaterally. GI: Soft, nontender, non-distended  MS: No edema  Assessment & Plan  1.  Persistent atrial fibrillation/flutter: On Eliquis  and dofetilide .  He is continuing to have episodes of his arrhythmia.  Plan for ablation today.  Risks and benefits have been discussed.  He understands the risks and is agreed to the procedure.  BROOKS KINNAN has presented today for surgery, with the diagnosis of AF.  The various methods of treatment have been discussed with the patient and family. After consideration of risks, benefits and other options for treatment, the patient has consented to  Procedure(s): Catheter ablation as a surgical intervention .  Risks include but not limited to complete heart block, stroke, esophageal damage, nerve damage, bleeding, vascular damage, tamponade, perforation, MI, and death. The patient's history has been  reviewed, patient examined, no change in status, stable for surgery.  I have reviewed the patient's chart and labs.  Questions were answered to the patient's satisfaction.    2.  Nonischemic cardiomyopathy: Likely tachycardia mediated.  Well compensated.  Medical management per heart failure cardiology.  Soyla Norton, MD 10/03/2023 7:33 AM   For questions or updates, please contact [facility] Please consult [email or web address] for contact info under       Signed, Avantika Shere Gladis Norton, MD

## 2023-10-03 NOTE — Progress Notes (Signed)
 Pharmacy: Dofetilide  (Tikosyn ) - Follow Up Assessment and Electrolyte Replacement  Pharmacy consulted to assist in monitoring and replacing electrolytes in this 68 y.o. male admitted on 09/23/2023 undergoing dofetilide  initiation. First dofetilide  dose: 6/25. Plan ablation 7/3  Labs:    Component Value Date/Time   K 4.5 10/03/2023 0414   MG 2.0 10/03/2023 0414     Plan: Potassium: K >/= 4: No additional supplementation needed  Magnesium : Mag 2 no additional supplementation needed     Olam Chalk Pharm.D. CPP, BCPS Clinical Pharmacist [phone] 10/03/2023 7:38 AM   Please check AMION for all [facility] Pharmacy numbers 10/03/2023

## 2023-10-03 NOTE — Anesthesia Procedure Notes (Signed)
 Procedure Name: Intubation Date/Time: 10/03/2023 10:27 AM  Performed by: Christopher Comings, CRNAPre-anesthesia Checklist: Patient identified, Emergency Drugs available, Suction available and Patient being monitored Patient Re-evaluated:Patient Re-evaluated prior to induction Oxygen  Delivery Method: Circle system utilized Preoxygenation: Pre-oxygenation with 100% oxygen  Induction Type: IV induction Ventilation: Mask ventilation without difficulty Laryngoscope Size: Mac and 4 Grade View: Grade II Tube type: Oral Tube size: 7.5 mm Number of attempts: 1 Airway Equipment and Method: Stylet and Oral airway Placement Confirmation: ETT inserted through vocal cords under direct vision, positive ETCO2 and breath sounds checked- equal and bilateral Secured at: 22 cm Tube secured with: Tape Dental Injury: Teeth and Oropharynx as per pre-operative assessment

## 2023-10-03 NOTE — Discharge Instructions (Signed)

## 2023-10-03 NOTE — Progress Notes (Signed)
 RN assessed femoral sites. R femoral site had new drainage. RN held pressure for . VSS and pulses +2. Dr. Inocencio aware and to be on bedrest for 1 more hour.

## 2023-10-03 NOTE — Telephone Encounter (Signed)
 Patient was an add-on a-fib ablation for Dr. Inocencio today 7/3. Secure chat received from Charlies Arthur, [place] stating patient would like to see Dr. Kennyth instead, after seeing him in the hospital.

## 2023-10-04 DIAGNOSIS — I4891 Unspecified atrial fibrillation: Secondary | ICD-10-CM | POA: Diagnosis not present

## 2023-10-04 LAB — BASIC METABOLIC PANEL WITH GFR
Anion gap: 7 (ref 5–15)
BUN: 29 mg/dL — ABNORMAL HIGH (ref 8–23)
CO2: 24 mmol/L (ref 22–32)
Calcium: 8.4 mg/dL — ABNORMAL LOW (ref 8.9–10.3)
Chloride: 108 mmol/L (ref 98–111)
Creatinine, Ser: 0.76 mg/dL (ref 0.61–1.24)
GFR, Estimated: >60 mL/min (ref >60)
Glucose, Bld: 131 mg/dL — ABNORMAL HIGH (ref 70–99)
Potassium: 4.3 mmol/L (ref 3.5–5.1)
Sodium: 139 mmol/L (ref 135–145)

## 2023-10-04 LAB — T4, FREE: Free T4: 1.37 ng/dL — ABNORMAL HIGH (ref 0.61–1.12)

## 2023-10-04 LAB — PHOSPHORUS: Phosphorus: 3.4 mg/dL (ref 2.5–4.6)

## 2023-10-04 LAB — MAGNESIUM: Magnesium: 2.1 mg/dL (ref 1.7–2.4)

## 2023-10-04 MED ORDER — METOPROLOL SUCCINATE ER 50 MG PO TB24: 50.0000 mg | ORAL_TABLET | Freq: Every day | ORAL | 0 refills | Status: DC

## 2023-10-04 MED ORDER — EMPAGLIFLOZIN 10 MG PO TABS
10.0000 mg | ORAL_TABLET | Freq: Every day | ORAL | Status: DC
  Administered 2023-10-04: 10 mg via ORAL
  Filled 2023-10-04: qty 1

## 2023-10-04 MED ORDER — DOFETILIDE 125 MCG PO CAPS: 125.0000 ug | ORAL_CAPSULE | Freq: Two times a day (BID) | ORAL | 0 refills | Status: DC

## 2023-10-04 MED ORDER — SERTRALINE HCL 50 MG PO TABS: 50.0000 mg | ORAL_TABLET | Freq: Every day | ORAL | 0 refills | Status: AC

## 2023-10-04 MED ORDER — EMPAGLIFLOZIN 10 MG PO TABS: 10.0000 mg | ORAL_TABLET | Freq: Every day | ORAL | 0 refills | Status: AC

## 2023-10-04 NOTE — Plan of Care (Signed)
  Problem: Education: Goal: Knowledge of General Education information will improve Description: Including pain rating scale, medication(s)/side effects and non-pharmacologic comfort measures Outcome: Adequate for Discharge   Problem: Health Behavior/Discharge Planning: Goal: Ability to manage health-related needs will improve Outcome: Adequate for Discharge   Problem: Clinical Measurements: Goal: Ability to maintain clinical measurements within normal limits will improve Outcome: Adequate for Discharge Goal: Will remain free from infection Outcome: Adequate for Discharge Goal: Diagnostic test results will improve Outcome: Adequate for Discharge Goal: Respiratory complications will improve Outcome: Adequate for Discharge Goal: Cardiovascular complication will be avoided Outcome: Adequate for Discharge   Problem: Activity: Goal: Risk for activity intolerance will decrease Outcome: Adequate for Discharge   Problem: Nutrition: Goal: Adequate nutrition will be maintained Outcome: Adequate for Discharge   Problem: Coping: Goal: Level of anxiety will decrease Outcome: Adequate for Discharge   Problem: Elimination: Goal: Will not experience complications related to bowel motility Outcome: Adequate for Discharge Goal: Will not experience complications related to urinary retention Outcome: Adequate for Discharge   Problem: Pain Managment: Goal: General experience of comfort will improve and/or be controlled Outcome: Adequate for Discharge   Problem: Safety: Goal: Ability to remain free from injury will improve Outcome: Adequate for Discharge   Problem: Skin Integrity: Goal: Risk for impaired skin integrity will decrease Outcome: Adequate for Discharge   Problem: Education: Goal: Ability to describe self-care measures that may prevent or decrease complications (Diabetes Survival Skills Education) will improve Outcome: Adequate for Discharge Goal: Individualized Educational  Video(s) Outcome: Adequate for Discharge   Problem: Coping: Goal: Ability to adjust to condition or change in health will improve Outcome: Adequate for Discharge   Problem: Fluid Volume: Goal: Ability to maintain a balanced intake and output will improve Outcome: Adequate for Discharge   Problem: Health Behavior/Discharge Planning: Goal: Ability to identify and utilize available resources and services will improve Outcome: Adequate for Discharge Goal: Ability to manage health-related needs will improve Outcome: Adequate for Discharge   Problem: Metabolic: Goal: Ability to maintain appropriate glucose levels will improve Outcome: Adequate for Discharge   Problem: Nutritional: Goal: Maintenance of adequate nutrition will improve Outcome: Adequate for Discharge Goal: Progress toward achieving an optimal weight will improve Outcome: Adequate for Discharge   Problem: Skin Integrity: Goal: Risk for impaired skin integrity will decrease Outcome: Adequate for Discharge   Problem: Tissue Perfusion: Goal: Adequacy of tissue perfusion will improve Outcome: Adequate for Discharge   Problem: Education: Goal: Knowledge of disease or condition will improve Outcome: Adequate for Discharge Goal: Understanding of medication regimen will improve Outcome: Adequate for Discharge Goal: Individualized Educational Video(s) Outcome: Adequate for Discharge   Problem: Activity: Goal: Ability to tolerate increased activity will improve Outcome: Adequate for Discharge   Problem: Cardiac: Goal: Ability to achieve and maintain adequate cardiopulmonary perfusion will improve Outcome: Adequate for Discharge   Problem: Health Behavior/Discharge Planning: Goal: Ability to safely manage health-related needs after discharge will improve Outcome: Adequate for Discharge   Problem: Education: Goal: Understanding of disease, treatment, and recovery process will improve Outcome: Adequate for  Discharge   Problem: Activity: Goal: Ability to return to baseline activity level will improve Outcome: Adequate for Discharge   Problem: Cardiac: Goal: Ability to maintain adequate cardiovascular perfusion will improve Outcome: Adequate for Discharge Goal: Vascular access site(s) Level 0-1 will be maintained Outcome: Adequate for Discharge   Problem: Health Behavior/ Discharge Planning: Goal: Ability to safely manage health related needs after discharge Outcome: Adequate for Discharge

## 2023-10-04 NOTE — Progress Notes (Addendum)
  Progress Note  Patient Name: Tommy Rowe Date of Encounter: 10/04/2023 [facility] Cardiologist: None   Interval Summary   Post atrial fibrillation/flutter ablation yesterday.  He remains in sinus rhythm.  Feeling much improved with significantly less fatigue, weakness, shortness of breath.  Vital Signs Vitals:   10/03/23 2354 10/04/23 0413 10/04/23 0500 10/04/23 0743  BP: (!) 102/57 106/64  103/63  Pulse: (!) 57 (!) 48  (!) 49  Resp: 15   18  Temp: 98.9 F (37.2 C) 97.8 F (36.6 C)  98.6 F (37 C)  TempSrc: Oral Oral  Oral  SpO2: 96% 100%  98%  Weight:   84.9 kg   Height:        Intake/Output Summary (Last 24 hours) at 10/04/2023 0812 Last data filed at 10/04/2023 0647 Gross per 24 hour  Intake 1280 ml  Output 1010 ml  Net 270 ml      10/04/2023    5:00 AM 10/03/2023    5:04 AM 10/02/2023    4:00 AM  Last 3 Weights  Weight (lbs) 187 lb 1.6 oz 185 lb 3.2 oz 203 lb 11.3 oz  Weight (kg) 84.868 kg 84.006 kg 92.4 kg      Telemetry/ECG  Sinus rhythm, left bundle branch block- Personally Reviewed  Physical Exam  GEN: No acute distress.   Neck: No JVD Cardiac: RRR, no murmurs, rubs, or gallops.  Respiratory: Clear to auscultation bilaterally. GI: Soft, nontender, non-distended  MS: No edema  Assessment & Plan   1.  Persistent atrial fibrillation/flutter: On Eliquis  and dofetilide .  He is post ablation yesterday.  He remains in sinus rhythm without further episodes of his arrhythmia.  At this point, would be okay with discharge home.  Tommy Rowe discharge on dofetilide  and Eliquis .  Please review EP app note from yesterday for instructions on dofetilide  at discharge.  2.  Nonischemic cardiomyopathy: Likely tachycardia mediated.  Ejection fraction Tommy Rowe likely improve.  Tommy Rowe discharge home on current heart failure medications.  Tommy Rowe [facility] Tommy Rowe sign off.   The patient is ready for discharge today from a cardiac standpoint. Medication Recommendations:  Dofetilide  125 mcg twice daily, Eliquis  5 mg twice daily, Toprol -XL 50 mg daily, farxiga 10 mg Other recommendations (labs, testing, etc): None Follow up as an outpatient: Has been arranged For questions or updates, please contact [facility] Please consult [email or web address] for contact info under       Signed, Jamorris Ndiaye Gladis Norton, MD

## 2023-10-04 NOTE — Progress Notes (Signed)
 Explained discharge instructions to patient. Reviewed follow up appointment and next medication administration times. Also reviewed education. Patient verbalized having an understanding for instructions given. All belongings are in the patient's possession. Both IVs and telemetry were removed by the floor staff. No other needs verbalized. Transported downstairs for discharge.

## 2023-10-04 NOTE — Progress Notes (Signed)
 PT's HR 55 and BP 105/62. MD advised ok to give 25 mg Metoprolol 

## 2023-10-04 NOTE — Care Management Important Message (Signed)
 Important Message  Patient Details  Name: Tommy Rowe MRN: [id number] Date of Birth: September 27, 1955   Important Message Given:  Yes - Medicare IM     Vonzell Arrie Sharps 10/04/2023, 11:00 AM

## 2023-10-04 NOTE — Discharge Summary (Signed)
 Physician Discharge Summary  Tommy Rowe FMW:[id number] DOB: Apr 28, 1955 DOA: 09/23/2023  PCP: Darra Hamilton, PA-C  Admit date: 09/23/2023 Discharge date: 10/04/2023  Admitted From: Home Home Disposition: Home  Recommendations for Outpatient Follow-up:  Follow up with PCP in 1-2 weeks Please obtain BMP/CBC in one week your next doctors visit.  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[employer], independent, medical history significant for PAF with recurrent episodes of RVR, failed multiple cardioversions-most recent in June 2025, on Eliquis , scheduled for ablation at [facility] 10/30/2023, newly diagnosed hyperthyroidism, chronic combined systolic and diastolic CHF, CAD s/p DES to LAD June 2023, HTN, HLD, LBBB, multiple recent hospitalizations (for this year),   6/4 - 6/5 A-fib with RVR, failed DCCV attempt with 100 J x 2 and 120 J x 1, diagnosed with new hyperthyroidism, reverted to sinus rhythm after Amiodarone  drip and discharged on metoprolol , Eliquis  and methimazole    6/8 - 6/9: Back with A-fib with RVR, reloaded with amiodarone , converted to sinus rhythm discharged back on metoprolol ,   6/18 - 6/19: Again with A-fib with RVR, briefly on amiodarone  drip and discharged on amiodarone , metoprolol ;,   Developed RVR on 6/22 and took 3 doses of home metoprolol  25-50, heart rate persisted >140 and presented to the ED where he was borderline hypotensive, dyspneic and in chest pain, anxiety, tremors, tenderness in his neck/thyroid .  He was admitted to ICU for thyroid  storm/hyperthyroidism driven A-fib/RVR contributed by amiodarone  as well.  Converted to sinus rhythm after Tikosyn . , Also treating for hyperthyroidism.  Stabilized and care transferred to [facility] on 6/27.  EP Cardiology and AHF team following.   Continues to be in and out of A-fib with RVR, now mostly in RVR, planned A-fib/flutter ablation on Thursday 7/3.   Post ablation patient did well, now remains in normal sinus rhythm.  Cleared by EP cardiology for discharge.   Assessment & Plan:  Principal Problem:   Atrial fibrillation with RVR (HCC) Active Problems:   Atrial flutter with rapid ventricular response (HCC)    Persistent A-fib with RVR, intractable Failed multiple cardioversion and medical treatment.  EP cardiology is following started on Tikosyn  6/25 along with metoprolol .  Status post ablation 7/3, now in normal sinus rhythm.  Will discharge patient on Tikosyn  125 mcg twice daily, Eliquis  and Toprol -XL.   Hypethyroidism/thyroid  storm TSH undetectable T4: 2.95 > 2.5 > 2.27 > 2 >1.79 >1.54> 1.67>1.47 Normal thyroid  ultrasound. T4 appears to have leveled off. Completed IV hydrocortisone  and Lugol's iodine . PTU was switched to methimazole .  Currently on methimazole  5 Mg 3 times daily.  Has endocrinology appointment for 10/03/2023 with Dr. [name].  This appointment will need to be rescheduled since patient will still be hospitalized at that time.   Acute on chronic HFrEF/NICM Echo with LVEF 20-25% with smoke.  Likely tachycardia mediated Remains clinically euvolemic Management per cardiology team: On Tikosyn , Eliquis , Toprol -XL and Farxiga   Essential hypertension Medications as mentioned above   Hyperlipidemia Continue rosuvastatin .   CAD s/p stent Stable.   Chronic thrombocytopenia Resolved.   Body mass index is 26.82 kg/m.   DVT prophylaxis: SCDs Start: 09/23/23 0432 apixaban  (ELIQUIS ) tablet 5 mg      Code Status: Full Code Family Communication:   Discharge today  Subjective: Seen post ablation, heart rate in 70s.  No complaints, sitting up in the recliner  Examination:  General exam: Appears calm and comfortable  Respiratory system: Clear to auscultation. Respiratory effort  normal. Cardiovascular system: Atrial fibrillation/irregularly irregular Gastrointestinal system: Abdomen is nondistended, soft and nontender. No organomegaly or masses felt. Normal bowel sounds heard. Central nervous system: Alert and oriented. No focal neurological deficits. Extremities: Symmetric 5 x 5 power. Skin: No rashes, lesions or ulcers Psychiatry: Judgement and insight appear normal. Mood & affect appropriate.    Discharge Diagnoses:  Principal Problem:   Atrial fibrillation with RVR (HCC) Active Problems:   Atrial flutter with rapid ventricular response (HCC)      Discharge Exam: Vitals:   10/04/23 0743 10/04/23 0925  BP: 103/63   Pulse: (!) 49 (!) 59  Resp: 18   Temp: 98.6 F (37 C)   SpO2: 98%    Vitals:   10/04/23 0413 10/04/23 0500 10/04/23 0743 10/04/23 0925  BP: 106/64  103/63   Pulse: (!) 48  (!) 49 (!) 59  Resp:   18   Temp: 97.8 F (36.6 C)  98.6 F (37 C)   TempSrc: Oral  Oral   SpO2: 100%  98%   Weight:  84.9 kg    Height:          Discharge Instructions   Allergies as of 10/04/2023       Reactions   Amiodarone  Other (See Comments)   History of Graves Disease        Medication List     STOP taking these medications    escitalopram  20 MG tablet Commonly known as: LEXAPRO    metoprolol  tartrate 25 MG tablet Commonly known as: LOPRESSOR    Multaq  400 MG tablet Generic drug: dronedarone    spironolactone  25 MG tablet Commonly known as: ALDACTONE        TAKE these medications    desonide 0.05 % ointment Commonly known as: DESOWEN Apply 1 Application topically daily as needed.   dofetilide  125 MCG capsule Commonly known as: TIKOSYN  Take 1 capsule (125 mcg total) by mouth 2 (two) times daily.   doxylamine  (Sleep) 25 MG tablet Commonly known as: UNISOM  Take 12.5 mg by mouth at bedtime as needed for sleep.   Eliquis  5 MG Tabs tablet Generic drug: apixaban  Take 5 mg by mouth 2 (two) times daily.   empagliflozin  10  MG Tabs tablet Commonly known as: JARDIANCE  Take 1 tablet (10 mg total) by mouth daily.   methimazole  5 MG tablet Commonly known as: TAPAZOLE  Take 1 tablet (5 mg total) by mouth 3 (three) times daily.   metoprolol  succinate 50 MG 24 hr tablet Commonly known as: Toprol  XL Take 1 tablet (50 mg total) by mouth daily. Take with or immediately following a meal.   multivitamin with minerals Tabs tablet Take 1 tablet by mouth every morning. Centrum Silver for Men 50+   rosuvastatin  20 MG tablet Commonly known as: CRESTOR  Take 20 mg by mouth at bedtime.   sertraline  50 MG tablet Commonly known as: ZOLOFT  Take 1 tablet (50 mg total) by mouth at bedtime.   tamsulosin  0.4 MG Caps capsule Commonly known as: FLOMAX  Take 1 capsule (0.4 mg total) by mouth daily. What changed: when to take this        Follow-up Information     Dartha Ernst, MD Follow up on 10/03/2023.   Specialty: Endocrinology Why: at 10:20 Contact information: [address] [address] [place] [address] [phone]         [facility] Follow up  on 10/10/2023.   Specialty: Cardiology Why: Follow up in the Advanced Heart Failure Clinic 7/10 at 2 pm Entrance C, free valet Contact information: [address] [place]  [address] [phone]        Darra Hamilton, PA-C Follow up.   Specialty: Physician Assistant Why: hospital follow up appt scheduled  for 10/15/2023 at 11:15 am, please call if you are unable to keep appt to reschedule Contact information: [address] [place] [address] [phone]                Allergies  Allergen Reactions   Amiodarone  Other (See Comments)    History of Graves Disease    You were cared for by a hospitalist during your hospital stay. If you have any questions about your discharge medications or the care you received while you were in the hospital after you are discharged, you can call  the unit and asked to speak with the hospitalist on call if the hospitalist that took care of you is not available. Once you are discharged, your primary care physician will handle any further medical issues. Please note that no refills for any discharge medications will be authorized once you are discharged, as it is imperative that you return to your primary care physician (or establish a relationship with a primary care physician if you do not have one) for your aftercare needs so that they can reassess your need for medications and monitor your lab values.  You were cared for by a hospitalist during your hospital stay. If you have any questions about your discharge medications or the care you received while you were in the hospital after you are discharged, you can call the unit and asked to speak with the hospitalist on call if the hospitalist that took care of you is not available. Once you are discharged, your primary care physician will handle any further medical issues. Please note that NO REFILLS for any discharge medications will be authorized once you are discharged, as it is imperative that you return to your primary care physician (or establish a relationship with a primary care physician if you do not have one) for your aftercare needs so that they can reassess your need for medications and monitor your lab values.  Please request your Prim.MD to go over all Hospital Tests and Procedure/Radiological results at the follow up, please get all Hospital records sent to your Prim MD by signing hospital release before you go home.  Get CBC, CMP, 2 view Chest X ray checked  by Primary MD during your next visit or SNF MD in 5-7 days ( we routinely change or add medications that can affect your baseline labs and fluid status, therefore we recommend that you get the mentioned basic workup next visit with your PCP, your PCP may decide not to get them or add new tests based on their clinical decision)  On  your next visit with your primary care physician please Get Medicines reviewed and adjusted.  If you experience worsening of your admission symptoms, develop shortness of breath, life threatening emergency, suicidal or homicidal thoughts you must seek medical attention immediately by calling 911 or calling your MD immediately  if symptoms less severe.  You Must read complete instructions/literature along with all the possible adverse reactions/side effects for all the Medicines you take and that have been prescribed to you. Take any new Medicines after you have completely understood and accpet all the possible adverse reactions/side effects.  Do not drive, operate heavy machinery, perform activities at heights, swimming or participation in water activities or provide baby sitting services if your were admitted for syncope or siezures until you have seen by Primary MD or a Neurologist and advised to do so again.  Do not drive when taking Pain medications.   Procedures/Studies: EP STUDY Result Date: 10/03/2023 SURGEON:  Soyla Norton, MD PREPROCEDURE DIAGNOSES: 1. Persistent atrial fibrillation. 2. Typical atrial flutter POSTPROCEDURE DIAGNOSES: 1. Persistent atrial fibrillation. 2. Typical atrial flutter PROCEDURES: 1. Comprehensive electrophysiologic study. 2. Coronary sinus pacing and recording. 3. Three-dimensional mapping of atrial fibrillation with additional mapping and ablation of a second discrete focus 4. Ablation of atrial fibrillation with additional mapping and ablation of a second discrete focus 5. Intracardiac echocardiography. 6. Transseptal puncture of an intact septum. INTRODUCTION:  Tommy Rowe is a 68 y.o. male with a history of paroxysmal atrial fibrillation who now presents for EP study and radiofrequency ablation.  The patient reports initially being diagnosed with atrial fibrillation after presenting with symptomatic palpitations and fatgiue. The patient reports increasing  frequency and duration of atrial fibrillation since that time.  The patient has failed medical therapy.  The patient therefore presents today for catheter ablation of atrial fibrillation. DESCRIPTION OF PROCEDURE:  Informed written consent was obtained, and the patient was brought to the electrophysiology lab in a fasting state.  The patient was adequately sedated with intravenous medications as outlined in the anesthesia report.  The patient's left and right groins were prepped and draped in the usual sterile fashion by the EP lab staff.  Using a percutaneous Seldinger technique, 1 8-French hemostasis sheaths were placed in the right femoral vein, and one 7 [place] and one 9-French hemostasis sheaths were placed into the left common femoral vein. The right femoral vein sheath site was preclosed using an Abbott Perclose and closed at the end of the case. A Biosense Webster Octarray catheter was advanced into the atrium to make a right atrial map. Direct ultrasound guidance is used for right and left femoral veins with normal vessel patency. Ultrasound images are captured and stored in the patient's chart. Using ultrasound guidance, the Brockenbrough needle and wire were visualized entering the vessel. Catheter Placement:  A 7-French Biosense Webster Decapolar coronary sinus catheter was introduced through the right common femoral vein and advanced into the coronary sinus for recording and pacing from this location.  Initial Measurements: The patient presented to the electrophysiology lab in atrial flutter. his  QRS measured 140 msec and a QT interval of 303 msec. Intracardiac Echocardiography: An 8-French Biosense Webster AcuNav intracardiac echocardiography catheter was introduced through the right common femoral vein and advanced into the right atrium. Intracardiac echocardiography was performed of the left atrium, and a three-dimensional anatomical rendering of the left atrium was performed using CARTO sound  technology.  The patient was noted to have a moderate sized left atrium.  The interatrial septum was prominent but not aneurysmal. All 4 pulmonary veins were visualized and noted to have separate ostia.  The pulmonary veins were moderate in size.  The left atrial appendage was visualized and did not reveal thrombus.   There was no evidence of pulmonary vein stenosis. Transseptal Puncture: The right common femoral vein sheath was exchanged for one [employer] SL 1 sheath and transseptal access was achieved in a standard fashion using a Bayless pigtail wire under fluoroscopy with intracardiac echocardiography confirmation of the transseptal puncture.  Once transseptal access had been achieved, heparin  was administered  intravenously and intra- arterially in order to maintain an ACT of greater than 350 seconds throughout the procedure. 3D Mapping and Ablation:  The Bayless sheath was removed from the body and the Faradrive sheath was exchanged over the wire into the left atrium.  A  Biosense Webster Octarray catheter was advanced into the left atrium through the [employer] sheath.  The Octarray mapping catheter was introduced through the transseptal sheath and positioned over the mouth of all 4 pulmonary veins.  Three-dimensional electroanatomical mapping was performed using CARTO technology.  This demonstrated electrical activity within all four pulmonary veins at baseline.  The [facility] catheter was then placed into the left atrium.  The patient underwent successful sequential electrical isolation of all four pulmonary veins using pulsed field energy with 52 total pulses delivered.  Entrance and exit block were confirmed. Due to persistence of atrial fibrillation and risk of left atrial flutter, ablation using PFA was performed across the posterior wall. The Farawave catheter was removed from the body and the pentarray catheter was introduced into the left atrium. 3D mapping was performed which confirmed  electrical isolation of the pulmonary veins and posterior wall. The sheath was then pulled back into the right atrial and positioned along the cavo-tricuspid isthmus.  A Biosense Webster DF 8mm ablation catheter was placed through the sheath into the right atrium. Mapping along the atrial side of the isthmus was performed.  This demonstrated a standard isthmus.  A series of radiofrequency applications were then delivered along the isthmus.  Complete bidirectional cavotricuspid isthmus block was achieved as confirmed by differential atrial pacing from the low lateral right atrium.  A stimulus to earliest atrial activation across the isthmus measured 137 msec bi-directionally.  The patient was observe without return of conduction through the isthmus. Measurements Following Ablation: In sinus rhythm with RR interval was 1015 msec, with PR 150 msec, QRS 141 msec, and QT 412 msec.  Following ablation the AH interval measured 96 msec with an HV interval of 49 msec. Ventricular pacing was performed, which revealed VA dissociation when pacing at 600 msec. Rapid atrial pacing was performed, which revealed an AV Wenckebach cycle length of <320 msec.  Electroisolation was then again confirmed in all four pulmonary veins.  Pacing was performed along the ablation line which confirmed entrance and exit block. The procedure was therefore considered completed.  All catheters were removed, and the sheaths were aspirated and flushed.  40 mg protamine  was given to reverse heparin . The left femoral vein sheaths were closed using the Mynx closure device and the sheaths were removed. The patient was transferred to the recovery area.  Intracardiac echocardiogram revealed no pericardial effusion.  There were no early apparent complications. CONCLUSIONS: 1. Atrial flutter upon presentation.  2. Successful electrical isolation and anatomical encircling of all four pulmonary veins with pulsed field ablation. 3. Posterior wall isolation using  pulse field ablation 4. Cavotricuspid isthmus ablation 5. No early apparent complications. Soyla Lunger Camnitz,MD 11:43 AM 10/03/2023   ECHOCARDIOGRAM COMPLETE Result Date: 09/23/2023    ECHOCARDIOGRAM REPORT   Patient Name:   LAVONE WEISEL Date of Exam: 09/23/2023 Medical Rec #:  [id number]      Height:       72.0 in Accession #:    [id number]     Weight:       189.6 lb Date of Birth:  1955-10-26     BSA:          2.083 m Patient Age:    57  years       BP:           104/84 mmHg Patient Gender: M              HR:           139 bpm. Exam Location:  Inpatient Procedure: 2D Echo, Cardiac Doppler, Color Doppler and Intracardiac            Opacification Agent (Both Spectral and Color Flow Doppler were            utilized during procedure). Indications:    I50.40* Unspecified combined systolic (congestive) and diastolic                 (congestive) heart failure  History:        Patient has prior history of Echocardiogram examinations, most                 recent 09/19/2021. Previous Myocardial Infarction and CAD,                 Abnormal ECG, Arrythmias:Atrial Fibrillation, Atrial Flutter and                 LBBB; Signs/Symptoms:Shortness of Breath, Dyspnea and                 Dizziness/Lightheadedness.  Sonographer:    Ellouise Mose RDCS Referring Phys: [phone] AMY D CLEGG IMPRESSIONS  1. Left ventricular ejection fraction, by estimation, is 20 to 25%. The left ventricle has severely decreased function. The left ventricle demonstrates global hypokinesis. The left ventricular internal cavity size was moderately to severely dilated. Left ventricular diastolic parameters are indeterminate.  2. Right ventricular systolic function is moderately reduced. The right ventricular size is mildly enlarged. There is mildly elevated pulmonary artery systolic pressure. The estimated right ventricular systolic pressure is 42.2 mmHg.  3. The mitral valve is degenerative. Moderate to severe mitral valve regurgitation. Mild mitral stenosis.  Restrictive posterior leafleft, central MR jet. Recommend repeat echo when better compensated.  4. The aortic valve is tricuspid. There is mild calcification of the aortic valve. Aortic valve regurgitation is trivial. Aortic valve sclerosis is present, with no evidence of aortic valve stenosis.  5. The inferior vena cava is dilated in size with <50% respiratory variability, suggesting right atrial pressure of 15 mmHg. Conclusion(s)/Recommendation(s): Cannot exclude the possibility of apical thrombus. Large amount of contrast hang up in the apex. FINDINGS  Left Ventricle: Left ventricular ejection fraction, by estimation, is 20 to 25%. The left ventricle has severely decreased function. The left ventricle demonstrates global hypokinesis. Definity  contrast agent was given IV to delineate the left ventricular endocardial borders. The left ventricular internal cavity size was moderately to severely dilated. There is no left ventricular hypertrophy. Left ventricular diastolic parameters are indeterminate. Right Ventricle: The right ventricular size is mildly enlarged. No increase in right ventricular wall thickness. Right ventricular systolic function is moderately reduced. There is mildly elevated pulmonary artery systolic pressure. The tricuspid regurgitant velocity is 2.61 m/s, and with an assumed right atrial pressure of 15 mmHg, the estimated right ventricular systolic pressure is 42.2 mmHg. Left Atrium: Left atrial size was normal in size. Right Atrium: Right atrial size was normal in size. Pericardium: There is no evidence of pericardial effusion. Mitral Valve: The mitral valve is degenerative in appearance. Moderate to severe mitral valve regurgitation. Mild mitral valve stenosis. MV peak gradient, 9.5 mmHg. The mean mitral valve gradient is 3.0 mmHg. Tricuspid Valve: The tricuspid valve  is normal in structure. Tricuspid valve regurgitation is mild . No evidence of tricuspid stenosis. Aortic Valve: The aortic  valve is tricuspid. There is mild calcification of the aortic valve. Aortic valve regurgitation is trivial. Aortic valve sclerosis is present, with no evidence of aortic valve stenosis. Pulmonic Valve: The pulmonic valve was normal in structure. Pulmonic valve regurgitation is not visualized. No evidence of pulmonic stenosis. Aorta: The aortic root and ascending aorta are structurally normal, with no evidence of dilitation. Venous: The inferior vena cava is dilated in size with less than 50% respiratory variability, suggesting right atrial pressure of 15 mmHg. IAS/Shunts: No atrial level shunt detected by color flow Doppler.  LEFT VENTRICLE PLAX 2D LVIDd:         5.30 cm      Diastology LVIDs:         4.80 cm      LV e' medial:    4.68 cm/s LV PW:         1.10 cm      LV E/e' medial:  25.9 LV IVS:        1.20 cm      LV e' lateral:   14.90 cm/s LVOT diam:     2.20 cm      LV E/e' lateral: 8.1 LV SV:         45 LV SV Index:   22 LVOT Area:     3.80 cm  LV Volumes (MOD) LV vol d, MOD A2C: 185.0 ml LV vol d, MOD A4C: 156.0 ml LV vol s, MOD A2C: 125.0 ml LV vol s, MOD A4C: 128.0 ml LV SV MOD A2C:     60.0 ml LV SV MOD A4C:     156.0 ml LV SV MOD BP:      45.4 ml RIGHT VENTRICLE             IVC RV S prime:     11.20 cm/s  IVC diam: 2.60 cm TAPSE (M-mode): 1.2 cm LEFT ATRIUM             Index        RIGHT ATRIUM           Index LA diam:        4.10 cm 1.97 cm/m   RA Area:     12.20 cm LA Vol (A2C):   52.9 ml 25.40 ml/m  RA Volume:   27.30 ml  13.11 ml/m LA Vol (A4C):   50.6 ml 24.30 ml/m LA Biplane Vol: 56.3 ml 27.03 ml/m  AORTIC VALVE LVOT Vmax:   86.20 cm/s LVOT Vmean:  57.900 cm/s LVOT VTI:    0.119 m  AORTA Ao Root diam: 3.65 cm Ao Asc diam:  3.70 cm MITRAL VALVE                TRICUSPID VALVE MV Area (PHT): 5.27 cm     TR Peak grad:   27.2 mmHg MV Area VTI:   1.94 cm     TR Vmax:        261.00 cm/s MV Peak grad:  9.5 mmHg MV Mean grad:  3.0 mmHg     SHUNTS MV Vmax:       1.54 m/s     Systemic VTI:  0.12 m MV  Vmean:      83.4 cm/s    Systemic Diam: 2.20 cm MV Decel Time: 144 msec MV E velocity: 121.00 cm/s Morene Brownie Electronically signed by Morene Brownie Signature Date/Time:  09/23/2023/3:27:26 PM    Final    US  THYROID  Result Date: 09/23/2023 CLINICAL DATA:  Hyperthyroid.  Thyrotoxicosis. EXAM: THYROID  ULTRASOUND TECHNIQUE: Ultrasound examination of the thyroid  gland and adjacent soft tissues was performed. COMPARISON:  None Available. FINDINGS: Parenchymal Echotexture: Normal Isthmus: 0.5 cm Right lobe: 4.6 x 2.4 x 1.8 cm Left lobe: 5.2 x 1.5 x 1.8 cm _________________________________________________________ Estimated total number of nodules >/= 1 cm: 0 Number of spongiform nodules >/=  2 cm not described below (TR1): 0 Number of mixed cystic and solid nodules >/= 1.5 cm not described below (TR2): 0 _________________________________________________________ No discrete nodules are seen within the thyroid  gland. No enlarged or abnormal appearing lymph nodes are identified. IMPRESSION: Normal thyroid  ultrasound. Electronically Signed   By: Marcey Moan M.D.   On: 09/23/2023 14:23   DG Chest 2 View Result Date: 09/23/2023 CLINICAL DATA:  Cardiac palpitations EXAM: CHEST - 2 VIEW COMPARISON:  09/18/2023 FINDINGS: Cardiac shadow is within normal limits. Lungs are well aerated bilaterally. No focal infiltrate or effusion is seen. No bony abnormality is noted. IMPRESSION: No active cardiopulmonary disease. Electronically Signed   By: Oneil Devonshire M.D.   On: 09/23/2023 01:19   DG Chest Portable 1 View Result Date: 09/18/2023 CLINICAL DATA:  sob EXAM: PORTABLE CHEST 1 VIEW COMPARISON:  Chest x-ray 09/08/2023 FINDINGS: The heart and mediastinal contours are within normal limits. No focal consolidation. No pulmonary edema. No pleural effusion. No pneumothorax. No acute osseous abnormality. IMPRESSION: No active disease. Electronically Signed   By: Morgane  Naveau M.D.   On: 09/18/2023 18:31   DG Chest Port 1  View Result Date: 09/08/2023 CLINICAL DATA:  Chest pain and tachycardia. EXAM: PORTABLE CHEST 1 VIEW COMPARISON:  Chest x-ray 09/17/2021 FINDINGS: External pacer paddles are noted. The heart is normal in size. The mediastinal and hilar contours are normal. The lungs are clear. No infiltrates, effusions or edema. No pneumothorax. No pulmonary lesions. The bony thorax is intact. IMPRESSION: No acute cardiopulmonary findings. Electronically Signed   By: MYRTIS Stammer M.D.   On: 09/08/2023 16:28     The results of significant diagnostics from this hospitalization (including imaging, microbiology, ancillary and laboratory) are listed below for reference.     Microbiology: No results found for this or any previous visit (from the past 240 hours).   Labs: BNP (last 3 results) Recent Labs    09/04/23 0727  BNP 154.9*   Basic Metabolic Panel: Recent Labs  Lab 09/30/23 0513 10/01/23 0500 10/02/23 0414 10/02/23 0803 10/03/23 0414 10/04/23 0400  NA 139 137  --  137 137 139  K 4.1 4.2  --  4.1 4.5 4.3  CL 102 104  --  102 105 108  CO2 29 27  --  27 26 24   GLUCOSE 98 96  --  131* 102* 131*  BUN 17 19  --  25* 25* 29*  CREATININE 0.92 0.92  --  0.84 1.13 0.76  CALCIUM  8.4* 8.5*  --  8.8* 8.4* 8.4*  MG 2.0 2.1 2.0  --  2.0 2.1  PHOS 3.8 3.7 3.7  --  3.8 3.4   Liver Function Tests: No results for input(s): AST, ALT, ALKPHOS, BILITOT, PROT, ALBUMIN in the last 168 hours. No results for input(s): LIPASE, AMYLASE in the last 168 hours. No results for input(s): AMMONIA in the last 168 hours. CBC: Recent Labs  Lab 09/28/23 0445 09/29/23 0533 09/30/23 0513 10/01/23 0500  WBC 8.5 7.0 7.7 6.4  HGB 13.4 13.6 13.7  14.6  HCT 39.3 39.7 40.2 42.9  MCV 93.6 93.4 92.2 92.5  PLT 156 164 174 185   Cardiac Enzymes: No results for input(s): CKTOTAL, CKMB, CKMBINDEX, TROPONINI in the last 168 hours. BNP: Invalid input(s): POCBNP CBG: Recent Labs  Lab 09/30/23 2123  10/01/23 0754 10/01/23 1213 10/01/23 1549 10/03/23 1233  GLUCAP 122* 147* 133* 98 113*   D-Dimer No results for input(s): DDIMER in the last 72 hours. Hgb A1c No results for input(s): HGBA1C in the last 72 hours. Lipid Profile No results for input(s): CHOL, HDL, LDLCALC, TRIG, CHOLHDL, LDLDIRECT in the last 72 hours. Thyroid  function studies No results for input(s): TSH, T4TOTAL, T3FREE, THYROIDAB in the last 72 hours.  Invalid input(s): FREET3 Anemia work up No results for input(s): VITAMINB12, FOLATE, FERRITIN, TIBC, IRON, RETICCTPCT in the last 72 hours. Urinalysis    Component Value Date/Time   COLORURINE RED (A) 02/25/2022 0632   APPEARANCEUR CLOUDY (A) 02/25/2022 0632   LABSPEC 1.025 02/25/2022 0632   PHURINE  02/25/2022 0632    TEST NOT REPORTED DUE TO COLOR INTERFERENCE OF URINE PIGMENT   GLUCOSEU (A) 02/25/2022 0632    TEST NOT REPORTED DUE TO COLOR INTERFERENCE OF URINE PIGMENT   HGBUR (A) 02/25/2022 0632    TEST NOT REPORTED DUE TO COLOR INTERFERENCE OF URINE PIGMENT   BILIRUBINUR (A) 02/25/2022 0632    TEST NOT REPORTED DUE TO COLOR INTERFERENCE OF URINE PIGMENT   KETONESUR (A) 02/25/2022 0632    TEST NOT REPORTED DUE TO COLOR INTERFERENCE OF URINE PIGMENT   PROTEINUR (A) 02/25/2022 0632    TEST NOT REPORTED DUE TO COLOR INTERFERENCE OF URINE PIGMENT   NITRITE (A) 02/25/2022 0632    TEST NOT REPORTED DUE TO COLOR INTERFERENCE OF URINE PIGMENT   LEUKOCYTESUR (A) 02/25/2022 0632    TEST NOT REPORTED DUE TO COLOR INTERFERENCE OF URINE PIGMENT   Sepsis Labs Recent Labs  Lab 09/28/23 0445 09/29/23 0533 09/30/23 0513 10/01/23 0500  WBC 8.5 7.0 7.7 6.4   Microbiology No results found for this or any previous visit (from the past 240 hours).   Time coordinating discharge:  I have spent 35 minutes face to face with the patient and on the ward discussing the patients care, assessment, plan and disposition with other care  givers. >50% of the time was devoted counseling the patient about the risks and benefits of treatment/Discharge disposition and coordinating care.   SIGNED:   Burgess JAYSON Dare, MD  [facility] 10/04/2023, 11:27 AM   If 7PM-7AM, please contact night-coverage

## 2023-10-05 ENCOUNTER — Encounter (HOSPITAL_COMMUNITY): Payer: Self-pay | Admitting: Cardiology

## 2023-10-07 ENCOUNTER — Encounter (HOSPITAL_COMMUNITY)

## 2023-10-07 ENCOUNTER — Ambulatory Visit (HOSPITAL_COMMUNITY): Payer: Self-pay | Admitting: Adult Health

## 2023-10-07 LAB — ECHO TEE: Est EF: <20

## 2023-10-10 ENCOUNTER — Encounter (HOSPITAL_COMMUNITY): Admitting: Cardiology

## 2023-10-10 ENCOUNTER — Ambulatory Visit (HOSPITAL_COMMUNITY)
Admission: RE | Admit: 2023-10-10 | Discharge: 2023-10-10 | Disposition: A | Discharge status: Home / Self Care | Source: Ambulatory Visit | Attending: Internal Medicine | Admitting: Internal Medicine

## 2023-10-10 ENCOUNTER — Ambulatory Visit (INDEPENDENT_AMBULATORY_CARE_PROVIDER_SITE_OTHER): Admitting: "Endocrinology

## 2023-10-10 ENCOUNTER — Encounter: Payer: Self-pay | Admitting: "Endocrinology

## 2023-10-10 VITALS — BP 110/64 | HR 61 | Ht 72.0 in | Wt 188.4 lb

## 2023-10-10 VITALS — BP 110/60 | HR 71 | Ht 72.0 in | Wt 189.0 lb

## 2023-10-10 DIAGNOSIS — I4891 Unspecified atrial fibrillation: Secondary | ICD-10-CM | POA: Diagnosis present

## 2023-10-10 DIAGNOSIS — Z79899 Other long term (current) drug therapy: Secondary | ICD-10-CM | POA: Insufficient documentation

## 2023-10-10 DIAGNOSIS — E0590 Thyrotoxicosis, unspecified without thyrotoxic crisis or storm: Secondary | ICD-10-CM

## 2023-10-10 DIAGNOSIS — I4819 Other persistent atrial fibrillation: Secondary | ICD-10-CM | POA: Diagnosis present

## 2023-10-10 DIAGNOSIS — D6869 Other thrombophilia: Secondary | ICD-10-CM | POA: Diagnosis present

## 2023-10-10 DIAGNOSIS — Z5181 Encounter for therapeutic drug level monitoring: Secondary | ICD-10-CM | POA: Insufficient documentation

## 2023-10-10 MED ORDER — DOFETILIDE 125 MCG PO CAPS
125.0000 ug | ORAL_CAPSULE | Freq: Two times a day (BID) | ORAL | 1 refills | Status: DC
Start: 2023-10-10 — End: 2024-01-01

## 2023-10-10 MED ORDER — METOPROLOL SUCCINATE ER 50 MG PO TB24
50.0000 mg | ORAL_TABLET | Freq: Every day | ORAL | 1 refills | Status: DC
Start: 2023-10-10 — End: 2023-12-27

## 2023-10-10 MED ORDER — ELIQUIS 5 MG PO TABS: 5.0000 mg | ORAL_TABLET | Freq: Two times a day (BID) | ORAL | 1 refills | Status: DC

## 2023-10-10 MED FILL — Fentanyl Citrate Preservative Free (PF) Inj 100 MCG/2ML: INTRAMUSCULAR | Qty: 2 | Status: AC

## 2023-10-10 NOTE — Progress Notes (Signed)
 Primary Care Physician: Darra Hamilton, PA-C Primary Cardiologist: None Electrophysiologist: Will Gladis Norton, MD     Referring Physician: Dr. Norton Tommy Rowe is a 68 y.o. male with a history of chronic combined systolic and diastolic HF, CAD s/p stent to LAD (08/2021), HTN, HLD, bradycardia, LBBB, hyperthyroidism, and atrial fibrillation/flutter who presents for consultation in the [facility]. Hospital admission 6/23-10/04/2023 for thyroid  storm/hyperthyroidism drive Afib with RVR contributed by amiodarone . Acute HFrEF likely tachy-mediated, loaded on Tikosyn  and s/p Afib/flutter ablation on 7/3. Patient is on Eliquis  5 mg BID for a CHADS2VASC score of 4.  On evaluation today, he is currently in NSR. No episodes of Afib since ablation. No chest pain or SOB. Leg sites healing without issue; small knot on right side. He is feeling better since hospital discharge. No missed doses of Tikosyn . No missed doses of anticoagulant.  Today, he denies symptoms of orthopnea, PND, lower extremity edema, dizziness, presyncope, syncope, snoring, daytime somnolence, bleeding, or neurologic sequela. The patient is tolerating medications without difficulties and is otherwise without complaint today.    he has a BMI of Body mass index is 25.55 kg/m.SABRA Filed Weights   10/10/23 0847  Weight: 85.5 kg    Current Outpatient Medications  Medication Sig Dispense Refill   desonide (DESOWEN) 0.05 % ointment Apply 1 Application topically daily as needed.     dofetilide  (TIKOSYN ) 125 MCG capsule Take 1 capsule (125 mcg total) by mouth 2 (two) times daily. 60 capsule 0   doxylamine , Sleep, (UNISOM ) 25 MG tablet Take 12.5 mg by mouth at bedtime as needed for sleep.     ELIQUIS  5 MG TABS tablet Take 5 mg by mouth 2 (two) times daily.     empagliflozin  (JARDIANCE ) 10 MG TABS tablet Take 1 tablet (10 mg total) by mouth daily. 30 tablet 0   methimazole  (TAPAZOLE ) 5 MG tablet Take 1  tablet (5 mg total) by mouth 3 (three) times daily. 90 tablet 0   metoprolol  succinate (TOPROL  XL) 50 MG 24 hr tablet Take 1 tablet (50 mg total) by mouth daily. Take with or immediately following a meal. 30 tablet 0   Multiple Vitamin (MULTIVITAMIN WITH MINERALS) TABS tablet Take 1 tablet by mouth every morning. Centrum Silver for Men 50+     rosuvastatin  (CRESTOR ) 20 MG tablet Take 20 mg by mouth at bedtime.     sertraline  (ZOLOFT ) 50 MG tablet Take 1 tablet (50 mg total) by mouth at bedtime. 30 tablet 0   sildenafil  (VIAGRA ) 100 MG tablet Take 100 mg by mouth as needed for erectile dysfunction.     tamsulosin  (FLOMAX ) 0.4 MG CAPS capsule Take 1 capsule (0.4 mg total) by mouth daily. 30 capsule 11   triamcinolone cream (KENALOG) 0.1 % Apply 1 Application topically as needed.     No current facility-administered medications for this encounter.   Facility-Administered Medications Ordered in Other Encounters  Medication Dose Route Frequency Provider Last Rate Last Admin   sodium chloride  flush (NS) 0.9 % injection 3 mL  3 mL Intravenous Q12H Scoggins, Amber, NP        Atrial Fibrillation Management history:  Previous antiarrhythmic drugs: amiodarone , multaq , tikosyn  Previous cardioversions: multiple, most recently 09/24/23 Previous ablations: 10/03/23 Anticoagulation history: Eliquis    ROS- All systems are reviewed and negative except as per the HPI above.  Physical Exam: BP 110/64   Pulse 61   Ht 6' (1.829 m)   Wt 85.5 kg  BMI 25.55 kg/m   GEN: Well nourished, well developed in no acute distress NECK: No JVD; No carotid bruits CARDIAC: Regular rate and rhythm, no murmurs, rubs, gallops RESPIRATORY:  Clear to auscultation without rales, wheezing or rhonchi  ABDOMEN: Soft, non-tender, non-distended EXTREMITIES:  No edema; No deformity   EKG today demonstrates  Vent. rate 61 BPM PR interval 160 ms QRS duration 134 ms QT/QTcB 462/465 ms P-R-T axes 75 81 260 Normal sinus  rhythm Left ventricular hypertrophy with QRS widening and repolarization abnormality ( Cornell product ) Abnormal ECG When compared with ECG of 04-Oct-2023 04:24, Left bundle branch block is no longer Present  Echo 09/23/23 demonstrated   1. Left ventricular ejection fraction, by estimation, is 20 to 25%. The  left ventricle has severely decreased function. The left ventricle  demonstrates global hypokinesis. The left ventricular internal cavity size  was moderately to severely dilated.  Left ventricular diastolic parameters are indeterminate.   2. Right ventricular systolic function is moderately reduced. The right  ventricular size is mildly enlarged. There is mildly elevated pulmonary  artery systolic pressure. The estimated right ventricular systolic  pressure is 42.2 mmHg.   3. The mitral valve is degenerative. Moderate to severe mitral valve  regurgitation. Mild mitral stenosis. Restrictive posterior leafleft,  central MR jet. Recommend repeat echo when better compensated.   4. The aortic valve is tricuspid. There is mild calcification of the  aortic valve. Aortic valve regurgitation is trivial. Aortic valve  sclerosis is present, with no evidence of aortic valve stenosis.   5. The inferior vena cava is dilated in size with <50% respiratory  variability, suggesting right atrial pressure of 15 mmHg.    ASSESSMENT & PLAN CHA2DS2-VASc Score = 4  The patient's score is based upon: CHF History: 1 HTN History: 1 Diabetes History: 0 Stroke History: 0 Vascular Disease History: 1 Age Score: 1 Gender Score: 0       ASSESSMENT AND PLAN: Persistent Atrial Fibrillation / atrial flutter (ICD10:  I48.19) The patient's CHA2DS2-VASc score is 4, indicating a 4.8% annual risk of stroke.   S/p Tikosyn  load / ablation 10/03/23 by Dr. Inocencio.  He is currently in NSR. He is doing well overall and feels better since discharge. He will follow up for Tikosyn  in [place] due to proximity to  home.   High risk medication monitoring (ICD10: [id number]) Patient requires ongoing monitoring for anti-arrhythmic medication which has the potential to cause life threatening arrhythmias or AV block. Qtc stable. Continue Tikosyn  125 mcg BID. Bmet and mag drawn today.  Secondary Hypercoagulable State (ICD10:  D68.69) The patient is at significant risk for stroke/thromboembolism based upon his CHA2DS2-VASc Score of 4.  Continue Apixaban  (Eliquis ).  Continue Eliquis  5 mg BID without interruption.     Follow up as scheduled for 1 month Tikosyn  surveillance.   Terra Pac, PA-C  [facility] [address] [place], [place] [address] [phone]

## 2023-10-10 NOTE — Progress Notes (Signed)
   ADVANCED HEART FAILURE CLINIC NOTE  Referring Physician: Darra Hamilton, PA-C  Primary Care: Darra Hamilton, PA-C Primary Cardiologist: Heart Failure: Ria Commander, DO  CC: HFREF  HPI: Tommy Rowe is a 68 y.o. 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   Pertinent Family & social hx: No significant pertinent history  Cardiac History:  - PCI to the LAD 08/2021 - Seen inpatient in 6/25 by Dr. Kennyth for atrial fibrillation with plan for outpatient cardioversion.  - Admitted to [facility] in 7/25 with cardiogenic shock 2/2 hyperthyroid induced atrial fibrillation. TTE w/ severely reduced LV function. Started on methimazole  with eventual conversion to sinus rhythm with Tikosyn  load.  - Afib/AFL ablation by Dr. Inocencio on 10/03/23   Interval hx:  - Significant improvement in functional status. Performs all ADL independently with minimal limitations. Some dyspnea with moderate to excessive exertion. Compliant with all meds. Very motivated to improve his health.    PHYSICAL EXAM: Vitals:   10/11/23 1349  BP: 131/86  Pulse: 69  SpO2: 97%   Lungs- normal work of breathing CARDIAC:  JVP: 7 cm          Normal rate with regular rhythm. no murmur.  Pulses 2+. no edema.  ABDOMEN: soft EXTREMITIES: Warm and well perfused.   DATA REVIEW  ECG: 10/10/23: sinus bradycardia with LBBB  As per my personal interpretation  ECHO: 09/23/23: LVEF 20-25%, moderately reduced RV function 09/19/21: LVEF 45-50%  CATH: 6/23:    Mid LAD-2 lesion is 100% stenosed.   Mid LAD-1 lesion is 70% stenosed.   2nd Diag lesion is 75% stenosed.   Dist RCA lesion is 50% stenosed.   A drug-eluting stent was successfully placed using a STENT ONYX FRONTIER 2.5X22.   Post intervention, there is a 0% residual stenosis.   There is moderate left ventricular systolic dysfunction.   LV end diastolic pressure is mildly  elevated.   The left ventricular ejection fraction is 35-45% by visual estimate.    ASSESSMENT & PLAN:  Heart Failure with reduced ejection fraction - Etiology & History: predominantly nonischemic 2/2 atrial fibrillation - NYHA Class: II - Volume status: euvolemic - GDMT:  -  RAASi: start losartan  12.5mg  daily. sCr 0.63 on 10/10/23.  Beta-blocker: metoprolol  succinate 50mg  daily -  SGLT2i: jardiance  10mg   - ICD: not indicated; repeat TTE at follow up - Advanced therapies: not indicated  2. Atrial fibrillation  - Difficult to control atrial fibrillation secondary to thyroid  storm  - Continue tikosyn  125mcg - s/p AF/AFL ablation  - NSR today.  - Apixaban  5mg  BID  3. Hyperthyroidism  - T3 is now back up to 5 on 10/10/23, T4 2.2 from 1.37.  - Methimazole  5mg  TID - Established care with endocrinology  4. CAD - s/p PCI to the LAD in 2023 - ASA + apixaban   I spent 60 minutes caring for this patient today including face to face time, ordering and reviewing labs, reviewing records from hospitalization noted above, discussing treatment for his HFrEF, seeing the patient, documenting in the record, and arranging follow ups.    Frona Yost Advanced Heart Failure Mechanical Circulatory Support

## 2023-10-10 NOTE — Progress Notes (Signed)
 Outpatient Endocrinology Note [name], MD  10/10/23   Tommy Rowe March 05, 1956 [id number]  Referring Provider: Darra Hamilton, PA-C Primary Care Provider: Darra Hamilton, PA-C Subjective  No chief complaint on file.   Assessment & Plan  Diagnoses and all orders for this visit:  Hyperthyroidism -     TSH -     T4, free -     T3, free -     TRAb (TSH Receptor Binding Antibody) -     Thyroid  stimulating immunoglobulin   JAKWAN SALLY is currently taking methimazole  5 mg tid.. Patient was biochemically hyperthyroid.  Discussed the etiology for hyperthyroidism. Educated on thyroid  axis.  Recommend the following: Take methimazole  5 mg thrice a day. Repeat labs in 3 months or sooner if symptoms of hyper or hypothyroidism develop.  -complications of untreated hyperthyroidism including atrial fibrillation, heart failure and osteoporosis -side effects of Methimazole  including but not limited to allergic reaction, rash, bone marrow suppression, liver dysfunction -compliance and follow up needs    If you notice any symptoms of worsening fatigue, fever with sore throat, loss of appetite, yellowing of eyes, dark urine, joint pains, sores in the mouth, itchy rash, light colored stools or abdominal pain, please stop the medication and call us  immediately as this can be a serious side effect of the medication.  09/23/23 THYROID  ULTRASOUND images and report reviewed, within normal limits No follow-up indicated unless clinically warranted  I have reviewed current medications, nurse's notes, allergies, vital signs, past medical and surgical history, family medical history, and social history for this encounter. Counseled patient on symptoms, examination findings, lab findings, imaging results, treatment decisions and monitoring and prognosis. The patient understood the recommendations and agrees with the treatment plan. All questions regarding treatment plan were fully  answered.   Return in about 6 weeks (around 11/21/2023) for visit + labs before next visit, labs today.   [name], MD  10/10/23   I have reviewed current medications, nurse's notes, allergies, vital signs, past medical and surgical history, family medical history, and social history for this encounter. Counseled patient on symptoms, examination findings, lab findings, imaging results, treatment decisions and monitoring and prognosis. The patient understood the recommendations and agrees with the treatment plan. All questions regarding treatment plan were fully answered.   History of Present Illness Tommy Rowe is a 68 y.o. year old male who presents to our clinic with hyperthyroidism diagnosed in 2025.    Was in hospital for Atrial fibrillation s/p cardioversion and amiodarone  use, leading to thyroid  storm. He finally had atrial ablation. He is no longer on amiodarone .  He is currently taking methimazole  5 mg tid.  Symptoms suggestive of HYPOTHYROIDISM:  fatigue Yes weight gain Yes cold intolerance  No constipation  No  Symptoms suggestive of HYPERTHYROIDISM:  weight loss  No, resolved now heat intolerance No hyperdefecation  No palpitations  No  Compressive symptoms:  dysphagia  No dysphonia  No positional dyspnea (especially with simultaneous arms elevation)  No  Smokes  No On biotin  No Personal history of head/neck surgery/irradiation  No  Adverse Drug Effects from Methimazole  (MMI): rash No fever No throat pain No arthritis No mouth ulcers No jaundice No loss of appetite No lymphadenopathy No  Grave's Ophthalmopathy Clinical Activity Score: 0/9  09/23/23 THYROID  ULTRASOUND   TECHNIQUE: Ultrasound examination of the thyroid  gland and adjacent soft tissues was performed.   COMPARISON:  None Available.   FINDINGS: Parenchymal Echotexture: Normal   Isthmus: 0.5  cm   Right lobe: 4.6 x 2.4 x 1.8 cm   Left lobe: 5.2 x 1.5 x 1.8 cm    _________________________________________________________   Estimated total number of nodules >/= 1 cm: 0   Number of spongiform nodules >/=  2 cm not described below (TR1): 0   Number of mixed cystic and solid nodules >/= 1.5 cm not described below (TR2): 0   _________________________________________________________   No discrete nodules are seen within the thyroid  gland. No enlarged or abnormal appearing lymph nodes are identified.   IMPRESSION: Normal thyroid  ultrasound.  Physical Exam  BP 110/60   Pulse 71   Ht 6' (1.829 m)   Wt 189 lb (85.7 kg)   SpO2 98%   BMI 25.63 kg/m  Constitutional: well developed, well nourished Head: normocephalic, atraumatic, no exophthalmos Eyes: sclera anicteric, no redness Neck: no thyromegaly, no thyroid  tenderness; no nodules palpated Lungs: normal respiratory effort Neurology: alert and oriented, no fine hand tremor Skin: dry, no appreciable rashes Musculoskeletal: no appreciable defects Psychiatric: normal mood and affect  Allergies Allergies  Allergen Reactions   Amiodarone  Other (See Comments)    History of Graves Disease    Current Medications Patient's Medications  New Prescriptions   No medications on file  Previous Medications   DESONIDE (DESOWEN) 0.05 % OINTMENT    Apply 1 Application topically daily as needed.   DOFETILIDE  (TIKOSYN ) 125 MCG CAPSULE    Take 1 capsule (125 mcg total) by mouth 2 (two) times daily.   DOXYLAMINE , SLEEP, (UNISOM ) 25 MG TABLET    Take 12.5 mg by mouth at bedtime as needed for sleep.   ELIQUIS  5 MG TABS TABLET    Take 1 tablet (5 mg total) by mouth 2 (two) times daily.   EMPAGLIFLOZIN  (JARDIANCE ) 10 MG TABS TABLET    Take 1 tablet (10 mg total) by mouth daily.   METHIMAZOLE  (TAPAZOLE ) 5 MG TABLET    Take 1 tablet (5 mg total) by mouth 3 (three) times daily.   METOPROLOL  SUCCINATE (TOPROL  XL) 50 MG 24 HR TABLET    Take 1 tablet (50 mg total) by mouth daily. Take with or immediately following  a meal.   MULTIPLE VITAMIN (MULTIVITAMIN WITH MINERALS) TABS TABLET    Take 1 tablet by mouth every morning. Centrum Silver for Men 50+   NITROGLYCERIN  (NITROSTAT ) 0.4 MG SL TABLET    Place 0.4 mg under the tongue every 5 (five) minutes as needed for chest pain.   ROSUVASTATIN  (CRESTOR ) 20 MG TABLET    Take 20 mg by mouth at bedtime.   SERTRALINE  (ZOLOFT ) 50 MG TABLET    Take 1 tablet (50 mg total) by mouth at bedtime.   SILDENAFIL  (VIAGRA ) 100 MG TABLET    Take 100 mg by mouth as needed for erectile dysfunction.   TAMSULOSIN  (FLOMAX ) 0.4 MG CAPS CAPSULE    Take 1 capsule (0.4 mg total) by mouth daily.   TRIAMCINOLONE CREAM (KENALOG) 0.1 %    Apply 1 Application topically as needed.  Modified Medications   No medications on file  Discontinued Medications   No medications on file    Past Medical History Past Medical History:  Diagnosis Date   A-fib (HCC)    Anxiety    HLD (hyperlipidemia)    HTN (hypertension)     Past Surgical History Past Surgical History:  Procedure Laterality Date   A-FLUTTER ABLATION N/A 10/03/2023   Procedure: A-FLUTTER ABLATION;  Surgeon: Inocencio Soyla Lunger, MD;  Location: [facility] INVASIVE  CV LAB;  Service: Cardiovascular;  Laterality: N/A;   ATRIAL FIBRILLATION ABLATION N/A 10/03/2023   Procedure: ATRIAL FIBRILLATION ABLATION;  Surgeon: Inocencio Soyla Lunger, MD;  Location: [facility] INVASIVE CV LAB;  Service: Cardiovascular;  Laterality: N/A;   CORONARY STENT INTERVENTION N/A 09/18/2021   Procedure: CORONARY STENT INTERVENTION;  Surgeon: Florencio Cara BIRCH, MD;  Location: [facility] INVASIVE CV LAB;  Service: Cardiovascular;  Laterality: N/A;   FACIAL FRACTURE SURGERY  1980   LEFT HEART CATH AND CORONARY ANGIOGRAPHY Left 04/11/2021   Procedure: LEFT HEART CATH AND CORONARY ANGIOGRAPHY;  Surgeon: Fernand Denyse LABOR, MD;  Location: [facility] INVASIVE CV LAB;  Service: Cardiovascular;  Laterality: Left;   LEFT HEART CATH AND CORONARY ANGIOGRAPHY N/A 09/18/2021   Procedure: LEFT HEART CATH  AND CORONARY ANGIOGRAPHY;  Surgeon: Florencio Cara BIRCH, MD;  Location: [facility] INVASIVE CV LAB;  Service: Cardiovascular;  Laterality: N/A;   LUMBAR LAMINECTOMY/DECOMPRESSION MICRODISCECTOMY N/A 08/26/2019   Procedure: RIGHT L4-5 MICRODISCECTOMY;  Surgeon: Clois Fret, MD;  Location: [facility] ORS;  Service: Neurosurgery;  Laterality: N/A;    Family History family history includes COPD in his mother; Heart attack in his father; Valvular heart disease in his sister.  Social History Social History   Socioeconomic History   Marital status: Married    Spouse name: Not on file   Number of children: Not on file   Years of education: Not on file   Highest education level: Not on file  Occupational History   Not on file  Tobacco Use   Smoking status: Former    Current packs/day: 0.00    Types: Cigarettes    Quit date: 04/02/1988    Years since quitting: 35.5    Passive exposure: Past   Smokeless tobacco: Never  Vaping Use   Vaping status: Not on file  Substance and Sexual Activity   Alcohol use: No   Drug use: Never   Sexual activity: Not on file  Other Topics Concern   Not on file  Social History Narrative   Not on file   Social Drivers of Health   Financial Resource Strain: Not on file  Food Insecurity: No Food Insecurity (09/23/2023)   Hunger Vital Sign    Worried About Running Out of Food in the Last Year: Never true    Ran Out of Food in the Last Year: Never true  Transportation Needs: No Transportation Needs (09/23/2023)   PRAPARE - Administrator, Civil Service (Medical): No    Lack of Transportation (Non-Medical): No  Physical Activity: Not on file  Stress: Not on file  Social Connections: Moderately Integrated (09/23/2023)   Social Connection and Isolation Panel    Frequency of Communication with Friends and Family: More than three times a week    Frequency of Social Gatherings with Friends and Family: More than three times a week    Attends Religious  Services: More than 4 times per year    Active Member of [employer] or Organizations: No    Attends Banker Meetings: Never    Marital Status: Married  Catering manager Violence: Not At Risk (09/23/2023)   Humiliation, Afraid, Rape, and Kick questionnaire    Fear of Current or Ex-Partner: No    Emotionally Abused: No    Physically Abused: No    Sexually Abused: No    Laboratory Investigations Lab Results  Component Value Date   TSH <0.010 (L) 09/25/2023   TSH <0.010 (L) 09/23/2023   TSH <0.010 (L) 09/18/2023  FREET4 1.37 (H) 10/04/2023   FREET4 1.47 (H) 10/03/2023   FREET4 1.67 (H) 10/02/2023     No results found for: TSI   No components found for: TRAB   Lab Results  Component Value Date   CHOL 109 09/17/2021   Lab Results  Component Value Date   HDL 49 09/17/2021   Lab Results  Component Value Date   LDLCALC 55 09/17/2021   Lab Results  Component Value Date   TRIG 23 09/17/2021   Lab Results  Component Value Date   CHOLHDL 2.2 09/17/2021   Lab Results  Component Value Date   CREATININE 0.76 10/04/2023   No results found for: GFR    Component Value Date/Time   NA 139 10/04/2023 0400   K 4.3 10/04/2023 0400   CL 108 10/04/2023 0400   CO2 24 10/04/2023 0400   GLUCOSE 131 (H) 10/04/2023 0400   BUN 29 (H) 10/04/2023 0400   CREATININE 0.76 10/04/2023 0400   CALCIUM  8.4 (L) 10/04/2023 0400   PROT 4.9 (L) 09/26/2023 0323   ALBUMIN 2.9 (L) 09/26/2023 0323   AST 12 (L) 09/26/2023 0323   ALT 15 09/26/2023 0323   ALKPHOS 36 (L) 09/26/2023 0323   BILITOT 0.7 09/26/2023 0323   GFRNONAA >60 10/04/2023 0400   GFRAA >60 08/21/2019 1017      Latest Ref Rng & Units 10/04/2023    4:00 AM 10/03/2023    4:14 AM 10/02/2023    8:03 AM  BMP  Glucose 70 - 99 mg/dL 868  897  868   BUN 8 - 23 mg/dL 29  25  25    Creatinine 0.61 - 1.24 mg/dL 9.23  8.86  9.15   Sodium 135 - 145 mmol/L 139  137  137   Potassium 3.5 - 5.1 mmol/L 4.3  4.5  4.1   Chloride 98 -  111 mmol/L 108  105  102   CO2 22 - 32 mmol/L 24  26  27    Calcium  8.9 - 10.3 mg/dL 8.4  8.4  8.8        Component Value Date/Time   WBC 6.4 10/01/2023 0500   RBC 4.64 10/01/2023 0500   HGB 14.6 10/01/2023 0500   HCT 42.9 10/01/2023 0500   PLT 185 10/01/2023 0500   MCV 92.5 10/01/2023 0500   MCH 31.5 10/01/2023 0500   MCHC 34.0 10/01/2023 0500   RDW 12.4 10/01/2023 0500   LYMPHSABS 1.4 09/18/2023 1700   MONOABS 0.5 09/18/2023 1700   EOSABS 0.0 09/18/2023 1700   BASOSABS 0.0 09/18/2023 1700      Parts of this note may have been dictated using voice recognition software. There may be variances in spelling and vocabulary which are unintentional. Not all errors are proofread. Please notify the dino if any discrepancies are noted or if the meaning of any statement is not clear.

## 2023-10-11 ENCOUNTER — Ambulatory Visit (HOSPITAL_COMMUNITY): Payer: Self-pay | Admitting: Internal Medicine

## 2023-10-11 ENCOUNTER — Ambulatory Visit (HOSPITAL_COMMUNITY)
Admission: RE | Admit: 2023-10-11 | Discharge: 2023-10-11 | Disposition: A | Discharge status: Home / Self Care | Source: Ambulatory Visit | Attending: Cardiology | Admitting: Cardiology

## 2023-10-11 VITALS — BP 131/86 | HR 69 | Wt 192.0 lb

## 2023-10-11 DIAGNOSIS — I5042 Chronic combined systolic (congestive) and diastolic (congestive) heart failure: Secondary | ICD-10-CM

## 2023-10-11 DIAGNOSIS — I2510 Atherosclerotic heart disease of native coronary artery without angina pectoris: Secondary | ICD-10-CM

## 2023-10-11 DIAGNOSIS — I4891 Unspecified atrial fibrillation: Secondary | ICD-10-CM | POA: Diagnosis not present

## 2023-10-11 DIAGNOSIS — D6869 Other thrombophilia: Secondary | ICD-10-CM | POA: Diagnosis not present

## 2023-10-11 DIAGNOSIS — I4819 Other persistent atrial fibrillation: Secondary | ICD-10-CM

## 2023-10-11 LAB — BASIC METABOLIC PANEL WITH GFR
BUN/Creatinine Ratio: 29 — ABNORMAL HIGH (ref 10–24)
BUN: 18 mg/dL (ref 8–27)
CO2: 20 mmol/L (ref 20–29)
Calcium: 9.3 mg/dL (ref 8.6–10.2)
Chloride: 105 mmol/L (ref 96–106)
Creatinine, Ser: 0.63 mg/dL — ABNORMAL LOW (ref 0.76–1.27)
Glucose: 102 mg/dL — ABNORMAL HIGH (ref 70–99)
Potassium: 4.3 mmol/L (ref 3.5–5.2)
Sodium: 142 mmol/L (ref 134–144)
eGFR: 105 mL/min/1.73 (ref >59)

## 2023-10-11 LAB — MAGNESIUM: Magnesium: 1.9 mg/dL (ref 1.6–2.3)

## 2023-10-11 MED ORDER — NITROGLYCERIN 0.4 MG SL SUBL: 0.4000 mg | SUBLINGUAL_TABLET | SUBLINGUAL | 3 refills | Status: AC | PRN

## 2023-10-11 MED ORDER — LOSARTAN POTASSIUM 25 MG PO TABS: 12.5000 mg | ORAL_TABLET | Freq: Every day | ORAL | 3 refills | Status: DC

## 2023-10-11 NOTE — Patient Instructions (Addendum)
 Start Losartan  12.5 mg daily at bedtime - Rx sent. Return to Heart Failure Pharmacy Clinic in one month - see below. Return to see Dr. Gardenia with echo in 2 months - see below. Please call us  at [phone] if any question or concerns prior to your next appointment.

## 2023-10-14 ENCOUNTER — Ambulatory Visit: Payer: Self-pay

## 2023-10-14 ENCOUNTER — Other Ambulatory Visit: Payer: Self-pay | Admitting: "Endocrinology

## 2023-10-14 ENCOUNTER — Telehealth: Payer: Self-pay | Admitting: "Endocrinology

## 2023-10-14 DIAGNOSIS — E0590 Thyrotoxicosis, unspecified without thyrotoxic crisis or storm: Secondary | ICD-10-CM

## 2023-10-14 LAB — T4, FREE: Free T4: 2.2 ng/dL — ABNORMAL HIGH (ref 0.8–1.8)

## 2023-10-14 LAB — TRAB (TSH RECEPTOR BINDING ANTIBODY): TRAB: <1 IU/L (ref <=2.00)

## 2023-10-14 LAB — TSH: TSH: 0.01 m[IU]/L — ABNORMAL LOW (ref 0.40–4.50)

## 2023-10-14 LAB — T3, FREE: T3, Free: 5 pg/mL — ABNORMAL HIGH (ref 2.3–4.2)

## 2023-10-14 LAB — THYROID STIMULATING IMMUNOGLOBULIN: TSI: 102 %{baseline} (ref <140)

## 2023-10-14 MED ORDER — METHIMAZOLE 5 MG PO TABS: 5.0000 mg | ORAL_TABLET | Freq: Three times a day (TID) | ORAL | 0 refills | Status: DC

## 2023-10-14 MED ORDER — METHIMAZOLE 10 MG PO TABS: 10.0000 mg | ORAL_TABLET | Freq: Two times a day (BID) | ORAL | 1 refills | Status: DC

## 2023-10-14 NOTE — Telephone Encounter (Signed)
 Patient called this morning stating he is out of his methimazole  (TAPAZOLE ) 5 MG tablets.Patient stated the new RX was never sent to the pharmacy,please send new RX to the [id number] - [place], [place] - [address], [place] [address] Phone: [phone]  Fax: [phone]

## 2023-10-14 NOTE — Telephone Encounter (Signed)
 Requested Prescriptions   Signed Prescriptions Disp Refills   methimazole  (TAPAZOLE ) 5 MG tablet 90 tablet 0    Sig: Take 1 tablet (5 mg total) by mouth 3 (three) times daily.    Authorizing Provider: DARTHA ERNST    Ordering User: ARLOA JEOFFREY SAILOR

## 2023-10-28 ENCOUNTER — Encounter (HOSPITAL_COMMUNITY): Payer: Self-pay | Admitting: Cardiology

## 2023-10-31 ENCOUNTER — Ambulatory Visit (HOSPITAL_COMMUNITY): Admitting: Internal Medicine

## 2023-10-31 NOTE — Progress Notes (Signed)
 Advanced Heart Failure Clinic Note   Referring Physician: Darra Hamilton, PA-C  Primary Care: Darra Hamilton, PA-C Primary Cardiologist: Heart Failure: Ria Commander, DO  HPI:  Tommy Rowe is a 68 y.o. male with HFrEF 2/2 atrial fibrillation , CAD s/p PCI to the LAD in 2023, HTN, HLD, LBBB, hyperthyroidism.   Pertinent Family & social hx: No significant pertinent history   Cardiac History:  - PCI to the LAD 08/2021 - Seen inpatient in 09/2023 by Dr. Kennyth for atrial fibrillation with plan for outpatient cardioversion.  - Admitted to [facility] in 10/2023 with cardiogenic shock 2/2 hyperthyroid induced atrial fibrillation. TTE w/ severely reduced LV function. Started on methimazole  with eventual conversion to sinus rhythm with Tikosyn  load.  - Afib/AFL ablation by Dr. Inocencio on 10/03/23   Presented to AHF Clinic for post-hospital follow up with Dr. Commander 10/11/23. Noted significant improvement in functional status. Able to perform all ADLs independently with minimal limitations. Noted some dyspnea with moderate to excessive exertion. Compliant with all meds. Very motivated to improve his health.   Today he returns to HF clinic for pharmacist medication titration. At last visit with MD losartan  12.5 mg daily was initiated. Overall he is feeling well today. Says he has a little fatigue but that's because he doesn't sleep well. Occasional episode of dizziness, usually occurs once every 2-3 weeks and lasts less than 30 seconds. This has greatly improved since his ablation. CP or palpitations. No SOB/DOE. He walks at least 2 miles every morning and also lifts 25 lb weights for exercise. Weight at home has been stable at 186-187 lbs. No LEE, PND or orthopnea. Taking all medications as prescribed and tolerating all medications. Has not missed any doses of Tikosyn .   HF Medications: Metoprolol  succinate 50 mg daily Losartan  12.5 mg daily Jardiance  10 mg daily  Has the patient been experiencing any  side effects to the medications prescribed?  no  Does the patient have any problems obtaining medications due to transportation or finances?   no  Understanding of regimen: excellent Understanding of indications: excellent Potential of compliance: excellent Patient understands to avoid NSAIDs. Patient understands to avoid decongestants.    Pertinent Lab Values: 10/10/23: Serum creatinine 0.63, BUN 18, Potassium 4.3, Sodium 142, Magnesium  1.9,   Vital Signs: Weight: 189.8 lbs (last clinic weight: 189 lbs) Blood pressure: 120/62  Heart rate: 64   Assessment/Plan: Heart Failure with reduced ejection fraction - Etiology & History: predominantly nonischemic 2/2 atrial fibrillation. EF 09/2023 20-25% - NYHA Class: II - Volume status: euvolemic - Does not need a loop diuretic - Continue metoprolol  succinate 50 mg daily - Stop losartan  and start Entresto  24/26 mg BID. No DDI noted with Tikosyn .  - Continue Jardiance  10 mg daily - ICD: not indicated; repeat TTE at follow up - Advanced therapies: not indicated - BMET today pending   2. Atrial fibrillation  - Difficult to control atrial fibrillation secondary to thyroid  storm  - Continue Tikosyn  125 mcg BID, has not missed any doses - s/p AF/AFL ablation 10/03/23 - Continue Apixaban  5 mg BID   3. Hyperthyroidism  - T3 is now back up to 5 on 10/10/23, T4 2.2 from 1.37.  - Methimazole  10 mg BID - Established care with endocrinology   4. CAD - s/p PCI to the LAD in 2023 - ASA + apixaban   Follow up 6 weeks with Dr. Commander + echo.    Tinnie Redman, PharmD, BCPS, BCCP, CPP Heart Failure Clinic Pharmacist [phone]

## 2023-11-10 ENCOUNTER — Other Ambulatory Visit: Payer: Self-pay | Admitting: "Endocrinology

## 2023-11-10 DIAGNOSIS — E0590 Thyrotoxicosis, unspecified without thyrotoxic crisis or storm: Secondary | ICD-10-CM

## 2023-11-11 NOTE — Telephone Encounter (Signed)
 Requested Prescriptions   Pending Prescriptions Disp Refills   methimazole  (TAPAZOLE ) 5 MG tablet [Pharmacy Med Name: METHIMAZOLE  5 MG TABLET] 90 tablet 0    Sig: TAKE 1 TABLET BY MOUTH THREE TIMES A DAY

## 2023-11-12 ENCOUNTER — Other Ambulatory Visit (HOSPITAL_COMMUNITY): Payer: Self-pay

## 2023-11-12 ENCOUNTER — Telehealth (HOSPITAL_COMMUNITY): Payer: Self-pay | Admitting: Pharmacy Technician

## 2023-11-12 ENCOUNTER — Ambulatory Visit (HOSPITAL_COMMUNITY)
Admission: RE | Admit: 2023-11-12 | Discharge: 2023-11-12 | Disposition: A | Discharge status: Home / Self Care | Source: Ambulatory Visit | Attending: Cardiology | Admitting: Cardiology

## 2023-11-12 ENCOUNTER — Ambulatory Visit (HOSPITAL_COMMUNITY): Payer: Self-pay | Admitting: Pharmacist

## 2023-11-12 VITALS — BP 120/62 | HR 64 | Wt 189.8 lb

## 2023-11-12 DIAGNOSIS — Z7984 Long term (current) use of oral hypoglycemic drugs: Secondary | ICD-10-CM | POA: Insufficient documentation

## 2023-11-12 DIAGNOSIS — E785 Hyperlipidemia, unspecified: Secondary | ICD-10-CM | POA: Diagnosis not present

## 2023-11-12 DIAGNOSIS — I5022 Chronic systolic (congestive) heart failure: Secondary | ICD-10-CM | POA: Diagnosis present

## 2023-11-12 DIAGNOSIS — Z7982 Long term (current) use of aspirin: Secondary | ICD-10-CM | POA: Diagnosis not present

## 2023-11-12 DIAGNOSIS — Z9861 Coronary angioplasty status: Secondary | ICD-10-CM | POA: Diagnosis not present

## 2023-11-12 DIAGNOSIS — Z79899 Other long term (current) drug therapy: Secondary | ICD-10-CM | POA: Diagnosis not present

## 2023-11-12 DIAGNOSIS — I4891 Unspecified atrial fibrillation: Secondary | ICD-10-CM | POA: Diagnosis not present

## 2023-11-12 DIAGNOSIS — I2510 Atherosclerotic heart disease of native coronary artery without angina pectoris: Secondary | ICD-10-CM | POA: Insufficient documentation

## 2023-11-12 DIAGNOSIS — I110 Hypertensive heart disease with heart failure: Secondary | ICD-10-CM | POA: Diagnosis not present

## 2023-11-12 DIAGNOSIS — I447 Left bundle-branch block, unspecified: Secondary | ICD-10-CM | POA: Insufficient documentation

## 2023-11-12 DIAGNOSIS — E0590 Thyrotoxicosis, unspecified without thyrotoxic crisis or storm: Secondary | ICD-10-CM | POA: Diagnosis not present

## 2023-11-12 DIAGNOSIS — I5042 Chronic combined systolic (congestive) and diastolic (congestive) heart failure: Secondary | ICD-10-CM | POA: Diagnosis not present

## 2023-11-12 LAB — BASIC METABOLIC PANEL WITH GFR
Anion gap: 10 (ref 5–15)
BUN: 23 mg/dL (ref 8–23)
CO2: 24 mmol/L (ref 22–32)
Calcium: 9.3 mg/dL (ref 8.9–10.3)
Chloride: 104 mmol/L (ref 98–111)
Creatinine, Ser: 0.64 mg/dL (ref 0.61–1.24)
GFR, Estimated: >60 mL/min (ref >60)
Glucose, Bld: 89 mg/dL (ref 70–99)
Potassium: 4.2 mmol/L (ref 3.5–5.1)
Sodium: 138 mmol/L (ref 135–145)

## 2023-11-12 MED ORDER — ENTRESTO 24-26 MG PO TABS: 1.0000 | ORAL_TABLET | Freq: Two times a day (BID) | ORAL | 2 refills | Status: AC

## 2023-11-12 NOTE — Patient Instructions (Signed)
 It was a pleasure seeing you today!  MEDICATIONS: -We are changing your medications today -Stop losartan . Start Entresto  24/26 mg (1 tablet) twice daily. Start this 24 hours after your last dose of losartan . -Call if you have questions about your medications.  LABS: -We will call you if your labs need attention.  NEXT APPOINTMENT: Return to clinic in 6 weeks with Dr. Gardenia.  In general, to take care of your heart failure: -Limit your fluid intake to 2 Liters (half-gallon) per day.   -Limit your salt intake to ideally 2-3 grams (2000-3000 mg) per day. -Weigh yourself daily and record, and bring that weight diary to your next appointment.  (Weight gain of 2-3 pounds in 1 day typically means fluid weight.) -The medications for your heart are to help your heart and help you live longer.   -Please contact us  before stopping any of your heart medications.  Call the clinic at [phone] with questions or to reschedule future appointments.

## 2023-11-12 NOTE — Telephone Encounter (Signed)
 Pharmacy Patient Advocate Encounter  Insurance verification completed.   The patient is insured through [employer] claim for Entresto  (brand name). Currently a quantity of 180 is a 90 day supply and the co-pay is $0 .  This test claim was processed through [facility]- copay amounts may vary at other pharmacies due to pharmacy/plan contracts, or as the patient moves through the different stages of their insurance plan.   Almarie JULIANNA Pa, CPhT

## 2023-11-20 ENCOUNTER — Other Ambulatory Visit

## 2023-11-20 LAB — T4, FREE: Free T4: 1.6 ng/dL (ref 0.8–1.8)

## 2023-11-20 LAB — T3, FREE: T3, Free: 4.5 pg/mL — ABNORMAL HIGH (ref 2.3–4.2)

## 2023-11-20 LAB — TSH: TSH: <0.01 m[IU]/L — ABNORMAL LOW (ref 0.40–4.50)

## 2023-11-22 ENCOUNTER — Ambulatory Visit: Attending: Cardiology | Admitting: Cardiology

## 2023-11-22 VITALS — BP 118/62 | HR 68 | Ht 73.0 in | Wt 190.0 lb

## 2023-11-22 DIAGNOSIS — Z79899 Other long term (current) drug therapy: Secondary | ICD-10-CM | POA: Insufficient documentation

## 2023-11-22 DIAGNOSIS — I4891 Unspecified atrial fibrillation: Secondary | ICD-10-CM | POA: Insufficient documentation

## 2023-11-22 DIAGNOSIS — D6869 Other thrombophilia: Secondary | ICD-10-CM | POA: Diagnosis present

## 2023-11-22 DIAGNOSIS — Z5181 Encounter for therapeutic drug level monitoring: Secondary | ICD-10-CM | POA: Diagnosis present

## 2023-11-22 DIAGNOSIS — I4892 Unspecified atrial flutter: Secondary | ICD-10-CM | POA: Diagnosis present

## 2023-11-22 DIAGNOSIS — I5020 Unspecified systolic (congestive) heart failure: Secondary | ICD-10-CM | POA: Diagnosis present

## 2023-11-22 NOTE — Progress Notes (Signed)
 Electrophysiology Clinic Note    Date:  11/22/2023  Patient ID:  Tommy Rowe, Tommy Rowe 1955-05-30, MRN [id number] PCP:  Darra Hamilton, PA-C  Cardiologist:  Cara JONETTA Lovelace, MD HF cardiologist: [place]  Electrophysiologist:  Will Gladis Norton, MD  Electrophysiology APP:  Roneshia Drew, NP     Discussed the use of AI scribe software for clinical note transcription with the patient, who gave verbal consent to proceed.   Patient Profile    Chief Complaint: AF ablation follow-up  History of Present Illness: Tommy Rowe is a 68 y.o. male with PMH notable for parox AFib, aflutter, HFrEF, CAD s/p PCI, bradycardia, HTN, LBBB, hyperthyroidism; seen today for Will Gladis Norton, MD for routine electrophysiology follow-up s/p AF Ablation.  He was first eval'd by EP 09/2023 while in hospital for AFib w RVR.  He is now s/p AF, aflutter ablation w isolation of pulm veins, posterior wall, and CTI on 7/3 by Dr. Norton. He was tikosyn  loaded during admission.   Since discharge, he has seen endo for hyperthyroid Tommy Rowe) He has also established with HF team, started losartan , planning updated TTE in 2 months to reassess LVEF.  On follow-up today, he has not had any AF episodes since ablation. He continues to take tikosyn  q12h without missing doses, takes eliquis  at same time. No bleeding concerns. He remains active golfing. He has noticed intermittent dizziness with position changes, like putting golf tee down and standing up quickly. The dizziness is brief, and he believes it has improved. He has no presyncope or syncope.      Arrhythmia/Device History AAD Amiodarone  - stopped d/t hyperthyroid Tikosyn  - loaded 09/2023    ROS:  Please see the history of present illness. All other systems are reviewed and otherwise negative.    Physical Exam    VS:  BP 118/62 (BP Location: Left Arm, Patient Position: Sitting, Cuff Size: Normal)   Pulse 68   Ht 6' 1 (1.854 m)   Wt 190 lb  (86.2 kg)   SpO2 97%   BMI 25.07 kg/m  BMI: Body mass index is 25.07 kg/m.      Wt Readings from Last 3 Encounters:  11/22/23 190 lb (86.2 kg)  11/12/23 189 lb 12.8 oz (86.1 kg)  10/11/23 192 lb (87.1 kg)     GEN- The patient is well appearing, alert and oriented x 3 today.   Lungs- Clear to ausculation bilaterally, normal work of breathing.  Heart- Regular rate and rhythm, no murmurs, rubs or gallops Extremities- No peripheral edema, warm, dry    Studies Reviewed   Previous EP, cardiology notes.    EKG is ordered. Personal review of EKG from today shows:    EKG Interpretation Date/Time:  Friday November 22 2023 13:38:18 EDT Ventricular Rate:  68 PR Interval:  150 QRS Duration:  138 QT Interval:  432 QTC Calculation: 459 R Axis:   46  Text Interpretation: Normal sinus rhythm Left bundle branch block Confirmed by Ayson Cherubini [phone]) on 11/22/2023 1:43:48 PM    10/10/2023 EKG - SR at 61, LBBB. QRS 134, QTC 465 (uncorrected for LBBB)   10/04/2023 EKG - SB at 48, LBBB. QRS 138, QTC 512 (uncorrected for LBBB)  TEE, 09/24/2023  1. Left ventricular ejection fraction, by estimation, is <20%. The left ventricle has severely decreased function.   2. Right ventricular systolic function is severely reduced. The right ventricular size is normal.   3. Left atrial size was mildly dilated. No left atrial/left  atrial appendage thrombus was detected.   4. The mitral valve is normal in structure. Mild mitral valve regurgitation.   5. The aortic valve is normal in structure. Aortic valve regurgitation is not visualized.   TTE, 09/23/2023  1. Left ventricular ejection fraction, by estimation, is 20 to 25%. The left ventricle has severely decreased function. The left ventricle demonstrates global hypokinesis. The left ventricular internal cavity size  was moderately to severely dilated. Left ventricular diastolic parameters are indeterminate.   2. Right ventricular systolic function is  moderately reduced. The right ventricular size is mildly enlarged. There is mildly elevated pulmonary artery systolic pressure. The estimated right ventricular systolic pressure is 42.2 mmHg.   3. The mitral valve is degenerative. Moderate to severe mitral valve regurgitation. Mild mitral stenosis. Restrictive posterior leafleft, central MR jet. Recommend repeat echo when better compensated.   4. The aortic valve is tricuspid. There is mild calcification of the aortic valve. Aortic valve regurgitation is trivial. Aortic valve sclerosis is present, with no evidence of aortic valve stenosis.   5. The inferior vena cava is dilated in size with <50% respiratory variability, suggesting right atrial pressure of 15 mmHg.   TTE, 09/19/2021  1. Left ventricular ejection fraction, by estimation, is 45 to 50%. The left ventricle has mildly decreased function. The left ventricle demonstrates regional wall motion abnormalities (see scoring diagram/findings for description). There is mild left ventricular hypertrophy. Left ventricular diastolic parameters are consistent with Grade I diastolic dysfunction (impaired relaxation). There is hypokinesis of the left ventricular, mid-apical anterior wall.   2. Right ventricular systolic function is normal. The right ventricular size is normal.   3. The mitral valve is normal in structure. No evidence of mitral valve regurgitation. No evidence of mitral stenosis.   4. The aortic valve is normal in structure. Aortic valve regurgitation is not visualized. No aortic stenosis is present.   Conclusion(s)/Recommendation(s): Technically difficult study. No subcostal view completed.   LHC, 09/18/2021   Mid LAD-2 lesion is 100% stenosed.   Mid LAD-1 lesion is 70% stenosed.   2nd Diag lesion is 75% stenosed.   Dist RCA lesion is 50% stenosed.   A drug-eluting stent was successfully placed using a STENT ONYX FRONTIER 2.5X22.   Post intervention, there is a 0% residual stenosis.    There is moderate left ventricular systolic dysfunction.   LV end diastolic pressure is mildly elevated.   The left ventricular ejection fraction is 35-45% by visual estimate.   Conclusion Moderately reduced left ventricular function with anterior apical akinesis EF around 35 to 40%  Assessment and Plan     #) Afib #) aflutter S/p AFib, flutter ablation 10/2023 by Dr. Inocencio Loaded on tikosyn  during same admission QTC stable today Update BMP, mag today  #) Hypercoag d/t  afib CHA2DS2-VASc Score = at least 4 [CHF History: 1, HTN History: 1, Diabetes History: 0, Stroke History: 0, Vascular Disease History: 1, Age Score: 1, Gender Score: 0].  Therefore, the patient's annual risk of stroke is 4.8 %.    Stroke ppx - 5mg  eliquis  BID, appropriately dosed No bleeding concerns   #) HFrEF Appears warm and euvolemic on exam GDMT: toprol  50mg  daily, 24-26 entresto , 10mg  jardiance  Maintaining sinus rhythm Recommend he continue to monitor positional dizziness. If worsens, consider stopping entresto . At this time, will continue entresto  at 24-26 dose Established with HF Scheduled for updated TTE soon We briefly discussed that he may have indication for ICD if EF remains reduced, will rediscuss at  next appt      Current medicines are reviewed at length with the patient today.   The patient does not have concerns regarding his medicines.  The following changes were made today:  none  Labs/ tests ordered today include:  Orders Placed This Encounter  Procedures   Basic metabolic panel with GFR   Magnesium    EKG 12-Lead     Disposition: Follow up with Dr. Inocencio  in 2 months  Follow-up with HF as scheduled    Signed, Machael Raine, NP  11/22/23  2:18 PM  Electrophysiology [facility]

## 2023-11-22 NOTE — Patient Instructions (Signed)
 Medication Instructions:  The current medical regimen is effective;  continue present plan and medications as directed. Please refer to the Current Medication list given to you today.   *If you need a refill on your cardiac medications before your next appointment, please call your pharmacy*  Lab Work: Your provider would like for you to have following labs drawn today BMET. MAG.   If you have labs (blood work) drawn today and your tests are completely normal, you will receive your results only by: MyChart Message (if you have MyChart) OR A paper copy in the mail If you have any lab test that is abnormal or we need to change your treatment, we will call you to review the results.  Follow-Up: At [facility], you and your health needs are our priority.  As part of our continuing mission to provide you with exceptional heart care, our providers are all part of one team.  This team includes your primary Cardiologist (physician) and Advanced Practice Providers or APPs (Physician Assistants and Nurse Practitioners) who all work together to provide you with the care you need, when you need it.  Your next appointment:   As scheduled.  We recommend signing up for the patient portal called MyChart.  Sign up information is provided on this After Visit Summary.  MyChart is used to connect with patients for Virtual Visits (Telemedicine).  Patients are able to view lab/test results, encounter notes, upcoming appointments, etc.  Non-urgent messages can be sent to your provider as well.   To learn more about what you can do with MyChart, go to [email or web address].

## 2023-11-23 ENCOUNTER — Ambulatory Visit: Payer: Self-pay | Admitting: Cardiology

## 2023-11-23 LAB — BASIC METABOLIC PANEL WITH GFR
BUN/Creatinine Ratio: 25 — ABNORMAL HIGH (ref 10–24)
BUN: 24 mg/dL (ref 8–27)
CO2: 21 mmol/L (ref 20–29)
Calcium: 9.3 mg/dL (ref 8.6–10.2)
Chloride: 105 mmol/L (ref 96–106)
Creatinine, Ser: 0.96 mg/dL (ref 0.76–1.27)
Glucose: 104 mg/dL — ABNORMAL HIGH (ref 70–99)
Potassium: 4.4 mmol/L (ref 3.5–5.2)
Sodium: 140 mmol/L (ref 134–144)
eGFR: 87 mL/min/1.73 (ref >59)

## 2023-11-23 LAB — MAGNESIUM: Magnesium: 2.1 mg/dL (ref 1.6–2.3)

## 2023-11-25 ENCOUNTER — Encounter: Payer: Self-pay | Admitting: "Endocrinology

## 2023-11-25 ENCOUNTER — Ambulatory Visit (INDEPENDENT_AMBULATORY_CARE_PROVIDER_SITE_OTHER): Admitting: "Endocrinology

## 2023-11-25 VITALS — BP 104/80 | HR 84 | Ht 72.0 in | Wt 190.0 lb

## 2023-11-25 DIAGNOSIS — E0590 Thyrotoxicosis, unspecified without thyrotoxic crisis or storm: Secondary | ICD-10-CM | POA: Diagnosis not present

## 2023-11-25 MED ORDER — METHIMAZOLE 10 MG PO TABS: ORAL_TABLET | ORAL | 1 refills | Status: DC

## 2023-11-25 NOTE — Progress Notes (Signed)
 Outpatient Endocrinology Note [name], MD  11/25/23   Tommy Rowe 89?/?/?? [id number]  Referring Provider: Darra Hamilton, PA-C Primary Care Provider: Darra Hamilton, PA-C Subjective  No chief complaint on file.   Assessment & Plan  Diagnoses and all orders for this visit:  Hyperthyroidism -     TSH -     T3, free -     T4, free  Other orders -     methimazole  (TAPAZOLE ) 10 MG tablet; 10 mg in morning, 5 mg in afternoon and 10 mg at night    Tommy Rowe is currently taking methimazole  10 mg bid. Patient was biochemically hyperthyroid.  Discussed the etiology for hyperthyroidism. Educated on thyroid  axis.  11/25/23: Recommend the following: Take methimazole  10 mg in morning, 5 mg in afternoon and 10 mg at night Repeat labs in 3 months or sooner if symptoms of hyper or hypothyroidism develop.  -complications of untreated hyperthyroidism including atrial fibrillation, heart failure and osteoporosis -side effects of Methimazole  including but not limited to allergic reaction, rash, bone marrow suppression, liver dysfunction -compliance and follow up needs    If you notice any symptoms of worsening fatigue, fever with sore throat, loss of appetite, yellowing of eyes, dark urine, joint pains, sores in the mouth, itchy rash, light colored stools or abdominal pain, please stop the medication and call us  immediately as this can be a serious side effect of the medication.  09/23/23 THYROID  ULTRASOUND images and report reviewed, within normal limits No follow-up indicated unless clinically warranted  I have reviewed current medications, nurse's notes, allergies, vital signs, past medical and surgical history, family medical history, and social history for this encounter. Counseled patient on symptoms, examination findings, lab findings, imaging results, treatment decisions and monitoring and prognosis. The patient understood the recommendations and agrees with the treatment  plan. All questions regarding treatment plan were fully answered.   Return in about 3 months (around 02/25/2024) for visit + labs before next visit.   [name], MD  11/25/23   I have reviewed current medications, nurse's notes, allergies, vital signs, past medical and surgical history, family medical history, and social history for this encounter. Counseled patient on symptoms, examination findings, lab findings, imaging results, treatment decisions and monitoring and prognosis. The patient understood the recommendations and agrees with the treatment plan. All questions regarding treatment plan were fully answered.   History of Present Illness Tommy Rowe is a 68 y.o. year old male who presents to our clinic with hyperthyroidism diagnosed in 2025.    Was in hospital for Atrial fibrillation s/p cardioversion and amiodarone  use, leading to thyroid  storm. He finally had atrial ablation. He is no longer on amiodarone .  He is currently taking methimazole  5 mg tid.  Symptoms suggestive of HYPOTHYROIDISM:  fatigue Yes, occasionally  weight gain No cold intolerance  No constipation  No  Symptoms suggestive of HYPERTHYROIDISM:  weight loss  No, resolved now heat intolerance No hyperdefecation  No palpitations  No  Compressive symptoms:  dysphagia  No dysphonia  Yes, sometimes  positional dyspnea (especially with simultaneous arms elevation)  No  Smokes  No On biotin  No Personal history of head/neck surgery/irradiation  No  Adverse Drug Effects from Methimazole  (MMI): rash No fever No throat pain No arthritis No mouth ulcers No jaundice No loss of appetite No lymphadenopathy No  Grave's Ophthalmopathy Clinical Activity Score: 0/9  09/23/23 THYROID  ULTRASOUND   TECHNIQUE: Ultrasound examination of the thyroid  gland and adjacent  soft tissues was performed.   COMPARISON:  None Available.   FINDINGS: Parenchymal Echotexture: Normal   Isthmus: 0.5 cm   Right  lobe: 4.6 x 2.4 x 1.8 cm   Left lobe: 5.2 x 1.5 x 1.8 cm   _________________________________________________________   Estimated total number of nodules >/= 1 cm: 0   Number of spongiform nodules >/=  2 cm not described below (TR1): 0   Number of mixed cystic and solid nodules >/= 1.5 cm not described below (TR2): 0   _________________________________________________________   No discrete nodules are seen within the thyroid  gland. No enlarged or abnormal appearing lymph nodes are identified.   IMPRESSION: Normal thyroid  ultrasound.  Physical Exam  BP 104/80   Pulse 84   Ht 6' (1.829 m)   Wt 190 lb (86.2 kg)   SpO2 98%   BMI 25.77 kg/m  Constitutional: well developed, well nourished Head: normocephalic, atraumatic, no exophthalmos Eyes: sclera anicteric, no redness Neck: no thyromegaly, no thyroid  tenderness; no nodules palpated Lungs: normal respiratory effort Neurology: alert and oriented, no fine hand tremor Skin: dry, no appreciable rashes Musculoskeletal: no appreciable defects Psychiatric: normal mood and affect  Allergies Allergies  Allergen Reactions   Amiodarone  Other (See Comments)    History of Graves Disease    Current Medications Patient's Medications  New Prescriptions   No medications on file  Previous Medications   DESONIDE (DESOWEN) 0.05 % OINTMENT    Apply 1 Application topically daily as needed.   DOFETILIDE  (TIKOSYN ) 125 MCG CAPSULE    Take 1 capsule (125 mcg total) by mouth 2 (two) times daily.   DOXYLAMINE , SLEEP, (UNISOM ) 25 MG TABLET    Take 12.5 mg by mouth at bedtime as needed for sleep.   ELIQUIS  5 MG TABS TABLET    Take 1 tablet (5 mg total) by mouth 2 (two) times daily.   EMPAGLIFLOZIN  (JARDIANCE ) 10 MG TABS TABLET    Take 1 tablet (10 mg total) by mouth daily.   ENTRESTO  24-26 MG    Take 1 tablet by mouth 2 (two) times daily.   METOPROLOL  SUCCINATE (TOPROL  XL) 50 MG 24 HR TABLET    Take 1 tablet (50 mg total) by mouth daily. Take  with or immediately following a meal.   MULTIPLE VITAMIN (MULTIVITAMIN WITH MINERALS) TABS TABLET    Take 1 tablet by mouth every morning. Centrum Silver for Men 50+   NITROGLYCERIN  (NITROSTAT ) 0.4 MG SL TABLET    Place 1 tablet (0.4 mg total) under the tongue every 5 (five) minutes as needed for chest pain.   ROSUVASTATIN  (CRESTOR ) 20 MG TABLET    Take 20 mg by mouth at bedtime.   SERTRALINE  (ZOLOFT ) 50 MG TABLET    Take 1 tablet (50 mg total) by mouth at bedtime.   SILDENAFIL  (VIAGRA ) 100 MG TABLET    Take 100 mg by mouth as needed for erectile dysfunction.   TAMSULOSIN  (FLOMAX ) 0.4 MG CAPS CAPSULE    Take 1 capsule (0.4 mg total) by mouth daily.   TRIAMCINOLONE CREAM (KENALOG) 0.1 %    Apply 1 Application topically as needed.  Modified Medications   Modified Medication Previous Medication   METHIMAZOLE  (TAPAZOLE ) 10 MG TABLET methimazole  (TAPAZOLE ) 10 MG tablet      10 mg in morning, 5 mg in afternoon and 10 mg at night    Take 1 tablet (10 mg total) by mouth 2 (two) times daily.  Discontinued Medications   No medications on file  Past Medical History Past Medical History:  Diagnosis Date   A-fib (HCC)    Anxiety    HLD (hyperlipidemia)    HTN (hypertension)     Past Surgical History Past Surgical History:  Procedure Laterality Date   A-FLUTTER ABLATION N/A 10/03/2023   Procedure: A-FLUTTER ABLATION;  Surgeon: Inocencio Soyla Lunger, MD;  Location: [facility];  Service: Cardiovascular;  Laterality: N/A;   ATRIAL FIBRILLATION ABLATION N/A 10/03/2023   Procedure: ATRIAL FIBRILLATION ABLATION;  Surgeon: Inocencio Soyla Lunger, MD;  Location: [facility];  Service: Cardiovascular;  Laterality: N/A;   CORONARY STENT INTERVENTION N/A 09/18/2021   Procedure: CORONARY STENT INTERVENTION;  Surgeon: Florencio Cara BIRCH, MD;  Location: [facility];  Service: Cardiovascular;  Laterality: N/A;   FACIAL FRACTURE SURGERY  1980   LEFT HEART CATH AND CORONARY ANGIOGRAPHY Left  04/11/2021   Procedure: LEFT HEART CATH AND CORONARY ANGIOGRAPHY;  Surgeon: Fernand Denyse LABOR, MD;  Location: [facility];  Service: Cardiovascular;  Laterality: Left;   LEFT HEART CATH AND CORONARY ANGIOGRAPHY N/A 09/18/2021   Procedure: LEFT HEART CATH AND CORONARY ANGIOGRAPHY;  Surgeon: Florencio Cara BIRCH, MD;  Location: [facility];  Service: Cardiovascular;  Laterality: N/A;   LUMBAR LAMINECTOMY/DECOMPRESSION MICRODISCECTOMY N/A 08/26/2019   Procedure: RIGHT L4-5 MICRODISCECTOMY;  Surgeon: Clois Fret, MD;  Location: [facility];  Service: Neurosurgery;  Laterality: N/A;    Family History family history includes COPD in his mother; Heart attack in his father; Valvular heart disease in his sister.  Social History Social History   Socioeconomic History   Marital status: Married    Spouse name: Not on file   Number of children: Not on file   Years of education: Not on file   Highest education level: Not on file  Occupational History   Not on file  Tobacco Use   Smoking status: Former    Current packs/day: 0.00    Types: Cigarettes    Quit date: 04/02/1988    Years since quitting: 35.6    Passive exposure: Past   Smokeless tobacco: Never  Vaping Use   Vaping status: Not on file  Substance and Sexual Activity   Alcohol use: No   Drug use: Never   Sexual activity: Not on file  Other Topics Concern   Not on file  Social History Narrative   Not on file   Social Drivers of Health   Financial Resource Strain: Not on file  Food Insecurity: No Food Insecurity (09/23/2023)   Hunger Vital Sign    Worried About Running Out of Food in the Last Year: Never true    Ran Out of Food in the Last Year: Never true  Transportation Needs: No Transportation Needs (09/23/2023)   PRAPARE - Administrator, Civil Service (Medical): No    Lack of Transportation (Non-Medical): No  Physical Activity: Not on file  Stress: Not on file  Social Connections: Moderately  Integrated (09/23/2023)   Social Connection and Isolation Panel    Frequency of Communication with Friends and Family: More than three times a week    Frequency of Social Gatherings with Friends and Family: More than three times a week    Attends Religious Services: More than 4 times per year    Active Member of [employer] or Organizations: No    Attends Banker Meetings: Never    Marital Status: Married  Catering manager Violence: Not At Risk (09/23/2023)   Humiliation,  Afraid, Rape, and Kick questionnaire    Fear of Current or Ex-Partner: No    Emotionally Abused: No    Physically Abused: No    Sexually Abused: No    Laboratory Investigations Lab Results  Component Value Date   TSH <0.01 (L) 11/20/2023   TSH 0.01 (L) 10/10/2023   TSH <0.010 (L) 09/25/2023   FREET4 1.6 11/20/2023   FREET4 2.2 (H) 10/10/2023   FREET4 1.37 (H) 10/04/2023     Lab Results  Component Value Date   TSI 102 10/10/2023     No components found for: TRAB   Lab Results  Component Value Date   CHOL 109 09/17/2021   Lab Results  Component Value Date   HDL 49 09/17/2021   Lab Results  Component Value Date   LDLCALC 55 09/17/2021   Lab Results  Component Value Date   TRIG 23 09/17/2021   Lab Results  Component Value Date   CHOLHDL 2.2 09/17/2021   Lab Results  Component Value Date   CREATININE 0.96 11/22/2023   No results found for: GFR    Component Value Date/Time   NA 140 11/22/2023 1404   K 4.4 11/22/2023 1404   CL 105 11/22/2023 1404   CO2 21 11/22/2023 1404   GLUCOSE 104 (H) 11/22/2023 1404   GLUCOSE 89 11/12/2023 1206   BUN 24 11/22/2023 1404   CREATININE 0.96 11/22/2023 1404   CALCIUM  9.3 11/22/2023 1404   PROT 4.9 (L) 09/26/2023 0323   ALBUMIN 2.9 (L) 09/26/2023 0323   AST 12 (L) 09/26/2023 0323   ALT 15 09/26/2023 0323   ALKPHOS 36 (L) 09/26/2023 0323   BILITOT 0.7 09/26/2023 0323   GFRNONAA >60 11/12/2023 1206   GFRAA >60 08/21/2019 1017      Latest  Ref Rng & Units 11/22/2023    2:04 PM 11/12/2023   12:06 PM 10/10/2023    9:47 AM  BMP  Glucose 70 - 99 mg/dL 895  89  897   BUN 8 - 27 mg/dL 24  23  18    Creatinine 0.76 - 1.27 mg/dL 9.03  9.35  9.36   BUN/Creat Ratio 10 - 24 25   29    Sodium 134 - 144 mmol/L 140  138  142   Potassium 3.5 - 5.2 mmol/L 4.4  4.2  4.3   Chloride 96 - 106 mmol/L 105  104  105   CO2 20 - 29 mmol/L 21  24  20    Calcium  8.6 - 10.2 mg/dL 9.3  9.3  9.3        Component Value Date/Time   WBC 6.4 10/01/2023 0500   RBC 4.64 10/01/2023 0500   HGB 14.6 10/01/2023 0500   HCT 42.9 10/01/2023 0500   PLT 185 10/01/2023 0500   MCV 92.5 10/01/2023 0500   MCH 31.5 10/01/2023 0500   MCHC 34.0 10/01/2023 0500   RDW 12.4 10/01/2023 0500   LYMPHSABS 1.4 09/18/2023 1700   MONOABS 0.5 09/18/2023 1700   EOSABS 0.0 09/18/2023 1700   BASOSABS 0.0 09/18/2023 1700      Parts of this note may have been dictated using voice recognition software. There may be variances in spelling and vocabulary which are unintentional. Not all errors are proofread. Please notify the dino if any discrepancies are noted or if the meaning of any statement is not clear.

## 2023-12-04 ENCOUNTER — Encounter: Payer: Self-pay | Admitting: Urology

## 2023-12-17 ENCOUNTER — Telehealth: Payer: Self-pay | Admitting: Cardiology

## 2023-12-17 NOTE — Telephone Encounter (Signed)
 Called patient and notified him of the following from Suzann Riddle, NP.  Yes, ok to get a flu shot   Patient verbalizes understanding.

## 2023-12-17 NOTE — Telephone Encounter (Signed)
 Pt would like to know if he is able to get the flu shot even though he is on TIKOSYN . Please advise.

## 2023-12-27 ENCOUNTER — Ambulatory Visit (HOSPITAL_COMMUNITY)
Admission: RE | Admit: 2023-12-27 | Discharge: 2023-12-27 | Disposition: A | Discharge status: Home / Self Care | Source: Ambulatory Visit | Attending: Cardiology | Admitting: Cardiology

## 2023-12-27 ENCOUNTER — Encounter (HOSPITAL_COMMUNITY): Payer: Self-pay | Admitting: Cardiology

## 2023-12-27 ENCOUNTER — Ambulatory Visit (HOSPITAL_COMMUNITY): Payer: Self-pay | Admitting: Cardiology

## 2023-12-27 VITALS — BP 118/68 | HR 63 | Wt 189.6 lb

## 2023-12-27 DIAGNOSIS — I5042 Chronic combined systolic (congestive) and diastolic (congestive) heart failure: Secondary | ICD-10-CM | POA: Diagnosis not present

## 2023-12-27 DIAGNOSIS — I4891 Unspecified atrial fibrillation: Secondary | ICD-10-CM

## 2023-12-27 DIAGNOSIS — I252 Old myocardial infarction: Secondary | ICD-10-CM | POA: Insufficient documentation

## 2023-12-27 DIAGNOSIS — E0590 Thyrotoxicosis, unspecified without thyrotoxic crisis or storm: Secondary | ICD-10-CM | POA: Insufficient documentation

## 2023-12-27 DIAGNOSIS — E785 Hyperlipidemia, unspecified: Secondary | ICD-10-CM | POA: Diagnosis not present

## 2023-12-27 DIAGNOSIS — I110 Hypertensive heart disease with heart failure: Secondary | ICD-10-CM | POA: Diagnosis not present

## 2023-12-27 DIAGNOSIS — I2510 Atherosclerotic heart disease of native coronary artery without angina pectoris: Secondary | ICD-10-CM

## 2023-12-27 DIAGNOSIS — I4819 Other persistent atrial fibrillation: Secondary | ICD-10-CM | POA: Diagnosis not present

## 2023-12-27 DIAGNOSIS — I447 Left bundle-branch block, unspecified: Secondary | ICD-10-CM | POA: Diagnosis not present

## 2023-12-27 DIAGNOSIS — I340 Nonrheumatic mitral (valve) insufficiency: Secondary | ICD-10-CM | POA: Diagnosis not present

## 2023-12-27 DIAGNOSIS — Z7901 Long term (current) use of anticoagulants: Secondary | ICD-10-CM | POA: Insufficient documentation

## 2023-12-27 DIAGNOSIS — Z955 Presence of coronary angioplasty implant and graft: Secondary | ICD-10-CM | POA: Insufficient documentation

## 2023-12-27 DIAGNOSIS — Z7982 Long term (current) use of aspirin: Secondary | ICD-10-CM | POA: Diagnosis not present

## 2023-12-27 LAB — BASIC METABOLIC PANEL WITH GFR
Anion gap: 7 (ref 5–15)
BUN: 26 mg/dL — ABNORMAL HIGH (ref 8–23)
CO2: 23 mmol/L (ref 22–32)
Calcium: 8.9 mg/dL (ref 8.9–10.3)
Chloride: 107 mmol/L (ref 98–111)
Creatinine, Ser: 0.72 mg/dL (ref 0.61–1.24)
GFR, Estimated: >60 mL/min (ref >60)
Glucose, Bld: 100 mg/dL — ABNORMAL HIGH (ref 70–99)
Potassium: 4 mmol/L (ref 3.5–5.1)
Sodium: 137 mmol/L (ref 135–145)

## 2023-12-27 LAB — ECHOCARDIOGRAM COMPLETE
Area-P 1/2: 3.03 cm2
Calc EF: 41 %
S' Lateral: 5.09 cm
Single Plane A2C EF: 40.5 %
Single Plane A4C EF: 39.6 %

## 2023-12-27 LAB — BRAIN NATRIURETIC PEPTIDE: B Natriuretic Peptide: 69.4 pg/mL (ref 0.0–100.0)

## 2023-12-27 MED ORDER — PERFLUTREN LIPID MICROSPHERE
1.0000 mL | INTRAVENOUS | Status: DC | PRN
  Administered 2023-12-27: 2 mL via INTRAVENOUS

## 2023-12-27 MED ORDER — METOPROLOL SUCCINATE ER 25 MG PO TB24: 25.0000 mg | ORAL_TABLET | Freq: Every day | ORAL | 3 refills | Status: AC

## 2023-12-27 NOTE — Progress Notes (Signed)
   ADVANCED HEART FAILURE CLINIC NOTE  Referring Physician: Darra Hamilton, PA-C  Primary Care: Darra Hamilton, PA-C Primary Cardiologist: Heart Failure: Ria Commander, DO  CC: [id number]  HPI: Tommy Rowe is a 68 y.o. 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   Pertinent Family & social hx: No significant pertinent history  Cardiac History:  - PCI to the LAD 08/2021 - Seen inpatient in 6/25 by Dr. Kennyth for atrial fibrillation with plan for outpatient cardioversion.  - Admitted to [facility] in 7/25 with cardiogenic shock 2/2 hyperthyroid induced atrial fibrillation. TTE w/ severely reduced LV function. Started on methimazole  with eventual conversion to sinus rhythm with Tikosyn  load.  - Afib/AFL ablation by Dr. Inocencio on 10/03/23   Interval hx:  - Doing very well from a functional standpoint; his only limitations are secondary to lightheadedness/dizziness. His HR today is 63;    PHYSICAL EXAM: Vitals:   12/27/23 1446  BP: 118/68  Pulse: 63  SpO2: 98%   GENERAL: NAD Lungs- normal work of breathing, CTA CARDIAC:  JVP: 7 cm          Normal rate with regular rhythm. no murmur.  Pulses 2+. No edema.  ABDOMEN: Soft, non-tender, non-distended.  EXTREMITIES: Warm and well perfused.  NEUROLOGIC: No obvious FND  DATA REVIEW  ECG: 10/10/23: sinus bradycardia with LBBB  As per my personal interpretation  ECHO: 12/27/23 09/23/23: LVEF 20-25%, moderately reduced RV function 09/19/21: LVEF 45-50%  CATH: 6/23:    Mid LAD-2 lesion is 100% stenosed.   Mid LAD-1 lesion is 70% stenosed.   2nd Diag lesion is 75% stenosed.   Dist RCA lesion is 50% stenosed.   A drug-eluting stent was successfully placed using a STENT ONYX FRONTIER 2.5X22.   Post intervention, there is a 0% residual stenosis.   There is moderate left ventricular systolic dysfunction.   LV end diastolic pressure is mildly  elevated.   The left ventricular ejection fraction is 35-45% by visual estimate.    ASSESSMENT & PLAN:  Heart Failure with reduced ejection fraction - Etiology & History: predominantly nonischemic 2/2 atrial fibrillation - NYHA Class: II - Volume status: euvolemic - GDMT:  -  RAASi: continue Entresto  24/26mg  BID.  Beta-blocker: bradycardic to 50s, decrease toprol  to 25mg .  -  SGLT2i: jardiance  10mg   - ICD: not indicated; repeat TTE at follow up - Advanced therapies: not indicated  2. Atrial fibrillation  - Difficult to control atrial fibrillation secondary to thyroid  storm  - Continue tikosyn  125mcg - s/p AF/AFL ablation  - sinus bradycardia; decrease toprol  25mg  - Apixaban  5mg  BID  3. Hyperthyroidism  - T3 is now back up to 5 on 10/10/23, T4 2.2 from 1.37.  - Methimazole  5mg  TID - Established care with endocrinology  4. CAD - s/p PCI to the LAD in 2023 - ASA + apixaban   I spent 35 minutes caring for this patient today including face to face time, ordering and reviewing labs, reviewing echocardiogram with , seeing the patient, documenting in the record, and arranging follow ups.    Json Koelzer Advanced Heart Failure Mechanical Circulatory Support

## 2023-12-27 NOTE — Patient Instructions (Addendum)
 Medication Changes:  Decrease Metoprolol  XL to 25 mg Daily  Lab Work:  Labs done today, your results will be available in MyChart, we will contact you for abnormal readings.   Special Instructions // Education:  Do the following things EVERYDAY: Weigh yourself in the morning before breakfast. Write it down and keep it in a log. Take your medicines as prescribed Eat low salt foods--Limit salt (sodium) to 2000 mg per day.  Stay as active as you can everyday Limit all fluids for the day to less than 2 liters   Follow-Up in: 3 months   At the [facility], you and your health needs are our priority. We have a designated team specialized in the treatment of Heart Failure. This Care Team includes your primary Heart Failure Specialized Cardiologist (physician), Advanced Practice Providers (APPs- Physician Assistants and Nurse Practitioners), and Pharmacist who all work together to provide you with the care you need, when you need it.   You may see any of the following providers on your designated Care Team at your next follow up:  Dr. Toribio Fuel Dr. Ezra Shuck Dr. Ria Commander Dr. Odis Brownie Greig Mosses, NP Caffie Shed, [place] [facility] [place] [facility] [place], [place] Beckey Coe, NP [place] Lee, NP Tinnie Redman, PharmD   Please be sure to bring in all your medications bottles to every appointment.   Need to Contact Us :  If you have any questions or concerns before your next appointment please send us  a message through [place] or call our office at [phone].    TO LEAVE A MESSAGE FOR THE NURSE SELECT OPTION 2, PLEASE LEAVE A MESSAGE INCLUDING: YOUR NAME DATE OF BIRTH CALL BACK NUMBER REASON FOR CALL**this is important as we prioritize the call backs  YOU WILL RECEIVE A CALL BACK THE SAME DAY AS LONG AS YOU CALL BEFORE 4:00 PM

## 2023-12-31 ENCOUNTER — Other Ambulatory Visit: Payer: Self-pay | Admitting: Cardiology

## 2023-12-31 NOTE — Telephone Encounter (Signed)
*  STAT* If patient is at the pharmacy, call can be transferred to refill team.   1. Which medications need to be refilled? (please list name of each medication and dose if known)   dofetilide  (TIKOSYN ) 125 MCG capsule     4. Which pharmacy/location (including street and city if local pharmacy) is medication to be sent to?  [id number] - [place], [place] - [address]     5. Do they need a 30 day or 90 day supply? 90

## 2023-12-31 NOTE — Telephone Encounter (Signed)
 Pt of Dr. Inocencio. This RX was last refilled by [facility]-Fib clinic. Is this something that Dr. Inocencio wants to refill? Please advise.

## 2024-01-01 ENCOUNTER — Telehealth: Payer: Self-pay | Admitting: Cardiology

## 2024-01-01 MED ORDER — DOFETILIDE 125 MCG PO CAPS: 125.0000 ug | ORAL_CAPSULE | Freq: Two times a day (BID) | ORAL | 1 refills | Status: DC

## 2024-01-01 NOTE — Telephone Encounter (Signed)
 Refill sent to pharmacy.

## 2024-01-01 NOTE — Telephone Encounter (Signed)
 Tikoysn managed by afib clinic  Will route to afib clinic to ensure correct dosage is refilled

## 2024-01-01 NOTE — Telephone Encounter (Signed)
 Patient called to follow-up on getting his dofetilide  (TIKOSYN ) 125 MCG capsule filled to [id number] - [place], [place] - [address]  Patient noted he only has 2 capsules left and will be out of the medication tomorrow.

## 2024-01-01 NOTE — Telephone Encounter (Signed)
*  STAT* If patient is at the pharmacy, call can be transferred to refill team.   1. Which medications need to be refilled? (please list name of each medication and dose if known)   dofetilide  (TIKOSYN ) 125 MCG capsule     2. Would you like to learn more about the convenience, safety, & potential cost savings by using the [facility]? No    3. Are you open to using the [facility] (Type [facility]. No    4. Which pharmacy/location (including street and city if local pharmacy) is medication to be sent to? [id number] - [place], [place] - [address]     5. Do they need a 30 day or 90 day supply? 90 day

## 2024-01-01 NOTE — Telephone Encounter (Signed)
 This is a CHF pt

## 2024-01-03 ENCOUNTER — Other Ambulatory Visit (HOSPITAL_COMMUNITY): Payer: Self-pay | Admitting: *Deleted

## 2024-01-03 MED ORDER — DOFETILIDE 125 MCG PO CAPS: 125.0000 ug | ORAL_CAPSULE | Freq: Two times a day (BID) | ORAL | 3 refills | Status: DC

## 2024-01-09 ENCOUNTER — Encounter: Payer: Self-pay | Admitting: Cardiology

## 2024-01-09 ENCOUNTER — Ambulatory Visit: Attending: Cardiology | Admitting: Cardiology

## 2024-01-09 VITALS — BP 131/77 | HR 63 | Ht 72.0 in | Wt 189.0 lb

## 2024-01-09 DIAGNOSIS — I4819 Other persistent atrial fibrillation: Secondary | ICD-10-CM | POA: Insufficient documentation

## 2024-01-09 DIAGNOSIS — I5022 Chronic systolic (congestive) heart failure: Secondary | ICD-10-CM | POA: Diagnosis present

## 2024-01-09 DIAGNOSIS — Z5181 Encounter for therapeutic drug level monitoring: Secondary | ICD-10-CM | POA: Insufficient documentation

## 2024-01-09 DIAGNOSIS — Z79899 Other long term (current) drug therapy: Secondary | ICD-10-CM | POA: Diagnosis present

## 2024-01-09 DIAGNOSIS — D6869 Other thrombophilia: Secondary | ICD-10-CM | POA: Insufficient documentation

## 2024-01-09 NOTE — Progress Notes (Signed)
  Electrophysiology Office Note:   Date:  01/09/2024  ID:  Tommy Rowe, DOB 1955/08/24, MRN [id number]  Primary Cardiologist: Cara JONETTA Lovelace, MD Primary Heart Failure: Ria Commander, DO Electrophysiologist: Soyla Gladis Norton, MD      History of Present Illness:   Tommy Rowe is a 68 y.o. male with h/o atrial fibrillation/flutter, coronary artery disease post LAD stent, hypertension, hyperlipidemia, left bundle branch block, hypothyroidism seen today for routine electrophysiology follow-up s/p Ablation.  Since last being seen in our clinic the patient reports doing well.  He has no chest pain or shortness of breath.  He is able to do his daily activities.  He feels significantly improved since his ablation.  His repeat echo does show a continued reduction in his ejection fraction.  He has plans for catheterization upcoming..  he denies chest pain, palpitations, dyspnea, PND, orthopnea, nausea, vomiting, dizziness, syncope, edema, weight gain, or early satiety.    Review of systems complete and found to be negative unless listed in HPI.   EP Information / Studies Reviewed:    EKG is ordered today. Personal review as below.  EKG Interpretation Date/Time:  Thursday January 09 2024 11:59:59 EDT Ventricular Rate:  63 PR Interval:  150 QRS Duration:  142 QT Interval:  464 QTC Calculation: 474 R Axis:   80  Text Interpretation: Normal sinus rhythm Left bundle branch block When compared with ECG of 27-Dec-2023 14:40, No significant change was found Confirmed by Desarea Ohagan ([id number]) on 01/09/2024 12:07:44 PM     Risk Assessment/Calculations:    CHA2DS2-VASc Score = 4   This indicates a 4.8% annual risk of stroke. The patient's score is based upon: CHF History: 1 HTN History: 1 Diabetes History: 0 Stroke History: 0 Vascular Disease History: 1 Age Score: 1 Gender Score: 0            Physical Exam:   VS:  BP 131/77 (BP Location: Left Arm, Patient Position: Sitting,  Cuff Size: Normal)   Pulse 63   Ht 6' (1.829 m)   Wt 189 lb (85.7 kg)   SpO2 97%   BMI 25.63 kg/m    Wt Readings from Last 3 Encounters:  01/09/24 189 lb (85.7 kg)  12/27/23 189 lb 9.6 oz (86 kg)  11/25/23 190 lb (86.2 kg)     GEN: Well nourished, well developed in no acute distress NECK: No JVD; No carotid bruits CARDIAC: Regular rate and rhythm, no murmurs, rubs, gallops RESPIRATORY:  Clear to auscultation without rales, wheezing or rhonchi  ABDOMEN: Soft, non-tender, non-distended EXTREMITIES:  No edema; No deformity   ASSESSMENT AND PLAN:    1.  Persistent atrial fibrillation/flutter: Exacerbated by thyroid  storm.  Post ablation 10/03/2023.  Remains in sinus rhythm.  Continue dofetilide .  2.  Secondary hypercoagulable state: On Eliquis   3.  Hypothyroidism: Plan per primary physician.  4.  Chronic systolic heart failure: Exacerbated by hyperthyroid storm and atrial fibrillation.  Now post ablation.  His ejection fraction remains reduced.  He is planning a left heart catheterization.  If no further management is planned, may require ICD.  5.  High risk medication monitoring: QTc remained stable on dofetilide .  Follow up with Afib Clinic in 6 months  Signed, Nehemiah Mcfarren Gladis Norton, MD

## 2024-01-09 NOTE — Patient Instructions (Signed)
 Medication Instructions:  - Your physician recommends that you continue on your current medications as directed. Please refer to the Current Medication list given to you today.  *If you need a refill on your cardiac medications before your next appointment, please call your pharmacy*  Lab Work: - none ordered  If you have labs (blood work) drawn today and your tests are completely normal, you will receive your results only by: MyChart Message (if you have MyChart) OR A paper copy in the mail If you have any lab test that is abnormal or we need to change your treatment, we will call you to review the results.  Testing/Procedures: - none ordered  Follow-Up: At [facility], you and your health needs are our priority.  As part of our continuing mission to provide you with exceptional heart care, our providers are all part of one team.  This team includes your primary Cardiologist (physician) and Advanced Practice Providers or APPs (Physician Assistants and Nurse Practitioners) who all work together to provide you with the care you need, when you need it.  Your next appointment:   6 month(s)  Provider:   You will follow up in the Atrial Fibrillation Clinic located at [facility].   We recommend signing up for the patient portal called MyChart.  Sign up information is provided on this After Visit Summary.  MyChart is used to connect with patients for Virtual Visits (Telemedicine).  Patients are able to view lab/test results, encounter notes, upcoming appointments, etc.  Non-urgent messages can be sent to your provider as well.   To learn more about what you can do with MyChart, go to [email or web address].   Other Instructions N/A

## 2024-01-16 ENCOUNTER — Telehealth: Payer: Self-pay | Admitting: Emergency Medicine

## 2024-01-16 ENCOUNTER — Encounter: Payer: Self-pay | Admitting: Physician Assistant

## 2024-01-16 ENCOUNTER — Ambulatory Visit: Attending: Physician Assistant | Admitting: Physician Assistant

## 2024-01-16 VITALS — BP 118/60 | HR 53 | Ht 72.0 in | Wt 192.0 lb

## 2024-01-16 DIAGNOSIS — I340 Nonrheumatic mitral (valve) insufficiency: Secondary | ICD-10-CM | POA: Diagnosis present

## 2024-01-16 DIAGNOSIS — I5020 Unspecified systolic (congestive) heart failure: Secondary | ICD-10-CM | POA: Diagnosis present

## 2024-01-16 DIAGNOSIS — I2510 Atherosclerotic heart disease of native coronary artery without angina pectoris: Secondary | ICD-10-CM | POA: Diagnosis present

## 2024-01-16 DIAGNOSIS — I4819 Other persistent atrial fibrillation: Secondary | ICD-10-CM | POA: Diagnosis present

## 2024-01-16 DIAGNOSIS — G729 Myopathy, unspecified: Secondary | ICD-10-CM | POA: Insufficient documentation

## 2024-01-16 DIAGNOSIS — E785 Hyperlipidemia, unspecified: Secondary | ICD-10-CM | POA: Diagnosis present

## 2024-01-16 DIAGNOSIS — Z79899 Other long term (current) drug therapy: Secondary | ICD-10-CM | POA: Insufficient documentation

## 2024-01-16 DIAGNOSIS — I447 Left bundle-branch block, unspecified: Secondary | ICD-10-CM | POA: Insufficient documentation

## 2024-01-16 DIAGNOSIS — I429 Cardiomyopathy, unspecified: Secondary | ICD-10-CM | POA: Diagnosis present

## 2024-01-16 DIAGNOSIS — I10 Essential (primary) hypertension: Secondary | ICD-10-CM | POA: Insufficient documentation

## 2024-01-16 NOTE — Telephone Encounter (Signed)
-----   Message from Nurse Arlean CROME sent at 01/16/2024  3:09 PM EDT ----- Regarding: Cardiac Cath 01/20/24 Tommy Rowe,  This patient should be instructed to hold Jardiance  the morning of the cath.  You should also ask him his usual schedule to take Tikosyn  and make sure that he does not miss taking his Tikosyn  on schedule. If he would be due to take a dose of Tikosyn  during the time he is at the hospital for the cath,  he can take it with him, let hospital staff know he has it and take it at the hospital.  Thanks, Arlean

## 2024-01-16 NOTE — Telephone Encounter (Signed)
 Pt called ant the following discussed:  Hi Tommy Rowe,   This patient should be instructed to hold Jardiance  the morning of the cath.   You should also ask him his usual schedule to take Tikosyn  and make sure that he does not miss taking his Tikosyn  on schedule.  If he would be due to take a dose of Tikosyn  during the time he is at the hospital for the cath,  he can take it with him, let hospital staff know he has it and take it at the hospital.  Pt remind to call or message with any questions

## 2024-01-16 NOTE — H&P (View-Only) (Signed)
 Cardiology Office Note    Date:  01/16/2024   ID:  Tommy Rowe, DOB 1955-11-14, MRN [id number]  PCP:  Darra Hamilton, PA-C  Cardiologist:  Cara JONETTA Lovelace, MD  Electrophysiologist:  Soyla Gladis Norton, MD   Chief Complaint: Cardiac cath requested by advanced heart failure  History of Present Illness:   Tommy Rowe is a 68 y.o. male with history of CAD status post PCI to the LAD in 2023, HFrEF, persistent A-fib status post multiple unsuccessful DCCV status post ablation in 10/2023 on Tikosyn  and apixaban , LBBB, HTN, HLD, lumbar radiculopathy, osteoarthritis, hypothyroidism who presents for evaluation of diagnostic cardiac cath at the request of advanced heart failure.  Patient was previously followed by Drs. Fernand with [facility] and Dr. Lovelace with [facility].  Prior echo in 08/2019 showed an EF of 60 to 65%, no regional wall motion abnormalities, severe concentric LVH, grade 1 diastolic dysfunction, normal RV systolic function and ventricular cavity size, normal RVSP, trivial mitral regurgitation, and an estimated right atrial pressure of 3 mmHg.  LHC in 04/2021 showed mid LAD 40% stenosis with normal LV systolic function and LVEDP.  Tommy Rowe was admitted to the hospital in 08/2021 with an NSTEMI.  LHC showed an occluded mid LAD with a second lesion along the mid LAD estimated at 70%, 75% stenosis of D2, and 50% stenosis of the distal RCA.  Tommy Rowe underwent successful PCI/DES to the LAD.  LVEF of 35 to 45% by LV gram with anterior apical akinesis.  Post-procedure echo showed an EF of 45 to 50%, hypokinesis of the mid apical anterior wall, mild LVH, grade 1 diastolic dysfunction, normal RV systolic function and ventricular cavity size, and no significant valvular abnormality.  Hospital course was also notable for A-fib with RVR, for which she was placed on anticoagulation and amiodarone .    Tommy Rowe was admitted to the hospital in 09/2023 with recurrent A-fib with unsuccessful DCCV x 2 by the  ED leading to subsequent admission and reinitiation of amiodarone  with subsequent conversion to sinus rhythm.  However, Tommy Rowe was found to have a new diagnosis of hyperthyroidism leading to the discontinuation of amiodarone .  Tommy Rowe was readmitted several days later with recurrent A-fib with RVR and was again started on amiodarone  with conversion to sinus rhythm followed by discontinuation of amiodarone  thereafter with initiation of Multaq .  Tommy Rowe was admitted a third time in 09/2023 with recurrent A-fib with reinitiation of amiodarone  and plans for ablation at [facility] in 10/2023.  Tommy Rowe was admitted for a fourth time in 09/2023 with recurrent A-fib with RVR.  During that admission Tommy Rowe was evaluated by EP and underwent ablation and was loaded with Tikosyn .  Admission was also notable for new cardiomyopathy with an EF of 20 to 25%, global hypokinesis, severely dilated LV internal cavity size, moderately reduced RV systolic function with mildly enlarged ventricular cavity size and mildly elevated RVSP estimated at 42.2 mmHg, moderate to severe mitral regurgitation, mild mitral stenosis with restrictive posterior leaflet and a central MR jet, mild calcification of the aortic valve with trivial insufficiency and no evidence of stenosis.  Following A-fib ablation Tommy Rowe has not had readmission for A-fib.  Tommy Rowe has been managed in the outpatient setting by EP, A-fib clinic, and advanced heart failure.  Most recent echo in 12/2023, maintaining sinus rhythm, showed a persistent cardiomyopathy with an EF of 25 to 30%, global hypokinesis with apical septal and apical aneurysm, mild LVH of the basal septal segment, grade 1 diastolic dysfunction, normal RV  systolic function and ventricular cavity size, normal RVSP, severely dilated left atrium, mild mitral regurgitation, no evidence of mitral stenosis, and trivial aortic insufficiency.  In the setting of persistent cardiomyopathy, advanced heart failure recommended cardiac cath.  Tommy Rowe comes in  accompanied by his wife today and is doing well from a cardiac perspective, currently without symptoms of angina or cardiac decompensation.  Tommy Rowe reports approximately 1 month ago Tommy Rowe did have some right shoulder and upper arm discomfort similar to what Tommy Rowe experienced at the time of his MI in 2023, though discomfort only lasted several minutes and spontaneously resolved.  Tommy Rowe has been without symptoms since.  Tommy Rowe does continue to note some dizziness and fatigue.  No near-syncope or syncope.  No lower extremity swelling or progressive orthopnea.  No early satiety.  Adherent and tolerating cardiac pharmacotherapy without off target effect.  No falls, hematochezia, or melena.   Labs independently reviewed: 12/2023 - BNP 69, potassium 4.0, BUN 26, serum creatinine 0.72 11/2023 - magnesium  2.1, TSH less than 0.01, free T4 normal, free T3 elevated at 4.5 10/2023 - Hgb 14.6, PLT 185 09/2023 - A1c 5.0, albumin 2.9, AST/ALT not elevated 07/2023 - TC 118, TG 44, HDL 49, LDL 60 08/2021 - LP(a) 114  Past Medical History:  Diagnosis Date   A-fib (HCC)    Anxiety    HLD (hyperlipidemia)    HTN (hypertension)     Past Surgical History:  Procedure Laterality Date   A-FLUTTER ABLATION N/A 10/03/2023   Procedure: A-FLUTTER ABLATION;  Surgeon: Inocencio Soyla Lunger, MD;  Location: [facility] IN[facility];  Service: Cardiovascular;  Laterality: N/A;   ATRIAL FIBRILLATION ABLATION N/A 10/03/2023   Procedure: ATRIAL FIBRILLATION ABLATION;  Surgeon: Inocencio Soyla Lunger, MD;  Location: [facility] INVASIVE CV LAB;  Service: Cardiovascular;  Laterality: N/A;   CORONARY STENT INTERVENTION N/A 09/18/2021   Procedure: CORONARY STENT INTERVENTION;  Surgeon: Florencio Cara BIRCH, MD;  Location: [facility] INVASIVE CV LAB;  Service: Cardiovascular;  Laterality: N/A;   FACIAL FRACTURE SURGERY  1980   LEFT HEART CATH AND CORONARY ANGIOGRAPHY Left 04/11/2021   Procedure: LEFT HEART CATH AND CORONARY ANGIOGRAPHY;  Surgeon: Fernand Denyse LABOR, MD;  Location: [facility]  INVASIVE CV LAB;  Service: Cardiovascular;  Laterality: Left;   LEFT HEART CATH AND CORONARY ANGIOGRAPHY N/A 09/18/2021   Procedure: LEFT HEART CATH AND CORONARY ANGIOGRAPHY;  Surgeon: Florencio Cara BIRCH, MD;  Location: [facility] INVASIVE CV LAB;  Service: Cardiovascular;  Laterality: N/A;   LUMBAR LAMINECTOMY/DECOMPRESSION MICRODISCECTOMY N/A 08/26/2019   Procedure: RIGHT L4-5 MICRODISCECTOMY;  Surgeon: Clois Fret, MD;  Location: [facility] ORS;  Service: Neurosurgery;  Laterality: N/A;    Current Medications: No outpatient medications have been marked as taking for the 01/16/24 encounter (Office Visit) with Abigail Bernardino HERO, PA-C.    Allergies:   Amiodarone    Social History   Socioeconomic History   Marital status: Married    Spouse name: Not on file   Number of children: Not on file   Years of education: Not on file   Highest education level: Not on file  Occupational History   Not on file  Tobacco Use   Smoking status: Former    Current packs/day: 0.00    Types: Cigarettes    Quit date: 04/02/1988    Years since quitting: 35.8    Passive exposure: Past   Smokeless tobacco: Never  Vaping Use   Vaping status: Not on file  Substance and Sexual Activity   Alcohol use: No   Drug  use: Never   Sexual activity: Not on file  Other Topics Concern   Not on file  Social History Narrative   Not on file   Social Drivers of Health   Financial Resource Strain: Not on file  Food Insecurity: No Food Insecurity (09/23/2023)   Hunger Vital Sign    Worried About Running Out of Food in the Last Year: Never true    Ran Out of Food in the Last Year: Never true  Transportation Needs: No Transportation Needs (09/23/2023)   PRAPARE - Administrator, Civil Service (Medical): No    Lack of Transportation (Non-Medical): No  Physical Activity: Not on file  Stress: Not on file  Social Connections: Moderately Integrated (09/23/2023)   Social Connection and Isolation Panel    Frequency of  Communication with Friends and Family: More than three times a week    Frequency of Social Gatherings with Friends and Family: More than three times a week    Attends Religious Services: More than 4 times per year    Active Member of [employer] or Organizations: No    Attends Banker Meetings: Never    Marital Status: Married     Family History:  The patient's family history includes COPD in his mother; Heart attack in his father; Valvular heart disease in his sister.  ROS:   12-point review of systems is negative unless otherwise noted in the HPI.   EKGs/Labs/Other Studies Reviewed:    Studies reviewed were summarized above. The additional studies were reviewed today:  2D echo 12/27/2023: 1. Global hypokinesis with apical septal and apical aneurysm. Left  ventricular ejection fraction, by estimation, is 25 to 30%. The left  ventricle has severely decreased function. The left ventricle demonstrates  regional wall motion abnormalities (see  scoring diagram/findings for description). There is mild left ventricular  hypertrophy of the basal-septal segment. Left ventricular diastolic  parameters are consistent with Grade I diastolic dysfunction (impaired  relaxation).   2. Right ventricular systolic function is normal. The right ventricular  size is normal. There is normal pulmonary artery systolic pressure.   3. Left atrial size was severely dilated.   4. The mitral valve is normal in structure. Mild mitral valve  regurgitation. No evidence of mitral stenosis.   5. The aortic valve is tricuspid. Aortic valve regurgitation is trivial.  No aortic stenosis is present.   6. The inferior vena cava is normal in size with greater than 50%  respiratory variability, suggesting right atrial pressure of 3 mmHg.  __________  TEE 09/24/2023: 1. Left ventricular ejection fraction, by estimation, is <20%. The left  ventricle has severely decreased function.   2. Right ventricular  systolic function is severely reduced. The right  ventricular size is normal.   3. Left atrial size was mildly dilated. No left atrial/left atrial  appendage thrombus was detected.   4. The mitral valve is normal in structure. Mild mitral valve  regurgitation.   5. The aortic valve is normal in structure. Aortic valve regurgitation is  not visualized.  __________  2D echo 09/23/2023: 1. Left ventricular ejection fraction, by estimation, is 20 to 25%. The  left ventricle has severely decreased function. The left ventricle  demonstrates global hypokinesis. The left ventricular internal cavity size  was moderately to severely dilated.  Left ventricular diastolic parameters are indeterminate.   2. Right ventricular systolic function is moderately reduced. The right  ventricular size is mildly enlarged. There is mildly elevated pulmonary  artery systolic pressure. The estimated right ventricular systolic  pressure is 42.2 mmHg.   3. The mitral valve is degenerative. Moderate to severe mitral valve  regurgitation. Mild mitral stenosis. Restrictive posterior leafleft,  central MR jet. Recommend repeat echo when better compensated.   4. The aortic valve is tricuspid. There is mild calcification of the  aortic valve. Aortic valve regurgitation is trivial. Aortic valve  sclerosis is present, with no evidence of aortic valve stenosis.   5. The inferior vena cava is dilated in size with <50% respiratory  variability, suggesting right atrial pressure of 15 mmHg.  __________  2D echo 09/19/2021: 1. Left ventricular ejection fraction, by estimation, is 45 to 50%. The  left ventricle has mildly decreased function. The left ventricle  demonstrates regional wall motion abnormalities (see scoring  diagram/findings for description). There is mild left  ventricular hypertrophy. Left ventricular diastolic parameters are  consistent with Grade I diastolic dysfunction (impaired relaxation). There  is  hypokinesis of the left ventricular, mid-apical anterior wall.   2. Right ventricular systolic function is normal. The right ventricular  size is normal.   3. The mitral valve is normal in structure. No evidence of mitral valve  regurgitation. No evidence of mitral stenosis.   4. The aortic valve is normal in structure. Aortic valve regurgitation is  not visualized. No aortic stenosis is present.  __________  [facility] 09/18/2021 Avelina):   Mid LAD-2 lesion is 100% stenosed.   Mid LAD-1 lesion is 70% stenosed.   2nd Diag lesion is 75% stenosed.   Dist RCA lesion is 50% stenosed.   A drug-eluting stent was successfully placed using a STENT ONYX FRONTIER 2.5X22.   Post intervention, there is a 0% residual stenosis.   There is moderate left ventricular systolic dysfunction.   LV end diastolic pressure is mildly elevated.   The left ventricular ejection fraction is 35-45% by visual estimate.   Conclusion Moderately reduced left ventricular function with anterior apical akinesis EF around 35 to 40%   Coronary Left main large relatively free of disease LAD large with a 75% proximal 100% mid TIMI 0 flow Diagonal 2 had a 80% proximal lesion TIMI-3 flow Circumflex large minor irregularities RCA with large minor irregularities with a 50% distal lesion TIMI-3 flow   Intervention Successful PCI and stent of mid LAD with DES stent Onyx frontier 2.5 x 22 mm stent was deployed Postdilated with a  trek Neo at 14 atm Lesion was reduced from 100% down to 0% and TIMI-3 flow was restored from TIMI 0 Minx was deployed in the right groin Patient tolerated procedure well No complications __________  [facility] 04/11/2021 ([facility]):   Mid LAD lesion is 40% stenosed.   The left ventricular systolic function is normal.   LV end diastolic pressure is normal.   There is no aortic valve stenosis.   Mild mid LAD disease, treat medically. __________  2D echo 08/27/2019 ([facility]): 1. Left ventricular  ejection fraction, by estimation, is 60 to 65%. The  left ventricle has normal function. The left ventricle has no regional  wall motion abnormalities. There is severe concentric left ventricular  hypertrophy. Left ventricular diastolic   parameters are consistent with Grade I diastolic dysfunction (impaired  relaxation).   2. Right ventricular systolic function is normal. The right ventricular  size is normal. There is normal pulmonary artery systolic pressure.   3. The mitral valve is normal in structure. Trivial mitral valve  regurgitation. No evidence of mitral stenosis.  4. The aortic valve is normal in structure. Aortic valve regurgitation is  not visualized. No aortic stenosis is present.   5. The inferior vena cava is normal in size with greater than 50%  respiratory variability, suggesting right atrial pressure of 3 mmHg.     EKG:  EKG is ordered today.  The EKG ordered today demonstrates sinus bradycardia, 53 bpm, LBBB (known and stable)  Recent Labs: 09/26/2023: ALT 15 10/01/2023: Hemoglobin 14.6; Platelets 185 11/20/2023: TSH <0.01 11/22/2023: Magnesium  2.1 12/27/2023: B Natriuretic Peptide 69.4; BUN 26; Creatinine, Ser 0.72; Potassium 4.0; Sodium 137  Recent Lipid Panel    Component Value Date/Time   CHOL 109 09/17/2021 0907   TRIG 23 09/17/2021 0907   HDL 49 09/17/2021 0907   CHOLHDL 2.2 09/17/2021 0907   VLDL 5 09/17/2021 0907   LDLCALC 55 09/17/2021 0907    PHYSICAL EXAM:    VS:  BP 118/60 (BP Location: Left Arm, Patient Position: Sitting)   Pulse (!) 53   Ht 6' (1.829 m)   Wt 192 lb (87.1 kg)   SpO2 98%   BMI 26.04 kg/m   BMI: Body mass index is 26.04 kg/m.  Physical Exam Vitals reviewed.  Constitutional:      Appearance: Tommy Rowe is well-developed.  HENT:     Head: Normocephalic and atraumatic.  Eyes:     General:        Right eye: No discharge.        Left eye: No discharge.  Cardiovascular:     Rate and Rhythm: Regular rhythm. Bradycardia present.      Pulses:          Posterior tibial pulses are 2+ on the right side and 2+ on the left side.     Heart sounds: Normal heart sounds, S1 normal and S2 normal. Heart sounds not distant. No midsystolic click and no opening snap. No murmur heard.    No friction rub.  Pulmonary:     Effort: Pulmonary effort is normal. No respiratory distress.     Breath sounds: Normal breath sounds. No decreased breath sounds, wheezing, rhonchi or rales.  Musculoskeletal:     Cervical back: Normal range of motion.     Right lower leg: No edema.     Left lower leg: No edema.  Skin:    General: Skin is warm and dry.     Nails: There is no clubbing.  Neurological:     Mental Status: Tommy Rowe is alert and oriented to person, place, and time.  Psychiatric:        Speech: Speech normal.        Behavior: Behavior normal.        Thought Content: Thought content normal.        Judgment: Judgment normal.     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  Continue current GDMT including Entresto  24/26 mg twice daily, empagliflozin  10 mg, and metoprolol  succinate 25 mg.  Not requiring standing loop diuretic.  Spironolactone  previously discontinued due to relative hypotension.  Consider rechallenge of MRA post cath, deferred today with relative hypotension, dizziness, and fatigue.  Schedule R/LHC given persistent cardiomyopathy despite restoration of sinus rhythm.  Apixaban  will need to  be held 2 days prior to cardiac cath with recommendation to resume as soon as possible postprocedure to minimize interruption and reduce CVA risk.  Anticipate cardiac MRI post cath pending results.  Ongoing management per advanced heart failure.  CAD involving native coronary arteries without angina: Tommy Rowe is  without symptoms of angina or cardiac decompensation.  Tommy Rowe is on apixaban  in lieu of aspirin .  Planning for diagnostic R/LHC as outlined above to further evaluate his cardiomyopathy.  Continue rosuvastatin  20 mg.  Persistent A-fib: Maintaining sinus rhythm with a mildly bradycardic rate on Toprol -XL 25 mg daily and dofetilide  125 mcg twice daily.  Maintain potassium and magnesium  at goal 4.0 and 2.0 respectively.  CHA2DS2-VASc at least 4 (CHF, HTN, age x 1, vascular disease).  Remains on apixaban  5 mg twice daily.  No falls or symptoms concerning for bleeding.  Check CBC, BMP, and magnesium .  Mitral regurgitation: Mild by echo.  No murmur noted on exam.  HTN: Blood pressure is well-controlled in the office today.  Continue pharmacotherapy as outlined above.  HLD: LDL 60 in 07/2023.  Target LDL at least less than 70.  Remains on rosuvastatin  20 mg.  Hyperthyroidism: Most recent TSH remains suppressed with elevated free T3.  Remains on methimazole .  Avoid future amiodarone  use.  Followed by endocrinology.  High risk medication use: Remains on dofetilide .  EKG demonstrates known LBBB with stable QTc.  Most recent magnesium  and potassium from 11/2023 and 12/2023 at goal 2.0 and 4.0, respectively.  Ongoing management and monitoring per EP.   Informed Consent   Shared Decision Making/Informed Consent{  The risks [stroke (1 in 1000), death (1 in 1000), kidney failure [usually temporary] (1 in 500), bleeding (1 in 200), allergic reaction [possibly serious] (1 in 200)], benefits (diagnostic support and management of coronary artery disease) and alternatives of a cardiac catheterization were discussed in detail with Tommy Rowe and Tommy Rowe is willing to proceed.        Disposition: F/u with me 1-2 weeks after [facility], as well as advanced heart failure and EP as directed.   Medication Adjustments/Labs and Tests Ordered: Current medicines are reviewed at length with the patient today.  Concerns regarding medicines  are outlined above. Medication changes, Labs and Tests ordered today are summarized above and listed in the Patient Instructions accessible in Encounters.   Signed, Bernardino Bring, PA-C 01/16/2024 3:49 PM      [facility] [address] [place], [place] [address] [phone]

## 2024-01-16 NOTE — Patient Instructions (Signed)
 Medication Instructions:  Your physician recommends that you continue on your current medications as directed. Please refer to the Current Medication list given to you today.   *If you need a refill on your cardiac medications before your next appointment, please call your pharmacy*  Lab Work: Your provider would like for you to have following labs drawn today BMeT, CBC and Mag.   If you have labs (blood work) drawn today and your tests are completely normal, you will receive your results only by: MyChart Message (if you have MyChart) OR A paper copy in the mail If you have any lab test that is abnormal or we need to change your treatment, we will call you to review the results.  Testing/Procedures:  [facility] [facility] DEPT OF [facility] [place] [address] [place] [address] Dept: [phone] Loc: [phone]  ISAIS KLIPFEL  01/16/2024  You are scheduled for a Cardiac Catheterization on Monday, October 20 with Dr. Lonni End.  1. Please arrive at the [facility] (Main Entrance A) at [facility]: [address] [place], [place] [address] at 8:30 AM (This time is 2 hour(s) before your procedure to ensure your preparation).   Free valet parking service is available. You will check in at ADMITTING. The support person will be asked to wait in the waiting room.  It is OK to have someone drop you off and come back when you are ready to be discharged.    Special note: Every effort is made to have your procedure done on time. Please understand that emergencies sometimes delay scheduled procedures.  2. Diet: Nothing to eat after midnight.   3. Hydration: You need to be well hydrated before your procedure. On October 20, you may drink approved liquids (see below) until 2 hours before the procedure, with 16 oz of water as your last intake.   List of approved liquids water, clear juice, clear tea,  black coffee, fruit juices, non-citric and without pulp, carbonated beverages, Gatorade, Kool -Aid, plain Jello-O and plain ice popsicles.  4. Labs: You will need to have blood drawn today.  5. Medication instructions in preparation for your procedure:   Contrast Allergy: No  Stop taking Eliquis  (Apixiban) on Friday, October 17.  On the morning of your procedure, take an Aspirin  81 mg and any morning medicines NOT listed above.  You may use sips of water.  6. Plan to go home the same day, you will only stay overnight if medically necessary. 7. Bring a current list of your medications and current insurance cards. 8. You MUST have a responsible person to drive you home. 9. Someone MUST be with you the first 24 hours after you arrive home or your discharge will be delayed. 10. Please wear clothes that are easy to get on and off and wear slip-on shoes.  Thank you for allowing us  to care for you!   -- [facility]   Follow-Up: At [facility], you and your health needs are our priority.  As part of our continuing mission to provide you with exceptional heart care, our providers are all part of one team.  This team includes your primary Cardiologist (physician) and Advanced Practice Providers or APPs (Physician Assistants and Nurse Practitioners) who all work together to provide you with the care you need, when you need it.  Your next appointment:   1-2 week(s) after cath on 20  October  Provider:   You may see Cara JONETTA Lovelace, MD or Bernardino Bring, PA-C

## 2024-01-16 NOTE — Progress Notes (Signed)
 Cardiology Office Note    Date:  01/16/2024   ID:  Tommy Rowe, DOB 1955-11-14, MRN [id number]  PCP:  Darra Hamilton, PA-C  Cardiologist:  Cara JONETTA Lovelace, MD  Electrophysiologist:  Soyla Gladis Norton, MD   Chief Complaint: Cardiac cath requested by advanced heart failure  History of Present Illness:   Tommy Rowe is a 68 y.o. male with history of CAD status post PCI to the LAD in 2023, HFrEF, persistent A-fib status post multiple unsuccessful DCCV status post ablation in 10/2023 on Tikosyn  and apixaban , LBBB, HTN, HLD, lumbar radiculopathy, osteoarthritis, hypothyroidism who presents for evaluation of diagnostic cardiac cath at the request of advanced heart failure.  Patient was previously followed by Drs. Fernand with [facility] and Dr. Lovelace with [facility].  Prior echo in 08/2019 showed an EF of 60 to 65%, no regional wall motion abnormalities, severe concentric LVH, grade 1 diastolic dysfunction, normal RV systolic function and ventricular cavity size, normal RVSP, trivial mitral regurgitation, and an estimated right atrial pressure of 3 mmHg.  LHC in 04/2021 showed mid LAD 40% stenosis with normal LV systolic function and LVEDP.  He was admitted to the hospital in 08/2021 with an NSTEMI.  LHC showed an occluded mid LAD with a second lesion along the mid LAD estimated at 70%, 75% stenosis of D2, and 50% stenosis of the distal RCA.  He underwent successful PCI/DES to the LAD.  LVEF of 35 to 45% by LV gram with anterior apical akinesis.  Post-procedure echo showed an EF of 45 to 50%, hypokinesis of the mid apical anterior wall, mild LVH, grade 1 diastolic dysfunction, normal RV systolic function and ventricular cavity size, and no significant valvular abnormality.  Hospital course was also notable for A-fib with RVR, for which she was placed on anticoagulation and amiodarone .    He was admitted to the hospital in 09/2023 with recurrent A-fib with unsuccessful DCCV x 2 by the  ED leading to subsequent admission and reinitiation of amiodarone  with subsequent conversion to sinus rhythm.  However, he was found to have a new diagnosis of hyperthyroidism leading to the discontinuation of amiodarone .  He was readmitted several days later with recurrent A-fib with RVR and was again started on amiodarone  with conversion to sinus rhythm followed by discontinuation of amiodarone  thereafter with initiation of Multaq .  He was admitted a third time in 09/2023 with recurrent A-fib with reinitiation of amiodarone  and plans for ablation at [facility] in 10/2023.  He was admitted for a fourth time in 09/2023 with recurrent A-fib with RVR.  During that admission he was evaluated by EP and underwent ablation and was loaded with Tikosyn .  Admission was also notable for new cardiomyopathy with an EF of 20 to 25%, global hypokinesis, severely dilated LV internal cavity size, moderately reduced RV systolic function with mildly enlarged ventricular cavity size and mildly elevated RVSP estimated at 42.2 mmHg, moderate to severe mitral regurgitation, mild mitral stenosis with restrictive posterior leaflet and a central MR jet, mild calcification of the aortic valve with trivial insufficiency and no evidence of stenosis.  Following A-fib ablation he has not had readmission for A-fib.  He has been managed in the outpatient setting by EP, A-fib clinic, and advanced heart failure.  Most recent echo in 12/2023, maintaining sinus rhythm, showed a persistent cardiomyopathy with an EF of 25 to 30%, global hypokinesis with apical septal and apical aneurysm, mild LVH of the basal septal segment, grade 1 diastolic dysfunction, normal RV  systolic function and ventricular cavity size, normal RVSP, severely dilated left atrium, mild mitral regurgitation, no evidence of mitral stenosis, and trivial aortic insufficiency.  In the setting of persistent cardiomyopathy, advanced heart failure recommended cardiac cath.  He comes in  accompanied by his wife today and is doing well from a cardiac perspective, currently without symptoms of angina or cardiac decompensation.  He reports approximately 1 month ago he did have some right shoulder and upper arm discomfort similar to what he experienced at the time of his MI in 2023, though discomfort only lasted several minutes and spontaneously resolved.  He has been without symptoms since.  He does continue to note some dizziness and fatigue.  No near-syncope or syncope.  No lower extremity swelling or progressive orthopnea.  No early satiety.  Adherent and tolerating cardiac pharmacotherapy without off target effect.  No falls, hematochezia, or melena.   Labs independently reviewed: 12/2023 - BNP 69, potassium 4.0, BUN 26, serum creatinine 0.72 11/2023 - magnesium  2.1, TSH less than 0.01, free T4 normal, free T3 elevated at 4.5 10/2023 - Hgb 14.6, PLT 185 09/2023 - A1c 5.0, albumin 2.9, AST/ALT not elevated 07/2023 - TC 118, TG 44, HDL 49, LDL 60 08/2021 - LP(a) 114  Past Medical History:  Diagnosis Date   A-fib (HCC)    Anxiety    HLD (hyperlipidemia)    HTN (hypertension)     Past Surgical History:  Procedure Laterality Date   A-FLUTTER ABLATION N/A 10/03/2023   Procedure: A-FLUTTER ABLATION;  Surgeon: Inocencio Soyla Lunger, MD;  Location: [facility] INVASIVE CV LAB;  Service: Cardiovascular;  Laterality: N/A;   ATRIAL FIBRILLATION ABLATION N/A 10/03/2023   Procedure: ATRIAL FIBRILLATION ABLATION;  Surgeon: Inocencio Soyla Lunger, MD;  Location: [facility] INVASIVE CV LAB;  Service: Cardiovascular;  Laterality: N/A;   CORONARY STENT INTERVENTION N/A 09/18/2021   Procedure: CORONARY STENT INTERVENTION;  Surgeon: Florencio Cara BIRCH, MD;  Location: [facility] INVASIVE CV LAB;  Service: Cardiovascular;  Laterality: N/A;   FACIAL FRACTURE SURGERY  1980   LEFT HEART CATH AND CORONARY ANGIOGRAPHY Left 04/11/2021   Procedure: LEFT HEART CATH AND CORONARY ANGIOGRAPHY;  Surgeon: Fernand Denyse LABOR, MD;  Location: [facility]  INVASIVE CV LAB;  Service: Cardiovascular;  Laterality: Left;   LEFT HEART CATH AND CORONARY ANGIOGRAPHY N/A 09/18/2021   Procedure: LEFT HEART CATH AND CORONARY ANGIOGRAPHY;  Surgeon: Florencio Cara BIRCH, MD;  Location: [facility] INVASIVE CV LAB;  Service: Cardiovascular;  Laterality: N/A;   LUMBAR LAMINECTOMY/DECOMPRESSION MICRODISCECTOMY N/A 08/26/2019   Procedure: RIGHT L4-5 MICRODISCECTOMY;  Surgeon: Clois Fret, MD;  Location: [facility] ORS;  Service: Neurosurgery;  Laterality: N/A;    Current Medications: No outpatient medications have been marked as taking for the 01/16/24 encounter (Office Visit) with Abigail Bernardino HERO, PA-C.    Allergies:   Amiodarone    Social History   Socioeconomic History   Marital status: Married    Spouse name: Not on file   Number of children: Not on file   Years of education: Not on file   Highest education level: Not on file  Occupational History   Not on file  Tobacco Use   Smoking status: Former    Current packs/day: 0.00    Types: Cigarettes    Quit date: 04/02/1988    Years since quitting: 35.8    Passive exposure: Past   Smokeless tobacco: Never  Vaping Use   Vaping status: Not on file  Substance and Sexual Activity   Alcohol use: No   Drug  use: Never   Sexual activity: Not on file  Other Topics Concern   Not on file  Social History Narrative   Not on file   Social Drivers of Health   Financial Resource Strain: Not on file  Food Insecurity: No Food Insecurity (09/23/2023)   Hunger Vital Sign    Worried About Running Out of Food in the Last Year: Never true    Ran Out of Food in the Last Year: Never true  Transportation Needs: No Transportation Needs (09/23/2023)   PRAPARE - Administrator, Civil Service (Medical): No    Lack of Transportation (Non-Medical): No  Physical Activity: Not on file  Stress: Not on file  Social Connections: Moderately Integrated (09/23/2023)   Social Connection and Isolation Panel    Frequency of  Communication with Friends and Family: More than three times a week    Frequency of Social Gatherings with Friends and Family: More than three times a week    Attends Religious Services: More than 4 times per year    Active Member of [employer] or Organizations: No    Attends Banker Meetings: Never    Marital Status: Married     Family History:  The patient's family history includes COPD in his mother; Heart attack in his father; Valvular heart disease in his sister.  ROS:   12-point review of systems is negative unless otherwise noted in the HPI.   EKGs/Labs/Other Studies Reviewed:    Studies reviewed were summarized above. The additional studies were reviewed today:  2D echo 12/27/2023: 1. Global hypokinesis with apical septal and apical aneurysm. Left  ventricular ejection fraction, by estimation, is 25 to 30%. The left  ventricle has severely decreased function. The left ventricle demonstrates  regional wall motion abnormalities (see  scoring diagram/findings for description). There is mild left ventricular  hypertrophy of the basal-septal segment. Left ventricular diastolic  parameters are consistent with Grade I diastolic dysfunction (impaired  relaxation).   2. Right ventricular systolic function is normal. The right ventricular  size is normal. There is normal pulmonary artery systolic pressure.   3. Left atrial size was severely dilated.   4. The mitral valve is normal in structure. Mild mitral valve  regurgitation. No evidence of mitral stenosis.   5. The aortic valve is tricuspid. Aortic valve regurgitation is trivial.  No aortic stenosis is present.   6. The inferior vena cava is normal in size with greater than 50%  respiratory variability, suggesting right atrial pressure of 3 mmHg.  __________  TEE 09/24/2023: 1. Left ventricular ejection fraction, by estimation, is <20%. The left  ventricle has severely decreased function.   2. Right ventricular  systolic function is severely reduced. The right  ventricular size is normal.   3. Left atrial size was mildly dilated. No left atrial/left atrial  appendage thrombus was detected.   4. The mitral valve is normal in structure. Mild mitral valve  regurgitation.   5. The aortic valve is normal in structure. Aortic valve regurgitation is  not visualized.  __________  2D echo 09/23/2023: 1. Left ventricular ejection fraction, by estimation, is 20 to 25%. The  left ventricle has severely decreased function. The left ventricle  demonstrates global hypokinesis. The left ventricular internal cavity size  was moderately to severely dilated.  Left ventricular diastolic parameters are indeterminate.   2. Right ventricular systolic function is moderately reduced. The right  ventricular size is mildly enlarged. There is mildly elevated pulmonary  artery systolic pressure. The estimated right ventricular systolic  pressure is 42.2 mmHg.   3. The mitral valve is degenerative. Moderate to severe mitral valve  regurgitation. Mild mitral stenosis. Restrictive posterior leafleft,  central MR jet. Recommend repeat echo when better compensated.   4. The aortic valve is tricuspid. There is mild calcification of the  aortic valve. Aortic valve regurgitation is trivial. Aortic valve  sclerosis is present, with no evidence of aortic valve stenosis.   5. The inferior vena cava is dilated in size with <50% respiratory  variability, suggesting right atrial pressure of 15 mmHg.  __________  2D echo 09/19/2021: 1. Left ventricular ejection fraction, by estimation, is 45 to 50%. The  left ventricle has mildly decreased function. The left ventricle  demonstrates regional wall motion abnormalities (see scoring  diagram/findings for description). There is mild left  ventricular hypertrophy. Left ventricular diastolic parameters are  consistent with Grade I diastolic dysfunction (impaired relaxation). There  is  hypokinesis of the left ventricular, mid-apical anterior wall.   2. Right ventricular systolic function is normal. The right ventricular  size is normal.   3. The mitral valve is normal in structure. No evidence of mitral valve  regurgitation. No evidence of mitral stenosis.   4. The aortic valve is normal in structure. Aortic valve regurgitation is  not visualized. No aortic stenosis is present.  __________  [facility] 09/18/2021 Avelina):   Mid LAD-2 lesion is 100% stenosed.   Mid LAD-1 lesion is 70% stenosed.   2nd Diag lesion is 75% stenosed.   Dist RCA lesion is 50% stenosed.   A drug-eluting stent was successfully placed using a STENT ONYX FRONTIER 2.5X22.   Post intervention, there is a 0% residual stenosis.   There is moderate left ventricular systolic dysfunction.   LV end diastolic pressure is mildly elevated.   The left ventricular ejection fraction is 35-45% by visual estimate.   Conclusion Moderately reduced left ventricular function with anterior apical akinesis EF around 35 to 40%   Coronary Left main large relatively free of disease LAD large with a 75% proximal 100% mid TIMI 0 flow Diagonal 2 had a 80% proximal lesion TIMI-3 flow Circumflex large minor irregularities RCA with large minor irregularities with a 50% distal lesion TIMI-3 flow   Intervention Successful PCI and stent of mid LAD with DES stent Onyx frontier 2.5 x 22 mm stent was deployed Postdilated with a  trek Neo at 14 atm Lesion was reduced from 100% down to 0% and TIMI-3 flow was restored from TIMI 0 Minx was deployed in the right groin Patient tolerated procedure well No complications __________  [facility] 04/11/2021 ([facility]):   Mid LAD lesion is 40% stenosed.   The left ventricular systolic function is normal.   LV end diastolic pressure is normal.   There is no aortic valve stenosis.   Mild mid LAD disease, treat medically. __________  2D echo 08/27/2019 ([facility]): 1. Left ventricular  ejection fraction, by estimation, is 60 to 65%. The  left ventricle has normal function. The left ventricle has no regional  wall motion abnormalities. There is severe concentric left ventricular  hypertrophy. Left ventricular diastolic   parameters are consistent with Grade I diastolic dysfunction (impaired  relaxation).   2. Right ventricular systolic function is normal. The right ventricular  size is normal. There is normal pulmonary artery systolic pressure.   3. The mitral valve is normal in structure. Trivial mitral valve  regurgitation. No evidence of mitral stenosis.  4. The aortic valve is normal in structure. Aortic valve regurgitation is  not visualized. No aortic stenosis is present.   5. The inferior vena cava is normal in size with greater than 50%  respiratory variability, suggesting right atrial pressure of 3 mmHg.     EKG:  EKG is ordered today.  The EKG ordered today demonstrates sinus bradycardia, 53 bpm, LBBB (known and stable)  Recent Labs: 09/26/2023: ALT 15 10/01/2023: Hemoglobin 14.6; Platelets 185 11/20/2023: TSH <0.01 11/22/2023: Magnesium  2.1 12/27/2023: B Natriuretic Peptide 69.4; BUN 26; Creatinine, Ser 0.72; Potassium 4.0; Sodium 137  Recent Lipid Panel    Component Value Date/Time   CHOL 109 09/17/2021 0907   TRIG 23 09/17/2021 0907   HDL 49 09/17/2021 0907   CHOLHDL 2.2 09/17/2021 0907   VLDL 5 09/17/2021 0907   LDLCALC 55 09/17/2021 0907    PHYSICAL EXAM:    VS:  BP 118/60 (BP Location: Left Arm, Patient Position: Sitting)   Pulse (!) 53   Ht 6' (1.829 m)   Wt 192 lb (87.1 kg)   SpO2 98%   BMI 26.04 kg/m   BMI: Body mass index is 26.04 kg/m.  Physical Exam Vitals reviewed.  Constitutional:      Appearance: He is well-developed.  HENT:     Head: Normocephalic and atraumatic.  Eyes:     General:        Right eye: No discharge.        Left eye: No discharge.  Cardiovascular:     Rate and Rhythm: Regular rhythm. Bradycardia present.      Pulses:          Posterior tibial pulses are 2+ on the right side and 2+ on the left side.     Heart sounds: Normal heart sounds, S1 normal and S2 normal. Heart sounds not distant. No midsystolic click and no opening snap. No murmur heard.    No friction rub.  Pulmonary:     Effort: Pulmonary effort is normal. No respiratory distress.     Breath sounds: Normal breath sounds. No decreased breath sounds, wheezing, rhonchi or rales.  Musculoskeletal:     Cervical back: Normal range of motion.     Right lower leg: No edema.     Left lower leg: No edema.  Skin:    General: Skin is warm and dry.     Nails: There is no clubbing.  Neurological:     Mental Status: He is alert and oriented to person, place, and time.  Psychiatric:        Speech: Speech normal.        Behavior: Behavior normal.        Thought Content: Thought content normal.        Judgment: Judgment normal.     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  Continue current GDMT including Entresto  24/26 mg twice daily, empagliflozin  10 mg, and metoprolol  succinate 25 mg.  Not requiring standing loop diuretic.  Spironolactone  previously discontinued due to relative hypotension.  Consider rechallenge of MRA post cath, deferred today with relative hypotension, dizziness, and fatigue.  Schedule R/LHC given persistent cardiomyopathy despite restoration of sinus rhythm.  Apixaban  will need to  be held 2 days prior to cardiac cath with recommendation to resume as soon as possible postprocedure to minimize interruption and reduce CVA risk.  Anticipate cardiac MRI post cath pending results.  Ongoing management per advanced heart failure.  CAD involving native coronary arteries without angina: He is  without symptoms of angina or cardiac decompensation.  He is on apixaban  in lieu of aspirin .  Planning for diagnostic R/LHC as outlined above to further evaluate his cardiomyopathy.  Continue rosuvastatin  20 mg.  Persistent A-fib: Maintaining sinus rhythm with a mildly bradycardic rate on Toprol -XL 25 mg daily and dofetilide  125 mcg twice daily.  Maintain potassium and magnesium  at goal 4.0 and 2.0 respectively.  CHA2DS2-VASc at least 4 (CHF, HTN, age x 1, vascular disease).  Remains on apixaban  5 mg twice daily.  No falls or symptoms concerning for bleeding.  Check CBC, BMP, and magnesium .  Mitral regurgitation: Mild by echo.  No murmur noted on exam.  HTN: Blood pressure is well-controlled in the office today.  Continue pharmacotherapy as outlined above.  HLD: LDL 60 in 07/2023.  Target LDL at least less than 70.  Remains on rosuvastatin  20 mg.  Hyperthyroidism: Most recent TSH remains suppressed with elevated free T3.  Remains on methimazole .  Avoid future amiodarone  use.  Followed by endocrinology.  High risk medication use: Remains on dofetilide .  EKG demonstrates known LBBB with stable QTc.  Most recent magnesium  and potassium from 11/2023 and 12/2023 at goal 2.0 and 4.0, respectively.  Ongoing management and monitoring per EP.   Informed Consent   Shared Decision Making/Informed Consent{  The risks [stroke (1 in 1000), death (1 in 1000), kidney failure [usually temporary] (1 in 500), bleeding (1 in 200), allergic reaction [possibly serious] (1 in 200)], benefits (diagnostic support and management of coronary artery disease) and alternatives of a cardiac catheterization were discussed in detail with Mr. Otterson and he is willing to proceed.        Disposition: F/u with me 1-2 weeks after [facility], as well as advanced heart failure and EP as directed.   Medication Adjustments/Labs and Tests Ordered: Current medicines are reviewed at length with the patient today.  Concerns regarding medicines  are outlined above. Medication changes, Labs and Tests ordered today are summarized above and listed in the Patient Instructions accessible in Encounters.   Signed, Bernardino Bring, PA-C 01/16/2024 3:49 PM      [facility] [address] [place], [place] [address] [phone]

## 2024-01-17 LAB — BASIC METABOLIC PANEL WITH GFR
BUN/Creatinine Ratio: 30 — ABNORMAL HIGH (ref 10–24)
BUN: 21 mg/dL (ref 8–27)
CO2: 22 mmol/L (ref 20–29)
Calcium: 8.9 mg/dL (ref 8.6–10.2)
Chloride: 101 mmol/L (ref 96–106)
Creatinine, Ser: 0.69 mg/dL — ABNORMAL LOW (ref 0.76–1.27)
Glucose: 80 mg/dL (ref 70–99)
Potassium: 4.2 mmol/L (ref 3.5–5.2)
Sodium: 137 mmol/L (ref 134–144)
eGFR: 102 mL/min/1.73 (ref >59)

## 2024-01-17 LAB — CBC
Hematocrit: 46.5 % (ref 37.5–51.0)
Hemoglobin: 15.7 g/dL (ref 13.0–17.7)
MCH: 31.8 pg (ref 26.6–33.0)
MCHC: 33.8 g/dL (ref 31.5–35.7)
MCV: 94 fL (ref 79–97)
Platelets: 143 x10E3/uL — ABNORMAL LOW (ref 150–450)
RBC: 4.93 x10E6/uL (ref 4.14–5.80)
RDW: 13.1 % (ref 11.6–15.4)
WBC: 4.5 x10E3/uL (ref 3.4–10.8)

## 2024-01-17 LAB — MAGNESIUM: Magnesium: 2 mg/dL (ref 1.6–2.3)

## 2024-01-20 ENCOUNTER — Other Ambulatory Visit: Payer: Self-pay

## 2024-01-20 ENCOUNTER — Ambulatory Visit (HOSPITAL_COMMUNITY)
Admission: RE | Admit: 2024-01-20 | Discharge: 2024-01-20 | Disposition: A | Discharge status: Home / Self Care | Attending: Internal Medicine | Admitting: Internal Medicine

## 2024-01-20 ENCOUNTER — Ambulatory Visit (HOSPITAL_COMMUNITY)
Admission: RE | Disposition: A | Discharge status: Home / Self Care | Payer: Self-pay | Source: Home / Self Care | Attending: Internal Medicine

## 2024-01-20 ENCOUNTER — Ambulatory Visit: Payer: Self-pay | Admitting: Cardiology

## 2024-01-20 ENCOUNTER — Encounter (HOSPITAL_COMMUNITY): Payer: Self-pay | Admitting: Internal Medicine

## 2024-01-20 DIAGNOSIS — I4819 Other persistent atrial fibrillation: Secondary | ICD-10-CM | POA: Diagnosis not present

## 2024-01-20 DIAGNOSIS — Z7901 Long term (current) use of anticoagulants: Secondary | ICD-10-CM | POA: Diagnosis not present

## 2024-01-20 DIAGNOSIS — E0590 Thyrotoxicosis, unspecified without thyrotoxic crisis or storm: Secondary | ICD-10-CM | POA: Diagnosis not present

## 2024-01-20 DIAGNOSIS — Z955 Presence of coronary angioplasty implant and graft: Secondary | ICD-10-CM | POA: Insufficient documentation

## 2024-01-20 DIAGNOSIS — I5022 Chronic systolic (congestive) heart failure: Secondary | ICD-10-CM | POA: Insufficient documentation

## 2024-01-20 DIAGNOSIS — I110 Hypertensive heart disease with heart failure: Secondary | ICD-10-CM | POA: Diagnosis not present

## 2024-01-20 DIAGNOSIS — Z79899 Other long term (current) drug therapy: Secondary | ICD-10-CM | POA: Diagnosis not present

## 2024-01-20 DIAGNOSIS — Z7982 Long term (current) use of aspirin: Secondary | ICD-10-CM | POA: Diagnosis not present

## 2024-01-20 DIAGNOSIS — I429 Cardiomyopathy, unspecified: Secondary | ICD-10-CM | POA: Insufficient documentation

## 2024-01-20 DIAGNOSIS — Z7984 Long term (current) use of oral hypoglycemic drugs: Secondary | ICD-10-CM | POA: Diagnosis not present

## 2024-01-20 DIAGNOSIS — I447 Left bundle-branch block, unspecified: Secondary | ICD-10-CM | POA: Diagnosis not present

## 2024-01-20 DIAGNOSIS — I252 Old myocardial infarction: Secondary | ICD-10-CM | POA: Insufficient documentation

## 2024-01-20 DIAGNOSIS — Z87891 Personal history of nicotine dependence: Secondary | ICD-10-CM | POA: Diagnosis not present

## 2024-01-20 DIAGNOSIS — I5020 Unspecified systolic (congestive) heart failure: Secondary | ICD-10-CM

## 2024-01-20 DIAGNOSIS — I2510 Atherosclerotic heart disease of native coronary artery without angina pectoris: Secondary | ICD-10-CM | POA: Insufficient documentation

## 2024-01-20 HISTORY — PX: RIGHT/LEFT HEART CATH AND CORONARY ANGIOGRAPHY: CATH118266

## 2024-01-20 LAB — POCT I-STAT EG7
Acid-Base Excess: 0 mmol/L (ref 0.0–2.0)
Bicarbonate: 25.7 mmol/L (ref 20.0–28.0)
Calcium, Ion: 1.18 mmol/L (ref 1.15–1.40)
HCT: 45 % (ref 39.0–52.0)
Hemoglobin: 15.3 g/dL (ref 13.0–17.0)
O2 Saturation: 69 %
Potassium: 3.9 mmol/L (ref 3.5–5.1)
Sodium: 139 mmol/L (ref 135–145)
TCO2: 27 mmol/L (ref 22–32)
pCO2, Ven: 44.5 mmHg (ref 44–60)
pH, Ven: 7.369 (ref 7.25–7.43)
pO2, Ven: 37 mmHg (ref 32–45)

## 2024-01-20 LAB — POCT I-STAT 7, (LYTES, BLD GAS, ICA,H+H)
Acid-base deficit: 1 mmol/L (ref 0.0–2.0)
Bicarbonate: 23.5 mmol/L (ref 20.0–28.0)
Calcium, Ion: 1.16 mmol/L (ref 1.15–1.40)
HCT: 45 % (ref 39.0–52.0)
Hemoglobin: 15.3 g/dL (ref 13.0–17.0)
O2 Saturation: 98 %
Potassium: 3.9 mmol/L (ref 3.5–5.1)
Sodium: 139 mmol/L (ref 135–145)
TCO2: 25 mmol/L (ref 22–32)
pCO2 arterial: 38.1 mmHg (ref 32–48)
pH, Arterial: 7.397 (ref 7.35–7.45)
pO2, Arterial: 105 mmHg (ref 83–108)

## 2024-01-20 SURGERY — RIGHT/LEFT HEART CATH AND CORONARY ANGIOGRAPHY
Anesthesia: LOCAL

## 2024-01-20 MED ORDER — LIDOCAINE HCL (PF) 1 % IJ SOLN
INTRAMUSCULAR | Status: AC
Start: 2024-01-20 — End: 2024-01-20
  Filled 2024-01-20: qty 30

## 2024-01-20 MED ORDER — HEPARIN SODIUM (PORCINE) 1000 UNIT/ML IJ SOLN
INTRAMUSCULAR | Status: DC | PRN
  Administered 2024-01-20: 4500 [IU] via INTRAVENOUS

## 2024-01-20 MED ORDER — FREE WATER: 500.0000 mL | Freq: Once | Status: DC

## 2024-01-20 MED ORDER — HEPARIN SODIUM (PORCINE) 1000 UNIT/ML IJ SOLN
INTRAMUSCULAR | Status: AC
  Filled 2024-01-20: qty 10

## 2024-01-20 MED ORDER — SODIUM CHLORIDE 0.9 % IV SOLN: 250.0000 mL | INTRAVENOUS | Status: DC | PRN

## 2024-01-20 MED ORDER — MIDAZOLAM HCL (PF) 2 MG/2ML IJ SOLN
INTRAMUSCULAR | Status: DC | PRN
  Administered 2024-01-20 (×2): .5 mg via INTRAVENOUS

## 2024-01-20 MED ORDER — IOHEXOL 350 MG/ML SOLN
INTRAVENOUS | Status: DC | PRN
  Administered 2024-01-20: 25 mL

## 2024-01-20 MED ORDER — SODIUM CHLORIDE 0.9% FLUSH: 3.0000 mL | Freq: Two times a day (BID) | INTRAVENOUS | Status: DC

## 2024-01-20 MED ORDER — ASPIRIN 81 MG PO CHEW
CHEWABLE_TABLET | ORAL | Status: AC
  Administered 2024-01-20: 81 mg via ORAL
  Filled 2024-01-20: qty 1

## 2024-01-20 MED ORDER — SODIUM CHLORIDE 0.9% FLUSH: 3.0000 mL | INTRAVENOUS | Status: DC | PRN

## 2024-01-20 MED ORDER — LIDOCAINE HCL (PF) 1 % IJ SOLN
INTRAMUSCULAR | Status: DC | PRN
  Administered 2024-01-20 (×2): 2 mL via INTRADERMAL

## 2024-01-20 MED ORDER — HYDRALAZINE HCL 20 MG/ML IJ SOLN: 10.0000 mg | INTRAMUSCULAR | Status: DC | PRN

## 2024-01-20 MED ORDER — HEPARIN (PORCINE) IN NACL 1000-0.9 UT/500ML-% IV SOLN
INTRAVENOUS | Status: DC | PRN
  Administered 2024-01-20 (×2): 500 mL

## 2024-01-20 MED ORDER — MIDAZOLAM HCL 2 MG/2ML IJ SOLN
INTRAMUSCULAR | Status: AC
  Filled 2024-01-20: qty 2

## 2024-01-20 MED ORDER — FENTANYL CITRATE (PF) 100 MCG/2ML IJ SOLN
INTRAMUSCULAR | Status: AC
  Filled 2024-01-20: qty 2

## 2024-01-20 MED ORDER — ONDANSETRON HCL 4 MG/2ML IJ SOLN: 4.0000 mg | Freq: Four times a day (QID) | INTRAMUSCULAR | Status: DC | PRN

## 2024-01-20 MED ORDER — ACETAMINOPHEN 325 MG PO TABS: 650.0000 mg | ORAL_TABLET | ORAL | Status: DC | PRN

## 2024-01-20 MED ORDER — LIDOCAINE HCL (PF) 1 % IJ SOLN
INTRAMUSCULAR | Status: AC
  Filled 2024-01-20: qty 30

## 2024-01-20 MED ORDER — LABETALOL HCL 5 MG/ML IV SOLN: 10.0000 mg | INTRAVENOUS | Status: DC | PRN

## 2024-01-20 MED ORDER — FREE WATER: 250.0000 mL | Freq: Once | Status: DC

## 2024-01-20 MED ORDER — VERAPAMIL HCL 2.5 MG/ML IV SOLN
INTRAVENOUS | Status: AC
  Filled 2024-01-20: qty 2

## 2024-01-20 MED ORDER — ASPIRIN 81 MG PO CHEW: 81.0000 mg | CHEWABLE_TABLET | ORAL | Status: AC

## 2024-01-20 MED ORDER — FENTANYL CITRATE (PF) 100 MCG/2ML IJ SOLN
INTRAMUSCULAR | Status: DC | PRN
  Administered 2024-01-20 (×2): 12.5 ug via INTRAVENOUS

## 2024-01-20 MED ORDER — VERAPAMIL HCL 2.5 MG/ML IV SOLN
INTRAVENOUS | Status: DC | PRN
  Administered 2024-01-20: 10 mL via INTRA_ARTERIAL

## 2024-01-20 SURGICAL SUPPLY — 9 items
CATH 5FR JL3.5 JR4 ANG PIG MP (CATHETERS) IMPLANT
CATH BALLN WEDGE 5F 110CM (CATHETERS) IMPLANT
DEVICE RAD COMP TR BAND LRG (VASCULAR PRODUCTS) IMPLANT
ELECT DEFIB PAD ADLT CADENCE (PAD) IMPLANT
GLIDESHEATH SLEND SS 6F .021 (SHEATH) IMPLANT
GUIDEWIRE INQWIRE 1.5J.035X260 (WIRE) IMPLANT
PACK CARDIAC CATHETERIZATION (CUSTOM PROCEDURE TRAY) ×1 IMPLANT
SET ATX-X65L (MISCELLANEOUS) IMPLANT
SHEATH GLIDE SLENDER 4/5FR (SHEATH) IMPLANT

## 2024-01-20 NOTE — Interval H&P Note (Signed)
 History and Physical Interval Note:  01/20/2024 11:01 AM  Tommy Rowe Dawn  has presented today for surgery, with the diagnosis of cardiomyopathy.  The various methods of treatment have been discussed with the patient and family. After consideration of risks, benefits and other options for treatment, the patient has consented to  Procedure(s): RIGHT/LEFT HEART CATH AND CORONARY ANGIOGRAPHY (N/A) as a surgical intervention.  The patient's history has been reviewed, patient examined, no change in status, stable for surgery.  I have reviewed the patient's chart and labs.  Questions were answered to the patient's satisfaction.    Cath Lab Visit (complete for each Cath Lab visit)  Clinical Evaluation Leading to the Procedure:   ACS: No.  Non-ACS:    Anginal/Heart Failure Classification: NYHA class II  Anti-ischemic medical therapy: Minimal Therapy (1 class of medications)  Non-Invasive Test Results: Echo with LVEF 25-30% -> high risk  Prior CABG: No previous CABG  Yee Gangi

## 2024-01-20 NOTE — Brief Op Note (Signed)
 BRIEF CARDIAC CATHETERIZATION NOTE  01/20/2024  11:39 AM  PATIENT:  [name]  68 y.o. male  PRE-OPERATIVE DIAGNOSIS:  cardiomyopathy  POST-OPERATIVE DIAGNOSIS:  Same  PROCEDURE:  Procedure(s): RIGHT/LEFT HEART CATH AND CORONARY ANGIOGRAPHY (N/A)  SURGEON:  Surgeons and Role:    * Takai Chiaramonte, Lonni, MD - Primary  FINDINGS: Moderate to severe diagonal disease.  Otherwise, non-obstructive CAD with widely patent mid LAD stent. Normal left and right heart filling pressures. Mildly reduced Fick cardiac output/index.  RECOMMENDATIONS: Secondary prevention of coronary artery disease. Maintain net even fluid balance and escalate goal-directed medical therapy as tolerated. If no evidence of bleeding/vascular injury, apixaban  can be restarted tomorrow morning.  Lonni Hanson, MD [facility]

## 2024-01-20 NOTE — Discharge Instructions (Signed)

## 2024-01-20 NOTE — Progress Notes (Signed)
 Labs have remained stable.  Continue current medication regimen without changes needed at this time.

## 2024-01-21 ENCOUNTER — Ambulatory Visit: Admitting: Cardiology

## 2024-01-21 MED FILL — Lidocaine HCl Local Preservative Free (PF) Inj 1%: INTRAMUSCULAR | Qty: 30 | Status: AC

## 2024-01-29 ENCOUNTER — Telehealth: Payer: Self-pay | Admitting: Cardiology

## 2024-01-29 ENCOUNTER — Ambulatory Visit: Payer: Self-pay

## 2024-01-29 NOTE — Telephone Encounter (Signed)
 Pt states that he is returning nurses call. Please advise

## 2024-01-30 NOTE — Telephone Encounter (Signed)
 Spoke with patient in regards to previous message to schedule an appointment with EP APP to discuss ICD per Dr. Inocencio. Appointment has been made for 11/5.

## 2024-02-04 NOTE — Progress Notes (Unsigned)
  Electrophysiology Office Note:   Date:  02/05/2024  ID:  Tommy Rowe, DOB Aug 02, 1955, MRN [id number]  Primary Cardiologist: Cara JONETTA Lovelace, MD Electrophysiologist: Soyla Gladis Norton, MD   Electrophysiologist:  Soyla Gladis Norton, MD  Electrophysiology APP:  Riddle, Suzann, NP  Advanced Heart Failure:  Ria Commander, DO      History of Present Illness:   Tommy Rowe is a 68 y.o. male with h/o atrial fibrillation/flutter, coronary artery disease post LAD stent, hypertension, hyperlipidemia, left bundle Tommy block, hypothyroidism seen today for routine electrophysiology followup.   LV dysfunction has persisted despite AF ablation and GDMT; LHC without targets for intervention. Brought back in to discuss ICD.   Since last being seen in our clinic the patient reports doing OK. .  he denies chest pain, palpitations, dyspnea, PND, orthopnea, nausea, vomiting, dizziness, syncope, edema, weight gain, or early satiety.   Review of systems complete and found to be negative unless listed in HPI.   EP Information / Studies Reviewed:    EKG is not ordered today. EKG from 01/16/2024 reviewed which showed sinus bradycardia at 53 bpm with LBBB (known)       Arrhythmia/Device History AAD Amiodarone  - stopped d/t hyperthyroid Tikosyn  - loaded 09/2023   Physical Exam:   VS:  BP 118/78   Pulse 68   Ht 6' (1.829 m)   Wt 194 lb (88 kg)   SpO2 98%   BMI 26.31 kg/m    Wt Readings from Last 3 Encounters:  02/05/24 194 lb (88 kg)  01/20/24 190 lb (86.2 kg)  01/16/24 192 lb (87.1 kg)     GEN: No acute distress NECK: No JVD; No carotid bruits CARDIAC: Regular rate and rhythm, no murmurs, rubs, gallops RESPIRATORY:  Clear to auscultation without rales, wheezing or rhonchi  ABDOMEN: Soft, non-tender, non-distended EXTREMITIES:  No edema; No deformity   ASSESSMENT AND PLAN:    Chronic systolic CHF LBBB EF 25-30% by echo 12/2023 [facility] 01/20/2024 with at least moderate disease  and no intervention LV dysfunction persistent despite GDMT Explained risks, benefits, and alternatives to ICD implantation, including but not limited to bleeding, infection, pneumothorax, pericardial effusion, lead dislodgement, heart attack, stroke, or death.  Pt verbalized understanding and agrees to proceed.    Will need to hold ICD  Given LBBB would likely plan for CRT-D vs conduction system pacing.     Persistent AF/AF Continue tikosyn  125 mcg BID EKG stable as above.  Labs stable 10/16  High risk medication monitoring - Tikosyn  Patient requires ongoing monitoring for anti-arrhythmic medication which has the potential to cause life threatening arrhythmias or AV block.   CAD No s/s of ischemia.       Follow up with Dr. Norton as usual post procedure.  Pt would prefer to wait until after the holidays to schedule.   Signed, Ozell Prentice Passey, PA-C

## 2024-02-05 ENCOUNTER — Encounter: Payer: Self-pay | Admitting: Student

## 2024-02-05 ENCOUNTER — Ambulatory Visit: Attending: Student | Admitting: Student

## 2024-02-05 VITALS — BP 118/78 | HR 68 | Ht 72.0 in | Wt 194.0 lb

## 2024-02-05 DIAGNOSIS — I10 Essential (primary) hypertension: Secondary | ICD-10-CM | POA: Diagnosis present

## 2024-02-05 DIAGNOSIS — I447 Left bundle-branch block, unspecified: Secondary | ICD-10-CM | POA: Diagnosis present

## 2024-02-05 DIAGNOSIS — I5020 Unspecified systolic (congestive) heart failure: Secondary | ICD-10-CM | POA: Diagnosis present

## 2024-02-05 DIAGNOSIS — I4819 Other persistent atrial fibrillation: Secondary | ICD-10-CM | POA: Insufficient documentation

## 2024-02-05 DIAGNOSIS — I2510 Atherosclerotic heart disease of native coronary artery without angina pectoris: Secondary | ICD-10-CM | POA: Diagnosis present

## 2024-02-05 NOTE — Progress Notes (Unsigned)
 Cardiology Office Note    Date:  02/06/2024   ID:  Tommy Rowe, DOB 06/28/1955, MRN [id number]  PCP:  Darra Hamilton, PA-C  Cardiologist:  Cara JONETTA Lovelace, MD  Electrophysiologist:  Soyla Gladis Norton, MD   Chief Complaint: Follow up  History of Present Illness:   Tommy Rowe is a 68 y.o. male with history of CAD status post PCI to the LAD in 2023, HFrEF, persistent A-fib status post multiple unsuccessful DCCV status post ablation in 10/2023 on Tikosyn  and apixaban , LBBB, HTN, HLD, lumbar radiculopathy, osteoarthritis, hypothyroidism who presents for follow up of R/LHC at the request of advanced heart failure.   Patient was previously followed by Drs. Fernand with [facility] and Dr. Lovelace with [facility].  Prior echo in 08/2019 showed an EF of 60 to 65%, no regional wall motion abnormalities, severe concentric LVH, grade 1 diastolic dysfunction, normal RV systolic function and ventricular cavity size, normal RVSP, trivial mitral regurgitation, and an estimated right atrial pressure of 3 mmHg.  LHC in 04/2021 showed mid LAD 40% stenosis with normal LV systolic function and LVEDP.  He was admitted to the hospital in 08/2021 with an NSTEMI.  LHC showed an occluded mid LAD with a second lesion along the mid LAD estimated at 70%, 75% stenosis of D2, and 50% stenosis of the distal RCA.  He underwent successful PCI/DES to the LAD.  LVEF of 35 to 45% by LV gram with anterior apical akinesis.  Post-procedure echo showed an EF of 45 to 50%, hypokinesis of the mid apical anterior wall, mild LVH, grade 1 diastolic dysfunction, normal RV systolic function and ventricular cavity size, and no significant valvular abnormality.  Hospital course was also notable for A-fib with RVR, for which she was placed on anticoagulation and amiodarone .     He was admitted to the hospital in 09/2023 with recurrent A-fib with unsuccessful DCCV x 2 by the ED leading to subsequent admission and reinitiation of  amiodarone  with subsequent conversion to sinus rhythm.  However, he was found to have a new diagnosis of hyperthyroidism leading to the discontinuation of amiodarone .  He was readmitted several days later with recurrent A-fib with RVR and was again started on amiodarone  with conversion to sinus rhythm followed by discontinuation of amiodarone  thereafter with initiation of Multaq .  He was admitted a third time in 09/2023 with recurrent A-fib with reinitiation of amiodarone  and plans for ablation at [facility] in 10/2023.  He was admitted for a fourth time in 09/2023 with recurrent A-fib with RVR.  During that admission he was evaluated by EP and underwent ablation and was loaded with Tikosyn .  Admission was also notable for new cardiomyopathy with an EF of 20 to 25%, global hypokinesis, severely dilated LV internal cavity size, moderately reduced RV systolic function with mildly enlarged ventricular cavity size and mildly elevated RVSP estimated at 42.2 mmHg, moderate to severe mitral regurgitation, mild mitral stenosis with restrictive posterior leaflet and a central MR jet, mild calcification of the aortic valve with trivial insufficiency and no evidence of stenosis.   Following A-fib ablation he has not had readmission for A-fib.  He has been managed in the outpatient setting by EP, A-fib clinic, and advanced heart failure.    Most recent echo in 12/2023, maintaining sinus rhythm, showed a persistent cardiomyopathy with an EF of 25 to 30%, global hypokinesis with apical septal and apical aneurysm, mild LVH of the basal septal segment, grade 1 diastolic dysfunction, normal RV systolic  function and ventricular cavity size, normal RVSP, severely dilated left atrium, mild mitral regurgitation, no evidence of mitral stenosis, and trivial aortic insufficiency.  In the setting of persistent cardiomyopathy, advanced heart failure recommended cardiac cath.  R/LHC on 01/20/2024 showed moderate to severe diagonal disease with  otherwise nonobstructive CAD and widely patent mid LAD stent. Normal left and right filling pressure and mildly reduced cardiac output/index. Medical therapy was recommended.   He was evaluated by EP on 11/5 with plans for CRT-D versus conduction system pacing.  He comes in today for follow-up of cardiac cath and is doing well, without symptoms of angina or cardiac decompensation.  No dizziness, presyncope, or syncope.  No falls or symptoms concerning for bleeding.  No lower extremity swelling or progressive orthopnea.  He reports he has good days and bad days.  When further questioned about this is bad days are typically associated with fatigue.  Weight remains stable.  He has not further on his device implantation and would like to go ahead and move forward with this, not waiting until after the holidays.  No right radial arteriotomy site complications.  Adherent and tolerating cardiac pharmacotherapy without off target effect.   Labs independently reviewed: 01/2024 - magnesium  2.0, Hgb 15.7, PLT 143, BUN 21, serum creatinine 0.69, potassium 4.2 12/2023 - BNP 69 11/2023 - TSH less than 0.01, free T4 normal, free T3 elevated at 4.5 10/2023 - Hgb 14.6, PLT 185 09/2023 - A1c 5.0, albumin 2.9, AST/ALT not elevated 07/2023 - TC 118, TG 44, HDL 49, LDL 60 08/2021 - LP(a) 114  Past Medical History:  Diagnosis Date   A-fib (HCC)    Anxiety    CAD (coronary artery disease)    HFrEF (heart failure with reduced ejection fraction) (HCC)    HLD (hyperlipidemia)    HTN (hypertension)    Hyperthyroidism    LBBB (left bundle branch block)     Past Surgical History:  Procedure Laterality Date   A-FLUTTER ABLATION N/A 10/03/2023   Procedure: A-FLUTTER ABLATION;  Surgeon: Inocencio Soyla Lunger, MD;  Location: [facility] INVASIVE CV LAB;  Service: Cardiovascular;  Laterality: N/A;   ATRIAL FIBRILLATION ABLATION N/A 10/03/2023   Procedure: ATRIAL FIBRILLATION ABLATION;  Surgeon: Inocencio Soyla Lunger, MD;  Location: [facility]  INVASIVE CV LAB;  Service: Cardiovascular;  Laterality: N/A;   CORONARY STENT INTERVENTION N/A 09/18/2021   Procedure: CORONARY STENT INTERVENTION;  Surgeon: Florencio Cara BIRCH, MD;  Location: [facility] INVASIVE CV LAB;  Service: Cardiovascular;  Laterality: N/A;   FACIAL FRACTURE SURGERY  1980   LEFT HEART CATH AND CORONARY ANGIOGRAPHY Left 04/11/2021   Procedure: LEFT HEART CATH AND CORONARY ANGIOGRAPHY;  Surgeon: Fernand Denyse LABOR, MD;  Location: [facility] INVASIVE CV LAB;  Service: Cardiovascular;  Laterality: Left;   LEFT HEART CATH AND CORONARY ANGIOGRAPHY N/A 09/18/2021   Procedure: LEFT HEART CATH AND CORONARY ANGIOGRAPHY;  Surgeon: Florencio Cara BIRCH, MD;  Location: [facility] INVASIVE CV LAB;  Service: Cardiovascular;  Laterality: N/A;   LUMBAR LAMINECTOMY/DECOMPRESSION MICRODISCECTOMY N/A 08/26/2019   Procedure: RIGHT L4-5 MICRODISCECTOMY;  Surgeon: Clois Fret, MD;  Location: [facility] ORS;  Service: Neurosurgery;  Laterality: N/A;   RIGHT/LEFT HEART CATH AND CORONARY ANGIOGRAPHY N/A 01/20/2024   Procedure: RIGHT/LEFT HEART CATH AND CORONARY ANGIOGRAPHY;  Surgeon: Mady Bruckner, MD;  Location: [facility] CV LAB;  Service: Cardiovascular;  Laterality: N/A;    Current Medications: Current Meds  Medication Sig   desonide (DESOWEN) 0.05 % ointment Apply 1 Application topically daily as needed (irritations).  dofetilide  (TIKOSYN ) 125 MCG capsule Take 1 capsule (125 mcg total) by mouth 2 (two) times daily.   doxylamine , Sleep, (UNISOM ) 25 MG tablet Take 25 mg by mouth at bedtime.   ELIQUIS  5 MG TABS tablet Take 1 tablet (5 mg total) by mouth 2 (two) times daily.   empagliflozin  (JARDIANCE ) 10 MG TABS tablet Take 1 tablet (10 mg total) by mouth daily.   ENTRESTO  24-26 MG Take 1 tablet by mouth 2 (two) times daily.   methimazole  (TAPAZOLE ) 10 MG tablet 10 mg in morning, 5 mg in afternoon and 10 mg at night   metoprolol  succinate (TOPROL  XL) 25 MG 24 hr tablet Take 1 tablet (25 mg total) by mouth daily.    Multiple Vitamin (MULTIVITAMIN WITH MINERALS) TABS tablet Take 1 tablet by mouth every morning. Centrum Silver for Men 50+   nitroGLYCERIN  (NITROSTAT ) 0.4 MG SL tablet Place 1 tablet (0.4 mg total) under the tongue every 5 (five) minutes as needed for chest pain.   rosuvastatin  (CRESTOR ) 20 MG tablet Take 20 mg by mouth at bedtime.   sertraline  (ZOLOFT ) 50 MG tablet Take 1 tablet (50 mg total) by mouth at bedtime.   sildenafil  (VIAGRA ) 100 MG tablet Take 100 mg by mouth as needed for erectile dysfunction.   tamsulosin  (FLOMAX ) 0.4 MG CAPS capsule Take 1 capsule (0.4 mg total) by mouth daily.   triamcinolone cream (KENALOG) 0.1 % Apply 1 Application topically daily as needed (irritation).    Allergies:   Amiodarone    Social History   Socioeconomic History   Marital status: Married    Spouse name: Not on file   Number of children: Not on file   Years of education: Not on file   Highest education level: Not on file  Occupational History   Not on file  Tobacco Use   Smoking status: Former    Current packs/day: 0.00    Types: Cigarettes    Quit date: 04/02/1988    Years since quitting: 35.8    Passive exposure: Past   Smokeless tobacco: Never  Vaping Use   Vaping status: Not on file  Substance and Sexual Activity   Alcohol use: No   Drug use: Never   Sexual activity: Not on file  Other Topics Concern   Not on file  Social History Narrative   Not on file   Social Drivers of Health   Financial Resource Strain: Not on file  Food Insecurity: No Food Insecurity (09/23/2023)   Hunger Vital Sign    Worried About Running Out of Food in the Last Year: Never true    Ran Out of Food in the Last Year: Never true  Transportation Needs: No Transportation Needs (09/23/2023)   PRAPARE - Administrator, Civil Service (Medical): No    Lack of Transportation (Non-Medical): No  Physical Activity: Not on file  Stress: Not on file  Social Connections: Moderately Integrated  (09/23/2023)   Social Connection and Isolation Panel    Frequency of Communication with Friends and Family: More than three times a week    Frequency of Social Gatherings with Friends and Family: More than three times a week    Attends Religious Services: More than 4 times per year    Active Member of [employer] or Organizations: No    Attends Banker Meetings: Never    Marital Status: Married     Family History:  The patient's family history includes COPD in his mother; Heart attack in his  father; Valvular heart disease in his sister.  ROS:   12-point review of systems is negative unless otherwise noted in the HPI.   EKGs/Labs/Other Studies Reviewed:    Studies reviewed were summarized above. The additional studies were reviewed today:  R/LHC 01/20/2024: Conclusions: Moderate to severe diagonal disease.  Otherwise, non-obstructive CAD with widely patent mid LAD stent. Normal left and right heart filling pressures. Mildly reduced Fick cardiac output/index.   Recommendations: Secondary prevention of coronary artery disease. Maintain net even fluid balance and escalate goal-directed medical therapy as tolerated. If no evidence of bleeding/vascular injury, apixaban  can be restarted tomorrow morning. __________  2D echo 12/27/2023: 1. Global hypokinesis with apical septal and apical aneurysm. Left  ventricular ejection fraction, by estimation, is 25 to 30%. The left  ventricle has severely decreased function. The left ventricle demonstrates  regional wall motion abnormalities (see  scoring diagram/findings for description). There is mild left ventricular  hypertrophy of the basal-septal segment. Left ventricular diastolic  parameters are consistent with Grade I diastolic dysfunction (impaired  relaxation).   2. Right ventricular systolic function is normal. The right ventricular  size is normal. There is normal pulmonary artery systolic pressure.   3. Left atrial size  was severely dilated.   4. The mitral valve is normal in structure. Mild mitral valve  regurgitation. No evidence of mitral stenosis.   5. The aortic valve is tricuspid. Aortic valve regurgitation is trivial.  No aortic stenosis is present.   6. The inferior vena cava is normal in size with greater than 50%  respiratory variability, suggesting right atrial pressure of 3 mmHg.  __________   TEE 09/24/2023: 1. Left ventricular ejection fraction, by estimation, is <20%. The left  ventricle has severely decreased function.   2. Right ventricular systolic function is severely reduced. The right  ventricular size is normal.   3. Left atrial size was mildly dilated. No left atrial/left atrial  appendage thrombus was detected.   4. The mitral valve is normal in structure. Mild mitral valve  regurgitation.   5. The aortic valve is normal in structure. Aortic valve regurgitation is  not visualized.  __________   2D echo 09/23/2023: 1. Left ventricular ejection fraction, by estimation, is 20 to 25%. The  left ventricle has severely decreased function. The left ventricle  demonstrates global hypokinesis. The left ventricular internal cavity size  was moderately to severely dilated.  Left ventricular diastolic parameters are indeterminate.   2. Right ventricular systolic function is moderately reduced. The right  ventricular size is mildly enlarged. There is mildly elevated pulmonary  artery systolic pressure. The estimated right ventricular systolic  pressure is 42.2 mmHg.   3. The mitral valve is degenerative. Moderate to severe mitral valve  regurgitation. Mild mitral stenosis. Restrictive posterior leafleft,  central MR jet. Recommend repeat echo when better compensated.   4. The aortic valve is tricuspid. There is mild calcification of the  aortic valve. Aortic valve regurgitation is trivial. Aortic valve  sclerosis is present, with no evidence of aortic valve stenosis.   5. The inferior  vena cava is dilated in size with <50% respiratory  variability, suggesting right atrial pressure of 15 mmHg.  __________   2D echo 09/19/2021: 1. Left ventricular ejection fraction, by estimation, is 45 to 50%. The  left ventricle has mildly decreased function. The left ventricle  demonstrates regional wall motion abnormalities (see scoring  diagram/findings for description). There is mild left  ventricular hypertrophy. Left ventricular diastolic parameters are  consistent with Grade I diastolic dysfunction (impaired relaxation). There  is hypokinesis of the left ventricular, mid-apical anterior wall.   2. Right ventricular systolic function is normal. The right ventricular  size is normal.   3. The mitral valve is normal in structure. No evidence of mitral valve  regurgitation. No evidence of mitral stenosis.   4. The aortic valve is normal in structure. Aortic valve regurgitation is  not visualized. No aortic stenosis is present.  __________   [facility] 09/18/2021 A[place]):   Mid LAD-2 lesion is 100% stenosed.   Mid LAD-1 lesion is 70% stenosed.   2nd Diag lesion is 75% stenosed.   Dist RCA lesion is 50% stenosed.   A drug-eluting stent was successfully placed using a STENT ONYX FRONTIER 2.5X22.   Post intervention, there is a 0% residual stenosis.   There is moderate left ventricular systolic dysfunction.   LV end diastolic pressure is mildly elevated.   The left ventricular ejection fraction is 35-45% by visual estimate.   Conclusion Moderately reduced left ventricular function with anterior apical akinesis EF around 35 to 40%   Coronary Left main large relatively free of disease LAD large with a 75% proximal 100% mid TIMI 0 flow Diagonal 2 had a 80% proximal lesion TIMI-3 flow Circumflex large minor irregularities RCA with large minor irregularities with a 50% distal lesion TIMI-3 flow   Intervention Successful PCI and stent of mid LAD with DES stent Onyx frontier 2.5 x 22  mm stent was deployed Postdilated with a Amelia trek Neo at 14 atm Lesion was reduced from 100% down to 0% and TIMI-3 flow was restored from TIMI 0 Minx was deployed in the right groin Patient tolerated procedure well No complications __________   [facility] 04/11/2021 ([facility]):   Mid LAD lesion is 40% stenosed.   The left ventricular systolic function is normal.   LV end diastolic pressure is normal.   There is no aortic valve stenosis.   Mild mid LAD disease, treat medically. __________   2D echo 08/27/2019 ([facility]): 1. Left ventricular ejection fraction, by estimation, is 60 to 65%. The  left ventricle has normal function. The left ventricle has no regional  wall motion abnormalities. There is severe concentric left ventricular  hypertrophy. Left ventricular diastolic   parameters are consistent with Grade I diastolic dysfunction (impaired  relaxation).   2. Right ventricular systolic function is normal. The right ventricular  size is normal. There is normal pulmonary artery systolic pressure.   3. The mitral valve is normal in structure. Trivial mitral valve  regurgitation. No evidence of mitral stenosis.   4. The aortic valve is normal in structure. Aortic valve regurgitation is  not visualized. No aortic stenosis is present.   5. The inferior vena cava is normal in size with greater than 50%  respiratory variability, suggesting right atrial pressure of 3 mmHg.    EKG:  EKG is not ordered today.   Recent Labs: 09/26/2023: ALT 15 11/20/2023: TSH <0.01 12/27/2023: B Natriuretic Peptide 69.4 01/16/2024: BUN 21; Creatinine, Ser 0.69; Magnesium  2.0; Platelets 143 01/20/2024: Hemoglobin 15.3; Potassium 3.9; Sodium 139  Recent Lipid Panel    Component Value Date/Time   CHOL 109 09/17/2021 0907   TRIG 23 09/17/2021 0907   HDL 49 09/17/2021 0907   CHOLHDL 2.2 09/17/2021 0907   VLDL 5 09/17/2021 0907   LDLCALC 55 09/17/2021 0907    PHYSICAL EXAM:    VS:  BP 110/60 (BP Location:  Left Arm, Patient Position: Sitting,  Cuff Size: Normal)   Pulse 64   Ht 6' (1.829 m)   Wt 192 lb 6.4 oz (87.3 kg)   SpO2 98%   BMI 26.09 kg/m   BMI: Body mass index is 26.09 kg/m.  Physical Exam Constitutional:      Appearance: He is well-developed.  HENT:     Head: Normocephalic and atraumatic.  Eyes:     General:        Right eye: No discharge.        Left eye: No discharge.  Cardiovascular:     Rate and Rhythm: Normal rate and regular rhythm.     Pulses:          Posterior tibial pulses are 2+ on the right side and 2+ on the left side.     Heart sounds: Normal heart sounds, S1 normal and S2 normal. Heart sounds not distant. No midsystolic click and no opening snap. No murmur heard.    No friction rub.     Comments: Right radial arteriotomy site is well-healed without active bleeding, bruising, swelling, warmth, erythema, or tenderness to palpation.  Radial pulse 2+ proximal and distal to the arteriotomy site. Pulmonary:     Effort: Pulmonary effort is normal. No respiratory distress.     Breath sounds: Normal breath sounds. No decreased breath sounds, wheezing, rhonchi or rales.  Musculoskeletal:     Cervical back: Normal range of motion.     Right lower leg: No edema.     Left lower leg: No edema.  Skin:    General: Skin is warm and dry.     Nails: There is no clubbing.  Neurological:     Mental Status: He is alert and oriented to person, place, and time.  Psychiatric:        Speech: Speech normal.        Behavior: Behavior normal.        Thought Content: Thought content normal.        Judgment: Judgment normal.     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  After discussion with the EP,  no plans for cardiac MRI prior to device implantation with consideration for MRI post device implantation if cardiomyopathy does not improve with resynchronization therapy.  I have notified the EP team that the patient would like to move forward with device implantation prior to the holidays.  They will let the schedulers know.  He otherwise will continue current GDMT including Toprol -XL 25 mg, Entresto  24/26 mg twice daily, and Jardiance  10 mg.  Not requiring standing loop diuretic.  Consider rechallenge of MRA if BP allows.  Follow-up with advanced heart failure next month as scheduled.  CAD involving the native coronary arteries without angina: He is doing well and without symptoms concerning for angina or cardiac decompensation.  LHC showed nonobstructive CAD with widely patent stent with moderate to severe diagonal disease with medical therapy recommended.  He is on apixaban  in lieu of aspirin .  Continue rosuvastatin  20 mg.  No right radial arteriotomy site complications.  Persistent Afib: Maintaining sinus rhythm by physical exam on Toprol -XL 25 mg daily and dofetilide  125 mcg twice daily.  CHA2DS2-VASc at least 4 (  CHF, HTN, age x 1, vascular disease).  Remains on apixaban  5 mg twice daily.  No falls or symptoms concerning for bleeding.  Recent labs stable.  Ongoing management per EP.  Mitral regurgitation: Mild by echo.  No murmur noted on exam.  HTN: Blood pressure is well-controlled in the office today.  Continue pharmacotherapy as outlined above.  HLD: LDL 60 in 07/2023.  Remains on rosuvastatin  20 mg.  Hyperthyroidism: Most recent TSH remains suppressed with elevated free T3.  Remains on methimazole .  Avoid future amiodarone  use.  Followed by endocrinology.  High-risk medication use: Remains on dofetilide .  Most recent EKG demonstrated known LBBB with stable QTc.  Recent magnesium  and potassium at goal.  Ongoing management and monitoring per EP.       Disposition: F/u with me in 6 months  as well as the advanced heart failure team as scheduled in 03/2024, and EP as directed.    Medication Adjustments/Labs and Tests Ordered: Current medicines are reviewed at length with the patient today.  Concerns regarding medicines are outlined above. Medication changes, Labs and Tests ordered today are summarized above and listed in the Patient Instructions accessible in Encounters.   Signed, Bernardino Bring, PA-C 02/06/2024 12:12 PM     [facility] [place] [address] [place], [place] [address] [phone]

## 2024-02-05 NOTE — Patient Instructions (Signed)
 Medication Instructions:  No medication changes today. *If you need a refill on your cardiac medications before your next appointment, please call your pharmacy*  Lab Work: No labwork ordered today. If you have labs (blood work) drawn today and your tests are completely normal, you will receive your results only by: MyChart Message (if you have MyChart) OR A paper copy in the mail If you have any lab test that is abnormal or we need to change your treatment, we will call you to review the results.  Testing/Procedures: We will call you to schedule your Defibrillator implantation.  Follow-Up: At [facility], you and your health needs are our priority.  As part of our continuing mission to provide you with exceptional heart care, our providers are all part of one team.  This team includes your primary Cardiologist (physician) and Advanced Practice Providers or APPs (Physician Assistants and Nurse Practitioners) who all work together to provide you with the care you need, when you need it.  Your next appointment:   You will follow up with Dr. Inocencio or his team after your procedure.   Provider:   You may see Will Gladis Inocencio, MD or one of the following Advanced Practice Providers on your designated Care Team:   Charlies Arthur, PA-C Elige Shouse Andy Dezmond Downie, PA-C Suzann Riddle, NP Daphne Barrack, NP    We recommend signing up for the patient portal called MyChart.  Sign up information is provided on this After Visit Summary.  MyChart is used to connect with patients for Virtual Visits (Telemedicine).  Patients are able to view lab/test results, encounter notes, upcoming appointments, etc.  Non-urgent messages can be sent to your provider as well.   To learn more about what you can do with MyChart, go to [email or web address].

## 2024-02-06 ENCOUNTER — Ambulatory Visit: Attending: Physician Assistant | Admitting: Physician Assistant

## 2024-02-06 ENCOUNTER — Encounter: Payer: Self-pay | Admitting: Physician Assistant

## 2024-02-06 VITALS — BP 110/60 | HR 64 | Ht 72.0 in | Wt 192.4 lb

## 2024-02-06 DIAGNOSIS — Z79899 Other long term (current) drug therapy: Secondary | ICD-10-CM | POA: Insufficient documentation

## 2024-02-06 DIAGNOSIS — I340 Nonrheumatic mitral (valve) insufficiency: Secondary | ICD-10-CM | POA: Diagnosis present

## 2024-02-06 DIAGNOSIS — I10 Essential (primary) hypertension: Secondary | ICD-10-CM | POA: Diagnosis present

## 2024-02-06 DIAGNOSIS — E785 Hyperlipidemia, unspecified: Secondary | ICD-10-CM | POA: Insufficient documentation

## 2024-02-06 DIAGNOSIS — I2510 Atherosclerotic heart disease of native coronary artery without angina pectoris: Secondary | ICD-10-CM | POA: Diagnosis present

## 2024-02-06 DIAGNOSIS — I4819 Other persistent atrial fibrillation: Secondary | ICD-10-CM | POA: Diagnosis present

## 2024-02-06 DIAGNOSIS — I5020 Unspecified systolic (congestive) heart failure: Secondary | ICD-10-CM | POA: Insufficient documentation

## 2024-02-06 DIAGNOSIS — E0590 Thyrotoxicosis, unspecified without thyrotoxic crisis or storm: Secondary | ICD-10-CM | POA: Diagnosis present

## 2024-02-06 DIAGNOSIS — I447 Left bundle-branch block, unspecified: Secondary | ICD-10-CM | POA: Diagnosis present

## 2024-02-06 NOTE — Patient Instructions (Signed)
 Medication Instructions:  Your physician recommends that you continue on your current medications as directed. Please refer to the Current Medication list given to you today.   *If you need a refill on your cardiac medications before your next appointment, please call your pharmacy*  Lab Work: No labs ordered today  If you have labs (blood work) drawn today and your tests are completely normal, you will receive your results only by: MyChart Message (if you have MyChart) OR A paper copy in the mail If you have any lab test that is abnormal or we need to change your treatment, we will call you to review the results.  Testing/Procedures: No test ordered today   Follow-Up: At [facility], you and your health needs are our priority.  As part of our continuing mission to provide you with exceptional heart care, our providers are all part of one team.  This team includes your primary Cardiologist (physician) and Advanced Practice Providers or APPs (Physician Assistants and Nurse Practitioners) who all work together to provide you with the care you need, when you need it.  Your next appointment:   6 month(s)  Provider:   You may see Bernardino Bring, PA-C  We recommend signing up for the patient portal called MyChart.  Sign up information is provided on this After Visit Summary.  MyChart is used to connect with patients for Virtual Visits (Telemedicine).  Patients are able to view lab/test results, encounter notes, upcoming appointments, etc.  Non-urgent messages can be sent to your provider as well.   To learn more about what you can do with MyChart, go to [email or web address].

## 2024-02-11 ENCOUNTER — Other Ambulatory Visit: Payer: Self-pay

## 2024-02-20 ENCOUNTER — Other Ambulatory Visit

## 2024-02-21 LAB — T3, FREE: T3, Free: 2.3 pg/mL (ref 2.3–4.2)

## 2024-02-21 LAB — T4, FREE: Free T4: 0.5 ng/dL — ABNORMAL LOW (ref 0.8–1.8)

## 2024-02-21 LAB — TSH: TSH: 30.81 m[IU]/L — ABNORMAL HIGH (ref 0.40–4.50)

## 2024-02-24 MED ORDER — ELIQUIS 5 MG PO TABS: 5.0000 mg | ORAL_TABLET | Freq: Two times a day (BID) | ORAL | 3 refills | Status: AC

## 2024-02-25 ENCOUNTER — Encounter: Payer: Self-pay | Admitting: "Endocrinology

## 2024-02-25 ENCOUNTER — Ambulatory Visit (INDEPENDENT_AMBULATORY_CARE_PROVIDER_SITE_OTHER): Admitting: "Endocrinology

## 2024-02-25 VITALS — BP 122/80 | HR 67 | Ht 72.0 in | Wt 197.0 lb

## 2024-02-25 DIAGNOSIS — E0590 Thyrotoxicosis, unspecified without thyrotoxic crisis or storm: Secondary | ICD-10-CM | POA: Diagnosis not present

## 2024-02-25 MED ORDER — METHIMAZOLE 10 MG PO TABS: ORAL_TABLET | ORAL | 1 refills | Status: AC

## 2024-02-25 NOTE — Progress Notes (Signed)
 Outpatient Endocrinology Note Tommy Birmingham, MD  02/25/24   Tommy Rowe [birth year] [id number]  Referring Provider: Darra Hamilton, PA-C Primary Care Provider: Darra Hamilton, PA-C Subjective  No chief complaint on file.   Assessment & Plan  Diagnoses and all orders for this visit:  Hyperthyroidism  Other orders -     methimazole  (TAPAZOLE ) 10 MG tablet; 10 mg in morning and 10 mg at night   Tommy Rowe is currently taking methimazole  10 mg in morning, 5 mg in afternoon and 10 mg at night Patient is biochemically hypothyroid.  Discussed the etiology for hyperthyroidism. Educated on thyroid  axis.  02/25/24: Recommend the following: hold for 3 day, then start methimazole  10 mg in morning and 10 mg at night. Repeat labs in 3 months or sooner if symptoms of hyper or hypothyroidism develop.  -complications of untreated hyperthyroidism including atrial fibrillation, heart failure and osteoporosis -side effects of Methimazole  including but not limited to allergic reaction, rash, bone marrow suppression, liver dysfunction -compliance and follow up needs    If you notice any symptoms of worsening fatigue, fever with sore throat, loss of appetite, yellowing of eyes, dark urine, joint pains, sores in the mouth, itchy rash, light colored stools or abdominal pain, please stop the medication and call us  immediately as this can be a serious side effect of the medication.  09/23/23 THYROID  ULTRASOUND images and report reviewed, within normal limits No follow-up indicated unless clinically warranted  I have reviewed current medications, nurse's notes, allergies, vital signs, past medical and surgical history, family medical history, and social history for this encounter. Counseled patient on symptoms, examination findings, lab findings, imaging results, treatment decisions and monitoring and prognosis. The patient understood the recommendations and agrees with the treatment plan. All  questions regarding treatment plan were fully answered.  Return in about 2 months (around 04/26/2024) for visit + labs before next visit.   Tommy Birmingham, MD  02/25/24   I have reviewed current medications, nurse's notes, allergies, vital signs, past medical and surgical history, family medical history, and social history for this encounter. Counseled patient on symptoms, examination findings, lab findings, imaging results, treatment decisions and monitoring and prognosis. The patient understood the recommendations and agrees with the treatment plan. All questions regarding treatment plan were fully answered.   History of Present Illness Tommy Rowe is a 68 y.o. year old male who presents to our clinic with hyperthyroidism diagnosed in 2025.    Was in hospital for Atrial fibrillation s/p cardioversion and amiodarone  use, leading to thyroid  storm. He finally had atrial ablation. He is no longer on amiodarone .  He is currently taking methimazole  5 mg tid.  Symptoms suggestive of HYPOTHYROIDISM:  fatigue Yes, occasionally  weight gain Yes cold intolerance  Yes constipation  No  Symptoms suggestive of HYPERTHYROIDISM:  weight loss  No heat intolerance No hyperdefecation  No palpitations  No  Compressive symptoms:  dysphagia  No dysphonia  No positional dyspnea (especially with simultaneous arms elevation)  No  Smokes  No On biotin  No Personal history of head/neck surgery/irradiation  No  Adverse Drug Effects from Methimazole  (MMI): rash No fever No throat pain No arthritis No mouth ulcers No jaundice No loss of appetite No lymphadenopathy No  Grave's Ophthalmopathy Clinical Activity Score: 0/9  09/23/23 THYROID  ULTRASOUND   TECHNIQUE: Ultrasound examination of the thyroid  gland and adjacent soft tissues was performed.   COMPARISON:  None Available.   FINDINGS: Parenchymal Echotexture: Normal   Isthmus:  0.5 cm   Right lobe: 4.6 x 2.4 x 1.8 cm   Left  lobe: 5.2 x 1.5 x 1.8 cm   _________________________________________________________   Estimated total number of nodules >/= 1 cm: 0   Number of spongiform nodules >/=  2 cm not described below (TR1): 0   Number of mixed cystic and solid nodules >/= 1.5 cm not described below (TR2): 0   _________________________________________________________   No discrete nodules are seen within the thyroid  gland. No enlarged or abnormal appearing lymph nodes are identified.   IMPRESSION: Normal thyroid  ultrasound.  Physical Exam  BP 122/80   Pulse 67   Ht 6' (1.829 m)   Wt 197 lb (89.4 kg)   SpO2 98%   BMI 26.72 kg/m  Constitutional: well developed, well nourished Head: normocephalic, atraumatic, no exophthalmos Eyes: sclera anicteric, no redness Neck: no thyromegaly, no thyroid  tenderness; no nodules palpated Lungs: normal respiratory effort Neurology: alert and oriented, no fine hand tremor Skin: dry, no appreciable rashes Musculoskeletal: no appreciable defects Psychiatric: normal mood and affect  Allergies Allergies  Allergen Reactions   Amiodarone  Other (See Comments)    History of Graves Disease    Current Medications Patient's Medications  New Prescriptions   No medications on file  Previous Medications   DESONIDE (DESOWEN) 0.05 % OINTMENT    Apply 1 Application topically daily as needed (irritations).   DOFETILIDE  (TIKOSYN ) 125 MCG CAPSULE    Take 1 capsule (125 mcg total) by mouth 2 (two) times daily.   DOXYLAMINE , SLEEP, (UNISOM ) 25 MG TABLET    Take 25 mg by mouth at bedtime.   ELIQUIS  5 MG TABS TABLET    Take 1 tablet (5 mg total) by mouth 2 (two) times daily.   EMPAGLIFLOZIN  (JARDIANCE ) 10 MG TABS TABLET    Take 1 tablet (10 mg total) by mouth daily.   ENTRESTO  24-26 MG    Take 1 tablet by mouth 2 (two) times daily.   METOPROLOL  SUCCINATE (TOPROL  XL) 25 MG 24 HR TABLET    Take 1 tablet (25 mg total) by mouth daily.   MULTIPLE VITAMIN (MULTIVITAMIN WITH  MINERALS) TABS TABLET    Take 1 tablet by mouth every morning. Centrum Silver for Men 50+   NITROGLYCERIN  (NITROSTAT ) 0.4 MG SL TABLET    Place 1 tablet (0.4 mg total) under the tongue every 5 (five) minutes as needed for chest pain.   ROSUVASTATIN  (CRESTOR ) 20 MG TABLET    Take 20 mg by mouth at bedtime.   SERTRALINE  (ZOLOFT ) 50 MG TABLET    Take 1 tablet (50 mg total) by mouth at bedtime.   SILDENAFIL  (VIAGRA ) 100 MG TABLET    Take 100 mg by mouth as needed for erectile dysfunction.   TAMSULOSIN  (FLOMAX ) 0.4 MG CAPS CAPSULE    Take 1 capsule (0.4 mg total) by mouth daily.   TRIAMCINOLONE CREAM (KENALOG) 0.1 %    Apply 1 Application topically daily as needed (irritation).  Modified Medications   Modified Medication Previous Medication   METHIMAZOLE  (TAPAZOLE ) 10 MG TABLET methimazole  (TAPAZOLE ) 10 MG tablet      10 mg in morning and 10 mg at night    10 mg in morning, 5 mg in afternoon and 10 mg at night  Discontinued Medications   No medications on file    Past Medical History Past Medical History:  Diagnosis Date   A-fib (HCC)    Anxiety    CAD (coronary artery disease)    HFrEF (heart  failure with reduced ejection fraction) (HCC)    HLD (hyperlipidemia)    HTN (hypertension)    Hyperthyroidism    LBBB (left bundle branch block)     Past Surgical History Past Surgical History:  Procedure Laterality Date   A-FLUTTER ABLATION N/A 10/03/2023   Procedure: A-FLUTTER ABLATION;  Surgeon: Inocencio Soyla Lunger, MD;  Location: [facility];  Service: Cardiovascular;  Laterality: N/A;   ATRIAL FIBRILLATION ABLATION N/A 10/03/2023   Procedure: ATRIAL FIBRILLATION ABLATION;  Surgeon: Inocencio Soyla Lunger, MD;  Location: [facility];  Service: Cardiovascular;  Laterality: N/A;   CORONARY STENT INTERVENTION N/A 09/18/2021   Procedure: CORONARY STENT INTERVENTION;  Surgeon: Florencio Cara BIRCH, MD;  Location: [facility];  Service: Cardiovascular;  Laterality: N/A;   FACIAL  FRACTURE SURGERY  1980   LEFT HEART CATH AND CORONARY ANGIOGRAPHY Left 04/11/2021   Procedure: LEFT HEART CATH AND CORONARY ANGIOGRAPHY;  Surgeon: Fernand Denyse LABOR, MD;  Location: [facility];  Service: Cardiovascular;  Laterality: Left;   LEFT HEART CATH AND CORONARY ANGIOGRAPHY N/A 09/18/2021   Procedure: LEFT HEART CATH AND CORONARY ANGIOGRAPHY;  Surgeon: Florencio Cara BIRCH, MD;  Location: [facility];  Service: Cardiovascular;  Laterality: N/A;   LUMBAR LAMINECTOMY/DECOMPRESSION MICRODISCECTOMY N/A 08/26/2019   Procedure: RIGHT L4-5 MICRODISCECTOMY;  Surgeon: Clois Fret, MD;  Location: [facility];  Service: Neurosurgery;  Laterality: N/A;   RIGHT/LEFT HEART CATH AND CORONARY ANGIOGRAPHY N/A 01/20/2024   Procedure: RIGHT/LEFT HEART CATH AND CORONARY ANGIOGRAPHY;  Surgeon: Mady Bruckner, MD;  Location: [facility];  Service: Cardiovascular;  Laterality: N/A;    Family History family history includes COPD in his mother; Heart attack in his father; Valvular heart disease in his sister.  Social History Social History   Socioeconomic History   Marital status: Married    Spouse name: Not on file   Number of children: Not on file   Years of education: Not on file   Highest education level: Not on file  Occupational History   Not on file  Tobacco Use   Smoking status: Former    Current packs/day: 0.00    Types: Cigarettes    Quit date: 04/02/1988    Years since quitting: 35.9    Passive exposure: Past   Smokeless tobacco: Never  Vaping Use   Vaping status: Not on file  Substance and Sexual Activity   Alcohol use: No   Drug use: Never   Sexual activity: Not on file  Other Topics Concern   Not on file  Social History Narrative   Not on file   Social Drivers of Health   Financial Resource Strain: Not on file  Food Insecurity: No Food Insecurity (09/23/2023)   Hunger Vital Sign    Worried About Running Out of Food in the Last Year: Never true    Ran  Out of Food in the Last Year: Never true  Transportation Needs: No Transportation Needs (09/23/2023)   PRAPARE - Administrator, Civil Service (Medical): No    Lack of Transportation (Non-Medical): No  Physical Activity: Not on file  Stress: Not on file  Social Connections: Moderately Integrated (09/23/2023)   Social Connection and Isolation Panel    Frequency of Communication with Friends and Family: More than three times a week    Frequency of Social Gatherings with Friends and Family: More than three times a week    Attends Religious Services: More than 4 times per  year    Active Member of Clubs or Organizations: No    Attends Banker Meetings: Never    Marital Status: Married  Catering Manager Violence: Not At Risk (09/23/2023)   Humiliation, Afraid, Rape, and Kick questionnaire    Fear of Current or Ex-Partner: No    Emotionally Abused: No    Physically Abused: No    Sexually Abused: No    Laboratory Investigations Lab Results  Component Value Date   TSH 30.81 (H) 02/20/2024   TSH <0.01 (L) 11/20/2023   TSH 0.01 (L) 10/10/2023   FREET4 0.5 (L) 02/20/2024   FREET4 1.6 11/20/2023   FREET4 2.2 (H) 10/10/2023     Lab Results  Component Value Date   TSI 102 10/10/2023     No components found for: TRAB   Lab Results  Component Value Date   CHOL 109 09/17/2021   Lab Results  Component Value Date   HDL 49 09/17/2021   Lab Results  Component Value Date   LDLCALC 55 09/17/2021   Lab Results  Component Value Date   TRIG 23 09/17/2021   Lab Results  Component Value Date   CHOLHDL 2.2 09/17/2021   Lab Results  Component Value Date   CREATININE 0.69 (L) 01/16/2024   No results found for: GFR    Component Value Date/Time   NA 139 01/20/2024 1118   NA 137 01/16/2024 1449   K 3.9 01/20/2024 1118   CL 101 01/16/2024 1449   CO2 22 01/16/2024 1449   GLUCOSE 80 01/16/2024 1449   GLUCOSE 100 (H) 12/27/2023 1516   BUN 21 01/16/2024 1449    CREATININE 0.69 (L) 01/16/2024 1449   CALCIUM  8.9 01/16/2024 1449   PROT 4.9 (L) 09/26/2023 0323   ALBUMIN 2.9 (L) 09/26/2023 0323   AST 12 (L) 09/26/2023 0323   ALT 15 09/26/2023 0323   ALKPHOS 36 (L) 09/26/2023 0323   BILITOT 0.7 09/26/2023 0323   GFRNONAA >60 12/27/2023 1516   GFRAA >60 08/21/2019 1017      Latest Ref Rng & Units 01/20/2024   11:18 AM 01/20/2024   11:17 AM 01/16/2024    2:49 PM  BMP  Glucose 70 - 99 mg/dL   80   BUN 8 - 27 mg/dL   21   Creatinine 9.23 - 1.27 mg/dL   9.30   BUN/Creat Ratio 10 - 24   30   Sodium 135 - 145 mmol/L 139  139  137   Potassium 3.5 - 5.1 mmol/L 3.9  3.9  4.2   Chloride 96 - 106 mmol/L   101   CO2 20 - 29 mmol/L   22   Calcium  8.6 - 10.2 mg/dL   8.9        Component Value Date/Time   WBC 4.5 01/16/2024 1449   WBC 6.4 10/01/2023 0500   RBC 4.93 01/16/2024 1449   RBC 4.64 10/01/2023 0500   HGB 15.3 01/20/2024 1118   HGB 15.7 01/16/2024 1449   HCT 45.0 01/20/2024 1118   HCT 46.5 01/16/2024 1449   PLT 143 (L) 01/16/2024 1449   MCV 94 01/16/2024 1449   MCH 31.8 01/16/2024 1449   MCH 31.5 10/01/2023 0500   MCHC 33.8 01/16/2024 1449   MCHC 34.0 10/01/2023 0500   RDW 13.1 01/16/2024 1449   LYMPHSABS 1.4 09/18/2023 1700   MONOABS 0.5 09/18/2023 1700   EOSABS 0.0 09/18/2023 1700   BASOSABS 0.0 09/18/2023 1700      Parts of this  note may have been dictated using voice recognition software. There may be variances in spelling and vocabulary which are unintentional. Not all errors are proofread. Please notify the dino if any discrepancies are noted or if the meaning of any statement is not clear.

## 2024-02-25 NOTE — Patient Instructions (Signed)
  If you notice any symptoms of worsening fatigue, fever with sore throat, loss of appetite, yellowing of eyes, dark urine, joint pains, sores in the mouth, itchy rash, light colored stools or abdominal pain, please stop the medication and call us immediately as this can be a serious side effect of the medication.

## 2024-03-03 ENCOUNTER — Other Ambulatory Visit: Payer: Self-pay

## 2024-03-03 DIAGNOSIS — I5020 Unspecified systolic (congestive) heart failure: Secondary | ICD-10-CM

## 2024-03-12 ENCOUNTER — Encounter (HOSPITAL_COMMUNITY): Payer: Self-pay

## 2024-03-12 ENCOUNTER — Telehealth (HOSPITAL_COMMUNITY): Payer: Self-pay

## 2024-03-12 NOTE — Telephone Encounter (Signed)
 Attempted to reach patient to discuss upcoming procedure, no answer. Left VM for patient to return call.

## 2024-03-13 NOTE — Telephone Encounter (Signed)
 Spoke with patient to complete pre-procedure call.     Health status review:  Any new medical conditions, recent signs of acute illness or been started on antibiotics? No Any recent hospitalizations or surgeries? No Any new medications started since pre-op visit? No  Follow all medication instructions prior to procedure or the procedure may be rescheduled:    HOLD: Empagliflozin  (Jardiance ) for 3 days prior to the procedure. Last dose on Monday, December 29.  HOLD: Eliquis  (Apixaban ) for 2 day(s) prior to your procedure. Your last dose will be Tuesday, December 30, PM dose.  NO eating or drinking after midnight (NPO after 12:00am) except for sips of water  with your medications not discussed.  The night before your procedure and the morning of your procedure, wash thoroughly with the CHG surgical soap from the neck down, paying special attention to the area where your procedure will be performed.  Pre-procedure testing scheduled: lab work on December 19.  Confirmed patient is scheduled for Biventricular implantable cardioverter defibrillator on Friday, January 2 with Dr. Inocencio. Instructed patient to arrive at the Main Entrance A at [facility]: [address] [place], [place] [address] and check in at Admitting at 11:30 AM.  Plan to go home the same day, you will only stay overnight if medically necessary. You MUST have a responsible adult to drive you home and MUST be with you the first 24 hours after you arrive home or your procedure could be cancelled.  Informed a nurse may call a day before the procedure to confirm arrival time and ensure instructions are followed.  Patient verbalized understanding to information provided and is agreeable to proceed with procedure.   Advised to contact RN Navigator at [phone], to inform of any new medications started after call or concerns prior to procedure.

## 2024-03-18 NOTE — Progress Notes (Signed)
° °  ADVANCED HEART FAILURE CLINIC NOTE  Referring Physician: Darra Hamilton, PA-C  Primary Care: Darra Hamilton, PA-C Primary Cardiologist: Heart Failure: Ria Commander, DO  CC: HFREF  HPI: Tommy Rowe is a 68 y.o. 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   Pertinent Family & social hx: No significant pertinent history  Cardiac History:  - PCI to the LAD 08/2021 - Seen inpatient in 6/25 by Dr. Kennyth for atrial fibrillation with plan for outpatient cardioversion.  - Admitted to [facility] in 7/25 with cardiogenic shock 2/2 hyperthyroid induced atrial fibrillation. TTE w/ severely reduced LV function. Started on methimazole  with eventual conversion to sinus rhythm with Tikosyn  load.  - Afib/AFL ablation by Dr. Inocencio on 10/03/23   Interval hx:  - Doing very well from a functional standpoint; his only limitations are secondary to lightheadedness/dizziness. His HR today is 63;    PHYSICAL EXAM: There were no vitals filed for this visit.  GENERAL: NAD Lungs- normal work of breathing, CTA CARDIAC:  JVP: 7 cm          Normal rate with regular rhythm. no murmur.  Pulses 2+. No edema.  ABDOMEN: Soft, non-tender, non-distended.  EXTREMITIES: Warm and well perfused.  NEUROLOGIC: No obvious FND  DATA REVIEW  ECG: 10/10/23: sinus bradycardia with LBBB  As per my personal interpretation  ECHO: 12/27/23 09/23/23: LVEF 20-25%, moderately reduced RV function 09/19/21: LVEF 45-50%  CATH: 6/23:    Mid LAD-2 lesion is 100% stenosed.   Mid LAD-1 lesion is 70% stenosed.   2nd Diag lesion is 75% stenosed.   Dist RCA lesion is 50% stenosed.   A drug-eluting stent was successfully placed using a STENT ONYX FRONTIER 2.5X22.   Post intervention, there is a 0% residual stenosis.   There is moderate left ventricular systolic dysfunction.   LV end diastolic pressure is mildly elevated.   The  left ventricular ejection fraction is 35-45% by visual estimate.    ASSESSMENT & PLAN:  Heart Failure with reduced ejection fraction - Etiology & History: predominantly nonischemic 2/2 atrial fibrillation - NYHA Class: II - Volume status: euvolemic - GDMT:  -  RAASi: continue Entresto  24/26mg  BID.  Beta-blocker: bradycardic to 50s, decrease toprol  to 25mg .  -  SGLT2i: jardiance  10mg   - ICD: not indicated; repeat TTE at follow up - Advanced therapies: not indicated  2. Atrial fibrillation  - Difficult to control atrial fibrillation secondary to thyroid  storm  - Continue tikosyn  125mcg - s/p AF/AFL ablation  - sinus bradycardia; decrease toprol  25mg  - Apixaban  5mg  BID  3. Hyperthyroidism  - T3 is now back up to 5 on 10/10/23, T4 2.2 from 1.37.  - Methimazole  5mg  TID - Established care with endocrinology  4. CAD - s/p PCI to the LAD in 2023 - ASA + apixaban   I spent 35 minutes caring for this patient today including face to face time, ordering and reviewing labs, reviewing echocardiogram with , seeing the patient, documenting in the record, and arranging follow ups.    Aditya Sabharwal Advanced Heart Failure Mechanical Circulatory Support

## 2024-03-19 ENCOUNTER — Telehealth (HOSPITAL_COMMUNITY): Payer: Self-pay

## 2024-03-19 NOTE — Telephone Encounter (Signed)
 Called to confirm/remind patient of their appointment at the [facility] Failure Clinic on 03/20/24.   Appointment:   [x] Confirmed  [] Left mess   [] No answer/No voice mail  [] VM Full/unable to leave message  [] Phone not in service  Patient reminded to bring all medications and/or complete list.  Confirmed patient has transportation. Gave directions, instructed to utilize valet parking.

## 2024-03-20 ENCOUNTER — Ambulatory Visit (HOSPITAL_COMMUNITY)
Admission: RE | Admit: 2024-03-20 | Discharge: 2024-03-20 | Disposition: A | Discharge status: Home / Self Care | Source: Ambulatory Visit | Attending: Family Medicine | Admitting: Family Medicine

## 2024-03-20 ENCOUNTER — Ambulatory Visit (HOSPITAL_COMMUNITY): Payer: Self-pay | Admitting: Family Medicine

## 2024-03-20 ENCOUNTER — Encounter (HOSPITAL_COMMUNITY): Payer: Self-pay

## 2024-03-20 VITALS — BP 110/70 | HR 62 | Wt 198.0 lb

## 2024-03-20 DIAGNOSIS — Z955 Presence of coronary angioplasty implant and graft: Secondary | ICD-10-CM | POA: Insufficient documentation

## 2024-03-20 DIAGNOSIS — I110 Hypertensive heart disease with heart failure: Secondary | ICD-10-CM | POA: Diagnosis present

## 2024-03-20 DIAGNOSIS — I4891 Unspecified atrial fibrillation: Secondary | ICD-10-CM | POA: Diagnosis not present

## 2024-03-20 DIAGNOSIS — I5022 Chronic systolic (congestive) heart failure: Secondary | ICD-10-CM | POA: Insufficient documentation

## 2024-03-20 DIAGNOSIS — Z79899 Other long term (current) drug therapy: Secondary | ICD-10-CM | POA: Diagnosis not present

## 2024-03-20 DIAGNOSIS — I2510 Atherosclerotic heart disease of native coronary artery without angina pectoris: Secondary | ICD-10-CM | POA: Diagnosis not present

## 2024-03-20 DIAGNOSIS — Z7984 Long term (current) use of oral hypoglycemic drugs: Secondary | ICD-10-CM | POA: Insufficient documentation

## 2024-03-20 DIAGNOSIS — Z7901 Long term (current) use of anticoagulants: Secondary | ICD-10-CM | POA: Diagnosis not present

## 2024-03-20 DIAGNOSIS — I447 Left bundle-branch block, unspecified: Secondary | ICD-10-CM

## 2024-03-20 DIAGNOSIS — I4819 Other persistent atrial fibrillation: Secondary | ICD-10-CM | POA: Diagnosis not present

## 2024-03-20 DIAGNOSIS — E785 Hyperlipidemia, unspecified: Secondary | ICD-10-CM | POA: Diagnosis not present

## 2024-03-20 DIAGNOSIS — E0590 Thyrotoxicosis, unspecified without thyrotoxic crisis or storm: Secondary | ICD-10-CM | POA: Diagnosis not present

## 2024-03-20 LAB — BASIC METABOLIC PANEL WITH GFR
Anion gap: 7 (ref 5–15)
BUN: 29 mg/dL — ABNORMAL HIGH (ref 8–23)
CO2: 26 mmol/L (ref 22–32)
Calcium: 9.1 mg/dL (ref 8.9–10.3)
Chloride: 104 mmol/L (ref 98–111)
Creatinine, Ser: 0.78 mg/dL (ref 0.61–1.24)
GFR, Estimated: >60 mL/min
Glucose, Bld: 105 mg/dL — ABNORMAL HIGH (ref 70–99)
Potassium: 5 mmol/L (ref 3.5–5.1)
Sodium: 138 mmol/L (ref 135–145)

## 2024-03-20 LAB — CBC
HCT: 47 % (ref 39.0–52.0)
Hemoglobin: 16.1 g/dL (ref 13.0–17.0)
MCH: 32.7 pg (ref 26.0–34.0)
MCHC: 34.3 g/dL (ref 30.0–36.0)
MCV: 95.5 fL (ref 80.0–100.0)
Platelets: 147 K/uL — ABNORMAL LOW (ref 150–400)
RBC: 4.92 MIL/uL (ref 4.22–5.81)
RDW: 13.6 % (ref 11.5–15.5)
WBC: 4.6 K/uL (ref 4.0–10.5)
nRBC: 0 % (ref 0.0–0.2)

## 2024-03-20 NOTE — Patient Instructions (Signed)
 There has been no changes to your medications.  Labs done today, your results will be available in MyChart, we will contact you for abnormal readings.  Your physician recommends that you schedule a follow-up appointment in: 4 months ( April 2026) ** PLEASE CALL THE OFFICE IN FEBRUARY TO ARRANGE YOUR FOLLOW UP APPOINTMENT.**  If you have any questions or concerns before your next appointment please send us  a message through [place] or call our office at [phone].    TO LEAVE A MESSAGE FOR THE NURSE SELECT OPTION 2, PLEASE LEAVE A MESSAGE INCLUDING: YOUR NAME DATE OF BIRTH CALL BACK NUMBER REASON FOR CALL**this is important as we prioritize the call backs  YOU WILL RECEIVE A CALL BACK THE SAME DAY AS LONG AS YOU CALL BEFORE 4:00 PM  At the [facility], you and your health needs are our priority. As part of our continuing mission to provide you with exceptional heart care, we have created designated Provider Care Teams. These Care Teams include your primary Cardiologist (physician) and Advanced Practice Providers (APPs- Physician Assistants and Nurse Practitioners) who all work together to provide you with the care you need, when you need it.   You may see any of the following providers on your designated Care Team at your next follow up: Dr Toribio Fuel Dr Ezra Shuck Dr. Morene Brownie Greig Mosses, NP Caffie Shed, [place] [facility] [place], [place] Beckey Coe, NP Jordan Lee, NP Ellouise Class, NP Tinnie Redman, PharmD Jaun Bash, PharmD   Please be sure to bring in all your medications bottles to every appointment.    Thank you for choosing [facility]

## 2024-03-20 NOTE — Telephone Encounter (Signed)
 Received a message from [facility], FNP with AHF clinic, regarding pre-ICD labs showing PLTs are a bit low, and just wanted to give a heads up in case it needed to be addressed by Dr. Inocencio before ICD placement. I advised, labs look stable compared to October results.   Informed Dr. Inocencio of this information. Provider stated, Thanks and I agree. Ok for procedure.

## 2024-04-01 NOTE — Pre-Procedure Instructions (Signed)
 Instructed patient on the following items: Arrival time 1130 Nothing to eat or drink after midnight No meds AM of procedure Responsible person to drive you home and stay with you for 24 hrs Wash with special soap night before and morning of procedure If on anti-coagulant drug instructions Eliquis - last dose 12/30

## 2024-04-03 ENCOUNTER — Other Ambulatory Visit: Payer: Self-pay

## 2024-04-03 ENCOUNTER — Ambulatory Visit (HOSPITAL_COMMUNITY)

## 2024-04-03 ENCOUNTER — Ambulatory Visit (HOSPITAL_COMMUNITY)
Admission: RE | Disposition: A | Discharge status: Home / Self Care | Payer: Self-pay | Source: Home / Self Care | Attending: Cardiology

## 2024-04-03 ENCOUNTER — Ambulatory Visit (HOSPITAL_COMMUNITY)
Admission: RE | Admit: 2024-04-03 | Discharge: 2024-04-03 | Disposition: A | Discharge status: Home / Self Care | Attending: Cardiology | Admitting: Cardiology

## 2024-04-03 DIAGNOSIS — I428 Other cardiomyopathies: Secondary | ICD-10-CM

## 2024-04-03 DIAGNOSIS — I110 Hypertensive heart disease with heart failure: Secondary | ICD-10-CM | POA: Insufficient documentation

## 2024-04-03 DIAGNOSIS — I447 Left bundle-branch block, unspecified: Secondary | ICD-10-CM | POA: Diagnosis not present

## 2024-04-03 DIAGNOSIS — I5022 Chronic systolic (congestive) heart failure: Secondary | ICD-10-CM

## 2024-04-03 HISTORY — PX: LEAD INSERTION: EP1212

## 2024-04-03 HISTORY — PX: BIV ICD INSERTION CRT-D: EP1195

## 2024-04-03 MED ORDER — CHLORHEXIDINE GLUCONATE 4 % EX SOLN: 4.0000 | Freq: Once | CUTANEOUS | Status: DC

## 2024-04-03 MED ORDER — LIDOCAINE HCL (PF) 1 % IJ SOLN
INTRAMUSCULAR | Status: AC
  Filled 2024-04-03: qty 60

## 2024-04-03 MED ORDER — MIDAZOLAM HCL 2 MG/2ML IJ SOLN
INTRAMUSCULAR | Status: AC
  Filled 2024-04-03: qty 2

## 2024-04-03 MED ORDER — CEFAZOLIN SODIUM-DEXTROSE 2-4 GM/100ML-% IV SOLN
INTRAVENOUS | Status: AC
  Administered 2024-04-03: 2 g via INTRAVENOUS
  Filled 2024-04-03: qty 100

## 2024-04-03 MED ORDER — FENTANYL CITRATE (PF) 100 MCG/2ML IJ SOLN
INTRAMUSCULAR | Status: DC | PRN
  Administered 2024-04-03 (×3): 25 ug via INTRAVENOUS

## 2024-04-03 MED ORDER — SODIUM CHLORIDE 0.9 % IV SOLN
INTRAVENOUS | Status: AC
  Administered 2024-04-03: 80 mg
  Filled 2024-04-03: qty 2

## 2024-04-03 MED ORDER — MIDAZOLAM HCL 5 MG/5ML IJ SOLN
INTRAMUSCULAR | Status: DC | PRN
  Administered 2024-04-03 (×3): 1 mg via INTRAVENOUS

## 2024-04-03 MED ORDER — CEFAZOLIN SODIUM-DEXTROSE 2-4 GM/100ML-% IV SOLN: 2.0000 g | INTRAVENOUS | Status: AC

## 2024-04-03 MED ORDER — FENTANYL CITRATE (PF) 100 MCG/2ML IJ SOLN
INTRAMUSCULAR | Status: AC
  Filled 2024-04-03: qty 2

## 2024-04-03 MED ORDER — HEPARIN (PORCINE) IN NACL 1000-0.9 UT/500ML-% IV SOLN
INTRAVENOUS | Status: DC | PRN
  Administered 2024-04-03: 500 mL

## 2024-04-03 MED ORDER — IOHEXOL 350 MG/ML SOLN
INTRAVENOUS | Status: DC | PRN
  Administered 2024-04-03 (×2): 10 mL
  Administered 2024-04-03: 5 mL
  Administered 2024-04-03: 10 mL

## 2024-04-03 MED ORDER — ACETAMINOPHEN 325 MG PO TABS: 325.0000 mg | ORAL_TABLET | ORAL | Status: DC | PRN

## 2024-04-03 MED ORDER — ONDANSETRON HCL 4 MG/2ML IJ SOLN: 4.0000 mg | Freq: Four times a day (QID) | INTRAMUSCULAR | Status: DC | PRN

## 2024-04-03 MED ORDER — SODIUM CHLORIDE 0.9 % IV SOLN: INTRAVENOUS | Status: DC

## 2024-04-03 MED ORDER — POVIDONE-IODINE 10 % EX SWAB
2.0000 | Freq: Once | CUTANEOUS | Status: AC
  Administered 2024-04-03: 2 via TOPICAL

## 2024-04-03 MED ORDER — LIDOCAINE HCL (PF) 1 % IJ SOLN
INTRAMUSCULAR | Status: DC | PRN
  Administered 2024-04-03: 50 mL

## 2024-04-03 MED ORDER — SODIUM CHLORIDE 0.9 % IV SOLN: 80.0000 mg | INTRAVENOUS | Status: AC

## 2024-04-03 NOTE — Progress Notes (Signed)
 Verbal order from Dr. Inocencio. Patient to restart Eliquis  Monday night, 04/06/2024. Restart Tikosyn  tomorrow morning,04/04/2024. Read back and verified. Patient and wife notified.

## 2024-04-03 NOTE — Discharge Instructions (Signed)
 After Your ICD (Implantable Cardiac Defibrillator)   You have a Medtronic ICD  If you have a Medtronic or Biotronik device, plug in your home monitor once you get home, and no manual interaction is required.   If you have an [employer] or [employer] device, plug your home monitor once you get home, sit near the device, and press the large activation button. Sit nearby until the process is complete, usually notated by lights on the monitor.   If you were set up for monitoring using an app on your phone, make sure the app remains open in the background and the Bluetooth remains on.  ACTIVITY Do not lift your arm above shoulder height for 1 week after your procedure. After 7 days, you may progress as below.  You should remove your sling 24 hours after your procedure, unless otherwise instructed by your provider.     Friday April 10, 2024  Saturday April 11, 2024 Sunday April 12, 2024 Monday April 13, 2024   Do not lift, push, pull, or carry anything over 10 pounds with the affected arm until 6 weeks (Friday May 15, 2024 ) after your procedure.   You may drive AFTER your wound check, UNLESS you have been told otherwise by your provider.   Ask your healthcare provider when you can go back to work   INCISION/Dressing If you are on a blood thinner such as Coumadin, Xarelto, Eliquis, Plavix, or Pradaxa please confirm with your provider when this should be resumed.   If large square, outer bandage is left in place, this can be removed after 24 hours from your procedure. Do not remove steri-strips or glue as below.   Monitor your defibrillator site for redness, swelling, and drainage. Call the device clinic at [phone] if you experience these symptoms or fever/chills.    If your incision is sealed with Steri-strips or staples, you may shower 7 days after your procedure or when told by your provider. Do not remove the steri-strips or let the shower hit directly on your site.  You may wash around your site with soap and water.    If you were discharged in a sling, please do not wear this during the day more than 48 hours after your surgery unless otherwise instructed. This may increase the risk of stiffness and soreness in your shoulder.   Avoid lotions, ointments, or perfumes over your incision until it is well-healed.  You may use a hot tub or a pool AFTER your wound check appointment if the incision is completely closed.  Your ICD is designed to protect you from life threatening heart rhythms. Because of this, you may receive a shock.   1 shock with no symptoms:  Call the office during business hours. 1 shock with symptoms (chest pain, chest pressure, dizziness, lightheadedness, shortness of breath, overall feeling unwell):  Call 911. If you experience 2 or more shocks in 24 hours:  Call 911. If you receive a shock, you should not drive for 6 months per the [place] DMV IF you receive appropriate therapy from your ICD.   ICD Alerts:  Some alerts are vibratory and others beep. These are NOT emergencies. Please call our office to let us  know. If this occurs at night or on weekends, it can wait until the next business day. Send a remote transmission.  If your device is capable of reading fluid status (for heart failure), you will be offered monthly monitoring to review this with you.   DEVICE MANAGEMENT  Remote monitoring is used to monitor your ICD from home. This monitoring is scheduled every 91 days by our office. It allows us  to keep an eye on the functioning of your device to ensure it is working properly. You will routinely see your Electrophysiologist annually (more often if necessary). This will appear as a REMOTE check on your MyChart schedule. These are automatic and there is nothing for you to manually do unless otherwise instructed.  You should receive your ID card for your new device in 4-8 weeks. Keep this card with you at all times once received. Consider  wearing a medical alert bracelet or necklace.  Your ICD  may be MRI compatible. This will be discussed at your next office visit/wound check.  You should avoid contact with strong electric or magnetic fields.   Do not use amateur (ham) radio equipment or electric (arc) welding torches. MP3 player headphones with magnets should not be used. Some devices are safe to use if held at least 12 inches (30 cm) from your defibrillator. These include power tools, lawn mowers, and speakers. If you are unsure if something is safe to use, ask your health care provider.  When using your cell phone, hold it to the ear that is on the opposite side from the defibrillator. Do not leave your cell phone in a pocket over the defibrillator.  You may safely use electric blankets, heating pads, computers, and microwave ovens.  Call the office right away if: You have chest pain. You feel more than one shock. You feel more short of breath than you have felt before. You feel more light-headed than you have felt before. Your incision starts to open up.  This information is not intended to replace advice given to you by your health care provider. Make sure you discuss any questions you have with your health care provider.

## 2024-04-03 NOTE — H&P (Signed)
" °  Electrophysiology Office Note:   Date:  04/03/2024  ID:  Tommy Rowe, DOB 06-Nov-1955, MRN [id number]  Primary Cardiologist: Cara JONETTA Lovelace, MD Primary Heart Failure: Ria Commander, DO Electrophysiologist: Soyla Gladis Norton, MD      History of Present Illness:   Tommy Rowe is a 69 y.o. male with h/o atrial fibrillation/flutter, coronary artery disease post LAD stent, hypertension, hyperlipidemia, left bundle branch block, hypothyroidism seen today for routine electrophysiology follow-up s/p Ablation.  Today, denies symptoms of palpitations, chest pain, dyspnea, orthopnea, PND, lower extremity edema, claudication, dizziness, presyncope, syncope, bleeding, or neurologic sequela. The patient is tolerating medications without difficulties. Plan CRT-D implant today.   EP Information / Studies Reviewed:    EKG is ordered today. Personal review as below.        Risk Assessment/Calculations:    CHA2DS2-VASc Score = 4   This indicates a 4.8% annual risk of stroke. The patient's score is based upon: CHF History: 1 HTN History: 1 Diabetes History: 0 Stroke History: 0 Vascular Disease History: 1 Age Score: 1 Gender Score: 0            Physical Exam:   VS:  BP 115/76   Pulse 65   Temp 98 F (36.7 C) (Oral)   SpO2 98%    Wt Readings from Last 3 Encounters:  03/20/24 89.8 kg  02/25/24 89.4 kg  02/06/24 87.3 kg    GEN: Well nourished, well developed in no acute distress NECK: No JVD; No carotid bruits CARDIAC: Regular rate and rhythm, no murmurs, rubs, gallops RESPIRATORY:  Clear to auscultation without rales, wheezing or rhonchi  ABDOMEN: Soft, non-tender, non-distended EXTREMITIES:  No edema; No deformity    ASSESSMENT AND PLAN:    1.  Chronic systolic heart failure: Tommy Rowe has presented today for surgery, with the diagnosis of CHV.  The various methods of treatment have been discussed with the patient and family. After consideration of risks,  benefits and other options for treatment, the patient has consented to  Procedure(s): CRT-D implant as a surgical intervention .  Risks include but not limited to bleeding, infection, pneumothorax, perforation, tamponade, vascular damage, renal failure, MI, stroke, death, and lead dislodgement . The patient's history has been reviewed, patient examined, no change in status, stable for surgery.  I have reviewed the patient's chart and labs.  Questions were answered to the patient's satisfaction.    Tommy Sookdeo Norton, MD 04/03/2024 12:24 PM  "

## 2024-04-04 ENCOUNTER — Encounter (HOSPITAL_COMMUNITY): Payer: Self-pay | Admitting: Cardiology

## 2024-04-06 MED FILL — Midazolam HCl Inj 2 MG/2ML (Base Equivalent): INTRAMUSCULAR | Qty: 3 | Status: AC

## 2024-04-15 ENCOUNTER — Ambulatory Visit: Payer: Self-pay | Admitting: Cardiology

## 2024-04-15 ENCOUNTER — Ambulatory Visit: Attending: Cardiology

## 2024-04-15 DIAGNOSIS — I447 Left bundle-branch block, unspecified: Secondary | ICD-10-CM | POA: Diagnosis present

## 2024-04-15 DIAGNOSIS — I5022 Chronic systolic (congestive) heart failure: Secondary | ICD-10-CM | POA: Diagnosis present

## 2024-04-15 LAB — CUP PACEART INCLINIC DEVICE CHECK
Date Time Interrogation Session: 20260114123506
Implantable Lead Connection Status: 753985
Implantable Lead Connection Status: 753985
Implantable Lead Connection Status: 753985
Implantable Lead Implant Date: 20260102
Implantable Lead Implant Date: 20260102
Implantable Lead Implant Date: 20260102
Implantable Lead Location: 753859
Implantable Lead Location: 753860
Implantable Lead Location: 753860
Implantable Lead Model: 3830
Implantable Lead Model: 5076
Implantable Pulse Generator Implant Date: 20260102

## 2024-04-15 NOTE — Patient Instructions (Signed)
" °  After Your ICD (Implantable Cardiac Defibrillator)   Monitor your defibrillator site for redness, swelling, and drainage. Call the device clinic at [phone] if you experience these symptoms or fever/chills.  Your incision was closed with Steri-strips or staples:  You may shower 7 days after your procedure and wash your incision with soap and water . Avoid lotions, ointments, or perfumes over your incision until it is well-healed.  You may use a hot tub or a pool after your wound check appointment if the incision is completely closed.  Do not lift, push or pull greater than 10 pounds with the affected arm until FEBRUARY 13th. There are no other restrictions in arm movement after your wound check appointment.  Your ICD is designed to protect you from life threatening heart rhythms. Because of this, you may receive a shock.   1 shock with no symptoms:  Call the office during business hours. 1 shock with symptoms (chest pain, chest pressure, dizziness, lightheadedness, shortness of breath, overall feeling unwell):  Call 911. If you experience 2 or more shocks in 24 hours:  Call 911. If you receive a shock, you should not drive.  [place] DMV - no driving for 6 months if you receive appropriate therapy from your ICD.   ICD Alerts:  Some alerts are vibratory and others beep. These are NOT emergencies. Please call our office to let us  know. If this occurs at night or on weekends, it can wait until the next business day. Send a remote transmission.  If your device is capable of reading fluid status (for heart failure), you will be offered monthly monitoring to review this with you.   Remote monitoring is used to monitor your ICD from home. This monitoring is scheduled every 91 days by our office. It allows us  to keep an eye on the functioning of your device to ensure it is working properly. You will routinely see your Electrophysiologist annually (more often if necessary).  "

## 2024-04-15 NOTE — Progress Notes (Signed)
 Normal multi chamber ICD wound check. Wound well healed. Presenting rhythm: AS/BP 78. Routine testing performed. Thresholds, sensing, and impedance consistent with implant measurements. No treated arrhythmias. Reviewed arm restrictions to continue for 6 weeks total post op. Reviewed shock plan.  Pt enrolled in remote follow-up.

## 2024-04-21 ENCOUNTER — Other Ambulatory Visit

## 2024-04-21 ENCOUNTER — Other Ambulatory Visit: Payer: Self-pay

## 2024-04-21 DIAGNOSIS — E0590 Thyrotoxicosis, unspecified without thyrotoxic crisis or storm: Secondary | ICD-10-CM

## 2024-04-22 LAB — COMPREHENSIVE METABOLIC PANEL WITH GFR
AG Ratio: 1.6 (calc) (ref 1.0–2.5)
ALT: 24 U/L (ref 9–46)
AST: 26 U/L (ref 10–35)
Albumin: 4.6 g/dL (ref 3.6–5.1)
Alkaline phosphatase (APISO): 86 U/L (ref 35–144)
BUN: 24 mg/dL (ref 7–25)
CO2: 28 mmol/L (ref 20–32)
Calcium: 9.1 mg/dL (ref 8.6–10.3)
Chloride: 103 mmol/L (ref 98–110)
Creat: 0.81 mg/dL (ref 0.70–1.35)
Globulin: 2.8 g/dL (ref 1.9–3.7)
Glucose, Bld: 102 mg/dL — ABNORMAL HIGH (ref 65–99)
Potassium: 4 mmol/L (ref 3.5–5.3)
Sodium: 137 mmol/L (ref 135–146)
Total Bilirubin: 0.7 mg/dL (ref 0.2–1.2)
Total Protein: 7.4 g/dL (ref 6.1–8.1)
eGFR: 97 mL/min/1.73m2

## 2024-04-22 LAB — TSH: TSH: 37.27 m[IU]/L — ABNORMAL HIGH (ref 0.40–4.50)

## 2024-04-22 LAB — T3, FREE: T3, Free: 2.3 pg/mL (ref 2.3–4.2)

## 2024-04-22 LAB — T4, FREE: Free T4: 0.5 ng/dL — ABNORMAL LOW (ref 0.8–1.8)

## 2024-04-23 ENCOUNTER — Ambulatory Visit: Payer: Self-pay | Admitting: "Endocrinology

## 2024-04-26 ENCOUNTER — Other Ambulatory Visit (HOSPITAL_COMMUNITY): Payer: Self-pay | Admitting: Internal Medicine

## 2024-04-28 ENCOUNTER — Ambulatory Visit: Admitting: "Endocrinology

## 2024-04-29 ENCOUNTER — Other Ambulatory Visit: Payer: Self-pay | Admitting: "Endocrinology

## 2024-04-29 DIAGNOSIS — E0590 Thyrotoxicosis, unspecified without thyrotoxic crisis or storm: Secondary | ICD-10-CM

## 2024-05-05 ENCOUNTER — Encounter: Admitting: "Endocrinology

## 2024-05-05 NOTE — Progress Notes (Signed)
 CANNOT DO TELEVISIT, WILL RESCHEDULED FOR IN-PERSON VISIT

## 2024-05-07 ENCOUNTER — Encounter: Payer: Self-pay | Admitting: "Endocrinology

## 2024-05-07 ENCOUNTER — Other Ambulatory Visit

## 2024-05-07 ENCOUNTER — Ambulatory Visit: Admitting: "Endocrinology

## 2024-05-07 ENCOUNTER — Other Ambulatory Visit: Payer: Self-pay

## 2024-05-07 VITALS — BP 120/80 | HR 72 | Ht 72.0 in | Wt 203.0 lb

## 2024-05-07 DIAGNOSIS — E0590 Thyrotoxicosis, unspecified without thyrotoxic crisis or storm: Secondary | ICD-10-CM

## 2024-05-07 NOTE — Progress Notes (Addendum)
 "   Outpatient Endocrinology Note Tommy Birmingham, MD  [birth year]   DEARL RUDDEN [birth year] [id number]  Referring Provider: Darra Hamilton, PA-C Primary Care Provider: Darra Hamilton, PA-C Subjective  No chief complaint on file.   Assessment & Plan  Diagnoses and all orders for this visit:  Hyperthyroidism -     TSH -     T3, free -     T4, free -     CBC   Tommy Rowe is currently taking methimazole  10 mg qd Patient is biochemically hypothyroid.  Discussed the etiology for hyperthyroidism. Educated on thyroid  axis.  02/25/24: Recommend the following: methimazole  10 mg qd. Repeat labs in 3 months or sooner if symptoms of hyper or hypothyroidism develop.  -complications of untreated hyperthyroidism including atrial fibrillation, heart failure and osteoporosis -side effects of Methimazole  including but not limited to allergic reaction, rash, bone marrow suppression, liver dysfunction -compliance and follow up needs    If you notice any symptoms of worsening fatigue, fever with sore throat, loss of appetite, yellowing of eyes, dark urine, joint pains, sores in the mouth, itchy rash, light colored stools or abdominal pain, please stop the medication and call us  immediately as this can be a serious side effect of the medication.  09/23/23 THYROID  ULTRASOUND images and report reviewed, within normal limits No follow-up indicated unless clinically warranted  I have reviewed current medications, nurse's notes, allergies, vital signs, past medical and surgical history, family medical history, and social history for this encounter. Counseled patient on symptoms, examination findings, lab findings, imaging results, treatment decisions and monitoring and prognosis. The patient understood the recommendations and agrees with the treatment plan. All questions regarding treatment plan were fully answered.  Return in about 2 months (around 07/05/2024) for visit + labs before next visit, labs  today.   Tommy Birmingham, MD  [birth year]   I have reviewed current medications, nurse's notes, allergies, vital signs, past medical and surgical history, family medical history, and social history for this encounter. Counseled patient on symptoms, examination findings, lab findings, imaging results, treatment decisions and monitoring and prognosis. The patient understood the recommendations and agrees with the treatment plan. All questions regarding treatment plan were fully answered.   History of Present Illness Tommy Rowe is a 69 y.o. year old male who presents to our clinic with hyperthyroidism diagnosed in 2025.    Was in hospital for Atrial fibrillation s/p cardioversion and amiodarone  use, leading to thyroid  storm. He finally had atrial ablation. He is no longer on amiodarone .  He is currently taking methimazole  5 mg tid.  Symptoms suggestive of HYPOTHYROIDISM:  fatigue Yes, occasionally  weight gain Yes cold intolerance  Yes constipation  No  Symptoms suggestive of HYPERTHYROIDISM:  weight loss  No heat intolerance No hyperdefecation  No palpitations  No  Compressive symptoms:  dysphagia  No dysphonia  No positional dyspnea (especially with simultaneous arms elevation)  No  Smokes  No On biotin  No Personal history of head/neck surgery/irradiation  No  Adverse Drug Effects from Methimazole  (MMI): rash No fever No throat pain No arthritis No mouth ulcers No jaundice No loss of appetite No lymphadenopathy No  Grave's Ophthalmopathy Clinical Activity Score: 0/9  09/23/23 THYROID  ULTRASOUND   TECHNIQUE: Ultrasound examination of the thyroid  gland and adjacent soft tissues was performed.   COMPARISON:  None Available.   FINDINGS: Parenchymal Echotexture: Normal   Isthmus: 0.5 cm   Right lobe: 4.6 x 2.4 x 1.8 cm   Left  lobe: 5.2 x 1.5 x 1.8 cm   _________________________________________________________   Estimated total number of nodules >/= 1 cm:  0   Number of spongiform nodules >/=  2 cm not described below (TR1): 0   Number of mixed cystic and solid nodules >/= 1.5 cm not described below (TR2): 0   _________________________________________________________   No discrete nodules are seen within the thyroid  gland. No enlarged or abnormal appearing lymph nodes are identified.   IMPRESSION: Normal thyroid  ultrasound.  Physical Exam  BP 120/80   Pulse 72   Ht 6' (1.829 m)   Wt 203 lb (92.1 kg)   SpO2 97%   BMI 27.53 kg/m  Constitutional: well developed, well nourished Head: normocephalic, atraumatic, no exophthalmos Eyes: sclera anicteric, no redness Neck: no thyromegaly, no thyroid  tenderness; no nodules palpated Lungs: normal respiratory effort Neurology: alert and oriented, no fine hand tremor Skin: dry, no appreciable rashes Musculoskeletal: no appreciable defects Psychiatric: normal mood and affect  Allergies Allergies  Allergen Reactions   Amiodarone  Other (See Comments)    History of Graves Disease    Current Medications Patient's Medications  New Prescriptions   No medications on file  Previous Medications   DESONIDE (DESOWEN) 0.05 % OINTMENT    Apply 1 Application topically daily as needed (irritations).   DOFETILIDE  (TIKOSYN ) 125 MCG CAPSULE    TAKE 1 CAPSULE (125 MCG TOTAL) BY MOUTH 2 (TWO) TIMES DAILY.   DOXYLAMINE , SLEEP, (UNISOM ) 25 MG TABLET    Take 0.75 mg by mouth at bedtime.   ELIQUIS  5 MG TABS TABLET    Take 1 tablet (5 mg total) by mouth 2 (two) times daily.   EMPAGLIFLOZIN  (JARDIANCE ) 10 MG TABS TABLET    Take 1 tablet (10 mg total) by mouth daily.   ENTRESTO  24-26 MG    Take 1 tablet by mouth 2 (two) times daily.   METHIMAZOLE  (TAPAZOLE ) 10 MG TABLET    10 mg in morning and 10 mg at night   METOPROLOL  SUCCINATE (TOPROL  XL) 25 MG 24 HR TABLET    Take 1 tablet (25 mg total) by mouth daily.   MULTIPLE VITAMIN (MULTIVITAMIN WITH MINERALS) TABS TABLET    Take 1 tablet by mouth every morning.  Centrum Silver for Men 50+   NITROGLYCERIN  (NITROSTAT ) 0.4 MG SL TABLET    Place 1 tablet (0.4 mg total) under the tongue every 5 (five) minutes as needed for chest pain.   ROSUVASTATIN  (CRESTOR ) 20 MG TABLET    Take 20 mg by mouth at bedtime.   SERTRALINE  (ZOLOFT ) 50 MG TABLET    Take 1 tablet (50 mg total) by mouth at bedtime.   SILDENAFIL  (VIAGRA ) 100 MG TABLET    Take 100 mg by mouth as needed for erectile dysfunction.   TAMSULOSIN  (FLOMAX ) 0.4 MG CAPS CAPSULE    Take 1 capsule (0.4 mg total) by mouth daily.  Modified Medications   No medications on file  Discontinued Medications   No medications on file    Past Medical History Past Medical History:  Diagnosis Date   A-fib (HCC)    Anxiety    CAD (coronary artery disease)    HFrEF (heart failure with reduced ejection fraction) (HCC)    HLD (hyperlipidemia)    HTN (hypertension)    Hyperthyroidism    LBBB (left bundle branch block)     Past Surgical History Past Surgical History:  Procedure Laterality Date   A-FLUTTER ABLATION N/A 10/03/2023   Procedure: A-FLUTTER ABLATION;  Surgeon: Inocencio,  Soyla Lunger, MD;  Location: [facility];  Service: Cardiovascular;  Laterality: N/A;   ATRIAL FIBRILLATION ABLATION N/A 10/03/2023   Procedure: ATRIAL FIBRILLATION ABLATION;  Surgeon: Inocencio Soyla Lunger, MD;  Location: [facility];  Service: Cardiovascular;  Laterality: N/A;   BIV ICD INSERTION CRT-D N/A 04/03/2024   Procedure: BIV ICD INSERTION CRT-D;  Surgeon: Inocencio Soyla Lunger, MD;  Location: [facility] INVASIVE [facility];  Service: Cardiovascular;  Laterality: N/A;   CORONARY STENT INTERVENTION N/A 09/18/2021   Procedure: CORONARY STENT INTERVENTION;  Surgeon: Florencio Cara BIRCH, MD;  Location: [facility] INVASIVE CV LAB;  Service: Cardiovascular;  Laterality: N/A;   FACIAL FRACTURE SURGERY  1980   LEAD INSERTION N/A 04/03/2024   Procedure: LEAD INSERTION;  Surgeon: Inocencio Soyla Lunger, MD;  Location: [facility];  Service:  Cardiovascular;  Laterality: N/A;   LEFT HEART CATH AND CORONARY ANGIOGRAPHY Left 04/11/2021   Procedure: LEFT HEART CATH AND CORONARY ANGIOGRAPHY;  Surgeon: Fernand Denyse LABOR, MD;  Location: [facility] INVASIVE CV [facility];  Service: Cardiovascular;  Laterality: Left;   LEFT HEART CATH AND CORONARY ANGIOGRAPHY N/A 09/18/2021   Procedure: LEFT HEART CATH AND CORONARY ANGIOGRAPHY;  Surgeon: Florencio Cara BIRCH, MD;  Location: [facility] INVASIVE CV [facility];  Service: Cardiovascular;  Laterality: N/A;   LUMBAR LAMINECTOMY/DECOMPRESSION MICRODISCECTOMY N/A 08/26/2019   Procedure: RIGHT L4-5 MICRODISCECTOMY;  Surgeon: Clois Fret, MD;  Location: [facility];  Service: Neurosurgery;  Laterality: N/A;   RIGHT/LEFT HEART CATH AND CORONARY ANGIOGRAPHY N/A 01/20/2024   Procedure: RIGHT/LEFT HEART CATH AND CORONARY ANGIOGRAPHY;  Surgeon: Mady Bruckner, MD;  Location: [facility];  Service: Cardiovascular;  Laterality: N/A;    Family History family history includes COPD in his mother; Heart attack in his father; Valvular heart disease in his sister.  Social History Social History   Socioeconomic History   Marital status: Married    Spouse name: Not on file   Number of children: Not on file   Years of education: Not on file   Highest education level: Not on file  Occupational History   Not on file  Tobacco Use   Smoking status: Former    Current packs/day: 0.00    Types: Cigarettes    Quit date: 04/02/1988    Years since quitting: 36.1    Passive exposure: Past   Smokeless tobacco: Never  Vaping Use   Vaping status: Not on file  Substance and Sexual Activity   Alcohol use: No   Drug use: Never   Sexual activity: Not on file  Other Topics Concern   Not on file  Social History Narrative   Not on file   Social Drivers of Health   Tobacco Use: Medium Risk (05/07/2024)   Patient History    Smoking Tobacco Use: Former    Smokeless Tobacco Use: Never    Passive Exposure: Past  Physicist, Medical  Strain: Not on file  Food Insecurity: No Food Insecurity (09/23/2023)   Epic    Worried About Programme Researcher, Broadcasting/film/video in the Last Year: Never true    Ran Out of Food in the Last Year: Never true  Transportation Needs: No Transportation Needs (09/23/2023)   Epic    Lack of Transportation (Medical): No    Lack of Transportation (Non-Medical): No  Physical Activity: Not on file  Stress: Not on file  Social Connections: Moderately Integrated (09/23/2023)   Social Connection and Isolation Panel    Frequency of Communication with Friends and Family: More than  three times a week    Frequency of Social Gatherings with Friends and Family: More than three times a week    Attends Religious Services: More than 4 times per year    Active Member of Clubs or Organizations: No    Attends Banker Meetings: Never    Marital Status: Married  Catering Manager Violence: Not At Risk (09/23/2023)   Epic    Fear of Current or Ex-Partner: No    Emotionally Abused: No    Physically Abused: No    Sexually Abused: No  Depression (PHQ2-9): Low Risk (03/12/2022)   Depression (PHQ2-9)    PHQ-2 Score: 2  Alcohol Screen: Not on file  Housing: Low Risk (09/23/2023)   Epic    Unable to Pay for Housing in the Last Year: No    Number of Times Moved in the Last Year: 0    Homeless in the Last Year: No  Utilities: Not At Risk (09/23/2023)   Epic    Threatened with loss of utilities: No  Health Literacy: Not on file    Laboratory Investigations Lab Results  Component Value Date   TSH 37.27 (H) 04/21/2024   TSH 30.81 (H) 02/20/2024   TSH <0.01 (L) 11/20/2023   FREET4 0.5 (L) 04/21/2024   FREET4 0.5 (L) 02/20/2024   FREET4 1.6 11/20/2023     Lab Results  Component Value Date   TSI 102 10/10/2023     No components found for: TRAB   Lab Results  Component Value Date   CHOL 109 09/17/2021   Lab Results  Component Value Date   HDL 49 09/17/2021   Lab Results  Component Value Date   LDLCALC 55  09/17/2021   Lab Results  Component Value Date   TRIG 23 09/17/2021   Lab Results  Component Value Date   CHOLHDL 2.2 09/17/2021   Lab Results  Component Value Date   CREATININE 0.81 04/21/2024   No results found for: GFR    Component Value Date/Time   NA 137 04/21/2024 0907   NA 137 01/16/2024 1449   K 4.0 04/21/2024 0907   CL 103 04/21/2024 0907   CO2 28 04/21/2024 0907   GLUCOSE 102 (H) 04/21/2024 0907   BUN 24 04/21/2024 0907   BUN 21 01/16/2024 1449   CREATININE 0.81 04/21/2024 0907   CALCIUM  9.1 04/21/2024 0907   PROT 7.4 04/21/2024 0907   ALBUMIN 2.9 (L) 09/26/2023 0323   AST 26 04/21/2024 0907   ALT 24 04/21/2024 0907   ALKPHOS 36 (L) 09/26/2023 0323   BILITOT 0.7 04/21/2024 0907   GFRNONAA >60 03/20/2024 0949   GFRAA >60 08/21/2019 1017      Latest Ref Rng & Units 04/21/2024    9:07 AM 03/20/2024    9:49 AM 01/20/2024   11:18 AM  BMP  Glucose 65 - 99 mg/dL 897  894    BUN 7 - 25 mg/dL 24  29    Creatinine 9.29 - 1.35 mg/dL 9.18  9.21    BUN/Creat Ratio 6 - 22 (calc) SEE NOTE:     Sodium 135 - 146 mmol/L 137  138  139   Potassium 3.5 - 5.3 mmol/L 4.0  5.0  3.9   Chloride 98 - 110 mmol/L 103  104    CO2 20 - 32 mmol/L 28  26    Calcium  8.6 - 10.3 mg/dL 9.1  9.1         Component Value Date/Time   WBC  4.6 03/20/2024 0949   RBC 4.92 03/20/2024 0949   HGB 16.1 03/20/2024 0949   HGB 15.7 01/16/2024 1449   HCT 47.0 03/20/2024 0949   HCT 46.5 01/16/2024 1449   PLT 147 (L) 03/20/2024 0949   PLT 143 (L) 01/16/2024 1449   MCV 95.5 03/20/2024 0949   MCV 94 01/16/2024 1449   MCH 32.7 03/20/2024 0949   MCHC 34.3 03/20/2024 0949   RDW 13.6 03/20/2024 0949   RDW 13.1 01/16/2024 1449   LYMPHSABS 1.4 09/18/2023 1700   MONOABS 0.5 09/18/2023 1700   EOSABS 0.0 09/18/2023 1700   BASOSABS 0.0 09/18/2023 1700      Parts of this note may have been dictated using voice recognition software. There may be variances in spelling and vocabulary which are  unintentional. Not all errors are proofread. Please notify the dino if any discrepancies are noted or if the meaning of any statement is not clear.    "

## 2024-05-08 LAB — CBC
HCT: 44.9 % (ref 39.4–51.1)
Hemoglobin: 15.4 g/dL (ref 13.2–17.1)
MCH: 32.8 pg (ref 27.0–33.0)
MCHC: 34.3 g/dL (ref 31.6–35.4)
MCV: 95.7 fL (ref 81.4–101.7)
MPV: 11.7 fL (ref 7.5–12.5)
Platelets: 100 10*3/uL — ABNORMAL LOW (ref 140–400)
RBC: 4.69 Million/uL (ref 4.20–5.80)
RDW: 12.5 % (ref 11.0–15.0)
WBC: 5.2 10*3/uL (ref 3.8–10.8)

## 2024-05-08 LAB — T3, FREE: T3, Free: 2.9 pg/mL (ref 2.3–4.2)

## 2024-05-08 LAB — T4, FREE: Free T4: 0.8 ng/dL (ref 0.8–1.8)

## 2024-05-08 LAB — TSH: TSH: 17.01 m[IU]/L — ABNORMAL HIGH (ref 0.40–4.50)

## 2024-05-15 ENCOUNTER — Ambulatory Visit

## 2024-07-09 ENCOUNTER — Ambulatory Visit: Admitting: Cardiology

## 2024-07-16 ENCOUNTER — Other Ambulatory Visit

## 2024-07-20 ENCOUNTER — Ambulatory Visit: Admitting: "Endocrinology

## 2024-08-05 ENCOUNTER — Ambulatory Visit: Admitting: Physician Assistant

## 2024-08-14 ENCOUNTER — Ambulatory Visit

## 2024-09-02 ENCOUNTER — Ambulatory Visit: Admitting: Urology

## 2024-09-03 ENCOUNTER — Ambulatory Visit: Admitting: Urology

## 2024-11-13 ENCOUNTER — Ambulatory Visit

## 2025-02-12 ENCOUNTER — Ambulatory Visit
